# Patient Record
Sex: Female | Born: 1960 | Race: Black or African American | Hispanic: No | Marital: Single | Smoking: Former smoker
Health system: Southern US, Community
[De-identification: ages and names within clinical notes are randomized; demographics above are authoritative.]

## PROBLEM LIST (undated history)

## (undated) DIAGNOSIS — J449 Chronic obstructive pulmonary disease, unspecified: Secondary | ICD-10-CM

## (undated) DIAGNOSIS — I509 Heart failure, unspecified: Secondary | ICD-10-CM

## (undated) DIAGNOSIS — R911 Solitary pulmonary nodule: Secondary | ICD-10-CM

## (undated) DIAGNOSIS — I351 Nonrheumatic aortic (valve) insufficiency: Secondary | ICD-10-CM

## (undated) DIAGNOSIS — E213 Hyperparathyroidism, unspecified: Secondary | ICD-10-CM

## (undated) DIAGNOSIS — G629 Polyneuropathy, unspecified: Secondary | ICD-10-CM

## (undated) DIAGNOSIS — F32A Depression, unspecified: Secondary | ICD-10-CM

## (undated) DIAGNOSIS — I2510 Atherosclerotic heart disease of native coronary artery without angina pectoris: Secondary | ICD-10-CM

## (undated) DIAGNOSIS — I10 Essential (primary) hypertension: Secondary | ICD-10-CM

## (undated) DIAGNOSIS — E042 Nontoxic multinodular goiter: Secondary | ICD-10-CM

## (undated) DIAGNOSIS — E079 Disorder of thyroid, unspecified: Secondary | ICD-10-CM

## (undated) DIAGNOSIS — B1920 Unspecified viral hepatitis C without hepatic coma: Secondary | ICD-10-CM

## (undated) DIAGNOSIS — I340 Nonrheumatic mitral (valve) insufficiency: Secondary | ICD-10-CM

## (undated) HISTORY — PX: PARATHYROIDECTOMY: SHX19

## (undated) HISTORY — PX: TUBAL LIGATION: SHX77

---

## 2006-01-22 ENCOUNTER — Inpatient Hospital Stay: Payer: Self-pay | Admitting: Otolaryngology

## 2007-06-02 ENCOUNTER — Emergency Department: Payer: Self-pay | Admitting: Emergency Medicine

## 2007-06-02 ENCOUNTER — Other Ambulatory Visit: Payer: Self-pay

## 2007-06-13 ENCOUNTER — Ambulatory Visit: Payer: Self-pay | Admitting: General Practice

## 2009-12-18 ENCOUNTER — Ambulatory Visit: Payer: Self-pay | Admitting: Cardiovascular Disease

## 2010-01-07 ENCOUNTER — Ambulatory Visit: Payer: Self-pay | Admitting: Internal Medicine

## 2010-01-13 ENCOUNTER — Ambulatory Visit: Payer: Self-pay | Admitting: Internal Medicine

## 2010-03-09 ENCOUNTER — Emergency Department: Payer: Self-pay | Admitting: Unknown Physician Specialty

## 2011-03-07 ENCOUNTER — Emergency Department: Payer: Self-pay | Admitting: Emergency Medicine

## 2012-11-13 ENCOUNTER — Emergency Department: Payer: Self-pay | Admitting: Emergency Medicine

## 2013-09-21 ENCOUNTER — Inpatient Hospital Stay: Payer: Self-pay | Admitting: Internal Medicine

## 2013-09-21 LAB — DRUG SCREEN, URINE
Amphetamines, Ur Screen: NEGATIVE (ref ?–1000)
Benzodiazepine, Ur Scrn: NEGATIVE (ref ?–200)
Cannabinoid 50 Ng, Ur ~~LOC~~: NEGATIVE (ref ?–50)
Cocaine Metabolite,Ur ~~LOC~~: POSITIVE (ref ?–300)
MDMA (Ecstasy)Ur Screen: NEGATIVE (ref ?–500)
Opiate, Ur Screen: POSITIVE (ref ?–300)
Phencyclidine (PCP) Ur S: NEGATIVE (ref ?–25)
Tricyclic, Ur Screen: NEGATIVE (ref ?–1000)

## 2013-09-21 LAB — CBC WITH DIFFERENTIAL/PLATELET
Basophil #: 0.1 10*3/uL (ref 0.0–0.1)
Eosinophil #: 0.1 10*3/uL (ref 0.0–0.7)
Eosinophil %: 0.9 %
HCT: 42.4 % (ref 35.0–47.0)
Lymphocyte %: 15 %
MCH: 26.4 pg (ref 26.0–34.0)
Monocyte %: 5.8 %
Neutrophil %: 77.3 %
WBC: 7.3 10*3/uL (ref 3.6–11.0)

## 2013-09-21 LAB — COMPREHENSIVE METABOLIC PANEL
Anion Gap: 5 — ABNORMAL LOW (ref 7–16)
BUN: 14 mg/dL (ref 7–18)
Bilirubin,Total: 0.5 mg/dL (ref 0.2–1.0)
Chloride: 109 mmol/L — ABNORMAL HIGH (ref 98–107)
Co2: 26 mmol/L (ref 21–32)
Creatinine: 0.84 mg/dL (ref 0.60–1.30)
EGFR (African American): >60
EGFR (Non-African Amer.): >60
Osmolality: 282 (ref 275–301)
Potassium: 3.7 mmol/L (ref 3.5–5.1)
SGOT(AST): 16 U/L (ref 15–37)

## 2013-09-22 LAB — CBC WITH DIFFERENTIAL/PLATELET
Eosinophil %: 0.1 %
HCT: 36.7 % (ref 35.0–47.0)
HGB: 11.5 g/dL — ABNORMAL LOW (ref 12.0–16.0)
Lymphocyte #: 1.4 10*3/uL (ref 1.0–3.6)
MCH: 25.5 pg — ABNORMAL LOW (ref 26.0–34.0)
MCHC: 31.2 g/dL — ABNORMAL LOW (ref 32.0–36.0)
Monocyte #: 0.8 x10 3/mm (ref 0.2–0.9)
Neutrophil %: 74.4 %
Platelet: 213 10*3/uL (ref 150–440)
RBC: 4.49 10*6/uL (ref 3.80–5.20)
WBC: 8.5 10*3/uL (ref 3.6–11.0)

## 2013-09-22 LAB — TSH: Thyroid Stimulating Horm: 0.856 u[IU]/mL

## 2013-09-22 LAB — BASIC METABOLIC PANEL
Calcium, Total: 9.7 mg/dL (ref 8.5–10.1)
Co2: 26 mmol/L (ref 21–32)
Creatinine: 0.77 mg/dL (ref 0.60–1.30)
Glucose: 112 mg/dL — ABNORMAL HIGH (ref 65–99)
Osmolality: 280 (ref 275–301)
Potassium: 3.8 mmol/L (ref 3.5–5.1)

## 2013-09-23 LAB — SEDIMENTATION RATE: Erythrocyte Sed Rate: 20 mm/hr (ref 0–30)

## 2013-09-24 LAB — BASIC METABOLIC PANEL
BUN: 9 mg/dL (ref 7–18)
Chloride: 111 mmol/L — ABNORMAL HIGH (ref 98–107)
Co2: 27 mmol/L (ref 21–32)
Creatinine: 0.72 mg/dL (ref 0.60–1.30)
EGFR (Non-African Amer.): >60
Osmolality: 280 (ref 275–301)
Potassium: 3.9 mmol/L (ref 3.5–5.1)

## 2013-09-26 LAB — CULTURE, BLOOD (SINGLE)

## 2014-02-06 ENCOUNTER — Ambulatory Visit: Payer: Self-pay | Admitting: Internal Medicine

## 2014-02-17 ENCOUNTER — Ambulatory Visit: Payer: Self-pay | Admitting: Internal Medicine

## 2014-02-22 ENCOUNTER — Emergency Department: Payer: Self-pay | Admitting: Emergency Medicine

## 2014-02-25 LAB — PATHOLOGY REPORT

## 2014-12-26 ENCOUNTER — Emergency Department (HOSPITAL_COMMUNITY): Payer: Medicaid Other

## 2014-12-26 ENCOUNTER — Encounter (HOSPITAL_COMMUNITY): Payer: Self-pay | Admitting: Emergency Medicine

## 2014-12-26 ENCOUNTER — Emergency Department (HOSPITAL_COMMUNITY)
Admission: EM | Admit: 2014-12-26 | Discharge: 2014-12-26 | Disposition: A | Discharge status: Home / Self Care | Payer: Medicaid Other | Attending: Emergency Medicine | Admitting: Emergency Medicine

## 2014-12-26 DIAGNOSIS — J441 Chronic obstructive pulmonary disease with (acute) exacerbation: Secondary | ICD-10-CM | POA: Diagnosis not present

## 2014-12-26 DIAGNOSIS — R05 Cough: Secondary | ICD-10-CM | POA: Diagnosis present

## 2014-12-26 DIAGNOSIS — I10 Essential (primary) hypertension: Secondary | ICD-10-CM | POA: Insufficient documentation

## 2014-12-26 DIAGNOSIS — Z720 Tobacco use: Secondary | ICD-10-CM | POA: Diagnosis not present

## 2014-12-26 HISTORY — DX: Essential (primary) hypertension: I10

## 2014-12-26 HISTORY — DX: Chronic obstructive pulmonary disease, unspecified: J44.9

## 2014-12-26 LAB — I-STAT TROPONIN, ED: TROPONIN I, POC: 0 ng/mL (ref 0.00–0.08)

## 2014-12-26 MED ORDER — AMLODIPINE BESYLATE 5 MG PO TABS: 5.0000 mg | ORAL_TABLET | Freq: Once | ORAL | Status: DC

## 2014-12-26 MED ORDER — AMLODIPINE BESYLATE 5 MG PO TABS
5.0000 mg | ORAL_TABLET | Freq: Once | ORAL | Status: AC
  Administered 2014-12-26: 5 mg via ORAL
  Filled 2014-12-26: qty 1

## 2014-12-26 MED ORDER — PREDNISONE 20 MG PO TABS: 60.0000 mg | ORAL_TABLET | Freq: Every day | ORAL | Status: DC

## 2014-12-26 MED ORDER — AZITHROMYCIN 250 MG PO TABS
500.0000 mg | ORAL_TABLET | Freq: Once | ORAL | Status: AC
  Administered 2014-12-26: 500 mg via ORAL
  Filled 2014-12-26: qty 2

## 2014-12-26 MED ORDER — PREDNISONE 20 MG PO TABS
60.0000 mg | ORAL_TABLET | Freq: Once | ORAL | Status: AC
  Administered 2014-12-26: 60 mg via ORAL
  Filled 2014-12-26: qty 3

## 2014-12-26 MED ORDER — AZITHROMYCIN 250 MG PO TABS: 250.0000 mg | ORAL_TABLET | Freq: Every day | ORAL | Status: DC

## 2014-12-26 NOTE — ED Notes (Signed)
Pt arrives with c/o URI ongoing for 3-4 weeks, states cough has become progressively worse that "its affecting my groin." Thick, green sputum. COPD hx.

## 2014-12-26 NOTE — Discharge Instructions (Signed)
Hypertension Cindy Gutierrez, Take z-pack and steroids as prescribed and follow up with a primary care physician within 3 days.  Take amlodipine as well for your high blood pressure.  If symptoms worsen, come back to the ED immediately.  Thank you. Hypertension is another name for high blood pressure. High blood pressure forces your heart to work harder to pump blood. A blood pressure reading has two numbers, which includes a higher number over a lower number (example: 110/72). HOME CARE   Have your blood pressure rechecked by your doctor.  Only take medicine as told by your doctor. Follow the directions carefully. The medicine does not work as well if you skip doses. Skipping doses also puts you at risk for problems.  Do not smoke.  Monitor your blood pressure at home as told by your doctor. GET HELP IF:  You think you are having a reaction to the medicine you are taking.  You have repeat headaches or feel dizzy.  You have puffiness (swelling) in your ankles.  You have trouble with your vision. GET HELP RIGHT AWAY IF:   You get a very bad headache and are confused.  You feel weak, numb, or faint.  You get chest or belly (abdominal) pain.  You throw up (vomit).  You cannot breathe very well. MAKE SURE YOU:   Understand these instructions.  Will watch your condition.  Will get help right away if you are not doing well or get worse. Document Released: 03/07/2008 Document Revised: 09/24/2013 Document Reviewed: 07/12/2013 [facility] Patient Information 2015 [place], [place]. This information is not intended to replace advice given to you by your health care provider. Make sure you discuss any questions you have with your health care provider. Chronic Obstructive Pulmonary Disease Chronic obstructive pulmonary disease (COPD) is a common lung problem. In COPD, the flow of air from the lungs is limited. The way your lungs work will probably never return to normal, but there are things you  can do to improve your lungs and make yourself feel better. HOME CARE  Take all medicines as told by your doctor.  Avoid medicines or cough syrups that dry up your airway (such as antihistamines) and do not allow you to get rid of thick spit. You do not need to avoid them if told differently by your doctor.  If you smoke, stop. Smoking makes the problem worse.  Avoid being around things that make your breathing worse (like smoke, chemicals, and fumes).  Use oxygen therapy and therapy to help improve your lungs (pulmonary rehabilitation) if told by your doctor. If you need home oxygen therapy, ask your doctor if you should buy a tool to measure your oxygen level (oximeter).  Avoid people who have a sickness you can catch (contagious).  Avoid going outside when it is very hot, cold, or humid.  Eat healthy foods. Eat smaller meals more often. Rest before meals.  Stay active, but remember to also rest.  Make sure to get all the shots (vaccines) your doctor recommends. Ask your doctor if you need a pneumonia shot.  Learn and use tips on how to relax.  Learn and use tips on how to control your breathing as told by your doctor. Try:  Breathing in (inhaling) through your nose for 1 second. Then, pucker your lips and breath out (exhale) through your lips for 2 seconds.  Putting one hand on your belly (abdomen). Breathe in slowly through your nose for 1 second. Your hand on your belly should move  out. Pucker your lips and breathe out slowly through your lips. Your hand on your belly should move in as you breathe out.  Learn and use controlled coughing to clear thick spit from your lungs. The steps are:  Lean your head a little forward.  Breathe in deeply.  Try to hold your breath for 3 seconds.  Keep your mouth slightly open while coughing 2 times.  Spit any thick spit out into a tissue.  Rest and do the steps again 1 or 2 times as needed. GET HELP IF:  You cough up more thick  spit than usual.  There is a change in the color or thickness of the spit.  It is harder to breathe than usual.  Your breathing is faster than usual. GET HELP RIGHT AWAY IF:   You have shortness of breath while resting.  You have shortness of breath that stops you from:  Being able to talk.  Doing normal activities.  You chest hurts for longer than 5 minutes.  Your skin color is more blue than usual.  Your pulse oximeter shows that you have low oxygen for longer than 5 minutes. MAKE SURE YOU:   Understand these instructions.  Will watch your condition.  Will get help right away if you are not doing well or get worse. Document Released: 03/07/2008 Document Revised: 02/03/2014 Document Reviewed: 05/16/2013 [facility] Patient Information 2015 [place], [place]. This information is not intended to replace advice given to you by your health care provider. Make sure you discuss any questions you have with your health care provider.

## 2014-12-26 NOTE — ED Provider Notes (Signed)
CSN: [id number]     Arrival date & time 12/26/14  0455 History   First MD Initiated Contact with Patient 12/26/14 [phone]     Chief Complaint  Patient presents with  . Cough     (Consider location/radiation/quality/duration/timing/severity/associated sxs/prior Treatment) HPI [name] is a 54 year old female with a past medical history of hypertension and COPD presented today with chronic cough.  She states her cough has been persistent and worsening for the last 3-4 weeks.  It is productive of thick green sputum.  She has intermittent subj fevers as well.  She denies any chest pain at this time. She has been using her maintenance medication and nebulizer albuterol at home without relief. Patient with no further complaints.   10 Systems reviewed and are negative for acute change except as noted in the HPI.    Past Medical History  Diagnosis Date  . COPD (chronic obstructive pulmonary disease)   . Hypertension    History reviewed. No pertinent past surgical history. No family history on file. History  Substance Use Topics  . Smoking status: Current Every Day Smoker  . Smokeless tobacco: Not on file  . Alcohol Use: Yes   OB History    No data available     Review of Systems    Allergies  Review of patient's allergies indicates no known allergies.  Home Medications   Prior to Admission medications   Not on File   BP 200/72 mmHg  Pulse 87  Temp(Src) 98.9 F (37.2 C) (Oral)  Resp 18  Ht 5\' 10"  (1.778 m)  Wt 165 lb (74.844 kg)  BMI 23.68 kg/m2  SpO2 99% Physical Exam  Constitutional: She is oriented to person, place, and time. She appears well-developed and well-nourished. No distress.  HENT:  Head: Normocephalic and atraumatic.  Nose: Nose normal.  Mouth/Throat: Oropharynx is clear and moist. No oropharyngeal exudate.  Eyes: Conjunctivae and EOM are normal. Pupils are equal, round, and reactive to light. No scleral icterus.  Neck: Normal range of motion. Neck  supple. No JVD present. No tracheal deviation present. No thyromegaly present.  Cardiovascular: Normal rate, regular rhythm and normal heart sounds.  Exam reveals no gallop and no friction rub.   No murmur heard. Pulmonary/Chest: Effort normal and breath sounds normal. No respiratory distress. She has no wheezes. She exhibits no tenderness.  Abdominal: Soft. Bowel sounds are normal. She exhibits no distension and no mass. There is no tenderness. There is no rebound and no guarding.  Musculoskeletal: Normal range of motion. She exhibits no edema or tenderness.  Lymphadenopathy:    She has no cervical adenopathy.  Neurological: She is alert and oriented to person, place, and time. No cranial nerve deficit. She exhibits normal muscle tone.  Skin: Skin is warm and dry. No rash noted. She is not diaphoretic. No erythema. No pallor.  Nursing note and vitals reviewed.   ED Course  Procedures (including critical care time) Labs Review Labs Reviewed  Randolm Idol, ED    Imaging Review Dg Chest 2 View (if Patient Has Fever And/or Copd)  12/26/2014   CLINICAL DATA:  Cough for 3-4 weeks.  History of COPD.  EXAM: CHEST  2 VIEW  COMPARISON:  11/13/2012  FINDINGS: Mild hyperinflation and interstitial prominence. The cardiomediastinal contours are normal. Pulmonary vasculature is normal. No consolidation, pleural effusion, or pneumothorax. No acute osseous abnormalities are seen. Old left-sided rib fracture.  IMPRESSION: Mild hyperinflation and interstitial prominence. This is likely chronic, however has progressed from examination  2 years prior.   Electronically Signed   By: Jeb Levering M.D.   On: 12/26/2014 05:59     EKG Interpretation None      MDM   Final diagnoses:  None   Patient presents to the ED for mild SOB and increaseing and worsening cough. She states this is consistent with her COPD exacerbation.  Will obtain cxr to eval for pneumonia.  Patient was given steroids in the ED.   There is no wheezing on exam.  Her BP is very high and she states this is normal and she does not take meds.  I had a lengthy discussion with her and she agreed tos tart lose dose amlodipine and follow up with a PCP within 3 days for continued management.    CXR does not show any pneumonia.  Will also give azithromycin for COPD exac.  Her VS remain within her normal limits and she is safe for DC.      Everlene Balls, MD 12/26/14 [phone]

## 2015-01-23 NOTE — H&P (Signed)
PATIENT NAME:  Cindy Gutierrez, Cindy Gutierrez MR#:  [id number] DATE OF BIRTH:  [birth year]  DATE OF ADMISSION:  09/21/2013  REFERRING PHYSICIAN: Dr. Marjean Donna.   PRIMARY CARE PHYSICIAN: Dr. Lamonte Sakai.   CHIEF COMPLAINT: Diffuse rash and pain.  HISTORY OF PRESENT ILLNESS: This is a 54 year old African American female with past medical history of chronic obstructive pulmonary disease, hypertension, taking no medications, presenting with diffuse rash. She describes one day duration of diffuse rash. This, she believes,  is secondary to bug bites after riding in her friend's car. She describes this rash as painful, however not pleuritic. She also notes swelling around her right eye without associated eye pain, visual deficits or problems with bright lights. On arrival, she was found to be markedly hypertensive with systolic blood pressures in the 230s. Denies any fevers, chills, no further symptoms.   REVIEW OF SYSTEMS:  CONSTITUTIONAL: Denies fever, fatigue, weakness.  EYES: Denies blurred vision, double vision, eye pain.  ENT: Denies tinnitus, ear pain, hearing loss.  RESPIRATORY: Denies cough, wheeze, shortness of breath.  CARDIOVASCULAR: Denies chest pain, palpitations, edema.  GASTROINTESTINAL: Denies nausea, vomiting, diarrhea, abdominal pain. GENITOURINARY: Denies dysuria, hematuria.  ENDOCRINE: Denies nocturia or thyroid problems.  HEMATOLOGY AND LYMPHATIC: Denies easy bruising or bleeding.  SKIN: Positive for rash described above.  MUSCULOSKELETAL: Complains of diffuse pain, though most prominent in hands bilaterally, the palmar surfaces. Denies any arthritic symptoms.  NEUROLOGIC: Denies paralysis, paresthesias.  PSYCHIATRIC: Denies any anxiety or depressive symptoms.  Otherwise, full review of systems performed by me is negative.   PAST MEDICAL HISTORY: Hypertension, as well as chronic obstructive pulmonary disease.   SOCIAL HISTORY: Every day tobacco use, occasional alcohol usage. Has a  history of IV drug usage, though states not for many years.    FAMILY HISTORY: Positive for diabetes as well as hypertension.   ALLERGIES: PENICILLIN AND SULFA.   HOME MEDICATIONS: None.   PHYSICAL EXAMINATION: VITAL SIGNS: Temperature 98.1, heart rate 84, respirations 18, blood pressure 239/102, saturating 97% on room air. Weight 74.8 kg, BMI 23.7.  GENERAL: Well-nourished, well-developed, African American female, currently in no acute distress.  HEAD: Normocephalic, atraumatic.  EYES: Pupils equal, round and reactive to light. Extraocular movements intact. No scleral icterus. There is marked edema and erythema around the right eye.  EARS, NOSE, AND THROAT: Throat clear, without exudates. No external lesions.  NECK: Supple. No thyromegaly. No nodules. No JVD.  PULMONARY: Clear to auscultation bilaterally. No wheezes, rales, rhonchi. No use of accessory muscles. Good respiratory effort.  CHEST: Nontender to palpation.  CARDIOVASCULAR: S1, S2, regular rate and rhythm. No murmurs, rubs, or gallops. No edema. Pedal pulses 2+ bilaterally.  ABDOMEN: Soft, nontender, nondistended. No masses. Positive bowel sounds. No hepatosplenomegaly.  MUSCULOSKELETAL: No swelling, clubbing, edema. Range of motion full in all extremities.  NEUROLOGIC: Cranial nerves II through XII intact. No gross focal neurological deficits. Sensation intact. Reflexes intact.  SKIN: Diffuse erythematous papules, most prominent over the forearms, including the hands as well as the upper thighs. No cyanosis. Skin warm and dry. Turgor is intact.  PSYCHIATRIC: Mood and affect within normal limits. Awake, alert, and oriented x3. Insight and judgment intact.   LABORATORY DATA: Sodium 140, potassium 3.7, chloride 109, bicarbonate 26, BUN 14, creatinine 0.82, glucose 132, calcium 10.4. LFTs: Alkaline phosphatase 139; otherwise within normal limits. WBC 7.3, hemoglobin 13.7, platelets 231.   ASSESSMENT AND PLAN: A 54 year old [place]  American female with history of hypertension, chronic obstructive pulmonary disease, presenting with diffuse  rash.  1.  Hypertensive urgency. Give p.r.n. IV hydralazine. Pain likely also plays a role for uncontrolled blood pressure. We will follow her blood pressure trend. She will likely need additional p.o. agents once her pain is controlled.  2.  Preseptal cellulitis. Continue with clindamycin 600 mg IV q.8h.  3.  Diffuse rash of unclear etiology. Provide pain control. She denies any pruritus. May need dermatology input.  4.  Hypercalcemia. IV fluid hydration.  5.  Deep venous thrombosis prophylaxis.  CODE STATUS: Patient is full code.   TIME SPENT: 45 minutes.     ____________________________ Aaron Mose. Hower, MD dkh:cg D: 09/21/2013 01:36:24 ET T: 09/21/2013 03:10:16 ET JOB#: [id number]  cc: Aaron Mose. Hower, MD, <Dictator> DAVID Woodfin Ganja MD ELECTRONICALLY SIGNED 09/22/2013 1:54

## 2015-01-24 NOTE — Discharge Summary (Signed)
PATIENT NAME:  Cindy Gutierrez, Cindy Gutierrez MR#:  [id number] DATE OF BIRTH:  [birth year]  DATE OF ADMISSION:  09/21/2013 DATE OF DISCHARGE:  09/25/2013  ADMITTING PHYSICIAN:  Dr. Valentino Nose.  DISCHARGE PHYSICIAN:  Dr. Elijio Miles.   DISCHARGE DIAGNOSES:  1.  Accelerated hypertension.  2.  Cocaine and polysubstance abuse.  3.  Cellulitis.  4.  Purpura.   PROCEDURES:  None.   HOSPITAL COURSE:  This 54 year old lady was admitted through the Emergency Room where she presented with diffuse rash, arthralgia and accelerated hypertension. She was found on urine drug screen to have abused cocaine and opiates. She states that she developed diffuse rash and arthralgia after traveling in a friend'Cindy Gutierrez car. Please refer to the history and physical for full details. She was found to have preseptal cellulitis, a diffuse rash of unclear etiology and was admitted subsequently. Blood pressure was treated with IV hydralazine and gradually improved. Her rash and arthralgia also gradually improved. I ordered inflammatory markers, which were negative. I also initiated a vasculitis workup which is still pending at the time of my dictation. However, given her history of cocaine abuse, I suspect that was the etiology of her purpura. The patient'Cindy Gutierrez blood pressure improved considerably. Her arthralgia also resolved by the discharge. The patient was counseled on substance abuse and has agreed to seek counseling following discharge.   DISCHARGE MEDICATIONS: 1.  Ibuprofen 800 mg q.8 hours p.r.n. 2.  Lotrel 5/20 daily.  3.  Benadryl 25 mg p.r.n. q.8 hours. 4.  Clindamycin 600 mg every 8 hours.    DISCHARGE INSTRUCTIONS: DIET:  Low sodium.   ACTIVITY LIMITATIONS:  None.   FOLLOWUP:  In 1 to 2 weeks with Dr. Elijio Miles.  DISCHARGE PROCESS TIME SPENT:  35 minutes.  ____________________________ Venetia Maxon Elijio Miles, MD sat:jm D: 09/26/2013 13:25:30 ET T: 09/26/2013 17:30:07 ET JOB#: [id number]  cc: Sheikh A. Elijio Miles, MD,  <Dictator> Veverly Fells MD ELECTRONICALLY SIGNED 10/12/2013 10:32

## 2015-07-26 ENCOUNTER — Emergency Department
Admission: EM | Admit: 2015-07-26 | Discharge: 2015-07-26 | Disposition: A | Discharge status: Home / Self Care | Payer: Medicaid Other | Attending: Emergency Medicine | Admitting: Emergency Medicine

## 2015-07-26 DIAGNOSIS — R05 Cough: Secondary | ICD-10-CM | POA: Diagnosis present

## 2015-07-26 DIAGNOSIS — Z880 Allergy status to penicillin: Secondary | ICD-10-CM | POA: Diagnosis not present

## 2015-07-26 DIAGNOSIS — Z720 Tobacco use: Secondary | ICD-10-CM | POA: Insufficient documentation

## 2015-07-26 DIAGNOSIS — I10 Essential (primary) hypertension: Secondary | ICD-10-CM | POA: Insufficient documentation

## 2015-07-26 DIAGNOSIS — Z79899 Other long term (current) drug therapy: Secondary | ICD-10-CM | POA: Diagnosis not present

## 2015-07-26 DIAGNOSIS — J219 Acute bronchiolitis, unspecified: Secondary | ICD-10-CM

## 2015-07-26 DIAGNOSIS — J449 Chronic obstructive pulmonary disease, unspecified: Secondary | ICD-10-CM | POA: Diagnosis not present

## 2015-07-26 MED ORDER — PREDNISONE 20 MG PO TABS
40.0000 mg | ORAL_TABLET | Freq: Once | ORAL | Status: AC
  Administered 2015-07-26: 40 mg via ORAL
  Filled 2015-07-26: qty 2

## 2015-07-26 MED ORDER — PREDNISONE 20 MG PO TABS: 40.0000 mg | ORAL_TABLET | Freq: Once | ORAL | Status: DC

## 2015-07-26 MED ORDER — AZITHROMYCIN 250 MG PO TABS: ORAL_TABLET | ORAL | Status: DC

## 2015-07-26 MED ORDER — BUDESONIDE-FORMOTEROL FUMARATE 80-4.5 MCG/ACT IN AERO: 2.0000 | INHALATION_SPRAY | Freq: Two times a day (BID) | RESPIRATORY_TRACT | Status: DC

## 2015-07-26 MED ORDER — AZITHROMYCIN 250 MG PO TABS: 500.0000 mg | ORAL_TABLET | Freq: Once | ORAL | Status: DC

## 2015-07-26 MED ORDER — ALBUTEROL SULFATE HFA 108 (90 BASE) MCG/ACT IN AERS: 2.0000 | INHALATION_SPRAY | Freq: Four times a day (QID) | RESPIRATORY_TRACT | Status: DC | PRN

## 2015-07-26 MED ORDER — PREDNISONE 20 MG PO TABS: 40.0000 mg | ORAL_TABLET | Freq: Every day | ORAL | Status: DC

## 2015-07-26 MED ORDER — AZITHROMYCIN 250 MG PO TABS
500.0000 mg | ORAL_TABLET | Freq: Once | ORAL | Status: AC
  Administered 2015-07-26: 500 mg via ORAL
  Filled 2015-07-26: qty 2

## 2015-07-26 NOTE — ED Notes (Signed)
Pt c/o cough with congestion for the past 3 weeks and seemed to try and get better but the cough just will not clear up.;.states I just need a zpack and steroids.

## 2015-07-26 NOTE — ED Notes (Signed)
Pt to ed with c/o cough and congestion x3 weeks, a&ox3 nadn at this time

## 2015-07-26 NOTE — ED Provider Notes (Signed)
[facility] Emergency Department Provider Note REMINDER - THIS NOTE IS NOT A FINAL MEDICAL RECORD UNTIL IT IS SIGNED. UNTIL THEN, THE CONTENT BELOW MAY REFLECT INFORMATION FROM A DOCUMENTATION TEMPLATE, NOT THE ACTUAL PATIENT VISIT. ____________________________________________  Time seen: Approximately 5:27 PM  I have reviewed the triage vital signs and the nursing notes.   HISTORY  Chief Complaint Cough    HPI Cindy Gutierrez is a 54 y.o. female the history of COPD, still smoking. She reports that for the last 3 weeks she has had a cough that is just not been clearing up, occasionally productive, and occasionally she'll have some slight chills but no fever. She reports that she is breathing okay but this cough just won't improve. Not having any chest pain. She does not feel short of breath and does not report wheezing except she does use her albuterol occasionally at home which she clears her symptoms.  She reports she had approximately same symptoms in February and that they cleared up after a Z-Pak and prednisone which she is requesting today. She does have a primary care doctor whom she follows with, but states she would prefer come to the ER for convenience.   Past Medical History  Diagnosis Date  . COPD (chronic obstructive pulmonary disease) ([place])   . Hypertension     There are no active problems to display for this patient.   Past Surgical History  Procedure Laterality Date  . Tubal ligation      Current Outpatient Rx  Name  Route  Sig  Dispense  Refill  . albuterol (PROVENTIL HFA;VENTOLIN HFA) 108 (90 BASE) MCG/ACT inhaler   Inhalation   Inhale 2 puffs into the lungs every 6 (six) hours as needed for wheezing or shortness of breath.   1 Inhaler   2   . amLODipine (NORVASC) 5 MG tablet   Oral   Take 1 tablet (5 mg total) by mouth once.   30 tablet   0   . azithromycin (ZITHROMAX Z-PAK) 250 MG tablet      Take 1 tab daily, start  07/26/15   4 each   0   . budesonide-formoterol (SYMBICORT) 80-4.5 MCG/ACT inhaler   Inhalation   Inhale 2 puffs into the lungs 2 (two) times daily.   1 Inhaler   1   . predniSONE (DELTASONE) 20 MG tablet   Oral   Take 2 tablets (40 mg total) by mouth daily.   10 tablet   0     Allergies Bee pollen; Penicillins; and Sulfa antibiotics  No family history on file.  Social History Social History  Substance Use Topics  . Smoking status: Current Every Day Smoker  . Smokeless tobacco: None  . Alcohol Use: Yes    Review of Systems Constitutional: chills Eyes: No visual changes. ENT: No sore throat. Cardiovascular: Denies chest pain. Respiratory: Denies shortness of breath except on occasion when albuterol improves it. No trouble breathing or wheezing. Gastrointestinal: No abdominal pain.  No nausea, no vomiting.  No diarrhea.  No constipation. Genitourinary: Negative for dysuria. Musculoskeletal: Negative for back pain. Skin: Negative for rash. Neurological: Negative for headaches, focal weakness or numbness.  10-point ROS otherwise negative.  ____________________________________________   PHYSICAL EXAM:  VITAL SIGNS: ED Triage Vitals  Enc Vitals Group     BP 07/26/15 1712 162/85 mmHg     Pulse Rate 07/26/15 1712 84     Resp 07/26/15 1712 20     Temp 07/26/15 1712 98.1  F (36.7 C)     Temp Source 07/26/15 1712 Oral     SpO2 07/26/15 1712 97 %     Weight 07/26/15 1712 151 lb (68.493 kg)     Height 07/26/15 1712 5\' 10"  (1.778 m)     Head Cir --      Peak Flow --      Pain Score --      Pain Loc --      Pain Edu? --      Excl. in [place]? --    Constitutional: Alert and oriented. Well appearing and in no acute distress. Speaking in full sentences with no distress. She denies any pain. Occasional dry nonproductive cough. Eyes: Conjunctivae are normal. PERRL. EOMI. Head: Atraumatic. Nose: No congestion/rhinnorhea. Mouth/Throat: Mucous membranes are moist.   Oropharynx non-erythematous. Neck: No stridor.   Cardiovascular: Normal rate, regular rhythm. Grossly normal heart sounds.  Good peripheral circulation. Respiratory: Normal respiratory effort.  No retractions. Lungs CTAB. Speaks full sentences. Absolute no wheezing or signs of increased work of breathing. Gastrointestinal: Soft and nontender. No distention. No abdominal bruits. No CVA tenderness. Musculoskeletal: No lower extremity tenderness nor edema.  No joint effusions. Neurologic:  Normal speech and language. No gross focal neurologic deficits are appreciated. No gait instability. Skin:  Skin is warm, dry and intact. No rash noted. Psychiatric: Mood and affect are normal. Speech and behavior are normal.  ____________________________________________   LABS (all labs ordered are listed, but only abnormal results are displayed)  Labs Reviewed - No data to display ____________________________________________  EKG   ____________________________________________  RADIOLOGY  Not indicated, patient normal SPO2, afebrile, clear lungs bilaterally. ____________________________________________   PROCEDURES  Procedure(s) performed: None  Critical Care performed: No  ____________________________________________   INITIAL IMPRESSION / ASSESSMENT AND PLAN / ED COURSE  Pertinent labs & imaging results that were available during my care of the patient were reviewed by me and considered in my medical decision making (see chart for details).  Patient percent for 3 weeks of cough, occasionally productive without fever. Very reassuring exam, she is still an active smoker ninth discussed that with her but she endorses she will continue smoking. I will prescribe her prednisone and azithromycin for what is likely bronchitis with a mild possible COPD exacerbation though in no distress. She has no acute cardiac symptoms. She is awake alert with stable vital signs. I will also refill her albuterol  and Symbicort prescriptions which she requested that she will be running low on soon.  Return precautions advised. Patient agreeable and will follow up with her primary care doctor, she'll return to the ER if she develops a fever, worsening shortness of breath, chest pain, trouble breathing, or other new concerns arise. ____________________________________________   FINAL CLINICAL IMPRESSION(S) / ED DIAGNOSES  Final diagnoses:  Acute bronchiolitis due to unspecified organism      Delman Kitten, MD 07/26/15 1740

## 2015-07-26 NOTE — Discharge Instructions (Signed)
We believe that your symptoms are caused today by an exacerbation of your COPD, and possibly bronchitis.  Please take the prescribed medications and any medications that you have at home for your COPD.  Follow up with your doctor as recommended.  If you develop any new or worsening symptoms, including but not limited to fever, persistent vomiting, worsening shortness of breath, or other symptoms that concern you, please return to the Emergency Department immediately. ° °

## 2015-09-24 ENCOUNTER — Other Ambulatory Visit: Payer: Self-pay | Admitting: Internal Medicine

## 2015-09-24 DIAGNOSIS — N63 Unspecified lump in unspecified breast: Secondary | ICD-10-CM

## 2015-10-06 ENCOUNTER — Other Ambulatory Visit: Payer: Medicaid Other

## 2015-10-06 ENCOUNTER — Ambulatory Visit: Payer: Medicaid Other | Attending: Internal Medicine

## 2015-11-19 ENCOUNTER — Emergency Department: Payer: Medicaid Other

## 2015-11-19 ENCOUNTER — Encounter: Payer: Self-pay | Admitting: Emergency Medicine

## 2015-11-19 ENCOUNTER — Emergency Department
Admission: EM | Admit: 2015-11-19 | Discharge: 2015-11-19 | Disposition: A | Discharge status: Home / Self Care | Payer: Medicaid Other | Attending: Emergency Medicine | Admitting: Emergency Medicine

## 2015-11-19 DIAGNOSIS — J441 Chronic obstructive pulmonary disease with (acute) exacerbation: Secondary | ICD-10-CM | POA: Insufficient documentation

## 2015-11-19 DIAGNOSIS — F17210 Nicotine dependence, cigarettes, uncomplicated: Secondary | ICD-10-CM | POA: Diagnosis not present

## 2015-11-19 DIAGNOSIS — J069 Acute upper respiratory infection, unspecified: Secondary | ICD-10-CM | POA: Insufficient documentation

## 2015-11-19 DIAGNOSIS — R0602 Shortness of breath: Secondary | ICD-10-CM | POA: Diagnosis present

## 2015-11-19 DIAGNOSIS — I10 Essential (primary) hypertension: Secondary | ICD-10-CM | POA: Insufficient documentation

## 2015-11-19 DIAGNOSIS — Z880 Allergy status to penicillin: Secondary | ICD-10-CM | POA: Diagnosis not present

## 2015-11-19 DIAGNOSIS — Z7951 Long term (current) use of inhaled steroids: Secondary | ICD-10-CM | POA: Insufficient documentation

## 2015-11-19 DIAGNOSIS — Z79899 Other long term (current) drug therapy: Secondary | ICD-10-CM | POA: Diagnosis not present

## 2015-11-19 LAB — COMPREHENSIVE METABOLIC PANEL
ALBUMIN: 4 g/dL (ref 3.5–5.0)
ALK PHOS: 92 U/L (ref 38–126)
ALT: 22 U/L (ref 14–54)
ANION GAP: 6 (ref 5–15)
AST: 25 U/L (ref 15–41)
BUN: 9 mg/dL (ref 6–20)
CHLORIDE: 106 mmol/L (ref 101–111)
CO2: 29 mmol/L (ref 22–32)
Calcium: 10.5 mg/dL — ABNORMAL HIGH (ref 8.9–10.3)
Creatinine, Ser: 0.75 mg/dL (ref 0.44–1.00)
GFR calc non Af Amer: >60 mL/min (ref >60)
GLUCOSE: 113 mg/dL — AB (ref 65–99)
POTASSIUM: 3.7 mmol/L (ref 3.5–5.1)
SODIUM: 141 mmol/L (ref 135–145)
Total Bilirubin: 0.7 mg/dL (ref 0.3–1.2)
Total Protein: 7.6 g/dL (ref 6.5–8.1)

## 2015-11-19 LAB — CBC
HCT: 42.3 % (ref 35.0–47.0)
HEMOGLOBIN: 14.1 g/dL (ref 12.0–16.0)
MCH: 26.9 pg (ref 26.0–34.0)
MCHC: 33.4 g/dL (ref 32.0–36.0)
MCV: 80.4 fL (ref 80.0–100.0)
PLATELETS: 212 10*3/uL (ref 150–440)
RBC: 5.27 MIL/uL — ABNORMAL HIGH (ref 3.80–5.20)
RDW: 13.6 % (ref 11.5–14.5)
WBC: 4.5 10*3/uL (ref 3.6–11.0)

## 2015-11-19 MED ORDER — PREDNISONE 20 MG PO TABS: 60.0000 mg | ORAL_TABLET | Freq: Every day | ORAL | Status: DC

## 2015-11-19 MED ORDER — AZITHROMYCIN 500 MG PO TABS
500.0000 mg | ORAL_TABLET | Freq: Once | ORAL | Status: AC
  Administered 2015-11-19: 500 mg via ORAL
  Filled 2015-11-19: qty 1

## 2015-11-19 MED ORDER — AZITHROMYCIN 250 MG PO TABS: ORAL_TABLET | ORAL | Status: AC

## 2015-11-19 MED ORDER — PREDNISONE 20 MG PO TABS
60.0000 mg | ORAL_TABLET | Freq: Once | ORAL | Status: AC
  Administered 2015-11-19: 60 mg via ORAL
  Filled 2015-11-19: qty 3

## 2015-11-19 MED ORDER — IPRATROPIUM-ALBUTEROL 0.5-2.5 (3) MG/3ML IN SOLN
9.0000 mL | Freq: Once | RESPIRATORY_TRACT | Status: AC
  Administered 2015-11-19: 9 mL via RESPIRATORY_TRACT
  Filled 2015-11-19: qty 9

## 2015-11-19 NOTE — ED Provider Notes (Addendum)
[facility] Emergency Department Provider Note  ____________________________________________  Time seen: Approximately [id number] PM  I have reviewed the triage vital signs and the nursing notes.   HISTORY  Chief Complaint Shortness of Breath    HPI Cindy Gutierrez is a 55 y.o. female with a history of COPD who is presenting today with 2 days of shortness of breath cough and fever. She says that the symptoms started 2-3 days ago and have been worsening. She says that she has had multiple episodes of this in the past which have been resolved with prednisone and Z-Pak. She says that she is a known sick contact of her neighbor. Denies any chest pain. Has taken DayQuil today with minimal relief of her symptoms.   Past Medical History  Diagnosis Date  . COPD (chronic obstructive pulmonary disease) ([place])   . Hypertension     There are no active problems to display for this patient.   Past Surgical History  Procedure Laterality Date  . Tubal ligation      Current Outpatient Rx  Name  Route  Sig  Dispense  Refill  . albuterol (PROVENTIL HFA;VENTOLIN HFA) 108 (90 BASE) MCG/ACT inhaler   Inhalation   Inhale 2 puffs into the lungs every 6 (six) hours as needed for wheezing or shortness of breath.   1 Inhaler   2   . amLODipine (NORVASC) 5 MG tablet   Oral   Take 1 tablet (5 mg total) by mouth once.   30 tablet   0   . azithromycin (ZITHROMAX Z-PAK) 250 MG tablet      Take 1 tab daily, start 07/26/15   4 each   0   . budesonide-formoterol (SYMBICORT) 80-4.5 MCG/ACT inhaler   Inhalation   Inhale 2 puffs into the lungs 2 (two) times daily.   1 Inhaler   1   . predniSONE (DELTASONE) 20 MG tablet   Oral   Take 2 tablets (40 mg total) by mouth daily.   10 tablet   0     Allergies Bee pollen; Penicillins; and Sulfa antibiotics  No family history on file.  Social History Social History  Substance Use Topics  . Smoking status: Current Every Day  Smoker -- 0.50 packs/day    Types: Cigarettes  . Smokeless tobacco: None  . Alcohol Use: Yes    Review of Systems Constitutional:  fever/chills Eyes: No visual changes. ENT: No sore throat. Cardiovascular: Denies chest pain. Respiratory: As above Gastrointestinal: No abdominal pain.  No nausea, no vomiting.  No diarrhea.  No constipation. Genitourinary: Negative for dysuria. Musculoskeletal: Negative for back pain. Skin: Negative for rash. Neurological: Negative for focal weakness or numbness.  10-point ROS otherwise negative.  ____________________________________________   PHYSICAL EXAM:  VITAL SIGNS: ED Triage Vitals  Enc Vitals Group     BP 11/19/15 1757 166/82 mmHg     Pulse Rate 11/19/15 1757 104     Resp 11/19/15 1757 18     Temp 11/19/15 1757 99.1 F (37.3 C)     Temp Source 11/19/15 1757 Oral     SpO2 11/19/15 1757 96 %     Weight 11/19/15 1754 151 lb (68.493 kg)     Height 11/19/15 1754 5\' 10"  (1.778 m)     Head Cir --      Peak Flow --      Pain Score --      Pain Loc --      Pain Edu? --  Excl. in [place]? --     Constitutional: Alert and oriented. Well appearing and in no acute distress. Eyes: Conjunctivae are normal. PERRL. EOMI. Head: Atraumatic. Nose: Lateral clear rhinorrhea Mouth/Throat: Mucous membranes are moist.  Oropharynx non-erythematous. Neck: No stridor.   Cardiovascular: Normal rate, regular rhythm. Grossly normal heart sounds.  Good peripheral circulation. Respiratory: Normal respiratory effort.  No retractions. Lungs CTAB. Mildly prolonged expiratory phase with expiratory cough. Gastrointestinal: Soft and nontender. No distention. No abdominal bruits. No CVA tenderness. Musculoskeletal: No lower extremity tenderness nor edema.  No joint effusions. Neurologic:  Normal speech and language. No gross focal neurologic deficits are appreciated. No gait instability. Skin:  Skin is warm, dry and intact. No rash noted. Psychiatric: Mood and  affect are normal. Speech and behavior are normal.  ____________________________________________   LABS (all labs ordered are listed, but only abnormal results are displayed)  Labs Reviewed  CBC - Abnormal; Notable for the following:    RBC 5.27 (*)    All other components within normal limits  COMPREHENSIVE METABOLIC PANEL   ____________________________________________  EKG  ED ECG REPORT I, Aras Albarran,  Youlanda Roys, the attending physician, personally viewed and interpreted this ECG.   Date: 11/19/2015  EKG Time: 1806  Rate: 91  Rhythm: normal sinus rhythm  Axis: Normal axis  Intervals:Incomplete right bundle-branch block  ST&T Change: No ST segment elevation or depression. No abnormal T-wave inversion. No change from the EKG of 09/21/2013.  ____________________________________________  RADIOLOGY  No active cardiopulmonary disease on the chest x-ray. ____________________________________________   PROCEDURES   ____________________________________________   INITIAL IMPRESSION / ASSESSMENT AND PLAN / ED COURSE  Pertinent labs & imaging results that were available during my care of the patient were reviewed by me and considered in my medical decision making (see chart for details). .----------------------------------------- 7:35 PM on 11/19/2015 -----------------------------------------   Patient says she feels improved after breathing treatments and steroids. We'll discharge home with Z-Pak and steroids.  Patient says that she has Ventolin at home ____________________________________________   FINAL CLINICAL IMPRESSION(S) / ED DIAGNOSES  URI. COPD exacerbation.    Orbie Pyo, MD 11/19/15 1935  ----------------------------------------- 7:37 PM on 11/19/2015 -----------------------------------------  Re-auscultated the lungs and they are clear to auscultation still. Now with decreased expiratory phase time length. No rash or distress.  Saturations of 94% on room air. Heart rate 101 but this is after her nebulizer treatments.  Orbie Pyo, MD 11/19/15 [phone]

## 2015-11-19 NOTE — ED Notes (Signed)
Pt here with c/o shob, cough, fever, headache and chills for the past few days. Pt in no distress at this time, has a hx of COPD.

## 2015-11-19 NOTE — Discharge Instructions (Signed)
Chronic Obstructive Pulmonary Disease Exacerbation Chronic obstructive pulmonary disease (COPD) is a common lung problem. In COPD, the flow of air from the lungs is limited. COPD exacerbations are times that breathing gets worse and you need extra treatment. Without treatment they can be life threatening. If they happen often, your lungs can become more damaged. If your COPD gets worse, your doctor may treat you with:  Medicines.  Oxygen.  Different ways to clear your airway, such as using a mask. HOME CARE  Do not smoke.  Avoid tobacco smoke and other things that bother your lungs.  If given, take your antibiotic medicine as told. Finish the medicine even if you start to feel better.  Only take medicines as told by your doctor.  Drink enough fluids to keep your pee (urine) clear or pale yellow (unless your doctor has told you not to).  Use a cool mist machine (vaporizer).  If you use oxygen or a machine that turns liquid medicine into a mist (nebulizer), continue to use them as told.  Keep up with shots (vaccinations) as told by your doctor.  Exercise regularly.  Eat healthy foods.  Keep all doctor visits as told. GET HELP RIGHT AWAY IF:  You are very short of breath and it gets worse.  You have trouble talking.  You have bad chest pain.  You have blood in your spit (sputum).  You have a fever.  You keep throwing up (vomiting).  You feel weak, or you pass out (faint).  You feel confused.  You keep getting worse. MAKE SURE YOU:  Understand these instructions.  Will watch your condition.  Will get help right away if you are not doing well or get worse.   This information is not intended to replace advice given to you by your health care provider. Make sure you discuss any questions you have with your health care provider.   Document Released: 09/08/2011 Document Revised: 10/10/2014 Document Reviewed: 05/24/2013 Elsevier Interactive Patient Education 2016  Elsevier [employer]  Upper Respiratory Infection, Adult Most upper respiratory infections (URIs) are a viral infection of the air passages leading to the lungs. A URI affects the nose, throat, and upper air passages. The most common type of URI is nasopharyngitis and is typically referred to as "the common cold." URIs run their course and usually go away on their own. Most of the time, a URI does not require medical attention, but sometimes a bacterial infection in the upper airways can follow a viral infection. This is called a secondary infection. Sinus and middle ear infections are common types of secondary upper respiratory infections. Bacterial pneumonia can also complicate a URI. A URI can worsen asthma and chronic obstructive pulmonary disease (COPD). Sometimes, these complications can require emergency medical care and may be life threatening.  CAUSES Almost all URIs are caused by viruses. A virus is a type of germ and can spread from one person to another.  RISKS FACTORS You may be at risk for a URI if:   You smoke.   You have chronic heart or lung disease.  You have a weakened defense (immune) system.   You are very young or very old.   You have nasal allergies or asthma.  You work in crowded or poorly ventilated areas.  You work in health care facilities or schools. SIGNS AND SYMPTOMS  Symptoms typically develop 2-3 days after you come in contact with a cold virus. Most viral URIs last 7-10 days. However, viral URIs from the  influenza virus (flu virus) can last 14-18 days and are typically more severe. Symptoms may include:   Runny or stuffy (congested) nose.   Sneezing.   Cough.   Sore throat.   Headache.   Fatigue.   Fever.   Loss of appetite.   Pain in your forehead, behind your eyes, and over your cheekbones (sinus pain).  Muscle aches.  DIAGNOSIS  Your health care provider may diagnose a URI by:  Physical exam.  Tests to check that your  symptoms are not due to another condition such as:  Strep throat.  Sinusitis.  Pneumonia.  Asthma. TREATMENT  A URI goes away on its own with time. It cannot be cured with medicines, but medicines may be prescribed or recommended to relieve symptoms. Medicines may help:  Reduce your fever.  Reduce your cough.  Relieve nasal congestion. HOME CARE INSTRUCTIONS   Take medicines only as directed by your health care provider.   Gargle warm saltwater or take cough drops to comfort your throat as directed by your health care provider.  Use a warm mist humidifier or inhale steam from a shower to increase air moisture. This may make it easier to breathe.  Drink enough fluid to keep your urine clear or pale yellow.   Eat soups and other clear broths and maintain good nutrition.   Rest as needed.   Return to work when your temperature has returned to normal or as your health care provider advises. You may need to stay home longer to avoid infecting others. You can also use a face mask and careful hand washing to prevent spread of the virus.  Increase the usage of your inhaler if you have asthma.   Do not use any tobacco products, including cigarettes, chewing tobacco, or electronic cigarettes. If you need help quitting, ask your health care provider. PREVENTION  The best way to protect yourself from getting a cold is to practice good hygiene.   Avoid oral or hand contact with people with cold symptoms.   Wash your hands often if contact occurs.  There is no clear evidence that vitamin C, vitamin E, echinacea, or exercise reduces the chance of developing a cold. However, it is always recommended to get plenty of rest, exercise, and practice good nutrition.  SEEK MEDICAL CARE IF:   You are getting worse rather than better.   Your symptoms are not controlled by medicine.   You have chills.  You have worsening shortness of breath.  You have brown or red mucus.  You  have yellow or brown nasal discharge.  You have pain in your face, especially when you bend forward.  You have a fever.  You have swollen neck glands.  You have pain while swallowing.  You have white areas in the back of your throat. SEEK IMMEDIATE MEDICAL CARE IF:   You have severe or persistent:  Headache.  Ear pain.  Sinus pain.  Chest pain.  You have chronic lung disease and any of the following:  Wheezing.  Prolonged cough.  Coughing up blood.  A change in your usual mucus.  You have a stiff neck.  You have changes in your:  Vision.  Hearing.  Thinking.  Mood. MAKE SURE YOU:   Understand these instructions.  Will watch your condition.  Will get help right away if you are not doing well or get worse.   This information is not intended to replace advice given to you by your health care provider. Make sure  you discuss any questions you have with your health care provider.   Document Released: 03/15/2001 Document Revised: 02/03/2015 Document Reviewed: 12/25/2013 Elsevier Interactive Patient Education [employer].

## 2016-06-26 ENCOUNTER — Encounter: Payer: Self-pay | Admitting: Emergency Medicine

## 2016-06-26 ENCOUNTER — Emergency Department: Payer: Medicaid Other

## 2016-06-26 ENCOUNTER — Emergency Department
Admission: EM | Admit: 2016-06-26 | Discharge: 2016-06-26 | Disposition: A | Discharge status: Home / Self Care | Payer: Medicaid Other | Attending: Emergency Medicine | Admitting: Emergency Medicine

## 2016-06-26 DIAGNOSIS — J441 Chronic obstructive pulmonary disease with (acute) exacerbation: Secondary | ICD-10-CM | POA: Diagnosis not present

## 2016-06-26 DIAGNOSIS — Z79899 Other long term (current) drug therapy: Secondary | ICD-10-CM | POA: Diagnosis not present

## 2016-06-26 DIAGNOSIS — I10 Essential (primary) hypertension: Secondary | ICD-10-CM | POA: Insufficient documentation

## 2016-06-26 DIAGNOSIS — R05 Cough: Secondary | ICD-10-CM | POA: Diagnosis present

## 2016-06-26 DIAGNOSIS — F17210 Nicotine dependence, cigarettes, uncomplicated: Secondary | ICD-10-CM | POA: Diagnosis not present

## 2016-06-26 DIAGNOSIS — Z760 Encounter for issue of repeat prescription: Secondary | ICD-10-CM

## 2016-06-26 MED ORDER — IPRATROPIUM-ALBUTEROL 0.5-2.5 (3) MG/3ML IN SOLN: 3.0000 mL | Freq: Four times a day (QID) | RESPIRATORY_TRACT | 0 refills | Status: DC | PRN

## 2016-06-26 MED ORDER — IPRATROPIUM-ALBUTEROL 0.5-2.5 (3) MG/3ML IN SOLN
3.0000 mL | Freq: Once | RESPIRATORY_TRACT | Status: AC
  Administered 2016-06-26: 3 mL via RESPIRATORY_TRACT
  Filled 2016-06-26: qty 3

## 2016-06-26 MED ORDER — AZITHROMYCIN 250 MG PO TABS: ORAL_TABLET | ORAL | 0 refills | Status: AC

## 2016-06-26 MED ORDER — BUDESONIDE-FORMOTEROL FUMARATE 80-4.5 MCG/ACT IN AERO: 2.0000 | INHALATION_SPRAY | Freq: Two times a day (BID) | RESPIRATORY_TRACT | 0 refills | Status: DC

## 2016-06-26 MED ORDER — AZITHROMYCIN 500 MG PO TABS
500.0000 mg | ORAL_TABLET | Freq: Once | ORAL | Status: AC
  Administered 2016-06-26: 500 mg via ORAL
  Filled 2016-06-26: qty 1

## 2016-06-26 MED ORDER — PREDNISONE 10 MG PO TABS: ORAL_TABLET | ORAL | 0 refills | Status: DC

## 2016-06-26 MED ORDER — ALBUTEROL SULFATE HFA 108 (90 BASE) MCG/ACT IN AERS: 1.0000 | INHALATION_SPRAY | Freq: Four times a day (QID) | RESPIRATORY_TRACT | 0 refills | Status: DC | PRN

## 2016-06-26 MED ORDER — PREDNISONE 20 MG PO TABS
60.0000 mg | ORAL_TABLET | Freq: Once | ORAL | Status: AC
  Administered 2016-06-26: 60 mg via ORAL
  Filled 2016-06-26: qty 3

## 2016-06-26 NOTE — ED Triage Notes (Signed)
States cough and wheezing x 5 days. Did not do her nebulizer machine today. States out of short acting meds

## 2016-06-26 NOTE — ED Provider Notes (Signed)
[facility] Emergency Department Provider Note  ____________________________________________  Time seen: Approximately 3:52 PM  I have reviewed the triage vital signs and the nursing notes.   HISTORY  Chief Complaint Cough and Wheezing    HPI Cindy Gutierrez is a 55 y.o. female , NAD, presents to emergency by with five-day history of cough, shortness of breath and wheezing. States she has a history of COPD and has had an upper respiratory infection over the last 5 days. Notes that she has run out of her Symbicort and albuterol which is her main reason for coming into the emergency department today. Has had COPD exacerbations in the past which have responded well to azithromycin and prednisone. Has not had an exacerbation of her COPD in approximately one year. Denies any chest pain, lightheadedness. Has not had any fevers, chills, body aches, abdominal pain, nausea, vomiting. She does have a nebulizer at home but does not have in date medications that she can use.   Past Medical History:  Diagnosis Date  . COPD (chronic obstructive pulmonary disease) ([place])   . Hypertension     There are no active problems to display for this patient.   Past Surgical History:  Procedure Laterality Date  . TUBAL LIGATION      Prior to Admission medications   Medication Sig Start Date End Date Taking? Authorizing Provider  albuterol (PROVENTIL HFA;VENTOLIN HFA) 108 (90 Base) MCG/ACT inhaler Inhale 1-2 puffs into the lungs every 6 (six) hours as needed for wheezing or shortness of breath. 06/26/16   Almer Littleton L Suprina [place], PA-C  amLODipine (NORVASC) 5 MG tablet Take 1 tablet (5 mg total) by mouth once. 12/26/14   Everlene Balls, MD  azithromycin (ZITHROMAX Z-PAK) 250 MG tablet Take 1 tablet (250 mg) on  Day 2, 3, 4, 5. (First dose given in the emergency department) 06/26/16 07/01/16  Mavryk Pino L Maryiah Olvey, PA-C  budesonide-formoterol (SYMBICORT) 80-4.5 MCG/ACT inhaler Inhale 2 puffs into the lungs 2  (two) times daily. 06/26/16 07/26/16  Vaughn Beaumier L Benigno Check, PA-C  ipratropium-albuterol (DUONEB) 0.5-2.5 (3) MG/3ML SOLN Take 3 mLs by nebulization every 6 (six) hours as needed. 06/26/16   Giulianna Rocha L Ura Yingling, PA-C  predniSONE (DELTASONE) 10 MG tablet Take a daily regimen of 5,4,3,2,1 (first dose was received in the emergency department) 06/26/16   Carold Eisner L Dallyn Bergland, PA-C    Allergies Bee pollen; Penicillins; and Sulfa antibiotics  History reviewed. No pertinent family history.  Social History Social History  Substance Use Topics  . Smoking status: Current Every Day Smoker    Packs/day: 0.50    Types: Cigarettes  . Smokeless tobacco: Never Used  . Alcohol use Yes     Review of Systems  Constitutional: No fever/chills Cardiovascular: No chest pain. Respiratory: Positive cough, chest congestion, shortness breath and wheezing. Gastrointestinal: No abdominal pain.  No nausea, vomiting.   Musculoskeletal: Negative for general myalgias.  Skin: Negative for rash. Neurological: Negative for lightheadedness. 10-point ROS otherwise negative.  ____________________________________________   PHYSICAL EXAM:  VITAL SIGNS: ED Triage Vitals  Enc Vitals Group     BP 06/26/16 1505 (!) 171/82     Pulse Rate 06/26/16 1505 90     Resp 06/26/16 1505 20     Temp 06/26/16 1505 98.4 F (36.9 C)     Temp src --      SpO2 06/26/16 1505 98 %     Weight 06/26/16 1506 149 lb (67.6 kg)     Height 06/26/16 1506 5\' 10"  (1.778 m)  Head Circumference --      Peak Flow --      Pain Score --      Pain Loc --      Pain Edu? --      Excl. in [place]? --      Constitutional: Alert and oriented. Well appearing and in no acute distress. Eyes: Conjunctivae are normal. PERRL. EOMI without pain.  Head: Atraumatic. ENT:      Nose: No congestion/rhinnorhea.      Mouth/Throat: Mucous membranes are moist.  Neck: Supple with full range of motion Hematological/Lymphatic/Immunilogical: No cervical  lymphadenopathy. Cardiovascular: Normal rate, regular rhythm. Normal S1 and S2.  Good peripheral circulation. Respiratory: Normal respiratory effort without tachypnea or retractions. Lungs CTAB with mildly decreased breath sounds throughout without wheezes, rhonchi or rales. Patient was given DuoNeb breathing treatment in triage prior to examination. Neurologic:  Normal speech and language. No gross focal neurologic deficits are appreciated.  Skin:  Skin is warm, dry and intact. No rash noted. Psychiatric: Mood and affect are normal. Speech and behavior are normal. Patient exhibits appropriate insight and judgement.   ____________________________________________   LABS  None ____________________________________________  EKG  None ____________________________________________  RADIOLOGY I have personally viewed and evaluated these images (plain radiographs) as part of my medical decision making, as well as reviewing the written report by the radiologist.  Dg Chest 2 View  Result Date: 06/26/2016 CLINICAL DATA:  Productive cough and wheezing for the past 5 days. COPD. EXAM: CHEST  2 VIEW COMPARISON:  11/19/2015. FINDINGS: Normal sized heart. Clear lungs. The lungs are mildly hyperexpanded with mild diffuse peribronchial thickening. Unremarkable bones. IMPRESSION: No acute abnormality.  Mild changes of COPD and chronic bronchitis Electronically Signed   By: Claudie Revering M.D.   On: 06/26/2016 15:46    ____________________________________________    PROCEDURES  Procedure(s) performed: None   Procedures   Medications  ipratropium-albuterol (DUONEB) 0.5-2.5 (3) MG/3ML nebulizer solution 3 mL (3 mLs Nebulization Given 06/26/16 1513)  azithromycin (ZITHROMAX) tablet 500 mg (500 mg Oral Given 06/26/16 1612)  predniSONE (DELTASONE) tablet 60 mg (60 mg Oral Given 06/26/16 1612)     ____________________________________________   INITIAL IMPRESSION / ASSESSMENT AND PLAN / ED  COURSE  Pertinent labs & imaging results that were available during my care of the patient were reviewed by me and considered in my medical decision making (see chart for details).  Clinical Course  Comment By Time  Abdomen times discharge patient asked for first dose of antibiotic and prednisone prior to leaving the emergency department as she does not have money to purchase prescriptions today. Medications were ordered. Braxton Feathers, PA-C 09/24 1603    Patient's diagnosis is consistent with COPD with exacerbation and medication refill. Patient was given the first dose of her antibiotic and prednisone while in the emergency department. Patient will be discharged home with prescriptions for the remaining tablets of the Z-Pak and prednisone Dosepak to take as directed. Patient also given a prescription for an albuterol inhaler, Symbicort inhaler and DuoNeb solution for her nebulizer at home. Patient is accompanied by a family member and it is strongly encouraged that the patient be assisted in getting her medications filled within 24 hours. Patient is to follow up with her primary care provider or [facility] if symptoms persist past this treatment course. Patient is given ED precautions to return to the ED for any worsening or new symptoms.    ____________________________________________  FINAL CLINICAL IMPRESSION(S) / ED DIAGNOSES  Final diagnoses:  COPD with exacerbation ([place])  Medication refill      NEW MEDICATIONS STARTED DURING THIS VISIT:  New Prescriptions   ALBUTEROL (PROVENTIL HFA;VENTOLIN HFA) 108 (90 BASE) MCG/ACT INHALER    Inhale 1-2 puffs into the lungs every 6 (six) hours as needed for wheezing or shortness of breath.   AZITHROMYCIN (ZITHROMAX Z-PAK) 250 MG TABLET    Take 1 tablet (250 mg) on  Day 2, 3, 4, 5. (First dose given in the emergency department)   BUDESONIDE-FORMOTEROL (SYMBICORT) 80-4.5 MCG/ACT INHALER    Inhale 2 puffs into the lungs 2 (two) times  daily.   IPRATROPIUM-ALBUTEROL (DUONEB) 0.5-2.5 (3) MG/3ML SOLN    Take 3 mLs by nebulization every 6 (six) hours as needed.   PREDNISONE (DELTASONE) 10 MG TABLET    Take a daily regimen of 5,4,3,2,1 (first dose was received in the emergency department)         Braxton Feathers, PA-C 06/26/16 1628    Lisa Roca, MD 06/30/16 1704

## 2017-03-10 ENCOUNTER — Other Ambulatory Visit: Payer: Self-pay | Admitting: Nurse Practitioner

## 2017-03-10 DIAGNOSIS — Z1231 Encounter for screening mammogram for malignant neoplasm of breast: Secondary | ICD-10-CM

## 2017-03-29 ENCOUNTER — Ambulatory Visit
Admission: RE | Admit: 2017-03-29 | Discharge: 2017-03-29 | Disposition: A | Discharge status: Home / Self Care | Payer: Medicaid Other | Source: Ambulatory Visit | Attending: Nurse Practitioner | Admitting: Nurse Practitioner

## 2017-03-29 DIAGNOSIS — Z1231 Encounter for screening mammogram for malignant neoplasm of breast: Secondary | ICD-10-CM | POA: Insufficient documentation

## 2017-05-30 DIAGNOSIS — B009 Herpesviral infection, unspecified: Secondary | ICD-10-CM | POA: Diagnosis not present

## 2017-05-30 DIAGNOSIS — D1779 Benign lipomatous neoplasm of other sites: Secondary | ICD-10-CM | POA: Diagnosis not present

## 2017-05-30 DIAGNOSIS — N9089 Other specified noninflammatory disorders of vulva and perineum: Secondary | ICD-10-CM | POA: Diagnosis not present

## 2017-05-30 DIAGNOSIS — B977 Papillomavirus as the cause of diseases classified elsewhere: Secondary | ICD-10-CM | POA: Diagnosis not present

## 2017-05-30 DIAGNOSIS — D179 Benign lipomatous neoplasm, unspecified: Secondary | ICD-10-CM | POA: Insufficient documentation

## 2017-05-31 DIAGNOSIS — B977 Papillomavirus as the cause of diseases classified elsewhere: Secondary | ICD-10-CM | POA: Insufficient documentation

## 2017-05-31 DIAGNOSIS — B009 Herpesviral infection, unspecified: Secondary | ICD-10-CM | POA: Insufficient documentation

## 2017-06-13 DIAGNOSIS — Z01818 Encounter for other preprocedural examination: Secondary | ICD-10-CM | POA: Diagnosis not present

## 2017-06-13 DIAGNOSIS — Z720 Tobacco use: Secondary | ICD-10-CM | POA: Insufficient documentation

## 2017-06-13 DIAGNOSIS — I10 Essential (primary) hypertension: Secondary | ICD-10-CM | POA: Diagnosis not present

## 2017-06-13 DIAGNOSIS — J449 Chronic obstructive pulmonary disease, unspecified: Secondary | ICD-10-CM | POA: Diagnosis not present

## 2017-06-13 DIAGNOSIS — J441 Chronic obstructive pulmonary disease with (acute) exacerbation: Secondary | ICD-10-CM | POA: Insufficient documentation

## 2017-09-27 ENCOUNTER — Other Ambulatory Visit: Payer: Self-pay

## 2017-09-27 ENCOUNTER — Encounter: Payer: Self-pay | Admitting: Emergency Medicine

## 2017-09-27 ENCOUNTER — Emergency Department: Payer: Medicaid Other

## 2017-09-27 DIAGNOSIS — R0602 Shortness of breath: Secondary | ICD-10-CM | POA: Insufficient documentation

## 2017-09-27 DIAGNOSIS — Z5321 Procedure and treatment not carried out due to patient leaving prior to being seen by health care provider: Secondary | ICD-10-CM | POA: Insufficient documentation

## 2017-09-27 NOTE — ED Triage Notes (Signed)
Pt arrived to the ED for complaints of shortness of breath and cough. Pt reports that she is a COPD patient and that she has an exacerbation of it. Pt is AOx4 in no apparent distress.

## 2017-09-28 ENCOUNTER — Emergency Department
Admission: EM | Admit: 2017-09-28 | Discharge: 2017-09-28 | Discharge status: Against Medical Advice | Payer: Medicaid Other | Attending: Emergency Medicine | Admitting: Emergency Medicine

## 2017-09-28 MED ORDER — PREDNISONE 20 MG PO TABS: 60.0000 mg | ORAL_TABLET | Freq: Once | ORAL | Status: DC

## 2017-09-28 MED ORDER — IPRATROPIUM-ALBUTEROL 0.5-2.5 (3) MG/3ML IN SOLN: 3.0000 mL | Freq: Once | RESPIRATORY_TRACT | Status: DC

## 2017-09-28 NOTE — ED Provider Notes (Signed)
MSE was initiated and I personally evaluated the patient and placed orders (if any) at  12:03 AM on September 28, 2017.  The patient appears stable so that the remainder of the MSE may be completed by another provider.   Merlyn Lot, MD 09/28/17 [phone]

## 2017-10-20 DIAGNOSIS — R011 Cardiac murmur, unspecified: Secondary | ICD-10-CM | POA: Insufficient documentation

## 2017-10-20 DIAGNOSIS — I10 Essential (primary) hypertension: Secondary | ICD-10-CM | POA: Diagnosis not present

## 2017-10-20 DIAGNOSIS — J42 Unspecified chronic bronchitis: Secondary | ICD-10-CM | POA: Diagnosis not present

## 2017-12-18 ENCOUNTER — Encounter: Payer: Self-pay | Admitting: Emergency Medicine

## 2017-12-18 ENCOUNTER — Emergency Department: Payer: Medicaid Other

## 2017-12-18 ENCOUNTER — Emergency Department
Admission: EM | Admit: 2017-12-18 | Discharge: 2017-12-18 | Disposition: A | Discharge status: Home / Self Care | Payer: Medicaid Other | Attending: Emergency Medicine | Admitting: Emergency Medicine

## 2017-12-18 ENCOUNTER — Other Ambulatory Visit: Payer: Self-pay

## 2017-12-18 DIAGNOSIS — Z79899 Other long term (current) drug therapy: Secondary | ICD-10-CM | POA: Diagnosis not present

## 2017-12-18 DIAGNOSIS — F17210 Nicotine dependence, cigarettes, uncomplicated: Secondary | ICD-10-CM | POA: Insufficient documentation

## 2017-12-18 DIAGNOSIS — I10 Essential (primary) hypertension: Secondary | ICD-10-CM | POA: Insufficient documentation

## 2017-12-18 DIAGNOSIS — R079 Chest pain, unspecified: Secondary | ICD-10-CM | POA: Diagnosis not present

## 2017-12-18 DIAGNOSIS — J449 Chronic obstructive pulmonary disease, unspecified: Secondary | ICD-10-CM | POA: Diagnosis not present

## 2017-12-18 DIAGNOSIS — R05 Cough: Secondary | ICD-10-CM | POA: Insufficient documentation

## 2017-12-18 DIAGNOSIS — R0789 Other chest pain: Secondary | ICD-10-CM | POA: Diagnosis present

## 2017-12-18 DIAGNOSIS — R0602 Shortness of breath: Secondary | ICD-10-CM | POA: Insufficient documentation

## 2017-12-18 LAB — COMPREHENSIVE METABOLIC PANEL
ALBUMIN: 4.1 g/dL (ref 3.5–5.0)
ALK PHOS: 119 U/L (ref 38–126)
ALT: 19 U/L (ref 14–54)
AST: 24 U/L (ref 15–41)
Anion gap: 8 (ref 5–15)
BILIRUBIN TOTAL: 0.5 mg/dL (ref 0.3–1.2)
BUN: 12 mg/dL (ref 6–20)
CALCIUM: 10.6 mg/dL — AB (ref 8.9–10.3)
CO2: 25 mmol/L (ref 22–32)
CREATININE: 0.74 mg/dL (ref 0.44–1.00)
Chloride: 103 mmol/L (ref 101–111)
GFR calc Af Amer: >60 mL/min (ref >60)
GLUCOSE: 93 mg/dL (ref 65–99)
Potassium: 3.8 mmol/L (ref 3.5–5.1)
Sodium: 136 mmol/L (ref 135–145)
TOTAL PROTEIN: 8 g/dL (ref 6.5–8.1)

## 2017-12-18 LAB — CBC
HEMATOCRIT: 45.4 % (ref 35.0–47.0)
Hemoglobin: 14.7 g/dL (ref 12.0–16.0)
MCH: 27.4 pg (ref 26.0–34.0)
MCHC: 32.5 g/dL (ref 32.0–36.0)
MCV: 84.5 fL (ref 80.0–100.0)
Platelets: 256 10*3/uL (ref 150–440)
RBC: 5.37 MIL/uL — AB (ref 3.80–5.20)
RDW: 13 % (ref 11.5–14.5)
WBC: 8.7 10*3/uL (ref 3.6–11.0)

## 2017-12-18 LAB — TROPONIN I: Troponin I: <0.03 ng/mL (ref <0.03)

## 2017-12-18 MED ORDER — PREDNISONE 20 MG PO TABS
60.0000 mg | ORAL_TABLET | Freq: Once | ORAL | Status: AC
  Administered 2017-12-18: 60 mg via ORAL
  Filled 2017-12-18: qty 3

## 2017-12-18 MED ORDER — PREDNISONE 50 MG PO TABS: 50.0000 mg | ORAL_TABLET | Freq: Every day | ORAL | 0 refills | Status: AC

## 2017-12-18 MED ORDER — AZITHROMYCIN 250 MG PO TABS: ORAL_TABLET | ORAL | 0 refills | Status: AC

## 2017-12-18 MED ORDER — IOPAMIDOL (ISOVUE-370) INJECTION 76%
75.0000 mL | Freq: Once | INTRAVENOUS | Status: AC | PRN
  Administered 2017-12-18: 75 mL via INTRAVENOUS

## 2017-12-18 MED ORDER — IPRATROPIUM-ALBUTEROL 0.5-2.5 (3) MG/3ML IN SOLN
3.0000 mL | Freq: Once | RESPIRATORY_TRACT | Status: AC
  Administered 2017-12-18: 3 mL via RESPIRATORY_TRACT
  Filled 2017-12-18: qty 3

## 2017-12-18 NOTE — ED Provider Notes (Signed)
[facility] Emergency Department Provider Note  ____________________________________________   First MD Initiated Contact with Patient 12/18/17 1358     (approximate)  I have reviewed the triage vital signs and the nursing notes.   HISTORY  Chief Complaint Chest Pain and Cough   HPI Cindy Gutierrez is a 57 y.o. female comes to the emergency department with sharp upper pleuritic chest pain for the last 3-4 days.  She has a cough that is worse with exertion.  She does have a history of COPD.  The pain is worse with deep inspiration and somewhat improved with rest.  Pain is nonradiating.  She has used her inhaler at home with minimal relief.  She is not on oxygen at baseline.  She has no history of DVT or pulmonary embolism.  No recent surgery travel or immobilization.  No fevers or chills.  Past Medical History:  Diagnosis Date  . COPD (chronic obstructive pulmonary disease) ([place])   . Hypertension     There are no active problems to display for this patient.   Past Surgical History:  Procedure Laterality Date  . TUBAL LIGATION      Prior to Admission medications   Medication Sig Start Date End Date Taking? Authorizing Provider  albuterol (PROVENTIL HFA;VENTOLIN HFA) 108 (90 Base) MCG/ACT inhaler Inhale 1-2 puffs into the lungs every 6 (six) hours as needed for wheezing or shortness of breath. 06/26/16   Hagler, Jami L, PA-C  amLODipine (NORVASC) 5 MG tablet Take 1 tablet (5 mg total) by mouth once. 12/26/14   Everlene Balls, MD  azithromycin (ZITHROMAX Z-PAK) 250 MG tablet Take 2 tablets (500 mg) on  Day 1,  followed by 1 tablet (250 mg) once daily on Days 2 through 5. 12/18/17 12/23/17  Darel Hong, MD  budesonide-formoterol (SYMBICORT) 80-4.5 MCG/ACT inhaler Inhale 2 puffs into the lungs 2 (two) times daily. 06/26/16 07/26/16  Hagler, Jami L, PA-C  ipratropium-albuterol (DUONEB) 0.5-2.5 (3) MG/3ML SOLN Take 3 mLs by nebulization every 6 (six) hours as  needed. 06/26/16   Hagler, Jami L, PA-C  predniSONE (DELTASONE) 10 MG tablet Take a daily regimen of 5,4,3,2,1 (first dose was received in the emergency department) 06/26/16   Hagler, Jami L, PA-C  predniSONE (DELTASONE) 50 MG tablet Take 1 tablet (50 mg total) by mouth daily for 4 days. 12/18/17 12/22/17  Darel Hong, MD    Allergies Bee pollen; Lisinopril; Penicillins; and Sulfa antibiotics  No family history on file.  Social History Social History   Tobacco Use  . Smoking status: Current Every Day Smoker    Packs/day: 0.50    Types: Cigarettes  . Smokeless tobacco: Never Used  Substance Use Topics  . Alcohol use: Yes  . Drug use: No    Review of Systems Constitutional: No fever/chills Eyes: No visual changes. ENT: No sore throat. Cardiovascular: Positive for chest pain. Respiratory: Positive for shortness of breath. Gastrointestinal: No abdominal pain.  No nausea, no vomiting.  No diarrhea.  No constipation. Genitourinary: Negative for dysuria. Musculoskeletal: Negative for back pain. Skin: Negative for rash. Neurological: Negative for headaches, focal weakness or numbness.   ____________________________________________   PHYSICAL EXAM:  VITAL SIGNS: ED Triage Vitals  Enc Vitals Group     BP 12/18/17 1203 (!) 178/68     Pulse Rate 12/18/17 1203 76     Resp 12/18/17 1203 20     Temp 12/18/17 1203 98.6 F (37 C)     Temp Source 12/18/17 1203 Oral  SpO2 12/18/17 1203 98 %     Weight 12/18/17 1205 158 lb (71.7 kg)     Height 12/18/17 1205 5\' 10"  (1.778 m)     Head Circumference --      Peak Flow --      Pain Score 12/18/17 1205 8     Pain Loc --      Pain Edu? --      Excl. in [place]? --     Constitutional: Alert and oriented x4 appears somewhat short of breath nontoxic no diaphoresis speaks in full clear sentences Eyes: PERRL EOMI. Head: Atraumatic. Nose: No congestion/rhinnorhea. Mouth/Throat: No trismus Neck: No stridor.   Cardiovascular: Normal  rate, regular rhythm. Grossly normal heart sounds.  Good peripheral circulation. Respiratory: Increased respiratory effort although no accessory muscle use.  Lungs largely clear Gastrointestinal: Soft nontender Musculoskeletal: No lower extremity edema   Neurologic:  Normal speech and language. No gross focal neurologic deficits are appreciated. Skin:  Skin is warm, dry and intact. No rash noted. Psychiatric: Mood and affect are normal. Speech and behavior are normal.    ____________________________________________   DIFFERENTIAL includes but not limited to  Pulmonary embolism, pneumonia, pneumothorax, COPD exacerbation ____________________________________________   LABS (all labs ordered are listed, but only abnormal results are displayed)  Labs Reviewed  CBC - Abnormal; Notable for the following components:      Result Value   RBC 5.37 (*)    All other components within normal limits  COMPREHENSIVE METABOLIC PANEL - Abnormal; Notable for the following components:   Calcium 10.6 (*)    All other components within normal limits  TROPONIN I    Lab work reviewed by me with elevated calcium is nonspecific but could be concerning for malignancy __________________________________________  EKG  ED ECG REPORT I, Darel Hong, the attending physician, personally viewed and interpreted this ECG.  Date: 12/18/2017 EKG Time:  Rate: 74 Rhythm: normal sinus rhythm QRS Axis: Left axis Intervals: normal ST/T Wave abnormalities: normal Narrative Interpretation: no evidence of acute ischemia  ____________________________________________  RADIOLOGY  X-ray of the chest reviewed by me with no infiltrate CT angiogram of the chest reviewed by me shows inadequate bolus timing however no large pulmonary embolism and no evidence of pneumonia ____________________________________________   PROCEDURES  Procedure(s) performed: no  Procedures  Critical Care performed:  no  Observation: no ____________________________________________   INITIAL IMPRESSION / ASSESSMENT AND PLAN / ED COURSE  Pertinent labs & imaging results that were available during my care of the patient were reviewed by me and considered in my medical decision making (see chart for details).  The patient arrives somewhat short of breath with pleuritic chest pain and history of COPD although with largely clear lungs.  Her symptoms may very well be a COPD exacerbation although I would expect this level of shortness of breath to cause some wheeze.  I will give her a DuoNeb now but I do believe she requires a CT angiogram to evaluate for pulmonary embolism.  She initially declined stating she would prefer to follow-up tomorrow for reevaluation.     ----------------------------------------- 2:32 PM on 12/18/2017 -----------------------------------------  The patient has now changed her mind and would like to stay for her CT. ____________________________________________ ----------------------------------------- 3:57 PM on 12/18/2017 -----------------------------------------  Was called by the CT tech that the patient's IV infiltrated and she did not get an adequate contrast load.  Fortunately it was in the to show that she does not have a large pulmonary embolism.  The patient  does feel improved and would like to go home which I do think is reasonable.  She is discharged home with steroids and a Z-Pak.  Strict return precautions have been given.  FINAL CLINICAL IMPRESSION(S) / ED DIAGNOSES  Final diagnoses:  Shortness of breath  Chronic obstructive pulmonary disease, unspecified COPD type ([place])      NEW MEDICATIONS STARTED DURING THIS VISIT:  Discharge Medication List as of 12/18/2017  2:17 PM    START taking these medications   Details  azithromycin (ZITHROMAX Z-PAK) 250 MG tablet Take 2 tablets (500 mg) on  Day 1,  followed by 1 tablet (250 mg) once daily on Days 2 through 5.,  Print    !! predniSONE (DELTASONE) 50 MG tablet Take 1 tablet (50 mg total) by mouth daily for 4 days., Starting Mon 12/18/2017, Until Fri 12/22/2017, Print     !! - Potential duplicate medications found. Please discuss with provider.       Note:  This document was prepared using Dragon voice recognition software and may include unintentional dictation errors.     Darel Hong, MD 12/19/17 2317

## 2017-12-18 NOTE — ED Notes (Signed)
Pt discharged home after verbalizing understanding of discharge instructions; nad noted. 

## 2017-12-18 NOTE — ED Triage Notes (Signed)
Chest pain x 3 to 4 days. States cough worse during that time. Pain increases with inspiration.

## 2017-12-18 NOTE — ED Notes (Signed)
Pt originally did not want to stay for CT scan but decided to do so. IV placement was difficult and was placed after 2 attempts: 1 by Stanton Kidney, another (successful) by Doristine Bosworth, RN. Pt's IV infiltrated during CT scan and pt refused to have another IV placed. She also insisted on leaving. Her discharge papers were given to her, but she did not want to go back to the room to sign. Pt given instructions on returning should she feel worse. NAD noted.

## 2017-12-18 NOTE — Discharge Instructions (Signed)
Fortunately today your blood work and your oxygen levels were reassuring.  Your lungs were clear however so I did recommend a CT scan to check for a pulmonary embolism however you declined which I think is reasonable.  Please return to the emergency department for any new or worsening symptoms such as worsening shortness of breath, chest pain, or for any other issues whatsoever.  It was a pleasure to take care of you today, and thank you for coming to our emergency department.  If you have any questions or concerns before leaving please ask the nurse to grab me and I'm more than happy to go through your aftercare instructions again.  If you were prescribed any opioid pain medication today such as Norco, Vicodin, Percocet, morphine, hydrocodone, or oxycodone please make sure you do not drive when you are taking this medication as it can alter your ability to drive safely.  If you have any concerns once you are home that you are not improving or are in fact getting worse before you can make it to your follow-up appointment, please do not hesitate to call 911 and come back for further evaluation.  Darel Hong, MD  Results for orders placed or performed during the hospital encounter of 12/18/17  CBC  Result Value Ref Range   WBC 8.7 3.6 - 11.0 K/uL   RBC 5.37 (H) 3.80 - 5.20 MIL/uL   Hemoglobin 14.7 12.0 - 16.0 g/dL   HCT 45.4 35.0 - 47.0 %   MCV 84.5 80.0 - 100.0 fL   MCH 27.4 26.0 - 34.0 pg   MCHC 32.5 32.0 - 36.0 g/dL   RDW 13.0 11.5 - 14.5 %   Platelets 256 150 - 440 K/uL  Troponin I  Result Value Ref Range   Troponin I <0.03 <0.03 ng/mL  Comprehensive metabolic panel  Result Value Ref Range   Sodium 136 135 - 145 mmol/L   Potassium 3.8 3.5 - 5.1 mmol/L   Chloride 103 101 - 111 mmol/L   CO2 25 22 - 32 mmol/L   Glucose, Bld 93 65 - 99 mg/dL   BUN 12 6 - 20 mg/dL   Creatinine, Ser 0.74 0.44 - 1.00 mg/dL   Calcium 10.6 (H) 8.9 - 10.3 mg/dL   Total Protein 8.0 6.5 - 8.1 g/dL   Albumin 4.1 3.5 - 5.0 g/dL   AST 24 15 - 41 U/L   ALT 19 14 - 54 U/L   Alkaline Phosphatase 119 38 - 126 U/L   Total Bilirubin 0.5 0.3 - 1.2 mg/dL   GFR calc non Af Amer >60 >60 mL/min   GFR calc Af Amer >60 >60 mL/min   Anion gap 8 5 - 15   Dg Chest 2 View  Result Date: 12/18/2017 CLINICAL DATA:  Pleuritic chest pain for the past 3 days. Symptoms greatest when supine. Pain radiates into the back. History of COPD. EXAM: CHEST - 2 VIEW COMPARISON:  PA and lateral chest x-ray of September 27, 2017 FINDINGS: The lungs are well-expanded. There is no focal infiltrate. There is no pleural effusion or pneumothorax. The heart and pulmonary vascularity are normal. The mediastinum is normal in width. There is faint calcification in the wall of the thoracic aortic arch. There is old deformity of the posterolateral aspect of the left seventh rib. IMPRESSION: Mild hyperinflation consistent with COPD or reactive airway disease. No pneumonia nor other acute cardiopulmonary abnormality. Thoracic aortic atherosclerosis. Electronically Signed   By: David  Martinique M.D.   On:  12/18/2017 13:10  ° ° ° °

## 2017-12-20 NOTE — Progress Notes (Signed)
Left voicemail asking patient to call us back to talk with [place] regarding her arm and how she was doing. LP

## 2017-12-20 NOTE — Progress Notes (Signed)
Contacted patient regarding her arm. Patient states she had a blister and then it went away. Patient has had swelling from above elbow to wrist.  She has no pain in arm.

## 2017-12-21 NOTE — Progress Notes (Signed)
Called patient to F/U extravasation that occurred on 12/18/2017. Patient states the swelling in her left arm continues to go down and there is no more bruising to her arm. She denies any pain to her left arm. (NBF, 12/21/17 @ 12pm)

## 2018-07-04 DIAGNOSIS — E785 Hyperlipidemia, unspecified: Secondary | ICD-10-CM | POA: Diagnosis not present

## 2018-07-04 DIAGNOSIS — Z0001 Encounter for general adult medical examination with abnormal findings: Secondary | ICD-10-CM | POA: Diagnosis not present

## 2018-07-04 DIAGNOSIS — J449 Chronic obstructive pulmonary disease, unspecified: Secondary | ICD-10-CM | POA: Diagnosis not present

## 2018-07-04 DIAGNOSIS — R87619 Unspecified abnormal cytological findings in specimens from cervix uteri: Secondary | ICD-10-CM | POA: Diagnosis not present

## 2018-07-04 DIAGNOSIS — Z1272 Encounter for screening for malignant neoplasm of vagina: Secondary | ICD-10-CM | POA: Diagnosis not present

## 2018-07-04 DIAGNOSIS — I10 Essential (primary) hypertension: Secondary | ICD-10-CM | POA: Diagnosis not present

## 2018-07-04 DIAGNOSIS — R7303 Prediabetes: Secondary | ICD-10-CM | POA: Diagnosis not present

## 2018-07-04 DIAGNOSIS — Z202 Contact with and (suspected) exposure to infections with a predominantly sexual mode of transmission: Secondary | ICD-10-CM | POA: Diagnosis not present

## 2018-07-04 DIAGNOSIS — N760 Acute vaginitis: Secondary | ICD-10-CM | POA: Diagnosis not present

## 2018-07-13 ENCOUNTER — Other Ambulatory Visit: Payer: Self-pay | Admitting: Nurse Practitioner

## 2018-07-13 DIAGNOSIS — I340 Nonrheumatic mitral (valve) insufficiency: Secondary | ICD-10-CM | POA: Diagnosis not present

## 2018-07-13 DIAGNOSIS — I10 Essential (primary) hypertension: Secondary | ICD-10-CM | POA: Diagnosis not present

## 2018-07-13 DIAGNOSIS — Z1231 Encounter for screening mammogram for malignant neoplasm of breast: Secondary | ICD-10-CM

## 2018-07-13 DIAGNOSIS — J449 Chronic obstructive pulmonary disease, unspecified: Secondary | ICD-10-CM | POA: Diagnosis not present

## 2018-07-19 DIAGNOSIS — R079 Chest pain, unspecified: Secondary | ICD-10-CM | POA: Diagnosis not present

## 2018-08-07 ENCOUNTER — Encounter: Payer: Self-pay | Admitting: Gastroenterology

## 2018-08-07 ENCOUNTER — Ambulatory Visit: Payer: Medicaid Other | Admitting: Gastroenterology

## 2018-08-07 ENCOUNTER — Other Ambulatory Visit: Payer: Self-pay

## 2018-08-07 ENCOUNTER — Encounter

## 2018-08-07 VITALS — BP 143/79 | HR 74 | Resp 17 | Wt 142.8 lb

## 2018-08-07 DIAGNOSIS — K5904 Chronic idiopathic constipation: Secondary | ICD-10-CM | POA: Diagnosis not present

## 2018-08-07 DIAGNOSIS — Z1211 Encounter for screening for malignant neoplasm of colon: Secondary | ICD-10-CM | POA: Diagnosis not present

## 2018-08-07 DIAGNOSIS — K5900 Constipation, unspecified: Secondary | ICD-10-CM

## 2018-08-07 NOTE — Patient Instructions (Signed)
High-Fiber Diet  Fiber, also called dietary fiber, is a type of carbohydrate found in fruits, vegetables, whole grains, and beans. A high-fiber diet can have many health benefits. Your health care provider may recommend a high-fiber diet to help:  · Prevent constipation. Fiber can make your bowel movements more regular.  · Lower your cholesterol.  · Relieve hemorrhoids, uncomplicated diverticulosis, or irritable bowel syndrome.  · Prevent overeating as part of a weight-loss plan.  · Prevent heart disease, type 2 diabetes, and certain cancers.    What is my plan?  The recommended daily intake of fiber includes:  · 38 grams for men under age 50.  · 30 grams for men over age 50.  · 25 grams for women under age 50.  · 21 grams for women over age 50.    You can get the recommended daily intake of dietary fiber by eating a variety of fruits, vegetables, grains, and beans. Your health care provider may also recommend a fiber supplement if it is not possible to get enough fiber through your diet.  What do I need to know about a high-fiber diet?  · Fiber supplements have not been widely studied for their effectiveness, so it is better to get fiber through food sources.  · Always check the fiber content on the nutrition facts label of any prepackaged food. Look for foods that contain at least 5 grams of fiber per serving.  · Ask your dietitian if you have questions about specific foods that are related to your condition, especially if those foods are not listed in the following section.  · Increase your daily fiber consumption gradually. Increasing your intake of dietary fiber too quickly may cause bloating, cramping, or gas.  · Drink plenty of water. Water helps you to digest fiber.  What foods can I eat?  Grains  Whole-grain breads. Multigrain cereal. Oats and oatmeal. Brown rice. Barley. Bulgur wheat. Millet. Bran muffins. Popcorn. Rye wafer crackers.  Vegetables   Sweet potatoes. Spinach. Kale. Artichokes. Cabbage. Broccoli. Green peas. Carrots. Squash.  Fruits  Berries. Pears. Apples. Oranges. Avocados. Prunes and raisins. Dried figs.  Meats and Other Protein Sources  [place]y, kidney, pinto, and soy beans. Split peas. Lentils. Nuts and seeds.  Dairy  Fiber-fortified yogurt.  Beverages  Fiber-fortified soy milk. Fiber-fortified orange juice.  Other  Fiber bars.  The items listed above may not be a complete list of recommended foods or beverages. Contact your dietitian for more options.  What foods are not recommended?  Grains  White bread. Pasta made with refined flour. White rice.  Vegetables  Fried potatoes. Canned vegetables. Well-cooked vegetables.  Fruits  Fruit juice. Cooked, strained fruit.  Meats and Other Protein Sources  Fatty cuts of meat. Fried poultry or fried fish.  Dairy  Milk. Yogurt. Cream cheese. Sour cream.  Beverages  Soft drinks.  Other  Cakes and pastries. Butter and oils.  The items listed above may not be a complete list of foods and beverages to avoid. Contact your dietitian for more information.  What are some tips for including high-fiber foods in my diet?  · Eat a wide variety of high-fiber foods.  · Make sure that half of all grains consumed each day are whole grains.  · Replace breads and cereals made from refined flour or white flour with whole-grain breads and cereals.  · Replace white rice with brown rice, bulgur wheat, or millet.  · Start the day with a breakfast that is high in fiber,   such as a cereal that contains at least 5 grams of fiber per serving.  · Use beans in place of meat in soups, salads, or pasta.  · Eat high-fiber snacks, such as berries, raw vegetables, nuts, or popcorn.  This information is not intended to replace advice given to you by your health care provider. Make sure you discuss any questions you have with your health care provider.  Document Released: 09/19/2005 Document Revised: 02/25/2016 Document Reviewed: 03/04/2014   Elsevier Interactive Patient Education © 2018 [employer]

## 2018-08-07 NOTE — Progress Notes (Signed)
Cindy Darby, MD [address]  [place]  [place], [place] [address]  Main: [phone]  Fax: [phone]    Gastroenterology Consultation  Referring Provider:     Danelle Gutierrez, Cindy Gutierrez Primary Care Physician:  Cindy Gutierrez, Cindy Gutierrez Primary Gastroenterologist:  Dr. Cephas Gutierrez Reason for Consultation:     Chronic constipation        HPI:   Cindy Gutierrez is a 57 y.o. female referred by Dr. Danelle Gutierrez, Cindy Gutierrez  for consultation & management of chronic constipation.  Patient has infrequent bowel movements for several years.  She takes laxatives as needed.  She acknowledges that her diet consists of meat and bread only.  Patient's PCP gave her samples of linzess, this helped to empty her bowels completely after the first dose.  She is taking it as needed only.  She does not remember the dose.   She is also due for colonoscopy  Tobacco use,chrnic No GI surgeries No fam h/o colon cancer   NSAIDs: None  Antiplts/Anticoagulants/Anti thrombotics: None  GI Procedures: None  Past Medical History:  Diagnosis Date  . COPD (chronic obstructive pulmonary disease) ([place])   . Hypertension     Past Surgical History:  Procedure Laterality Date  . TUBAL LIGATION      Current Outpatient Medications:  .  albuterol (PROVENTIL HFA;VENTOLIN HFA) 108 (90 Base) MCG/ACT inhaler, Inhale 1-2 puffs into the lungs every 6 (six) hours as needed for wheezing or shortness of breath., Disp: 1 Inhaler, Rfl: 0 .  amLODipine (NORVASC) 10 MG tablet, Take by mouth., Disp: , Rfl:  .  ipratropium-albuterol (DUONEB) 0.5-2.5 (3) MG/3ML SOLN, Take 3 mLs by nebulization every 6 (six) hours as needed., Disp: 15 mL, Rfl: 0 .  predniSONE (DELTASONE) 10 MG tablet, Take a daily regimen of 5,4,3,2,1 (first dose was received in the emergency department), Disp: 15 tablet, Rfl: 0 .  amLODipine (NORVASC) 5 MG tablet, Take 1 tablet (5 mg total) by mouth once. (Patient not taking: Reported on 08/07/2018), Disp:  30 tablet, Rfl: 0 .  budesonide-formoterol (SYMBICORT) 80-4.5 MCG/ACT inhaler, Inhale 2 puffs into the lungs 2 (two) times daily., Disp: 1 Inhaler, Rfl: 0 .  hydrochlorothiazide (HYDRODIURIL) 25 MG tablet, Take by mouth., Disp: , Rfl:  .  losartan (COZAAR) 100 MG tablet, Take by mouth., Disp: , Rfl:     No family history on file.   Social History   Tobacco Use  . Smoking status: Current Every Day Smoker    Packs/day: 0.50    Types: Cigarettes  . Smokeless tobacco: Never Used  Substance Use Topics  . Alcohol use: Yes  . Drug use: No    Allergies as of 08/07/2018 - Review Complete 08/07/2018  Allergen Reaction Noted  . Sulfa antibiotics Hives and Rash 07/26/2015  . Bee pollen Hives 07/26/2015  . Lisinopril Nausea Only 12/18/2017  . Penicillins Hives 07/26/2015    Review of Systems:    All systems reviewed and negative except where noted in HPI.   Physical Exam:  BP (!) 143/79 (BP Location: Left Arm, Patient Position: Sitting, Cuff Size: Normal)   Pulse 74   Resp 17   Wt 142 lb 12.8 oz (64.8 kg)   BMI 20.49 kg/m  No LMP recorded. Patient is postmenopausal.  General:   Alert,  Well-developed, well-nourished, pleasant and cooperative in NAD Head:  Normocephalic and atraumatic. Eyes:  Sclera clear, no icterus.   Conjunctiva pink. Ears:  Normal auditory acuity. Nose:  No deformity, discharge, or lesions. Mouth:  No deformity or lesions,oropharynx pink & moist. Neck:  Supple; no masses or thyromegaly. Lungs:  Respirations even and unlabored.  Clear throughout to auscultation.   No wheezes, crackles, or rhonchi. No acute distress. Heart:  Regular rate and rhythm; no murmurs, clicks, rubs, or gallops. Abdomen:  Normal bowel sounds. Soft, non-tender and non-distended without masses, hepatosplenomegaly or hernias noted.  No guarding or rebound tenderness.   Rectal: Not performed Msk:  Symmetrical without gross deformities. Good, equal movement & strength bilaterally. Pulses:   Normal pulses noted. Extremities:  No clubbing or edema.  No cyanosis. Neurologic:  Alert and oriented x3;  grossly normal neurologically. Skin:  Intact without significant lesions or rashes. No jaundice. Psych:  Alert and cooperative. Normal mood and affect.  Imaging Studies: None  Assessment and Plan:   Cindy Gutierrez is a 57 y.o. African-American female with tobacco use, COPD, hypertension with chronic constipation and overdue for colon cancer screening  Chronic constipation: Her diet is completely devoid of fiber Discussed with her about importance of high-fiber diet Advised to add fiber supplements, education material provided Linzess daily  Colon cancer screening: Overdue Schedule colonoscopy I have discussed alternative options, risks & benefits,  which include, but are not limited to, bleeding, infection, perforation,respiratory complication & drug reaction.  The patient agrees with this plan & written consent will be obtained.     Follow up in 2 to 3 months   Cindy Darby, MD

## 2018-08-08 DIAGNOSIS — R079 Chest pain, unspecified: Secondary | ICD-10-CM | POA: Diagnosis not present

## 2018-08-14 DIAGNOSIS — J40 Bronchitis, not specified as acute or chronic: Secondary | ICD-10-CM | POA: Diagnosis not present

## 2018-08-14 DIAGNOSIS — I10 Essential (primary) hypertension: Secondary | ICD-10-CM | POA: Diagnosis not present

## 2018-08-14 DIAGNOSIS — J209 Acute bronchitis, unspecified: Secondary | ICD-10-CM | POA: Diagnosis not present

## 2018-08-14 DIAGNOSIS — R0602 Shortness of breath: Secondary | ICD-10-CM | POA: Diagnosis not present

## 2018-08-14 DIAGNOSIS — E782 Mixed hyperlipidemia: Secondary | ICD-10-CM | POA: Diagnosis not present

## 2018-08-14 DIAGNOSIS — R079 Chest pain, unspecified: Secondary | ICD-10-CM | POA: Diagnosis not present

## 2018-08-14 DIAGNOSIS — A481 Legionnaires' disease: Secondary | ICD-10-CM | POA: Diagnosis not present

## 2018-08-14 DIAGNOSIS — R05 Cough: Secondary | ICD-10-CM | POA: Diagnosis not present

## 2018-08-14 DIAGNOSIS — J449 Chronic obstructive pulmonary disease, unspecified: Secondary | ICD-10-CM | POA: Diagnosis not present

## 2018-08-20 ENCOUNTER — Encounter: Payer: Self-pay | Admitting: *Deleted

## 2018-08-21 ENCOUNTER — Encounter
Admission: RE | Disposition: A | Discharge status: Home / Self Care | Payer: Self-pay | Source: Ambulatory Visit | Attending: Gastroenterology

## 2018-08-21 ENCOUNTER — Encounter: Payer: Self-pay | Admitting: *Deleted

## 2018-08-21 ENCOUNTER — Ambulatory Visit
Admission: RE | Admit: 2018-08-21 | Discharge: 2018-08-21 | Disposition: A | Discharge status: Home / Self Care | Payer: Medicaid Other | Source: Ambulatory Visit | Attending: Gastroenterology | Admitting: Gastroenterology

## 2018-08-21 ENCOUNTER — Ambulatory Visit: Payer: Medicaid Other | Admitting: Anesthesiology

## 2018-08-21 DIAGNOSIS — Z888 Allergy status to other drugs, medicaments and biological substances status: Secondary | ICD-10-CM | POA: Insufficient documentation

## 2018-08-21 DIAGNOSIS — Z882 Allergy status to sulfonamides status: Secondary | ICD-10-CM | POA: Diagnosis not present

## 2018-08-21 DIAGNOSIS — Z91030 Bee allergy status: Secondary | ICD-10-CM | POA: Insufficient documentation

## 2018-08-21 DIAGNOSIS — K621 Rectal polyp: Secondary | ICD-10-CM | POA: Diagnosis not present

## 2018-08-21 DIAGNOSIS — F17210 Nicotine dependence, cigarettes, uncomplicated: Secondary | ICD-10-CM | POA: Diagnosis not present

## 2018-08-21 DIAGNOSIS — J449 Chronic obstructive pulmonary disease, unspecified: Secondary | ICD-10-CM | POA: Diagnosis not present

## 2018-08-21 DIAGNOSIS — Z1211 Encounter for screening for malignant neoplasm of colon: Secondary | ICD-10-CM | POA: Diagnosis not present

## 2018-08-21 DIAGNOSIS — Z7951 Long term (current) use of inhaled steroids: Secondary | ICD-10-CM | POA: Insufficient documentation

## 2018-08-21 DIAGNOSIS — I10 Essential (primary) hypertension: Secondary | ICD-10-CM | POA: Diagnosis not present

## 2018-08-21 DIAGNOSIS — K5900 Constipation, unspecified: Secondary | ICD-10-CM

## 2018-08-21 DIAGNOSIS — Z880 Allergy status to penicillin: Secondary | ICD-10-CM | POA: Insufficient documentation

## 2018-08-21 DIAGNOSIS — Z79899 Other long term (current) drug therapy: Secondary | ICD-10-CM | POA: Diagnosis not present

## 2018-08-21 DIAGNOSIS — K635 Polyp of colon: Secondary | ICD-10-CM | POA: Diagnosis not present

## 2018-08-21 DIAGNOSIS — D126 Benign neoplasm of colon, unspecified: Secondary | ICD-10-CM | POA: Diagnosis not present

## 2018-08-21 DIAGNOSIS — D125 Benign neoplasm of sigmoid colon: Secondary | ICD-10-CM

## 2018-08-21 HISTORY — PX: COLONOSCOPY WITH PROPOFOL: SHX5780

## 2018-08-21 SURGERY — COLONOSCOPY WITH PROPOFOL
Anesthesia: General

## 2018-08-21 MED ORDER — LACTATED RINGERS IV SOLN
INTRAVENOUS | Status: DC | PRN
  Administered 2018-08-21: 14:00:00 via INTRAVENOUS

## 2018-08-21 MED ORDER — PROPOFOL 10 MG/ML IV BOLUS
INTRAVENOUS | Status: DC | PRN
  Administered 2018-08-21: 50 mg via INTRAVENOUS
  Administered 2018-08-21: 20 mg via INTRAVENOUS

## 2018-08-21 MED ORDER — PROPOFOL 500 MG/50ML IV EMUL
INTRAVENOUS | Status: DC | PRN
  Administered 2018-08-21: 100 ug/kg/min via INTRAVENOUS

## 2018-08-21 MED ORDER — SODIUM CHLORIDE 0.9 % IV SOLN
INTRAVENOUS | Status: DC
  Administered 2018-08-21: 1000 mL via INTRAVENOUS

## 2018-08-21 MED ORDER — GLYCOPYRROLATE 0.2 MG/ML IJ SOLN
INTRAMUSCULAR | Status: DC | PRN
  Administered 2018-08-21: 0.2 mg via INTRAVENOUS

## 2018-08-21 NOTE — Anesthesia Post-op Follow-up Note (Signed)
Anesthesia QCDR form completed.        

## 2018-08-21 NOTE — Transfer of Care (Signed)
Immediate Anesthesia Transfer of Care Note  Patient: Cindy Gutierrez  Procedure(s) Performed: COLONOSCOPY WITH PROPOFOL (N/A )  Patient Location: PACU  Anesthesia Type:MAC  Level of Consciousness: awake, alert , oriented and patient cooperative  Airway & Oxygen Therapy: Patient Spontanous Breathing and Patient connected to nasal cannula oxygen  Post-op Assessment: Report given to RN and Post -op Vital signs reviewed and stable  Post vital signs: Reviewed and stable  Last Vitals:  Vitals Value Taken Time  BP    Temp    Pulse 71 08/21/2018  1:58 PM  Resp 23 08/21/2018  1:58 PM  SpO2 100 % 08/21/2018  1:58 PM  Vitals shown include unvalidated device data.  Last Pain:  Vitals:   08/21/18 1304  TempSrc: Oral  PainSc: 0-No pain         Complications: No apparent anesthesia complications

## 2018-08-21 NOTE — Op Note (Addendum)
[facility] Patient Name: Cindy Gutierrez Procedure Date: 08/21/2018 1:31 PM MRN: [id number] Account #: [id number] Date of Birth: [birth year] Admit Type: Outpatient Age: 57 Room: [facility] ENDO ROOM 4 Gender: Female Note Status: Finalized Procedure:            Colonoscopy Indications:          Screening for colorectal malignant neoplasm, This is                        the patient's first colonoscopy Providers:            Lin Landsman MD, MD Referring MD:         Perrin Maltese, MD (Referring MD) Medicines:            Monitored Anesthesia Care Complications:        No immediate complications. Estimated blood loss: None. Procedure:            Pre-Anesthesia Assessment:                       - Prior to the procedure, a History and Physical was                        performed, and patient medications and allergies were                        reviewed. The patient is competent. The risks and                        benefits of the procedure and the sedation options and                        risks were discussed with the patient. All questions                        were answered and informed consent was obtained.                        Patient identification and proposed procedure were                        verified by the physician, the nurse, the                        anesthesiologist, the anesthetist and the technician in                        the pre-procedure area in the procedure room in the                        endoscopy suite. Mental Status Examination: alert and                        oriented. Airway Examination: normal oropharyngeal                        airway and neck mobility. Respiratory Examination:                        clear to auscultation. CV Examination: normal.  Prophylactic Antibiotics: The patient does not require                        prophylactic antibiotics. Prior Anticoagulants: The                         patient has taken no previous anticoagulant or                        antiplatelet agents. ASA Grade Assessment: II - A                        patient with mild systemic disease. After reviewing the                        risks and benefits, the patient was deemed in                        satisfactory condition to undergo the procedure. The                        anesthesia plan was to use monitored anesthesia care                        (MAC). Immediately prior to administration of                        medications, the patient was re-assessed for adequacy                        to receive sedatives. The heart rate, respiratory rate,                        oxygen saturations, blood pressure, adequacy of                        pulmonary ventilation, and response to care were                        monitored throughout the procedure. The physical status                        of the patient was re-assessed after the procedure.                       After obtaining informed consent, the colonoscope was                        passed under direct vision. Throughout the procedure,                        the patient's blood pressure, pulse, and oxygen                        saturations were monitored continuously. The                        Colonoscope was introduced through the anus and  advanced to the the cecum, identified by appendiceal                        orifice and ileocecal valve. The colonoscopy was                        performed without difficulty. The patient tolerated the                        procedure well. The quality of the bowel preparation                        was evaluated using the BBPS [facility] Bowel Preparation                        Scale) with scores of: Right Colon = 3, Transverse                        Colon = 3 and Left Colon = 3 (entire mucosa seen well                        with no residual staining, small fragments of stool or                         opaque liquid). The total BBPS score equals 9. Findings:      The perianal and digital rectal examinations were normal. Pertinent       negatives include normal sphincter tone and no palpable rectal lesions.      Two sessile polyps were found in the rectum and sigmoid colon. The       polyps were 5 mm in size. These polyps were removed with a cold snare.       Resection and retrieval were complete.      The exam was otherwise without abnormality.      The retroflexed view of the distal rectum and anal verge was normal and       showed no anal or rectal abnormalities. Impression:           - Two 5 mm polyps in the rectum and in the sigmoid                        colon, removed with a cold snare. Resected and                        retrieved.                       - The examination was otherwise normal.                       - The distal rectum and anal verge are normal on                        retroflexion view. Recommendation:       - Discharge patient to home (with escort).                       - Resume previous diet today.                       -  Continue present medications.                       - Await pathology results.                       - Repeat colonoscopy in 5-10 years for surveillance                        based on pathology results.                       - Return to my office as previously scheduled. Procedure Code(s):    --- Professional ---                       [id number], Colonoscopy, flexible; with removal of tumor(s),                        polyp(s), or other lesion(s) by snare technique Diagnosis Code(s):    --- Professional ---                       Z12.11, Encounter for screening for malignant neoplasm                        of colon                       K62.1, Rectal polyp                       D12.5, Benign neoplasm of sigmoid colon CPT copyright 2018 American Medical Association. All rights reserved. The codes documented in this report  are preliminary and upon coder review may  be revised to meet current compliance requirements. Dr. Ulyess Mort Lin Landsman MD, MD 08/21/2018 1:56:19 PM This report has been signed electronically. Number of Addenda: 0 Note Initiated On: 08/21/2018 1:31 PM Scope Withdrawal Time: 0 hours 13 minutes 2 seconds  Total Procedure Duration: 0 hours 17 minutes 3 seconds       [facility]

## 2018-08-21 NOTE — H&P (Signed)
Cephas Darby, MD [address]  [place], [place] [address]  Main: [phone]  Fax: [phone] Pager: [phone]  Primary Care Physician:  Danelle Berry, NP Primary Gastroenterologist:  Dr. Cephas Darby  Pre-Procedure History & Physical: HPI:  Cindy Gutierrez is a 57 y.o. female is here for an colonoscopy.   Past Medical History:  Diagnosis Date  . COPD (chronic obstructive pulmonary disease) ([place])   . Hypertension     Past Surgical History:  Procedure Laterality Date  . TUBAL LIGATION      Prior to Admission medications   Medication Sig Start Date End Date Taking? Authorizing Provider  albuterol (PROVENTIL HFA;VENTOLIN HFA) 108 (90 Base) MCG/ACT inhaler Inhale 1-2 puffs into the lungs every 6 (six) hours as needed for wheezing or shortness of breath. 06/26/16  Yes Hagler, Jami L, PA-C  amLODipine (NORVASC) 10 MG tablet Take by mouth. 03/09/17  Yes [provider]  predniSONE (DELTASONE) 10 MG tablet Take a daily regimen of 5,4,3,2,1 (first dose was received in the emergency department) 06/26/16  Yes Hagler, Jami L, PA-C  amLODipine (NORVASC) 5 MG tablet Take 1 tablet (5 mg total) by mouth once. Patient not taking: Reported on 08/07/2018 12/26/14   Everlene Balls, MD  budesonide-formoterol [facility]) 80-4.5 MCG/ACT inhaler Inhale 2 puffs into the lungs 2 (two) times daily. 06/26/16 07/26/16  Hagler, Jami L, PA-C  hydrochlorothiazide (HYDRODIURIL) 25 MG tablet Take by mouth. 10/20/17 10/20/18  [provider]  ipratropium-albuterol (DUONEB) 0.5-2.5 (3) MG/3ML SOLN Take 3 mLs by nebulization every 6 (six) hours as needed. 06/26/16   Hagler, Jami L, PA-C  losartan (COZAAR) 100 MG tablet Take by mouth. 05/23/17   [provider]    Allergies as of 08/07/2018 - Review Complete 08/07/2018  Allergen Reaction Noted  . Sulfa antibiotics Hives and Rash 07/26/2015  . Bee pollen Hives 07/26/2015  . Lisinopril Nausea Only 12/18/2017  .  Penicillins Hives 07/26/2015    History reviewed. No pertinent family history.  Social History   Socioeconomic History  . Marital status: Single    Spouse name: Not on file  . Number of children: Not on file  . Years of education: Not on file  . Highest education level: Not on file  Occupational History  . Not on file  Social Needs  . Financial resource strain: Not on file  . Food insecurity:    Worry: Not on file    Inability: Not on file  . Transportation needs:    Medical: Not on file    Non-medical: Not on file  Tobacco Use  . Smoking status: Current Every Day Smoker    Packs/day: 0.50    Types: Cigarettes  . Smokeless tobacco: Never Used  Substance and Sexual Activity  . Alcohol use: Yes  . Drug use: No  . Sexual activity: Not on file  Lifestyle  . Physical activity:    Days per week: Not on file    Minutes per session: Not on file  . Stress: Not on file  Relationships  . Social connections:    Talks on phone: Not on file    Gets together: Not on file    Attends religious service: Not on file    Active member of club or organization: Not on file    Attends meetings of clubs or organizations: Not on file    Relationship status: Not on file  . Intimate partner violence:    Fear of current or  ex partner: Not on file    Emotionally abused: Not on file    Physically abused: Not on file    Forced sexual activity: Not on file  Other Topics Concern  . Not on file  Social History Narrative  . Not on file    Review of Systems: See HPI, otherwise negative ROS  Physical Exam: BP (!) 156/68   Pulse 72   Temp 98.3 F (36.8 C) (Oral)   Resp 18   Ht 5\' 10"  (1.778 m)   Wt 64.4 kg   SpO2 100%   BMI 20.37 kg/m  General:   Alert,  pleasant and cooperative in NAD Head:  Normocephalic and atraumatic. Neck:  Supple; no masses or thyromegaly. Lungs:  Clear throughout to auscultation.    Heart:  Regular rate and rhythm. Abdomen:  Soft, nontender and  nondistended. Normal bowel sounds, without guarding, and without rebound.   Neurologic:  Alert and  oriented x4;  grossly normal neurologically.  Impression/Plan: Cindy Gutierrez is here for an colonoscopy to be performed for colon cancer screening  Risks, benefits, limitations, and alternatives regarding  colonoscopy have been reviewed with the patient.  Questions have been answered.  All parties agreeable.   Sherri Sear, MD  08/21/2018, 1:18 PM

## 2018-08-21 NOTE — Anesthesia Preprocedure Evaluation (Signed)
Anesthesia Evaluation  Patient identified by MRN, date of birth, ID band Patient awake    Reviewed: Allergy & Precautions, NPO status , Patient's Chart, lab work & pertinent test results  History of Anesthesia Complications Negative for: history of anesthetic complications  Airway Mallampati: II  TM Distance: >3 FB Neck ROM: Full    Dental  (+) Missing, Poor Dentition   Pulmonary neg sleep apnea, COPD,  COPD inhaler, Current Smoker,    breath sounds clear to auscultation- rhonchi (-) wheezing      Cardiovascular Exercise Tolerance: Good hypertension, Pt. on medications (-) CAD, (-) Past MI, (-) Cardiac Stents and (-) CABG  Rhythm:Regular Rate:Normal - Systolic murmurs and - Diastolic murmurs    Neuro/Psych negative neurological ROS  negative psych ROS   GI/Hepatic negative GI ROS, Neg liver ROS,   Endo/Other  negative endocrine ROSneg diabetes  Renal/GU negative Renal ROS     Musculoskeletal negative musculoskeletal ROS (+)   Abdominal (+) - obese,   Peds  Hematology negative hematology ROS (+)   Anesthesia Other Findings Past Medical History: No date: COPD (chronic obstructive pulmonary disease) (H[facility]) No date: Hypertension   Reproductive/Obstetrics                             Anesthesia Physical Anesthesia Plan  ASA: II  Anesthesia Plan: General   Post-op Pain Management:    Induction: Intravenous  PONV Risk Score and Plan: 1 and Propofol infusion  Airway Management Planned: Natural Airway  Additional Equipment:   Intra-op Plan:   Post-operative Plan:   Informed Consent: I have reviewed the patients History and Physical, chart, labs and discussed the procedure including the risks, benefits and alternatives for the proposed anesthesia with the patient or authorized representative who has indicated his/her understanding and acceptance.   Dental advisory given  Plan  Discussed with: CRNA and Anesthesiologist  Anesthesia Plan Comments:         Anesthesia Quick Evaluation

## 2018-08-21 NOTE — Anesthesia Postprocedure Evaluation (Signed)
Anesthesia Post Note  Patient: Cindy Gutierrez  Procedure(s) Performed: COLONOSCOPY WITH PROPOFOL (N/A )  Patient location during evaluation: Endoscopy Anesthesia Type: General Level of consciousness: awake and alert and oriented Pain management: pain level controlled Vital Signs Assessment: post-procedure vital signs reviewed and stable Respiratory status: spontaneous breathing, nonlabored ventilation and respiratory function stable Cardiovascular status: blood pressure returned to baseline and stable Postop Assessment: no signs of nausea or vomiting Anesthetic complications: no     Last Vitals:  Vitals:   08/21/18 1358 08/21/18 1408  BP: 126/66 (!) 144/64  Pulse: 72 65  Resp: 20 (!) 23  Temp:    SpO2: 100% 100%    Last Pain:  Vitals:   08/21/18 1408  TempSrc:   PainSc: 0-No pain                 Shyane Fossum

## 2018-08-22 ENCOUNTER — Ambulatory Visit
Admission: RE | Admit: 2018-08-22 | Discharge: 2018-08-22 | Disposition: A | Discharge status: Home / Self Care | Payer: Medicaid Other | Source: Ambulatory Visit | Attending: Nurse Practitioner | Admitting: Nurse Practitioner

## 2018-08-22 DIAGNOSIS — Z1231 Encounter for screening mammogram for malignant neoplasm of breast: Secondary | ICD-10-CM | POA: Insufficient documentation

## 2018-08-23 ENCOUNTER — Encounter: Payer: Self-pay | Admitting: Gastroenterology

## 2018-08-23 LAB — SURGICAL PATHOLOGY

## 2018-08-24 ENCOUNTER — Encounter: Payer: Self-pay | Admitting: Gastroenterology

## 2018-10-08 ENCOUNTER — Ambulatory Visit: Payer: Medicaid Other | Admitting: Gastroenterology

## 2018-10-16 ENCOUNTER — Ambulatory Visit: Payer: Medicaid Other | Admitting: Gastroenterology

## 2018-10-16 ENCOUNTER — Other Ambulatory Visit: Payer: Self-pay

## 2019-11-14 ENCOUNTER — Emergency Department: Payer: No Typology Code available for payment source

## 2019-11-14 ENCOUNTER — Other Ambulatory Visit: Payer: Self-pay

## 2019-11-14 ENCOUNTER — Emergency Department
Admission: EM | Admit: 2019-11-14 | Discharge: 2019-11-14 | Disposition: A | Discharge status: Home / Self Care | Payer: No Typology Code available for payment source | Attending: Emergency Medicine | Admitting: Emergency Medicine

## 2019-11-14 DIAGNOSIS — F17210 Nicotine dependence, cigarettes, uncomplicated: Secondary | ICD-10-CM | POA: Insufficient documentation

## 2019-11-14 DIAGNOSIS — R0789 Other chest pain: Secondary | ICD-10-CM | POA: Insufficient documentation

## 2019-11-14 DIAGNOSIS — Z79899 Other long term (current) drug therapy: Secondary | ICD-10-CM | POA: Insufficient documentation

## 2019-11-14 DIAGNOSIS — J441 Chronic obstructive pulmonary disease with (acute) exacerbation: Secondary | ICD-10-CM | POA: Diagnosis not present

## 2019-11-14 DIAGNOSIS — R0602 Shortness of breath: Secondary | ICD-10-CM | POA: Diagnosis present

## 2019-11-14 DIAGNOSIS — I10 Essential (primary) hypertension: Secondary | ICD-10-CM | POA: Insufficient documentation

## 2019-11-14 LAB — BASIC METABOLIC PANEL
Anion gap: 7 (ref 5–15)
BUN: 12 mg/dL (ref 6–20)
CO2: 27 mmol/L (ref 22–32)
Calcium: 10.5 mg/dL — ABNORMAL HIGH (ref 8.9–10.3)
Chloride: 107 mmol/L (ref 98–111)
Creatinine, Ser: 0.78 mg/dL (ref 0.44–1.00)
GFR calc Af Amer: >60 mL/min (ref >60)
GFR calc non Af Amer: >60 mL/min (ref >60)
Glucose, Bld: 105 mg/dL — ABNORMAL HIGH (ref 70–99)
Potassium: 3.9 mmol/L (ref 3.5–5.1)
Sodium: 141 mmol/L (ref 135–145)

## 2019-11-14 LAB — CBC
HCT: 44.5 % (ref 36.0–46.0)
Hemoglobin: 14.2 g/dL (ref 12.0–15.0)
MCH: 27.5 pg (ref 26.0–34.0)
MCHC: 31.9 g/dL (ref 30.0–36.0)
MCV: 86.1 fL (ref 80.0–100.0)
Platelets: 233 10*3/uL (ref 150–400)
RBC: 5.17 MIL/uL — ABNORMAL HIGH (ref 3.87–5.11)
RDW: 12.7 % (ref 11.5–15.5)
WBC: 4.4 10*3/uL (ref 4.0–10.5)
nRBC: 0 % (ref 0.0–0.2)

## 2019-11-14 MED ORDER — PREDNISONE 10 MG PO TABS: 50.0000 mg | ORAL_TABLET | Freq: Every day | ORAL | 0 refills | Status: DC

## 2019-11-14 MED ORDER — METHYLPREDNISOLONE SODIUM SUCC 125 MG IJ SOLR
60.0000 mg | Freq: Once | INTRAMUSCULAR | Status: AC
  Administered 2019-11-14: 14:00:00 60 mg via INTRAMUSCULAR
  Filled 2019-11-14: qty 2

## 2019-11-14 MED ORDER — AZITHROMYCIN 250 MG PO TABS: ORAL_TABLET | ORAL | 0 refills | Status: DC

## 2019-11-14 NOTE — Discharge Instructions (Signed)
Please follow up with your primary care provider for symptoms that are not improving over the next few days.  Return to the ER for symptoms that change or worsen if unable to schedule an appointment. 

## 2019-11-14 NOTE — ED Provider Notes (Signed)
[facility] Emergency Department Provider Note ____________________________________________   First MD Initiated Contact with Patient 11/14/19 1337     (approximate)  I have reviewed the triage vital signs and the nursing notes.   HISTORY  Chief Complaint Shortness of Breath  HPI Cindy Gutierrez is a 59 y.o. female who presents to the emergency department for treatment and evaluation of shortness of breath.  Patient states that she has COPD and has been having to use her albuterol inhaler more often over the past few days.  Patient states that she is having some intermittent pain in her "left lung."  She states that this happens to her "every year."  She denies having a fever.  No known COVID-19 exposure.  No cough.  No chest pain.  Patient is requesting prednisone and a Z-Pak.  She states that her symptoms today are identical to her symptoms in the past and these medications have provided her with relief.      Past Medical History:  Diagnosis Date  . COPD (chronic obstructive pulmonary disease) ([place])   . Hypertension     Patient Active Problem List   Diagnosis Date Noted  . Colon cancer screening   . Heart murmur 10/20/2017  . Chronic obstructive pulmonary disease, unspecified ([place]) 06/13/2017  . Essential hypertension 06/13/2017  . Tobacco use 06/13/2017  . Herpes simplex 05/31/2017  . 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 Lipoma 05/30/2017    Past Surgical History:  Procedure Laterality Date  . COLONOSCOPY WITH PROPOFOL N/A 08/21/2018   Procedure: COLONOSCOPY WITH PROPOFOL;  Surgeon: Lin Landsman, MD;  Location: [facility] ENDOSCOPY;  Service: Gastroenterology;  Laterality: N/A;  . TUBAL LIGATION      Prior to Admission medications   Medication Sig Start Date End Date Taking? Authorizing Provider  albuterol (PROVENTIL HFA;VENTOLIN HFA) 108 (90 Base) MCG/ACT inhaler Inhale 1-2 puffs into the lungs every 6 (six) hours as needed for  wheezing or shortness of breath. 06/26/16   Hagler, Jami L, PA-C  amLODipine (NORVASC) 10 MG tablet Take by mouth. 03/09/17   [provider]  azithromycin (ZITHROMAX) 250 MG tablet 2 tablets today, then 1 tablet for the next 4 days. 11/14/19   [place] Strieter, [name], FNP  budesonide-formoterol (SYMBICORT) 80-4.5 MCG/ACT inhaler Inhale 2 puffs into the lungs 2 (two) times daily. 06/26/16 07/26/16  Hagler, Jami L, PA-C  hydrochlorothiazide (HYDRODIURIL) 25 MG tablet Take by mouth. 10/20/17 10/20/18  [provider]  ipratropium-albuterol (DUONEB) 0.5-2.5 (3) MG/3ML SOLN Take 3 mLs by nebulization every 6 (six) hours as needed. 06/26/16   Hagler, Jami L, PA-C  losartan (COZAAR) 100 MG tablet Take by mouth. 05/23/17   [provider]  metoprolol tartrate (LOPRESSOR) 100 MG tablet USE UTD - TK 1 T QPM BEFORE AND TK 1 T 90 MINUTES PRIOR TO CTA CORONARIES 08/07/18   [provider]  predniSONE (DELTASONE) 10 MG tablet Take 5 tablets (50 mg total) by mouth daily. 11/14/19   Juventino Pavone, [name], FNP    Allergies Sulfa antibiotics, Bee pollen, Lisinopril, and Penicillins  No family history on file.  Social History Social History   Tobacco Use  . Smoking status: Current Every Day Smoker    Packs/day: 0.50    Types: Cigarettes  . Smokeless tobacco: Never Used  Substance Use Topics  . Alcohol use: Yes  . Drug use: No    Review of Systems  Constitutional: No fever/chills Eyes: No visual changes. ENT: No sore throat.  Cardiovascular: Denies chest pain. Respiratory: Positive for shortness of breath. Gastrointestinal: No abdominal pain.  No nausea, no vomiting.  No diarrhea.  No constipation. Genitourinary: Negative for dysuria. Musculoskeletal: Negative for back pain. Skin: Negative for rash. Neurological: Negative for headaches, focal weakness or numbness. ____________________________________________   PHYSICAL EXAM:  VITAL SIGNS: ED Triage Vitals  Enc Vitals Group      BP 11/14/19 1229 (!) 176/61     Pulse Rate 11/14/19 1229 83     Resp 11/14/19 1229 18     Temp 11/14/19 1229 98.6 F (37 C)     Temp Source 11/14/19 1229 Oral     SpO2 11/14/19 1229 99 %     Weight 11/14/19 1229 140 lb (63.5 kg)     Height 11/14/19 1229 5\' 10"  (1.778 m)     Head Circumference --      Peak Flow --      Pain Score 11/14/19 1243 3     Pain Loc --      Pain Edu? --      Excl. in [place]? --     Constitutional: Alert and oriented. Well appearing and in no acute distress. Eyes: Conjunctivae are normal. Head: Atraumatic. Nose: No congestion/rhinnorhea. Mouth/Throat: Mucous membranes are moist.  Neck: No stridor.   Hematological/Lymphatic/Immunilogical: No cervical lymphadenopathy. Cardiovascular: Normal rate, regular rhythm. Grossly normal heart sounds.  Good peripheral circulation. Respiratory: Normal respiratory effort.  No retractions.  Expiratory wheeze noted in bilateral bases. Gastrointestinal: Soft and nontender. No distention.  Genitourinary:  Musculoskeletal: No lower extremity tenderness nor edema.  Neurologic:  Normal speech and language. No gross focal neurologic deficits are appreciated. No gait instability. Skin:  Skin is warm, dry and intact. No rash noted. Psychiatric: Mood and affect are normal. Speech and behavior are normal.  ____________________________________________   LABS (all labs ordered are listed, but only abnormal results are displayed)  Labs Reviewed  CBC - Abnormal; Notable for the following components:      Result Value   RBC 5.17 (*)    All other components within normal limits  BASIC METABOLIC PANEL - Abnormal; Notable for the following components:   Glucose, Bld 105 (*)    Calcium 10.5 (*)    All other components within normal limits   ____________________________________________  EKG  ED ECG REPORT I, Iyanah Demont, FNP-BC personally viewed and interpreted this ECG.   Date: 11/14/2019  EKG Time: 1234  Rate: 87   Rhythm: unchanged from previous tracings, normal sinus rhythm  Axis: normal  Intervals:left anterior fascicular block  ST&T Change: no ST elevation  ____________________________________________  RADIOLOGY  ED MD interpretation:    Hyperinflation bilateral, no evidence of pneumonia.  I, Sherrie George, personally viewed and evaluated these images (plain radiographs) as part of my medical decision making, as well as reviewing the written report by the radiologist.  Official radiology report(s): DG Chest 2 View  Result Date: 11/14/2019 CLINICAL DATA:  Pt comes via POV from home with c/o SOB. COPD. EXAM: CHEST - 2 VIEW COMPARISON:  Chest radiograph 12/18/2017 FINDINGS: The heart size and mediastinal contours are within normal limits. The lungs are hyperinflated. Unchanged linear opacity at the left lung base likely reflect scarring. No new focal opacity bilaterally. There is mild prominence of the interstitium bilaterally likely representing chronic bronchitic change. No pneumothorax or pleural effusion. Old left rib fracture. IMPRESSION: No active cardiopulmonary disease. COPD and chronic bronchitic change. Electronically Signed   By: Audie Pinto M.D.   On:  11/14/2019 13:13    ____________________________________________   PROCEDURES  Procedure(s) performed (including Critical Care):  Procedures  ____________________________________________   INITIAL IMPRESSION / ASSESSMENT AND PLAN   59 year old female presenting to the emergency department for treatment and evaluation of shortness of breath.  Patient feels that this is an acute on chronic COPD exacerbation.  See HPI for further details.  Plan will be to review labs, EKG, and x-ray that was completed while awaiting ER room assignment.   DIFFERENTIAL DIAGNOSIS  COPD exacerbation, COVID-19, URI, pneumonia, cardiac event  ED COURSE  Lab studies are all reassuring.  Chest x-ray shows no acute change.  Patient does have COPD  at baseline and is does appear to be an exacerbation.  She has not had any steroids or antibiotics recently.  Her request for prednisone and azithromycin seems reasonable.  These will be sent to her pharmacy.  She was encouraged to continue using her inhalers as prescribed as well.  She will be given 1 dose of Solu-Medrol while here due to expiratory wheeze.  She will also use her inhaler while here.  She will follow up with her primary care provider for symptoms that are not improving over the next few days.  If her symptoms change or worsen, she is to return to the emergency department if she is unable to schedule an appointment. ____________________________________________   FINAL CLINICAL IMPRESSION(S) / ED DIAGNOSES  Final diagnoses:  COPD exacerbation [facility])     ED Discharge Orders         Ordered    azithromycin (ZITHROMAX) 250 MG tablet     11/14/19 1346    predniSONE (DELTASONE) 10 MG tablet  Daily     11/14/19 1346           RAE MASLOWSKI was evaluated in Emergency Department on 11/14/2019 for the symptoms described in the history of present illness. She was evaluated in the context of the global COVID-19 pandemic, which necessitated consideration that the patient might be at risk for infection with the SARS-CoV-2 virus that causes COVID-19. Institutional protocols and algorithms that pertain to the evaluation of patients at risk for COVID-19 are in a state of rapid change based on information released by regulatory bodies including the CDC and federal and state organizations. These policies and algorithms were followed during the patient's care in the ED.   Note:  This document was prepared using Dragon voice recognition software and may include unintentional dictation errors.   Victorino Dike, FNP 11/14/19 1446    Arta Silence, MD 11/14/19 1600

## 2019-11-14 NOTE — ED Triage Notes (Signed)
Pt comes via POV from home with c/o SOB. Pt states she has COPD and she feels like her left lung is hurting. Pt states she just need a Zpack and some Presidone and she will be good to go.  Pt denies any CP.

## 2020-08-28 ENCOUNTER — Other Ambulatory Visit: Payer: Self-pay

## 2020-08-28 ENCOUNTER — Encounter: Payer: Self-pay | Admitting: Emergency Medicine

## 2020-08-28 ENCOUNTER — Ambulatory Visit
Admission: EM | Admit: 2020-08-28 | Discharge: 2020-08-28 | Disposition: A | Discharge status: Home / Self Care | Payer: Medicaid Other | Attending: Emergency Medicine | Admitting: Emergency Medicine

## 2020-08-28 DIAGNOSIS — F17210 Nicotine dependence, cigarettes, uncomplicated: Secondary | ICD-10-CM | POA: Diagnosis not present

## 2020-08-28 DIAGNOSIS — J441 Chronic obstructive pulmonary disease with (acute) exacerbation: Secondary | ICD-10-CM | POA: Diagnosis not present

## 2020-08-28 DIAGNOSIS — R059 Cough, unspecified: Secondary | ICD-10-CM | POA: Diagnosis not present

## 2020-08-28 DIAGNOSIS — Z20822 Contact with and (suspected) exposure to covid-19: Secondary | ICD-10-CM | POA: Diagnosis not present

## 2020-08-28 DIAGNOSIS — R0602 Shortness of breath: Secondary | ICD-10-CM

## 2020-08-28 DIAGNOSIS — Z7951 Long term (current) use of inhaled steroids: Secondary | ICD-10-CM | POA: Insufficient documentation

## 2020-08-28 DIAGNOSIS — Z79899 Other long term (current) drug therapy: Secondary | ICD-10-CM | POA: Insufficient documentation

## 2020-08-28 LAB — RESP PANEL BY RT-PCR (FLU A&B, COVID) ARPGX2
Influenza A by PCR: NEGATIVE
Influenza B by PCR: NEGATIVE
SARS Coronavirus 2 by RT PCR: NEGATIVE

## 2020-08-28 MED ORDER — AZITHROMYCIN 250 MG PO TABS: 250.0000 mg | ORAL_TABLET | Freq: Every day | ORAL | 0 refills | Status: DC

## 2020-08-28 MED ORDER — PREDNISONE 20 MG PO TABS: 40.0000 mg | ORAL_TABLET | Freq: Every day | ORAL | 0 refills | Status: AC

## 2020-08-28 NOTE — ED Provider Notes (Signed)
[facility]    CSN: [id number] Arrival date & time: 08/28/20  1027      History   Chief Complaint Chief Complaint  Patient presents with  . Cough  . Shortness of Breath    HPI Cindy Gutierrez is a 59 y.o. female with history of COPD presenting for productive cough, congestion and increased shortness of breath over the past 5 days.  Patient states she has multiple inhalers at home including Spiriva, albuterol, and Symbicort.  States she has been using her inhalers believes she may need antibiotics and prednisone at this time.  Patient states that she gets this in a year and usually requires azithromycin and prednisone.  Patient denies any associated fever.  She has has been fatigued and admits to mild sore throat and nasal congestion.  Denies any abdominal pain, nausea/vomiting or diarrhea.  Patient denies any known Covid exposure and is fully vaccinated for Covid.  She has taken any cough medication.  Other past medical history is negative for hypertension.  Patient has no other complaints or concerns.  HPI  Past Medical History:  Diagnosis Date  . COPD (chronic obstructive pulmonary disease) ([place])   . Hypertension     Patient Active Problem List   Diagnosis Date Noted  . Colon cancer screening   . Heart murmur 10/20/2017  . Chronic obstructive pulmonary disease, unspecified ([place]) 06/13/2017  . Essential hypertension 06/13/2017  . Tobacco use 06/13/2017  . Herpes simplex 05/31/2017  . 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 Lipoma 05/30/2017    Past Surgical History:  Procedure Laterality Date  . COLONOSCOPY WITH PROPOFOL N/A 08/21/2018   Procedure: COLONOSCOPY WITH PROPOFOL;  Surgeon: Lin Landsman, MD;  Location: [facility];  Service: Gastroenterology;  Laterality: N/A;  . TUBAL LIGATION      OB History   No obstetric history on file.      Home Medications    Prior to Admission medications   Medication Sig Start Date End Date  Taking? Authorizing Provider  albuterol (PROVENTIL HFA;VENTOLIN HFA) 108 (90 Base) MCG/ACT inhaler Inhale 1-2 puffs into the lungs every 6 (six) hours as needed for wheezing or shortness of breath. 06/26/16  Yes Hagler, Jami L, PA-C  amLODipine (NORVASC) 10 MG tablet Take by mouth. 03/09/17  Yes [provider]  budesonide-formoterol (SYMBICORT) 80-4.5 MCG/ACT inhaler Inhale 2 puffs into the lungs 2 (two) times daily. 06/26/16 08/28/20 Yes Hagler, Jami L, PA-C  azithromycin (ZITHROMAX) 250 MG tablet Take 1 tablet (250 mg total) by mouth daily. Take first 2 tablets together, then 1 every day until finished. 08/28/20   Danton Clap, PA-C  hydrochlorothiazide (HYDRODIURIL) 25 MG tablet Take by mouth. 10/20/17 10/20/18  [provider]  ipratropium-albuterol (DUONEB) 0.5-2.5 (3) MG/3ML SOLN Take 3 mLs by nebulization every 6 (six) hours as needed. 06/26/16   Hagler, Jami L, PA-C  losartan (COZAAR) 100 MG tablet Take by mouth. 05/23/17   [provider]  metoprolol tartrate (LOPRESSOR) 100 MG tablet USE UTD - TK 1 T QPM BEFORE AND TK 1 T 90 MINUTES PRIOR TO CTA CORONARIES 08/07/18   [provider]  predniSONE (DELTASONE) 20 MG tablet Take 2 tablets (40 mg total) by mouth daily for 5 days. 08/28/20 09/02/20  Danton Clap, PA-C    Family History History reviewed. No pertinent family history.  Social History Social History   Tobacco Use  . Smoking status: Current Every Day Smoker    Packs/day: 0.50  Types: Cigarettes  . Smokeless tobacco: Never Used  Vaping Use  . Vaping Use: Never used  Substance Use Topics  . Alcohol use: Yes  . Drug use: No     Allergies   Sulfa antibiotics, Bee pollen, Lisinopril, and Penicillins   Review of Systems Review of Systems  Constitutional: Positive for fatigue. Negative for chills, diaphoresis and fever.  HENT: Positive for congestion, rhinorrhea and sore throat. Negative for ear pain, sinus pressure and sinus pain.    Respiratory: Positive for cough and shortness of breath.   Gastrointestinal: Negative for abdominal pain, nausea and vomiting.  Musculoskeletal: Negative for arthralgias and myalgias.  Skin: Negative for rash.  Neurological: Negative for weakness and headaches.  Hematological: Negative for adenopathy.     Physical Exam Triage Vital Signs ED Triage Vitals  Enc Vitals Group     BP 08/28/20 1108 (!) 171/62     Pulse Rate 08/28/20 1108 72     Resp 08/28/20 1108 16     Temp 08/28/20 1108 98.4 F (36.9 C)     Temp Source 08/28/20 1108 Oral     SpO2 08/28/20 1108 100 %     Weight 08/28/20 1103 160 lb (72.6 kg)     Height 08/28/20 1103 5\' 10"  (1.778 m)     Head Circumference --      Peak Flow --      Pain Score 08/28/20 1103 4     Pain Loc --      Pain Edu? --      Excl. in [place]? --    No data found.  Updated Vital Signs BP (!) 171/62 (BP Location: Left Arm)   Pulse 72   Temp 98.4 F (36.9 C) (Oral)   Resp 16   Ht 5\' 10"  (1.778 m)   Wt 160 lb (72.6 kg)   SpO2 100%   BMI 22.96 kg/m       Physical Exam Vitals and nursing note reviewed.  Constitutional:      General: She is not in acute distress.    Appearance: Normal appearance. She is well-developed. She is not ill-appearing or toxic-appearing.  HENT:     Head: Normocephalic and atraumatic.     Nose: Rhinorrhea present.     Mouth/Throat:     Mouth: Mucous membranes are moist.     Pharynx: Oropharynx is clear. Posterior oropharyngeal erythema present.  Eyes:     General: No scleral icterus.       Right eye: No discharge.        Left eye: No discharge.     Conjunctiva/sclera: Conjunctivae normal.  Cardiovascular:     Rate and Rhythm: Normal rate and regular rhythm.     Heart sounds: Normal heart sounds.  Pulmonary:     Effort: No respiratory distress.     Breath sounds: No wheezing, rhonchi or rales.     Comments: Mildly increased respirations at 23 Musculoskeletal:     Cervical back: Neck supple.  Skin:     General: Skin is dry.  Neurological:     General: No focal deficit present.     Mental Status: She is alert. Mental status is at baseline.     Motor: No weakness.     Gait: Gait normal.  Psychiatric:        Mood and Affect: Mood normal.        Behavior: Behavior normal.        Thought Content: Thought content normal.  UC Treatments / Results  Labs (all labs ordered are listed, but only abnormal results are displayed) Labs Reviewed  RESP PANEL BY RT-PCR (FLU A&B, COVID) [id number]    EKG   Radiology No results found.  Procedures Procedures (including critical care time)  Medications Ordered in UC Medications - No data to display  Initial Impression / Assessment and Plan / UC Course  I have reviewed the triage vital signs and the nursing notes.  Pertinent labs & imaging results that were available during my care of the patient were reviewed by me and considered in my medical decision making (see chart for details).    59 year old female with COPD presenting for minimally productive cough and congestion over the past 5 days.  Patient denies fevers and patient is afebrile in clinic.  Admits to increased shortness of breath.  Oxygen is 100% in clinic and chest is clear to auscultation.  Mildly increased respiratory rate.  Blood pressure elevated 171/62.  Patient denies Covid exposure and fully vaccinated.  Respiratory panel obtained to assess for potential flu or Covid.  Advised patient I will call her with the results if positive.  Treating COPD exacerbation with azithromycin and prednisone.  Advised over-the-counter Mucinex and increasing rest and fluids.  ED precautions discussed with patient.  Final Clinical Impressions(s) / UC Diagnoses   Final diagnoses:  COPD exacerbation (HCC)  Cough  Shortness of breath     Discharge Instructions     COPD EXACERBATION: Continue use of your at home inhalers.  Start prednisone and azithromycin to treat for your exacerbation.   Increase fluid intake and rest.  Take over-the-counter Mucinex to help with the mucus.  I will call with the results of your respiratory panel nasal swab.  If you do test positive for Covid I can refer you to the infusion center for an IV therapy.  Please emergency department if you have any fever, worsening cough, increased chest pain or worsening breathing difficulty.  You have received COVID testing today either for positive exposure, concerning symptoms that could be related to COVID infection, screening purposes, or re-testing after confirmed positive.  Your test obtained today checks for active viral infection in the last 1-2 weeks. If your test is negative now, you can still test positive later. So, if you do develop symptoms you should either get re-tested and/or isolate x 10 days. Please follow CDC guidelines.  While Rapid antigen tests come back in 15-20 minutes, send out PCR/molecular test results typically come back within 24 hours. In the mean time, if you are symptomatic, assume this could be a positive test and treat/monitor yourself as if you do have COVID.   We will call with test results. Please download the MyChart app and set up a profile to access test results.   If symptomatic, go home and rest. Push fluids. Take Tylenol as needed for discomfort. Gargle warm salt water. Throat lozenges. Take Mucinex DM or Robitussin for cough. Humidifier in bedroom to ease coughing. Warm showers. Also review the COVID handout for more information.  COVID-19 INFECTION: The incubation period of COVID-19 is approximately 14 days after exposure, with most symptoms developing in roughly 4-5 days. Symptoms may range in severity from mild to critically severe. Roughly 80% of those infected will have mild symptoms. People of any age may become infected with COVID-19 and have the ability to transmit the virus. The most common symptoms include: fever, fatigue, cough, body aches, headaches, sore throat, nasal  congestion, shortness of breath,  nausea, vomiting, diarrhea, changes in smell and/or taste.    COURSE OF ILLNESS Some patients may begin with mild disease which can progress quickly into critical symptoms. If your symptoms are worsening please call ahead to the Emergency Department and proceed there for further treatment. Recovery time appears to be roughly 1-2 weeks for mild symptoms and 3-6 weeks for severe disease.   GO IMMEDIATELY TO ER FOR FEVER YOU ARE UNABLE TO GET DOWN WITH TYLENOL, BREATHING PROBLEMS, CHEST PAIN, FATIGUE, LETHARGY, INABILITY TO EAT OR DRINK, ETC  QUARANTINE AND ISOLATION: To help decrease the spread of COVID-19 please remain isolated if you have COVID infection or are highly suspected to have COVID infection. This means -stay home and isolate to one room in the home if you live with others. Do not share a bed or bathroom with others while ill, sanitize and wipe down all countertops and keep common areas clean and disinfected. You may discontinue isolation if you have a mild case and are asymptomatic 10 days after symptom onset as long as you have been fever free >24 hours without having to take Motrin or Tylenol. If your case is more severe (meaning you develop pneumonia or are admitted in the hospital), you may have to isolate longer.   If you have been in close contact (within 6 feet) of someone diagnosed with COVID 19, you are advised to quarantine in your home for 14 days as symptoms can develop anywhere from 2-14 days after exposure to the virus. If you develop symptoms, you  must isolate.  Most current guidelines for COVID after exposure -isolate 10 days if you ARE NOT tested for COVID as long as symptoms do not develop -isolate 7 days if you are tested and remain asymptomatic -You do not necessarily need to be tested for COVID if you have + exposure and        develop   symptoms. Just isolate at home x10 days from symptom onset During this global pandemic, CDC advises to  practice social distancing, try to stay at least 35ft away from others at all times. Wear a face covering. Wash and sanitize your hands regularly and avoid going anywhere that is not necessary.  KEEP IN MIND THAT THE COVID TEST IS NOT 100% ACCURATE AND YOU SHOULD STILL DO EVERYTHING TO PREVENT POTENTIAL SPREAD OF VIRUS TO OTHERS (WEAR MASK, WEAR GLOVES, [place] HANDS AND SANITIZE REGULARLY). IF INITIAL TEST IS NEGATIVE, THIS MAY NOT MEAN YOU ARE DEFINITELY NEGATIVE. MOST ACCURATE TESTING IS DONE 5-7 DAYS AFTER EXPOSURE.   It is not advised by CDC to get re-tested after receiving a positive COVID test since you can still test positive for weeks to months after you have already cleared the virus.   *If you have not been vaccinated for COVID, I strongly suggest you consider getting vaccinated as long as there are no contraindications.      ED Prescriptions    Medication Sig Dispense Auth. Provider   predniSONE (DELTASONE) 20 MG tablet Take 2 tablets (40 mg total) by mouth daily for 5 days. 10 tablet Laurene Footman B, PA-C   azithromycin (ZITHROMAX) 250 MG tablet Take 1 tablet (250 mg total) by mouth daily. Take first 2 tablets together, then 1 every day until finished. 6 tablet Gretta Cool     PDMP not reviewed this encounter.   Danton Clap, PA-C 08/28/20 1259

## 2020-08-28 NOTE — ED Triage Notes (Signed)
Patient c/o cough, congestion and SOB that started 5 days ago.  Patient states that she has COPD.  Patient denies fevers.

## 2020-08-28 NOTE — Discharge Instructions (Signed)
COPD EXACERBATION: Continue use of your at home inhalers.  Start prednisone and azithromycin to treat for your exacerbation.  Increase fluid intake and rest.  Take over-the-counter Mucinex to help with the mucus.  I will call with the results of your respiratory panel nasal swab.  If you do test positive for Covid I can refer you to the infusion center for an IV therapy.  Please emergency department if you have any fever, worsening cough, increased chest pain or worsening breathing difficulty.  You have received COVID testing today either for positive exposure, concerning symptoms that could be related to COVID infection, screening purposes, or re-testing after confirmed positive.  Your test obtained today checks for active viral infection in the last 1-2 weeks. If your test is negative now, you can still test positive later. So, if you do develop symptoms you should either get re-tested and/or isolate x 10 days. Please follow CDC guidelines.  While Rapid antigen tests come back in 15-20 minutes, send out PCR/molecular test results typically come back within 24 hours. In the mean time, if you are symptomatic, assume this could be a positive test and treat/monitor yourself as if you do have COVID.   We will call with test results. Please download the MyChart app and set up a profile to access test results.   If symptomatic, go home and rest. Push fluids. Take Tylenol as needed for discomfort. Gargle warm salt water. Throat lozenges. Take Mucinex DM or Robitussin for cough. Humidifier in bedroom to ease coughing. Warm showers. Also review the COVID handout for more information.  COVID-19 INFECTION: The incubation period of COVID-19 is approximately 14 days after exposure, with most symptoms developing in roughly 4-5 days. Symptoms may range in severity from mild to critically severe. Roughly 80% of those infected will have mild symptoms. People of any age may become infected with COVID-19 and have the  ability to transmit the virus. The most common symptoms include: fever, fatigue, cough, body aches, headaches, sore throat, nasal congestion, shortness of breath, nausea, vomiting, diarrhea, changes in smell and/or taste.    COURSE OF ILLNESS Some patients may begin with mild disease which can progress quickly into critical symptoms. If your symptoms are worsening please call ahead to the Emergency Department and proceed there for further treatment. Recovery time appears to be roughly 1-2 weeks for mild symptoms and 3-6 weeks for severe disease.   GO IMMEDIATELY TO ER FOR FEVER YOU ARE UNABLE TO GET DOWN WITH TYLENOL, BREATHING PROBLEMS, CHEST PAIN, FATIGUE, LETHARGY, INABILITY TO EAT OR DRINK, ETC  QUARANTINE AND ISOLATION: To help decrease the spread of COVID-19 please remain isolated if you have COVID infection or are highly suspected to have COVID infection. This means -stay home and isolate to one room in the home if you live with others. Do not share a bed or bathroom with others while ill, sanitize and wipe down all countertops and keep common areas clean and disinfected. You may discontinue isolation if you have a mild case and are asymptomatic 10 days after symptom onset as long as you have been fever free >24 hours without having to take Motrin or Tylenol. If your case is more severe (meaning you develop pneumonia or are admitted in the hospital), you may have to isolate longer.   If you have been in close contact (within 6 feet) of someone diagnosed with COVID 19, you are advised to quarantine in your home for 14 days as symptoms can develop anywhere from 2-14  days after exposure to the virus. If you develop symptoms, you  must isolate.  Most current guidelines for COVID after exposure -isolate 10 days if you ARE NOT tested for COVID as long as symptoms do not develop -isolate 7 days if you are tested and remain asymptomatic -You do not necessarily need to be tested for COVID if you have +  exposure and        develop   symptoms. Just isolate at home x10 days from symptom onset During this global pandemic, CDC advises to practice social distancing, try to stay at least 76ft away from others at all times. Wear a face covering. Wash and sanitize your hands regularly and avoid going anywhere that is not necessary.  KEEP IN MIND THAT THE COVID TEST IS NOT 100% ACCURATE AND YOU SHOULD STILL DO EVERYTHING TO PREVENT POTENTIAL SPREAD OF VIRUS TO OTHERS (WEAR MASK, WEAR GLOVES, [place] HANDS AND SANITIZE REGULARLY). IF INITIAL TEST IS NEGATIVE, THIS MAY NOT MEAN YOU ARE DEFINITELY NEGATIVE. MOST ACCURATE TESTING IS DONE 5-7 DAYS AFTER EXPOSURE.   It is not advised by CDC to get re-tested after receiving a positive COVID test since you can still test positive for weeks to months after you have already cleared the virus.   *If you have not been vaccinated for COVID, I strongly suggest you consider getting vaccinated as long as there are no contraindications.

## 2020-09-21 ENCOUNTER — Encounter: Payer: Self-pay | Admitting: Emergency Medicine

## 2020-09-21 ENCOUNTER — Emergency Department: Payer: No Typology Code available for payment source

## 2020-09-21 ENCOUNTER — Other Ambulatory Visit: Payer: Self-pay

## 2020-09-21 ENCOUNTER — Emergency Department
Admission: EM | Admit: 2020-09-21 | Discharge: 2020-09-21 | Disposition: A | Discharge status: Home / Self Care | Payer: No Typology Code available for payment source | Attending: Emergency Medicine | Admitting: Emergency Medicine

## 2020-09-21 DIAGNOSIS — J441 Chronic obstructive pulmonary disease with (acute) exacerbation: Secondary | ICD-10-CM | POA: Diagnosis not present

## 2020-09-21 DIAGNOSIS — I10 Essential (primary) hypertension: Secondary | ICD-10-CM | POA: Insufficient documentation

## 2020-09-21 DIAGNOSIS — F17210 Nicotine dependence, cigarettes, uncomplicated: Secondary | ICD-10-CM | POA: Diagnosis not present

## 2020-09-21 DIAGNOSIS — Z79899 Other long term (current) drug therapy: Secondary | ICD-10-CM | POA: Insufficient documentation

## 2020-09-21 DIAGNOSIS — R0602 Shortness of breath: Secondary | ICD-10-CM | POA: Diagnosis present

## 2020-09-21 LAB — BASIC METABOLIC PANEL
Anion gap: 10 (ref 5–15)
BUN: 17 mg/dL (ref 6–20)
CO2: 25 mmol/L (ref 22–32)
Calcium: 9.1 mg/dL (ref 8.9–10.3)
Chloride: 107 mmol/L (ref 98–111)
Creatinine, Ser: 0.8 mg/dL (ref 0.44–1.00)
GFR, Estimated: >60 mL/min (ref >60)
Glucose, Bld: 103 mg/dL — ABNORMAL HIGH (ref 70–99)
Potassium: 3.8 mmol/L (ref 3.5–5.1)
Sodium: 142 mmol/L (ref 135–145)

## 2020-09-21 LAB — CBC
HCT: 41.2 % (ref 36.0–46.0)
Hemoglobin: 13 g/dL (ref 12.0–15.0)
MCH: 27.6 pg (ref 26.0–34.0)
MCHC: 31.6 g/dL (ref 30.0–36.0)
MCV: 87.5 fL (ref 80.0–100.0)
Platelets: 231 10*3/uL (ref 150–400)
RBC: 4.71 MIL/uL (ref 3.87–5.11)
RDW: 13 % (ref 11.5–15.5)
WBC: 6.8 10*3/uL (ref 4.0–10.5)
nRBC: 0 % (ref 0.0–0.2)

## 2020-09-21 LAB — BRAIN NATRIURETIC PEPTIDE: B Natriuretic Peptide: 36.3 pg/mL (ref 0.0–100.0)

## 2020-09-21 MED ORDER — PREDNISONE 20 MG PO TABS
60.0000 mg | ORAL_TABLET | Freq: Once | ORAL | Status: AC
  Administered 2020-09-21: 20:00:00 60 mg via ORAL
  Filled 2020-09-21: qty 3

## 2020-09-21 MED ORDER — IPRATROPIUM-ALBUTEROL 20-100 MCG/ACT IN AERS
1.0000 | INHALATION_SPRAY | Freq: Four times a day (QID) | RESPIRATORY_TRACT | 2 refills | Status: DC
Start: 2020-09-21 — End: 2020-10-07

## 2020-09-21 MED ORDER — PREDNISONE 20 MG PO TABS: 20.0000 mg | ORAL_TABLET | Freq: Every day | ORAL | 0 refills | Status: DC

## 2020-09-21 MED ORDER — AZITHROMYCIN 250 MG PO TABS: ORAL_TABLET | ORAL | 0 refills | Status: AC

## 2020-09-21 MED ORDER — IPRATROPIUM-ALBUTEROL 0.5-2.5 (3) MG/3ML IN SOLN
3.0000 mL | Freq: Once | RESPIRATORY_TRACT | Status: AC
  Administered 2020-09-21: 20:00:00 3 mL via RESPIRATORY_TRACT
  Filled 2020-09-21: qty 3

## 2020-09-21 MED ORDER — MAGNESIUM SULFATE 2 GM/50ML IV SOLN
2.0000 g | Freq: Once | INTRAVENOUS | Status: AC
  Administered 2020-09-21: 19:00:00 2 g via INTRAVENOUS
  Filled 2020-09-21: qty 50

## 2020-09-21 MED ORDER — METHYLPREDNISOLONE SODIUM SUCC 125 MG IJ SOLR
125.0000 mg | Freq: Once | INTRAMUSCULAR | Status: AC
  Administered 2020-09-21: 19:00:00 125 mg via INTRAVENOUS
  Filled 2020-09-21: qty 2

## 2020-09-21 MED ORDER — IPRATROPIUM-ALBUTEROL 0.5-2.5 (3) MG/3ML IN SOLN
3.0000 mL | Freq: Once | RESPIRATORY_TRACT | Status: AC
  Administered 2020-09-21: 19:00:00 3 mL via RESPIRATORY_TRACT
  Filled 2020-09-21: qty 3

## 2020-09-21 MED ORDER — IPRATROPIUM-ALBUTEROL 0.5-2.5 (3) MG/3ML IN SOLN
3.0000 mL | Freq: Four times a day (QID) | RESPIRATORY_TRACT | 1 refills | Status: DC | PRN
Start: 2020-09-21 — End: 2020-10-07

## 2020-09-21 NOTE — ED Provider Notes (Signed)
[facility] Emergency Department Provider Note   ____________________________________________   Event Date/Time   First MD Initiated Contact with Patient 09/21/20 Bosie Helper     (approximate)  I have reviewed the triage vital signs and the nursing notes.   HISTORY  Chief Complaint Respiratory Distress    HPI Cindy Gutierrez is a 59 y.o. female who has COPD.  She reports has been getting increasingly short of breath.  She started with a COPD exacerbation after February and got steroids and a Z-Pak.  She is quite short of breath with retractions.  She had a dry cough but had is finished.  She is not having any pleuritic chest pain.  She feels like it is just of severe COPD exacerbation or she has had in a very long time if possibly the worst she has ever had.         Past Medical History:  Diagnosis Date  . COPD (chronic obstructive pulmonary disease) ([place])   . Hypertension     Patient Active Problem List   Diagnosis Date Noted  . Colon cancer screening   . Heart murmur 10/20/2017  . Chronic obstructive pulmonary disease, unspecified ([place]) 06/13/2017  . Essential hypertension 06/13/2017  . Tobacco use 06/13/2017  . Herpes simplex 05/31/2017  . 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 Lipoma 05/30/2017    Past Surgical History:  Procedure Laterality Date  . COLONOSCOPY WITH PROPOFOL N/A 08/21/2018   Procedure: COLONOSCOPY WITH PROPOFOL;  Surgeon: Lin Landsman, MD;  [facility]: [facility] ENDOSCOPY;  Service: Gastroenterology;  Laterality: N/A;  . TUBAL LIGATION      Prior to Admission medications   Medication Sig Start Date End Date Taking? Authorizing Provider  albuterol (PROVENTIL HFA;VENTOLIN HFA) 108 (90 Base) MCG/ACT inhaler Inhale 1-2 puffs into the lungs every 6 (six) hours as needed for wheezing or shortness of breath. 06/26/16   Hagler, Jami L, PA-C  amLODipine (NORVASC) 10 MG tablet Take by mouth. 03/09/17   [provider]  azithromycin (ZITHROMAX Z-PAK) 250 MG tablet Take 2 tablets (500 mg) on  Day 1,  followed by 1 tablet (250 mg) once daily on Days 2 through 5. 09/21/20 09/26/20  Nena Polio, MD  azithromycin (ZITHROMAX) 250 MG tablet Take 1 tablet (250 mg total) by mouth daily. Take first 2 tablets together, then 1 every day until finished. 08/28/20   Danton Clap, PA-C  budesonide-formoterol (SYMBICORT) 80-4.5 MCG/ACT inhaler Inhale 2 puffs into the lungs 2 (two) times daily. 06/26/16 08/28/20  Hagler, Jami L, PA-C  hydrochlorothiazide (HYDRODIURIL) 25 MG tablet Take by mouth. 10/20/17 10/20/18  [provider]  Ipratropium-Albuterol (COMBIVENT) 20-100 MCG/ACT AERS respimat Inhale 1 puff into the lungs every 6 (six) hours. 09/21/20   Nena Polio, MD  ipratropium-albuterol (DUONEB) 0.5-2.5 (3) MG/3ML SOLN Take 3 mLs by nebulization every 6 (six) hours as needed. 06/26/16   Hagler, Jami L, PA-C  ipratropium-albuterol (DUONEB) 0.5-2.5 (3) MG/3ML SOLN Take 3 mLs by nebulization every 6 (six) hours as needed. 09/21/20   Nena Polio, MD  losartan (COZAAR) 100 MG tablet Take by mouth. 05/23/17   [provider]  metoprolol tartrate (LOPRESSOR) 100 MG tablet USE UTD - TK 1 T QPM BEFORE AND TK 1 T 90 MINUTES PRIOR TO CTA CORONARIES 08/07/18   [provider]  predniSONE (DELTASONE) 20 MG tablet Take 1 tablet (20 mg total) by mouth daily. Start on 09/22/20 09/21/20 09/21/21  Nena Polio, MD  Allergies Sulfa antibiotics, Bee pollen, Lisinopril, and Penicillins  History reviewed. No pertinent family history.  Social History Social History   Tobacco Use  . Smoking status: Current Every Day Smoker    Packs/day: 0.50    Types: Cigarettes  . Smokeless tobacco: Never Used  Vaping Use  . Vaping Use: Never used  Substance Use Topics  . Alcohol use: Yes  . Drug use: No    Review of Systems  Constitutional: No fever/chills Eyes: No visual changes. ENT: No sore  throat. Cardiovascular:  chest pain. Respiratory: Denies shortness of breath. Gastrointestinal: No abdominal pain.  No nausea, no vomiting.  No diarrhea.  No constipation. Genitourinary: Negative for dysuria. Musculoskeletal: Negative for back pain. Skin: Negative for rash. Neurological: Negative for headaches, focal weakness  ____________________________________________   PHYSICAL EXAM:  VITAL SIGNS: ED Triage Vitals [09/21/20 1821]  Enc Vitals Group     BP (!) 178/74     Pulse Rate (!) 101     Resp (!) 40     Temp 98.6 F (37 C)     Temp Source Oral     SpO2 93 %     Weight 160 lb (72.6 kg)     Height 5\' 10"  (1.778 m)     Head Circumference      Peak Flow      Pain Score 0     Pain Loc      Pain Edu?      Excl. in [place]?     Constitutional: Alert and oriented in no respiratory distress Eyes: Conjunctivae are normal.  Head: Atraumatic. Nose: No congestion/rhinnorhea. Mouth/Throat: Mucous membranes are moist.  Oropharynx non-erythematous. Neck: No stridor.   Cardiovascular: Normal rate, regular rhythm. Grossly normal heart sounds.  Good peripheral circulation. Respiratory: Increased l respiratory effort.   retractions. Lungs are very tight with wheezing Gastrointestinal: Soft and nontender. No distention. No abdominal bruits.  Musculoskeletal: No lower extremity tenderness nor edema.   Neurologic:  Normal speech and language. No gross focal neurologic deficits are appreciated. No gait instability. Skin:  Skin is warm, dry and intact. No rash noted.   ____________________________________________   LABS (all labs ordered are listed, but only abnormal results are displayed)  Labs Reviewed  BASIC METABOLIC PANEL - Abnormal; Notable for the following components:      Result Value   Glucose, Bld 103 (*)    All other components within normal limits  CBC  BRAIN NATRIURETIC PEPTIDE   ____________________________________________  EKG  EKG read and interpreted by me  shows normal sinus rhythm rate of 92 first-degree AV block normal axis no acute ST-T wave changes ____________________________________________  RADIOLOGY Gertha Calkin, personally viewed and evaluated these images (plain radiographs) as part of my medical decision making, as well as reviewing the written report by the radiologist.  ED MD interpretation:    Official radiology report(s): DG Chest Portable 1 View  Result Date: 09/21/2020 CLINICAL DATA:  Shortness of breath. EXAM: PORTABLE CHEST 1 VIEW COMPARISON:  November 14, 2019 FINDINGS: The lungs are hyperinflated. Mild, diffuse, chronic appearing increased lung markings are seen. There is no evidence of acute infiltrate, pleural effusion or pneumothorax. Radiopaque surgical clips are seen overlying the medial aspect of the right apex. The heart size and mediastinal contours are within normal limits. Chronic sixth, seventh and eighth left rib fractures are seen. IMPRESSION: Chronic appearing increased lung markings without evidence of acute or active cardiopulmonary disease. Electronically Signed   By: Hoover Browns  [place] M.D.   On: 09/21/2020 19:08    ____________________________________________   PROCEDURES  Procedure(s) performed (including Critical Care):  Procedures   ____________________________________________   INITIAL IMPRESSION / ASSESSMENT AND PLAN / ED COURSE ----------------------------------------- 7:56 PM on 09/21/2020 -----------------------------------------  Patient now feels much better and wants to go home. Her lungs are clear. I will let her go home. Her labs are essentially okay. This does seem to have been a COPD exacerbation. I will let her have some prednisone and Zithromax and DuoNeb. There is no sign of pneumonia. There is no sign of Covid. No symptoms consistent with PE. Patient does not have signs or symptoms of CHF either.                ____________________________________________   FINAL CLINICAL IMPRESSION(S) / ED DIAGNOSES  Final diagnoses:  COPD exacerbation [facility])     ED Discharge Orders         Ordered    predniSONE (DELTASONE) 20 MG tablet  Daily        09/21/20 1951    azithromycin (ZITHROMAX Z-PAK) 250 MG tablet        09/21/20 1951    Ipratropium-Albuterol (COMBIVENT) 20-100 MCG/ACT AERS respimat  Every 6 hours        09/21/20 1954    ipratropium-albuterol (DUONEB) 0.5-2.5 (3) MG/3ML SOLN  Every 6 hours PRN        09/21/20 1954          *Please note:  EVANGELENE VORA was evaluated in Emergency Department on 09/21/2020 for the symptoms described in the history of present illness. She was evaluated in the context of the global COVID-19 pandemic, which necessitated consideration that the patient might be at risk for infection with the SARS-CoV-2 virus that causes COVID-19. Institutional protocols and algorithms that pertain to the evaluation of patients at risk for COVID-19 are in a state of rapid change based on information released by regulatory bodies including the CDC and federal and state organizations. These policies and algorithms were followed during the patient's care in the ED.  Some ED evaluations and interventions may be delayed as a result of limited staffing during and the pandemic.*   Note:  This document was prepared using Dragon voice recognition software and may include unintentional dictation errors.    Nena Polio, MD 09/21/20 [phone]

## 2020-09-21 NOTE — ED Notes (Signed)
Pt ambulatory to restroom. Pt returned to bed with dyspnea following walking to bathroom. Pt oxygen saturation at 90 percent on room air with respirations of 28 per minute. Pt placed on 2L .

## 2020-09-21 NOTE — Discharge Instructions (Addendum)
Please return if you get worse especially if you have a fever or chest pain.Take the prednisone as directed. Start tomorrow. Take the zithromax as directed as well. Use either the duoneb in your nebulizer or the inhaler 4 x a day. Please follow up with your regular doctor with in a week or so.

## 2020-09-21 NOTE — ED Triage Notes (Signed)
PT to ER with c/o increasing SHOB in last hour.  PT states she started with exacerbation the week after Thanksgiving and was seen at [facility] and given steroids and zpack.  Pt with audible wheezes in triage.  Pt talking in short choppy sentences.

## 2020-09-22 ENCOUNTER — Telehealth: Payer: Self-pay

## 2020-09-22 NOTE — Telephone Encounter (Signed)
Transition Care Management Follow-up Telephone Call  Date of discharge and from where: 09/21/2020 from [facility]  How have you been since you were released from the hospital? Patient states that she is feeling better.   Any questions or concerns? No  Items Reviewed:  Did the pt receive and understand the discharge instructions provided? Yes   Medications obtained and verified? No   Other? No   Any new allergies since your discharge? No   Dietary orders reviewed? Yes  Do you have support at home? Yes   Functional Questionnaire: (I = Independent and D = Dependent) ADLs: I  Bathing/Dressing- I  Meal Prep- I  Eating- I  Maintaining continence- I  Transferring/Ambulation- I  Managing Meds- I  Follow up appointments reviewed:   PCP Hospital f/u appt confirmed? No    Are transportation arrangements needed? No   If their condition worsens, is the pt aware to call PCP or go to the Emergency Dept.? Yes  Was the patient provided with contact information for the PCP's office or ED? Yes  Was to pt encouraged to call back with questions or concerns? Yes

## 2020-10-03 ENCOUNTER — Inpatient Hospital Stay: Payer: No Typology Code available for payment source

## 2020-10-03 ENCOUNTER — Encounter: Payer: Self-pay | Admitting: Emergency Medicine

## 2020-10-03 ENCOUNTER — Inpatient Hospital Stay
Admission: EM | Admit: 2020-10-03 | Discharge: 2020-10-07 | DRG: 190 | Disposition: A | Discharge status: Home / Self Care | Payer: No Typology Code available for payment source | Attending: Hospitalist | Admitting: Hospitalist

## 2020-10-03 ENCOUNTER — Emergency Department: Payer: No Typology Code available for payment source

## 2020-10-03 ENCOUNTER — Other Ambulatory Visit: Payer: Self-pay

## 2020-10-03 DIAGNOSIS — Z8249 Family history of ischemic heart disease and other diseases of the circulatory system: Secondary | ICD-10-CM | POA: Diagnosis not present

## 2020-10-03 DIAGNOSIS — Z888 Allergy status to other drugs, medicaments and biological substances status: Secondary | ICD-10-CM | POA: Diagnosis not present

## 2020-10-03 DIAGNOSIS — Z79899 Other long term (current) drug therapy: Secondary | ICD-10-CM | POA: Diagnosis not present

## 2020-10-03 DIAGNOSIS — Z833 Family history of diabetes mellitus: Secondary | ICD-10-CM | POA: Diagnosis not present

## 2020-10-03 DIAGNOSIS — Z823 Family history of stroke: Secondary | ICD-10-CM

## 2020-10-03 DIAGNOSIS — K5900 Constipation, unspecified: Secondary | ICD-10-CM | POA: Diagnosis present

## 2020-10-03 DIAGNOSIS — R911 Solitary pulmonary nodule: Secondary | ICD-10-CM | POA: Diagnosis present

## 2020-10-03 DIAGNOSIS — Z880 Allergy status to penicillin: Secondary | ICD-10-CM

## 2020-10-03 DIAGNOSIS — Z91030 Bee allergy status: Secondary | ICD-10-CM | POA: Diagnosis not present

## 2020-10-03 DIAGNOSIS — I10 Essential (primary) hypertension: Secondary | ICD-10-CM | POA: Diagnosis present

## 2020-10-03 DIAGNOSIS — J209 Acute bronchitis, unspecified: Secondary | ICD-10-CM | POA: Diagnosis present

## 2020-10-03 DIAGNOSIS — J440 Chronic obstructive pulmonary disease with acute lower respiratory infection: Secondary | ICD-10-CM | POA: Diagnosis present

## 2020-10-03 DIAGNOSIS — I160 Hypertensive urgency: Secondary | ICD-10-CM | POA: Diagnosis not present

## 2020-10-03 DIAGNOSIS — E042 Nontoxic multinodular goiter: Secondary | ICD-10-CM | POA: Diagnosis present

## 2020-10-03 DIAGNOSIS — F17210 Nicotine dependence, cigarettes, uncomplicated: Secondary | ICD-10-CM | POA: Diagnosis present

## 2020-10-03 DIAGNOSIS — J441 Chronic obstructive pulmonary disease with (acute) exacerbation: Principal | ICD-10-CM | POA: Diagnosis present

## 2020-10-03 DIAGNOSIS — E041 Nontoxic single thyroid nodule: Secondary | ICD-10-CM

## 2020-10-03 DIAGNOSIS — I248 Other forms of acute ischemic heart disease: Secondary | ICD-10-CM | POA: Diagnosis present

## 2020-10-03 DIAGNOSIS — Z7951 Long term (current) use of inhaled steroids: Secondary | ICD-10-CM

## 2020-10-03 DIAGNOSIS — R778 Other specified abnormalities of plasma proteins: Secondary | ICD-10-CM | POA: Diagnosis not present

## 2020-10-03 DIAGNOSIS — I272 Pulmonary hypertension, unspecified: Secondary | ICD-10-CM

## 2020-10-03 DIAGNOSIS — Z882 Allergy status to sulfonamides status: Secondary | ICD-10-CM

## 2020-10-03 DIAGNOSIS — J9601 Acute respiratory failure with hypoxia: Secondary | ICD-10-CM | POA: Diagnosis not present

## 2020-10-03 DIAGNOSIS — Z20822 Contact with and (suspected) exposure to covid-19: Secondary | ICD-10-CM | POA: Diagnosis present

## 2020-10-03 HISTORY — DX: Pulmonary hypertension, unspecified: I27.20

## 2020-10-03 LAB — BLOOD GAS, VENOUS
Acid-Base Excess: 1.8 mmol/L (ref 0.0–2.0)
Bicarbonate: 28.2 mmol/L — ABNORMAL HIGH (ref 20.0–28.0)
O2 Saturation: 79.2 %
Patient temperature: 37
pCO2, Ven: 51 mmHg (ref 44.0–60.0)
pH, Ven: 7.35 (ref 7.250–7.430)
pO2, Ven: 46 mmHg — ABNORMAL HIGH (ref 32.0–45.0)

## 2020-10-03 LAB — CBC WITH DIFFERENTIAL/PLATELET
Abs Immature Granulocytes: 0.06 10*3/uL (ref 0.00–0.07)
Basophils Absolute: 0.1 10*3/uL (ref 0.0–0.1)
Basophils Relative: 1 %
Eosinophils Absolute: 0.4 10*3/uL (ref 0.0–0.5)
Eosinophils Relative: 4 %
HCT: 38.9 % (ref 36.0–46.0)
Hemoglobin: 12.3 g/dL (ref 12.0–15.0)
Immature Granulocytes: 1 %
Lymphocytes Relative: 18 %
Lymphs Abs: 1.8 10*3/uL (ref 0.7–4.0)
MCH: 27.6 pg (ref 26.0–34.0)
MCHC: 31.6 g/dL (ref 30.0–36.0)
MCV: 87.4 fL (ref 80.0–100.0)
Monocytes Absolute: 0.9 10*3/uL (ref 0.1–1.0)
Monocytes Relative: 9 %
Neutro Abs: 6.8 10*3/uL (ref 1.7–7.7)
Neutrophils Relative %: 67 %
Platelets: 262 10*3/uL (ref 150–400)
RBC: 4.45 MIL/uL (ref 3.87–5.11)
RDW: 13.2 % (ref 11.5–15.5)
WBC: 10.1 10*3/uL (ref 4.0–10.5)
nRBC: 0 % (ref 0.0–0.2)

## 2020-10-03 LAB — RESP PANEL BY RT-PCR (FLU A&B, COVID) ARPGX2
Influenza A by PCR: NEGATIVE
Influenza B by PCR: NEGATIVE
SARS Coronavirus 2 by RT PCR: NEGATIVE

## 2020-10-03 LAB — COMPREHENSIVE METABOLIC PANEL
ALT: 14 U/L (ref 0–44)
AST: 23 U/L (ref 15–41)
Albumin: 3.4 g/dL — ABNORMAL LOW (ref 3.5–5.0)
Alkaline Phosphatase: 86 U/L (ref 38–126)
Anion gap: 11 (ref 5–15)
BUN: 13 mg/dL (ref 6–20)
CO2: 25 mmol/L (ref 22–32)
Calcium: 8.7 mg/dL — ABNORMAL LOW (ref 8.9–10.3)
Chloride: 107 mmol/L (ref 98–111)
Creatinine, Ser: 0.78 mg/dL (ref 0.44–1.00)
GFR, Estimated: >60 mL/min (ref >60)
Glucose, Bld: 94 mg/dL (ref 70–99)
Potassium: 3.8 mmol/L (ref 3.5–5.1)
Sodium: 143 mmol/L (ref 135–145)
Total Bilirubin: 0.6 mg/dL (ref 0.3–1.2)
Total Protein: 6.4 g/dL — ABNORMAL LOW (ref 6.5–8.1)

## 2020-10-03 LAB — TROPONIN I (HIGH SENSITIVITY)
Troponin I (High Sensitivity): 40 ng/L — ABNORMAL HIGH (ref <18)
Troponin I (High Sensitivity): 103 ng/L (ref <18)

## 2020-10-03 LAB — BRAIN NATRIURETIC PEPTIDE: B Natriuretic Peptide: 51.6 pg/mL (ref 0.0–100.0)

## 2020-10-03 LAB — FIBRIN DERIVATIVES D-DIMER (ARMC ONLY): Fibrin derivatives D-dimer (ARMC): 331.37 ng/mL (FEU) (ref 0.00–499.00)

## 2020-10-03 LAB — PROCALCITONIN: Procalcitonin: <0.1 ng/mL

## 2020-10-03 MED ORDER — LACTATED RINGERS IV BOLUS
500.0000 mL | Freq: Once | INTRAVENOUS | Status: AC
  Administered 2020-10-03: 500 mL via INTRAVENOUS

## 2020-10-03 MED ORDER — ONDANSETRON HCL 4 MG/2ML IJ SOLN: 4.0000 mg | Freq: Four times a day (QID) | INTRAMUSCULAR | Status: DC | PRN

## 2020-10-03 MED ORDER — TRAZODONE HCL 50 MG PO TABS: 25.0000 mg | ORAL_TABLET | Freq: Every evening | ORAL | Status: DC | PRN

## 2020-10-03 MED ORDER — METHYLPREDNISOLONE SODIUM SUCC 125 MG IJ SOLR
125.0000 mg | Freq: Once | INTRAMUSCULAR | Status: AC
  Administered 2020-10-03: 125 mg via INTRAVENOUS
  Filled 2020-10-03: qty 2

## 2020-10-03 MED ORDER — AMLODIPINE BESYLATE 10 MG PO TABS
10.0000 mg | ORAL_TABLET | Freq: Every day | ORAL | Status: DC
  Administered 2020-10-03 – 2020-10-07 (×5): 10 mg via ORAL
  Filled 2020-10-03 (×2): qty 1
  Filled 2020-10-03 (×3): qty 2
  Filled 2020-10-03: qty 1

## 2020-10-03 MED ORDER — METOPROLOL TARTRATE 50 MG PO TABS
50.0000 mg | ORAL_TABLET | Freq: Two times a day (BID) | ORAL | Status: DC
Start: 2020-10-03 — End: 2020-10-03
  Administered 2020-10-03: 50 mg via ORAL
  Filled 2020-10-03: qty 1

## 2020-10-03 MED ORDER — ACETAMINOPHEN 650 MG RE SUPP: 650.0000 mg | Freq: Four times a day (QID) | RECTAL | Status: DC | PRN

## 2020-10-03 MED ORDER — IPRATROPIUM-ALBUTEROL 0.5-2.5 (3) MG/3ML IN SOLN
3.0000 mL | Freq: Once | RESPIRATORY_TRACT | Status: AC
  Administered 2020-10-03: 3 mL via RESPIRATORY_TRACT
  Filled 2020-10-03: qty 3

## 2020-10-03 MED ORDER — IPRATROPIUM-ALBUTEROL 0.5-2.5 (3) MG/3ML IN SOLN
3.0000 mL | Freq: Once | RESPIRATORY_TRACT | Status: AC
  Administered 2020-10-03: 3 mL via RESPIRATORY_TRACT

## 2020-10-03 MED ORDER — ONDANSETRON HCL 4 MG PO TABS: 4.0000 mg | ORAL_TABLET | Freq: Four times a day (QID) | ORAL | Status: DC | PRN

## 2020-10-03 MED ORDER — PREDNISONE 20 MG PO TABS: 40.0000 mg | ORAL_TABLET | Freq: Every day | ORAL | Status: DC

## 2020-10-03 MED ORDER — MAGNESIUM HYDROXIDE 400 MG/5ML PO SUSP
30.0000 mL | Freq: Every day | ORAL | Status: DC | PRN
  Administered 2020-10-04: 30 mL via ORAL
  Filled 2020-10-03: qty 30

## 2020-10-03 MED ORDER — HYDROCHLOROTHIAZIDE 25 MG PO TABS: 25.0000 mg | ORAL_TABLET | Freq: Every day | ORAL | Status: DC

## 2020-10-03 MED ORDER — METHYLPREDNISOLONE SODIUM SUCC 40 MG IJ SOLR
40.0000 mg | Freq: Four times a day (QID) | INTRAMUSCULAR | Status: DC
  Filled 2020-10-03: qty 1

## 2020-10-03 MED ORDER — IOHEXOL 350 MG/ML SOLN
100.0000 mL | Freq: Once | INTRAVENOUS | Status: AC | PRN
  Administered 2020-10-03: 100 mL via INTRAVENOUS

## 2020-10-03 MED ORDER — LABETALOL HCL 5 MG/ML IV SOLN
20.0000 mg | INTRAVENOUS | Status: DC | PRN
  Administered 2020-10-04: 20 mg via INTRAVENOUS
  Filled 2020-10-03: qty 4

## 2020-10-03 MED ORDER — LOSARTAN POTASSIUM 50 MG PO TABS: 100.0000 mg | ORAL_TABLET | Freq: Every day | ORAL | Status: DC

## 2020-10-03 MED ORDER — ASPIRIN 81 MG PO CHEW
324.0000 mg | CHEWABLE_TABLET | Freq: Once | ORAL | Status: AC
  Administered 2020-10-03: 324 mg via ORAL
  Filled 2020-10-03: qty 4

## 2020-10-03 MED ORDER — SODIUM CHLORIDE 0.9 % IV SOLN: INTRAVENOUS | Status: DC

## 2020-10-03 MED ORDER — IPRATROPIUM-ALBUTEROL 0.5-2.5 (3) MG/3ML IN SOLN
3.0000 mL | Freq: Four times a day (QID) | RESPIRATORY_TRACT | Status: DC
  Administered 2020-10-03 – 2020-10-06 (×9): 3 mL via RESPIRATORY_TRACT
  Filled 2020-10-03 (×9): qty 3

## 2020-10-03 MED ORDER — ACETAMINOPHEN 325 MG PO TABS: 650.0000 mg | ORAL_TABLET | Freq: Four times a day (QID) | ORAL | Status: DC | PRN

## 2020-10-03 MED ORDER — SODIUM CHLORIDE 0.9 % IV SOLN
1.0000 g | INTRAVENOUS | Status: DC
  Administered 2020-10-03 – 2020-10-06 (×4): 1 g via INTRAVENOUS
  Filled 2020-10-03: qty 1
  Filled 2020-10-03: qty 10
  Filled 2020-10-03: qty 1
  Filled 2020-10-03 (×2): qty 10

## 2020-10-03 MED ORDER — ENOXAPARIN SODIUM 40 MG/0.4ML ~~LOC~~ SOLN
40.0000 mg | SUBCUTANEOUS | Status: DC
  Administered 2020-10-03 – 2020-10-06 (×4): 40 mg via SUBCUTANEOUS
  Filled 2020-10-03 (×4): qty 0.4

## 2020-10-03 MED ORDER — METHYLPREDNISOLONE SODIUM SUCC 40 MG IJ SOLR
40.0000 mg | Freq: Four times a day (QID) | INTRAMUSCULAR | Status: AC
  Administered 2020-10-04 (×4): 40 mg via INTRAVENOUS
  Filled 2020-10-03 (×4): qty 1

## 2020-10-03 NOTE — ED Notes (Signed)
Patient reports feeling much better.  Warm blankets applied.  Will continue to monitor

## 2020-10-03 NOTE — ED Provider Notes (Signed)
[facility] Emergency Department Provider Note  ____________________________________________   Event Date/Time   First MD Initiated Contact with Patient 10/03/20 1845     (approximate)  I have reviewed the triage vital signs and the nursing notes.   HISTORY  Chief Complaint Respiratory Distress   HPI Cindy Gutierrez is a 59 y.o. female has medical history of COPD, hypertension, and tobacco abuse who presents EMS from home after developing acute onset of shortness of breath and cough yesterday.  Patient denies any chest pain, vomiting, diarrhea, dysuria, abdominal pain, back pain, headache, earache, sore throat, fevers or other acute sick symptoms.  He states this feels like prior COPD exacerbations he has had in the past.  She denies any illegal drug use or daily EtOH use.  She is extremely short of breath and tachypnea on arrival.  History is limited secondary to respiratory distress.         Past Medical History:  Diagnosis Date  . COPD (chronic obstructive pulmonary disease) ([facility])   . Hypertension     Patient Active Problem List   Diagnosis Date Noted  . Colon cancer screening   . Heart murmur 10/20/2017  . Chronic obstructive pulmonary disease, unspecified ([facility]) 06/13/2017  . Essential hypertension 06/13/2017  . Tobacco use 06/13/2017  . Herpes simplex 05/31/2017  . 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 Lipoma 05/30/2017    Past Surgical History:  Procedure Laterality Date  . COLONOSCOPY WITH PROPOFOL N/A 08/21/2018   Procedure: COLONOSCOPY WITH PROPOFOL;  Surgeon: Toney Reil, MD;  Location: [facility] ENDOSCOPY;  Service: Gastroenterology;  Laterality: N/A;  . TUBAL LIGATION      Prior to Admission medications   Medication Sig Start Date End Date Taking? Authorizing Provider  albuterol (PROVENTIL HFA;VENTOLIN HFA) 108 (90 Base) MCG/ACT inhaler Inhale 1-2 puffs into the lungs every 6 (six) hours as needed for wheezing or  shortness of breath. 06/26/16   Hagler, Jami L, PA-C  amLODipine (NORVASC) 10 MG tablet Take by mouth. 03/09/17   [provider]  azithromycin (ZITHROMAX) 250 MG tablet Take 1 tablet (250 mg total) by mouth daily. Take first 2 tablets together, then 1 every day until finished. 08/28/20   Shirlee Latch, PA-C  budesonide-formoterol (SYMBICORT) 80-4.5 MCG/ACT inhaler Inhale 2 puffs into the lungs 2 (two) times daily. 06/26/16 08/28/20  Hagler, Jami L, PA-C  hydrochlorothiazide (HYDRODIURIL) 25 MG tablet Take by mouth. 10/20/17 10/20/18  [provider]  Ipratropium-Albuterol (COMBIVENT) 20-100 MCG/ACT AERS respimat Inhale 1 puff into the lungs every 6 (six) hours. 09/21/20   Arnaldo Natal, MD  ipratropium-albuterol (DUONEB) 0.5-2.5 (3) MG/3ML SOLN Take 3 mLs by nebulization every 6 (six) hours as needed. 06/26/16   Hagler, Jami L, PA-C  ipratropium-albuterol (DUONEB) 0.5-2.5 (3) MG/3ML SOLN Take 3 mLs by nebulization every 6 (six) hours as needed. 09/21/20   Arnaldo Natal, MD  losartan (COZAAR) 100 MG tablet Take by mouth. 05/23/17   [provider]  metoprolol tartrate (LOPRESSOR) 100 MG tablet USE UTD - TK 1 T QPM BEFORE AND TK 1 T 90 MINUTES PRIOR TO CTA CORONARIES 08/07/18   [provider]  predniSONE (DELTASONE) 20 MG tablet Take 1 tablet (20 mg total) by mouth daily. Start on 09/22/20 09/21/20 09/21/21  Arnaldo Natal, MD    Allergies Sulfa antibiotics, Bee pollen, Lisinopril, and Penicillins  History reviewed. No pertinent family history.  Social History Social History   Tobacco Use  . Smoking  status: Current Every Day Smoker    Packs/day: 0.50    Types: Cigarettes  . Smokeless tobacco: Never Used  Vaping Use  . Vaping Use: Never used  Substance Use Topics  . Alcohol use: Yes  . Drug use: No    Review of Systems  Review of Systems  Unable to perform ROS: Severe respiratory distress  Constitutional: Positive for malaise/fatigue.   Respiratory: Positive for cough, shortness of breath and wheezing.   Cardiovascular: Negative for chest pain.  Gastrointestinal: Negative for abdominal pain, heartburn, nausea and vomiting.  Genitourinary: Negative for dysuria.  Musculoskeletal: Negative for myalgias.  Skin: Negative for rash.  Neurological: Negative for dizziness, seizures, weakness and headaches.      ____________________________________________   PHYSICAL EXAM:  VITAL SIGNS: ED Triage Vitals [10/03/20 1843]  Enc Vitals Group     BP      Pulse Rate (!) 114     Resp (!) 40     Temp      Temp src      SpO2 91 %     Weight      Height      Head Circumference      Peak Flow      Pain Score      Pain Loc      Pain Edu?      Excl. in [place]?    Vitals:   10/03/20 1849 10/03/20 1850  BP:    Pulse:  (!) 111  Resp:  18  Temp:  98.8 F (37.1 C)  SpO2: 91% 90%   Physical Exam Vitals and nursing note reviewed.  Constitutional:      General: She is in acute distress.     Appearance: She is well-developed and well-nourished. She is ill-appearing.  HENT:     Head: Normocephalic and atraumatic.     Right Ear: External ear normal.     Left Ear: External ear normal.  Eyes:     Conjunctiva/sclera: Conjunctivae normal.  Cardiovascular:     Rate and Rhythm: Regular rhythm. Tachycardia present.     Heart sounds: No murmur heard.   Pulmonary:     Effort: Tachypnea, accessory muscle usage and respiratory distress present.     Breath sounds: Decreased air movement present. Examination of the right-upper field reveals wheezing. Examination of the left-upper field reveals wheezing. Examination of the right-middle field reveals wheezing. Examination of the left-middle field reveals wheezing. Examination of the right-lower field reveals wheezing. Examination of the left-lower field reveals wheezing. Wheezing present.  Abdominal:     Palpations: Abdomen is soft.     Tenderness: There is no abdominal tenderness.   Musculoskeletal:        General: No edema.     Cervical back: Neck supple. No rigidity.  Skin:    General: Skin is warm and dry.  Neurological:     Mental Status: She is alert.  Psychiatric:        Mood and Affect: Mood and affect normal.      ____________________________________________   LABS (all labs ordered are listed, but only abnormal results are displayed)  Labs Reviewed  COMPREHENSIVE METABOLIC PANEL - Abnormal; Notable for the following components:      Result Value   Calcium 8.7 (*)    Total Protein 6.4 (*)    Albumin 3.4 (*)    All other components within normal limits  TROPONIN I (HIGH SENSITIVITY) - Abnormal; Notable for the following components:   Troponin I (High  Sensitivity) 40 (*)    All other components within normal limits  RESP PANEL BY RT-PCR (FLU A&B, COVID) ARPGX2  CBC WITH DIFFERENTIAL/PLATELET  BRAIN NATRIURETIC PEPTIDE  FIBRIN DERIVATIVES D-DIMER ([facility] ONLY)  PROCALCITONIN  BLOOD GAS, VENOUS  POC SARS CORONAVIRUS 2 AG -  ED  TROPONIN I (HIGH SENSITIVITY)   ____________________________________________  EKG  Sinus tachycardia with ventricular to 102, normal axis, unremarkable intervals, nonspecific changes in the inferior leads and no other clear evidence of acute ischemia. ____________________________________________  RADIOLOGY  ED MD interpretation: No focal consolidation, pneumothorax, overt edema, large effusion or other clear acute thoracic process.   Official radiology report(s): DG Chest 1 View  Result Date: 10/03/2020 CLINICAL DATA:  Shortness of breath EXAM: CHEST  1 VIEW COMPARISON:  None. FINDINGS: The heart size and mediastinal contours are within normal limits. Both lungs are clear. The visualized skeletal structures are unremarkable. IMPRESSION: No active disease. Electronically Signed   By: Prudencio Pair M.D.   On: 10/03/2020 19:02    ____________________________________________   PROCEDURES  Procedure(s) performed  (including Critical Care):  .Critical Care Performed by: Lucrezia Starch, MD Authorized by: Lucrezia Starch, MD   Critical care provider statement:    Critical care time (minutes):  45   Critical care time was exclusive of:  Separately billable procedures and treating other patients   Critical care was necessary to treat or prevent imminent or life-threatening deterioration of the following conditions:  Respiratory failure   Critical care was time spent personally by me on the following activities:  Discussions with consultants, evaluation of patient's response to treatment, examination of patient, ordering and performing treatments and interventions, ordering and review of laboratory studies, ordering and review of radiographic studies, pulse oximetry, re-evaluation of patient's condition, obtaining history from patient or surrogate and review of old charts     ____________________________________________   INITIAL IMPRESSION / [place] / ED COURSE      Patient presents above to history exam for assessment of worsening cough and shortness of breath over the last 2 days in the setting of tobacco abuse and COPD.  On arrival patient is extremely tachypneic with diffuse wheezing and rhonchi.  She is noted to have an SPO2 of 89% on room air.  She is otherwise hypertensive with BP of 79 and has a heart rate of 114.  Patient was immediately placed on BiPAP and started on duo nebs given steroids for concern for COPD exacerbation.  No evidence of acute volume overload on chest x-ray or pneumothorax or pneumonia in addition patient is not febrile.  While ECG has nonspecific findings troponin is slightly elevated at 40 I suspect this is secondary to some mild demand ischemia and low suspicion for acute coronary thrombosis bilateral plan to trend troponin after an emergency room.  Low suspicion for acute infectious process given absence of focal consolidation on chest x-ray, leukocytosis,  or fever.  CMP shows no significant ultralight or metabolic derangements.  BNP is consistent with chest x-ray and exam do not show evidence of significant volume overload.  Low suspicion for PE as D-dimer is less than 500.  Will plan to admit to medicine service for further evaluation management. ____________________________________________   FINAL CLINICAL IMPRESSION(S) / ED DIAGNOSES  Final diagnoses:  COPD exacerbation ([place])  Acute respiratory failure with hypoxia ([facility])  Troponin I above reference range    Medications  lactated ringers bolus 500 mL (has no administration in time range)  aspirin chewable tablet 324  mg (has no administration in time range)  ipratropium-albuterol (DUONEB) 0.5-2.5 (3) MG/3ML nebulizer solution 3 mL (3 mLs Nebulization Given 10/03/20 1900)  ipratropium-albuterol (DUONEB) 0.5-2.5 (3) MG/3ML nebulizer solution 3 mL (3 mLs Nebulization Given 10/03/20 1856)  ipratropium-albuterol (DUONEB) 0.5-2.5 (3) MG/3ML nebulizer solution 3 mL (3 mLs Nebulization Given 10/03/20 1852)  methylPREDNISolone sodium succinate (SOLU-MEDROL) 125 mg/2 mL injection 125 mg (125 mg Intravenous Given 10/03/20 2033)     ED Discharge Orders    None       Note:  This document was prepared using Dragon voice recognition software and may include unintentional dictation errors.   Lucrezia Starch, MD 10/03/20 2035

## 2020-10-03 NOTE — ED Triage Notes (Signed)
Pt to ED via POV with c/o respiratory distress. Pt states hx of COPD, recently seen for same. Pt with severe dyspnea on arrival, RA sats 91%.

## 2020-10-03 NOTE — H&P (Addendum)
[place]   PATIENT NAME: Cindy Gutierrez    MR#:  [id number]  DATE OF BIRTH:  1961/04/01  DATE OF ADMISSION:  10/03/2020  PRIMARY CARE PHYSICIAN: [facility]   REQUESTING/REFERRING PHYSICIAN: Hulan Saas, MD     CHIEF COMPLAINT:   Chief Complaint  Patient presents with  . Respiratory Distress    HISTORY OF PRESENT ILLNESS:  Cindy Gutierrez  is a 60 y.o. African-American female with a known history of COPD and hypertension, presented to the emergency room with the onset of worsening dyspnea with associated congested cough with inability to expectorate as well as wheezing over the last couple of days.  She stated that she had similar symptoms intermittently over the last 3 weeks and she finished 2 courses of prednisone and 2 courses of Z-Pak.  She has been vaccinated against COVID-19 her last vaccine was in July.  She is planning to get a booster this month.  No fever or chills.  No nausea or vomiting or abdominal pain.  No chest pain or palpitations.  Upon presentation to the emergency room, heart rate was 114 respiratory rate was 40 with a blood pressure 179/79 and temperature 98.8 with a pulse oximetry of 89% on room air initially.  She was in significant respiratory distress initially and therefore was placed on BiPAP which was later on tapered to nasal cannula and she was satting 95% on 2 L of O2 by nasal cannula during my interview.  Chest x-ray showed no acute cardiopulmonary disease.  Labs revealed unremarkable CBC and CMP.  High-sensitivity troponin I was 40 and later 103 and BNP was 51.6 with procalcitonin less than 0.1. EKG showed sinus tachycardia with rate of 102 with Q waves in V1 and V2 and left axis deviation.  The patient was given 4 of aspirin, 3 duo nebs, 500 mL IV lactated Ringer 125 mg of IV Solu-Medrol.  She will be admitted to a progressive unit bed for further evaluation and management. PAST MEDICAL HISTORY:   Past Medical History:  Diagnosis Date   . COPD (chronic obstructive pulmonary disease) ([place])   . Hypertension     PAST SURGICAL HISTORY:   Past Surgical History:  Procedure Laterality Date  . COLONOSCOPY WITH PROPOFOL N/A 08/21/2018   Procedure: COLONOSCOPY WITH PROPOFOL;  Surgeon: Lin Landsman, MD;  Location: [facility] ENDOSCOPY;  Service: Gastroenterology;  Laterality: N/A;  . TUBAL LIGATION      SOCIAL HISTORY:   Social History   Tobacco Use  . Smoking status: Current Every Day Smoker    Packs/day: 0.50    Types: Cigarettes  . Smokeless tobacco: Never Used  Substance Use Topics  . Alcohol use: Yes    FAMILY HISTORY:  Positive for hypertension, diabetes mellitus, coronary artery disease, cancer and CVA.  DRUG ALLERGIES:   Allergies  Allergen Reactions  . Sulfa Antibiotics Hives and Rash  . Bee Pollen Hives  . Lisinopril Nausea Only  . Penicillins Hives    REVIEW OF SYSTEMS:   ROS As per history of present illness. All pertinent systems were reviewed above. Constitutional, HEENT, cardiovascular, respiratory, GI, GU, musculoskeletal, neuro, psychiatric, endocrine, integumentary and hematologic systems were reviewed and are otherwise negative/unremarkable except for positive findings mentioned above in the HPI.   MEDICATIONS AT HOME:   Prior to Admission medications   Medication Sig Start Date End Date Taking? Authorizing Provider  albuterol (PROVENTIL HFA;VENTOLIN HFA) 108 (90 Base) MCG/ACT inhaler Inhale 1-2 puffs into the lungs every 6 (  six) hours as needed for wheezing or shortness of breath. 06/26/16   Hagler, Jami L, PA-C  amLODipine (NORVASC) 10 MG tablet Take by mouth. 03/09/17   [provider]  azithromycin (ZITHROMAX) 250 MG tablet Take 1 tablet (250 mg total) by mouth daily. Take first 2 tablets together, then 1 every day until finished. 08/28/20   Shirlee Latch, PA-C  budesonide-formoterol (SYMBICORT) 80-4.5 MCG/ACT inhaler Inhale 2 puffs into the lungs 2 (two) times daily.  06/26/16 08/28/20  Hagler, Jami L, PA-C  hydrochlorothiazide (HYDRODIURIL) 25 MG tablet Take by mouth. 10/20/17 10/20/18  [provider]  Ipratropium-Albuterol (COMBIVENT) 20-100 MCG/ACT AERS respimat Inhale 1 puff into the lungs every 6 (six) hours. 09/21/20   Arnaldo Natal, MD  ipratropium-albuterol (DUONEB) 0.5-2.5 (3) MG/3ML SOLN Take 3 mLs by nebulization every 6 (six) hours as needed. 06/26/16   Hagler, Jami L, PA-C  ipratropium-albuterol (DUONEB) 0.5-2.5 (3) MG/3ML SOLN Take 3 mLs by nebulization every 6 (six) hours as needed. 09/21/20   Arnaldo Natal, MD  losartan (COZAAR) 100 MG tablet Take by mouth. 05/23/17   [provider]  metoprolol tartrate (LOPRESSOR) 100 MG tablet USE UTD - TK 1 T QPM BEFORE AND TK 1 T 90 MINUTES PRIOR TO CTA CORONARIES 08/07/18   [provider]      VITAL SIGNS:  Blood pressure (!) 179/79, pulse (!) 111, temperature 98.8 F (37.1 C), temperature source Oral, resp. rate 18, height 5\' 10"  (1.778 m), weight 72.6 kg, SpO2 90 %.  PHYSICAL EXAMINATION:  Physical Exam  GENERAL:  60 y.o.-year-old African-American female patient lying in the bed with mild respiratory distress with conversational dyspnea.   EYES: Pupils equal, round, reactive to light and accommodation. No scleral icterus. Extraocular muscles intact.  HEENT: Head atraumatic, normocephalic. Oropharynx and nasopharynx clear.  NECK:  Supple, no jugular venous distention. No thyroid enlargement, no tenderness.  LUNGS: Diffuse expiratory wheezes with tight expiratory airflow and harsh vesicular breathing. CARDIOVASCULAR: Regular rate and rhythm, S1, S2 normal. No murmurs, rubs, or gallops.  ABDOMEN: Soft, nondistended, nontender. Bowel sounds present. No organomegaly or mass.  EXTREMITIES: 1+ bilateral lower extremity pitting edema, with no cyanosis, or clubbing.  NEUROLOGIC: Cranial nerves II through XII are intact. Muscle strength 5/5 in all extremities. Sensation intact.  Gait not checked.  PSYCHIATRIC: The patient is alert and oriented x 3.  Normal affect and good eye contact. SKIN: No obvious rash, lesion, or ulcer.   LABORATORY PANEL:   CBC Recent Labs  Lab 10/03/20 1847  WBC 10.1  HGB 12.3  HCT 38.9  PLT 262   ------------------------------------------------------------------------------------------------------------------  Chemistries  Recent Labs  Lab 10/03/20 1847  NA 143  K 3.8  CL 107  CO2 25  GLUCOSE 94  BUN 13  CREATININE 0.78  CALCIUM 8.7*  AST 23  ALT 14  ALKPHOS 86  BILITOT 0.6   ------------------------------------------------------------------------------------------------------------------  Cardiac Enzymes No results for input(s): TROPONINI in the last 168 hours. ------------------------------------------------------------------------------------------------------------------  RADIOLOGY:  DG Chest 1 View  Result Date: 10/03/2020 CLINICAL DATA:  Shortness of breath EXAM: CHEST  1 VIEW COMPARISON:  None. FINDINGS: The heart size and mediastinal contours are within normal limits. Both lungs are clear. The visualized skeletal structures are unremarkable. IMPRESSION: No active disease. Electronically Signed   By: 12/01/2020 M.D.   On: 10/03/2020 19:02      IMPRESSION AND PLAN:  1.  COPD acute exacerbation like secondary to acute bronchitis with subsequent acute hypoxic respiratory failure. -  The patient will be admitted to a progressive unit bed. -We will continue nebulized bronchodilator therapy with DuoNebs on a scheduled and as needed basis. -Continue steroid therapy with IV Solu-Medrol. -We will continue antibiotic therapy with IV Rocephin and p.o. Zithromax. -We will follow sputum culture as well as blood cultures. -We will hold off her Symbicort.  2.  Elevated troponin I. -This like secondary to demand ischemia. -We will follow further troponin I's and EKGs. -We will obtain a chest CTA to rule out PE could  be contributing to her hypoxia and elevated troponin I.  3.  Hypertensive urgency -Will continue Norvasc and place the patient on as needed IV labetalol.  4.  DVT prophylaxis. -Subcutaneous Lovenox.  All the records are reviewed and case discussed with ED provider. The plan of care was discussed in details with the patient (and family). I answered all questions. The patient agreed to proceed with the above mentioned plan. Further management will depend upon hospital course.   CODE STATUS: Full code  Status is: Inpatient  Remains inpatient appropriate because:Ongoing diagnostic testing needed not appropriate for outpatient work up, Unsafe d/c plan, IV treatments appropriate due to intensity of illness or inability to take PO and Inpatient level of care appropriate due to severity of illness   Dispo: The patient is from: Home              Anticipated d/c is to: Home              Anticipated d/c date is: 3 days              Patient currently is not medically stable to d/c.    TOTAL TIME TAKING CARE OF THIS PATIENT: 55 minutes.    Christel Mormon M.D on 10/03/2020 at 9:47 PM  [facility]   From 7 PM-7 AM, contact night-coverage [email or web address]  CC: Primary care physician; Administration, [employer]

## 2020-10-04 DIAGNOSIS — J441 Chronic obstructive pulmonary disease with (acute) exacerbation: Secondary | ICD-10-CM | POA: Diagnosis not present

## 2020-10-04 LAB — CBC
HCT: 41.4 % (ref 36.0–46.0)
Hemoglobin: 13.2 g/dL (ref 12.0–15.0)
MCH: 27.8 pg (ref 26.0–34.0)
MCHC: 31.9 g/dL (ref 30.0–36.0)
MCV: 87.2 fL (ref 80.0–100.0)
Platelets: 271 10*3/uL (ref 150–400)
RBC: 4.75 MIL/uL (ref 3.87–5.11)
RDW: 13 % (ref 11.5–15.5)
WBC: 10 10*3/uL (ref 4.0–10.5)
nRBC: 0 % (ref 0.0–0.2)

## 2020-10-04 LAB — TROPONIN I (HIGH SENSITIVITY): Troponin I (High Sensitivity): 67 ng/L — ABNORMAL HIGH (ref <18)

## 2020-10-04 LAB — BASIC METABOLIC PANEL
Anion gap: 11 (ref 5–15)
BUN: 10 mg/dL (ref 6–20)
CO2: 24 mmol/L (ref 22–32)
Calcium: 9.6 mg/dL (ref 8.9–10.3)
Chloride: 106 mmol/L (ref 98–111)
Creatinine, Ser: 0.7 mg/dL (ref 0.44–1.00)
GFR, Estimated: >60 mL/min (ref >60)
Glucose, Bld: 166 mg/dL — ABNORMAL HIGH (ref 70–99)
Potassium: 4 mmol/L (ref 3.5–5.1)
Sodium: 141 mmol/L (ref 135–145)

## 2020-10-04 LAB — HIV ANTIBODY (ROUTINE TESTING W REFLEX): HIV Screen 4th Generation wRfx: NONREACTIVE

## 2020-10-04 MED ORDER — ALBUTEROL SULFATE (2.5 MG/3ML) 0.083% IN NEBU: 2.5000 mg | INHALATION_SOLUTION | RESPIRATORY_TRACT | Status: DC | PRN

## 2020-10-04 MED ORDER — HYDRALAZINE HCL 25 MG PO TABS
25.0000 mg | ORAL_TABLET | Freq: Three times a day (TID) | ORAL | Status: DC
Start: 2020-10-04 — End: 2020-10-06
  Administered 2020-10-05 – 2020-10-06 (×4): 25 mg via ORAL
  Filled 2020-10-04 (×8): qty 1

## 2020-10-04 MED ORDER — LOSARTAN POTASSIUM 50 MG PO TABS
100.0000 mg | ORAL_TABLET | Freq: Every day | ORAL | Status: DC
  Filled 2020-10-04 (×3): qty 2

## 2020-10-04 MED ORDER — HYDROCHLOROTHIAZIDE 25 MG PO TABS
25.0000 mg | ORAL_TABLET | Freq: Every day | ORAL | Status: DC
  Filled 2020-10-04 (×3): qty 1

## 2020-10-04 MED ORDER — GUAIFENESIN-DM 100-10 MG/5ML PO SYRP: 5.0000 mL | ORAL_SOLUTION | ORAL | Status: DC | PRN

## 2020-10-04 MED ORDER — DM-GUAIFENESIN ER 30-600 MG PO TB12
1.0000 | ORAL_TABLET | Freq: Two times a day (BID) | ORAL | Status: DC
  Administered 2020-10-04 – 2020-10-07 (×6): 1 via ORAL
  Filled 2020-10-04 (×6): qty 1

## 2020-10-04 NOTE — ED Notes (Signed)
Patient assisted to bathroom at this time. Patient becomes SHOB when ambulating and off oxygen. Patient returned to stretcher, given warm blanket, graham cracker and ginger ale per request.

## 2020-10-04 NOTE — ED Notes (Signed)
Admitting MD messaged to DC covid isolations. Patient is covid negative and no longer requires isolation.

## 2020-10-04 NOTE — Hospital Course (Addendum)
Cindy Gutierrez  is a 60 y.o. female with a known history of COPD and hypertension, presented to the ED on the evening of 10/03/2020 with progressively worsening shortness of breath, cough with difficulty producing sputum, and wheezing over the past couple days.  She reported intermittent similar symptoms over the past few weeks and had completed 2 courses of Z-Pak and prednisone as outpatient.  In the ED, patient was tachycardic and tachypneic with respiratory rate of 40, initially placed on BiPAP and since tapered down to 2 L/min nasal cannula oxygen.  Admitted for further evaluation management of apparent COPD with acute exacerbation and question of pneumonia /bronchitis

## 2020-10-04 NOTE — ED Notes (Addendum)
This RN called lab to draw patients admission AM labs d/t this RN being unable to successfully draw them. Lab will send someone as soon as they have someone available. Requested lab to reschedule admission blood work that was due at 2100 on 10/03/2020.

## 2020-10-04 NOTE — ED Notes (Signed)
Patient assisted to bathroom at this time.

## 2020-10-04 NOTE — ED Notes (Signed)
Pt given meal tray at this time 

## 2020-10-04 NOTE — Progress Notes (Addendum)
PROGRESS NOTE    Cindy Gutierrez   [id number]  DOB: 1960-10-05  [facility]    DOA: 10/03/2020 LOS: 1   Brief Narrative   Cindy Gutierrez  is a 60 y.o. African-American female with a known history of COPD and hypertension, presented to the ED on the evening of 10/03/2020 with progressively worsening shortness of breath, cough with difficulty producing sputum, and wheezing over the past couple days.  She reported intermittent similar symptoms over the past few weeks and had completed 2 courses of Z-Pak and prednisone as outpatient.  In the ED, patient was tachycardic and tachypneic with respiratory rate of 40, initially placed on BiPAP and since tapered down to 2 L/min nasal cannula oxygen.  Admitted for further evaluation management of apparent COPD with acute exacerbation and question of pneumonia /bronchitis     Assessment & Plan   Active Problems:   COPD exacerbation ([place])   Acute respiratory failure with hypoxia secondary to acute exacerbation of COPD with acute bronchitis -  --Continue empiric Rocephin and Zithromax --Continue IV Solu-Medrol, will transition to prednisone possibly tomorrow if improved --Follow-up blood and sputum cultures --Hold Symbicort for now --Continue scheduled DuoNebs and as needed albuterol --Scheduled Mucinex with as needed Robitussin syrup --Supplemental oxygen as needed, wean as tolerated, maintain O2 sat at or above 90%  Elevated troponin -likely demand ischemia in the setting of hypoxia.  Patient does not have EKGs or acute ischemic changes on EKG. CTA was negative for PE.  Hypertensive urgency -present on admission, continue amlodipine and as needed IV labetalol.   Home meds on hold: metoprolol (avoid beta-blocker for now with COPD exacerbation) --Resumed home HCTZ and losartan   Thyroid nodule -with history of multinodular goiter status post surgery in the past.   CTA chest on 10/03/2020 showed multinodular thyroid goiter  with apparent enlargement of the dominant nodule in the left thyroid lobe.  Recommendation for dedicated thyroid ultrasound for further evaluation.  Will discuss with patient prior to ordering study here vs outpatient follow up.  Pulmonary nodule -seen on CTA chest on 10/03/2020.  6 mm indeterminate right upper lobe pulmonary nodule.  Recommendation is noncontrast CT chest in 6 to 12 months.   Patient BMI: Body mass index is 22.96 kg/m.   DVT prophylaxis: enoxaparin (LOVENOX) injection 40 mg Start: 10/03/20 2200   Diet:  Diet Orders (From admission, onward)    Start     Ordered   10/04/20 1106  Diet regular Room service appropriate? Yes; Fluid consistency: Thin  Diet effective now       Question Answer Comment  Room service appropriate? Yes   Fluid consistency: Thin      10/04/20 1105            Code Status: Full Code    Subjective 10/04/20    Patient seen today in the ED while holding for a bed.  She reports feeling better.  Says she could not tolerate BiPAP mask.  Expresses frustration about multiple prednisone and Z-Paks as outpatient is still not better.  Denies fevers or chills this morning.  Cough slightly improved.  Disposition Plan & Communication   Status is: Inpatient  Remains inpatient appropriate because:IV treatments appropriate due to intensity of illness or inability to take PO   Dispo: The patient is from: Home              Anticipated d/c is to: Home  Anticipated d/c date is: 1 to 2 days              Patient currently is not medically stable to d/c.    Family Communication: None at bedside, will attempt to call   Consults, Procedures, Significant Events   Consultants:   None  Procedures:   None  Antimicrobials:  Anti-infectives (From admission, onward)   Start     Dose/Rate Route Frequency Ordered Stop   10/03/20 2200  cefTRIAXone (ROCEPHIN) 1 g in sodium chloride 0.9 % 100 mL IVPB        1 g 200 mL/hr over 30 Minutes  Intravenous Every 24 hours 10/03/20 2146 10/08/20 2144         Objective   Vitals:   10/04/20 0730 10/04/20 0920 10/04/20 0930 10/04/20 1300  BP: (!) 165/63 (!) 152/64 (!) 162/66 (!) 165/71  Pulse: 61  65 67  Resp: 18  18 (!) 26  Temp:      TempSrc:      SpO2: 98%  99% 96%  Weight:      Height:       No intake or output data in the 24 hours ending 10/04/20 1513 Filed Weights   10/03/20 1850  Weight: 72.6 kg    Physical Exam:  General exam: awake, alert, no acute distress HEENT: moist mucus membranes, hearing grossly normal  Respiratory system: Very poor aeration, no active wheezes, mildly increased respiratory effort. Cardiovascular system: normal S1/S2, RRR, no pedal edema.   Gastrointestinal system: soft, NT, ND, +bowel sounds. Central nervous system: A&O x3. no gross focal neurologic deficits, normal speech Extremities: moves all, no edema, normal tone Psychiatry: normal mood, congruent affect, judgement and insight appear normal  Labs   Data Reviewed: I have personally reviewed following labs and imaging studies  CBC: Recent Labs  Lab 10/03/20 1847 10/04/20 0649  WBC 10.1 10.0  NEUTROABS 6.8  --   HGB 12.3 13.2  HCT 38.9 41.4  MCV 87.4 87.2  PLT 262 [id number]   Basic Metabolic Panel: Recent Labs  Lab 10/03/20 1847 10/04/20 0649  NA 143 141  K 3.8 4.0  CL 107 106  CO2 25 24  GLUCOSE 94 166*  BUN 13 10  CREATININE 0.78 0.70  CALCIUM 8.7* 9.6   GFR: Estimated Creatinine Clearance: 80.9 mL/min (by C-G formula based on SCr of 0.7 mg/dL). Liver Function Tests: Recent Labs  Lab 10/03/20 1847  AST 23  ALT 14  ALKPHOS 86  BILITOT 0.6  PROT 6.4*  ALBUMIN 3.4*   No results for input(s): LIPASE, AMYLASE in the last 168 hours. No results for input(s): AMMONIA in the last 168 hours. Coagulation Profile: No results for input(s): INR, PROTIME in the last 168 hours. Cardiac Enzymes: No results for input(s): CKTOTAL, CKMB, CKMBINDEX, TROPONINI in the  last 168 hours. BNP (last 3 results) No results for input(s): PROBNP in the last 8760 hours. HbA1C: No results for input(s): HGBA1C in the last 72 hours. CBG: No results for input(s): GLUCAP in the last 168 hours. Lipid Profile: No results for input(s): CHOL, HDL, LDLCALC, TRIG, CHOLHDL, LDLDIRECT in the last 72 hours. Thyroid Function Tests: No results for input(s): TSH, T4TOTAL, FREET4, T3FREE, THYROIDAB in the last 72 hours. Anemia Panel: No results for input(s): VITAMINB12, FOLATE, FERRITIN, TIBC, IRON, RETICCTPCT in the last 72 hours. Sepsis Labs: Recent Labs  Lab 10/03/20 1847  PROCALCITON <0.10    Recent Results (from the past 240 hour(s))  Resp Panel by RT-PCR (  Flu A&B, Covid) Nasopharyngeal Swab     Status: None   Collection Time: 10/03/20  7:45 PM   Specimen: Nasopharyngeal Swab; Nasopharyngeal(NP) swabs in vial transport medium  Result Value Ref Range Status   SARS Coronavirus 2 by RT PCR NEGATIVE NEGATIVE Final    Comment: (NOTE) SARS-CoV-2 target nucleic acids are NOT DETECTED.  The SARS-CoV-2 RNA is generally detectable in upper respiratory specimens during the acute phase of infection. The lowest concentration of SARS-CoV-2 viral copies this assay can detect is 138 copies/mL. A negative result does not preclude SARS-Cov-2 infection and should not be used as the sole basis for treatment or other patient management decisions. A negative result may occur with  improper specimen collection/handling, submission of specimen other than nasopharyngeal swab, presence of viral mutation(s) within the areas targeted by this assay, and inadequate number of viral copies(<138 copies/mL). A negative result must be combined with clinical observations, patient history, and epidemiological information. The expected result is Negative.  Fact Sheet for Patients:  BloggerCourse.com  Fact Sheet for Healthcare Providers:   SeriousBroker.it  This test is no t yet approved or cleared by the [place] FDA and  has been authorized for detection and/or diagnosis of SARS-CoV-2 by FDA under an Emergency Use Authorization (EUA). This EUA will remain  in effect (meaning this test can be used) for the duration of the COVID-19 declaration under Section 564(b)(1) of the Act, 21 U.S.C.section 360bbb-3(b)(1), unless the authorization is terminated  or revoked sooner.       Influenza A by PCR NEGATIVE NEGATIVE Final   Influenza B by PCR NEGATIVE NEGATIVE Final    Comment: (NOTE) The Xpert Xpress SARS-CoV-2/FLU/RSV plus assay is intended as an aid in the diagnosis of influenza from Nasopharyngeal swab specimens and should not be used as a sole basis for treatment. Nasal washings and aspirates are unacceptable for Xpert Xpress SARS-CoV-2/FLU/RSV testing.  Fact Sheet for Patients: BloggerCourse.com  Fact Sheet for Healthcare Providers: SeriousBroker.it  This test is not yet approved or cleared by the [place] FDA and has been authorized for detection and/or diagnosis of SARS-CoV-2 by FDA under an Emergency Use Authorization (EUA). This EUA will remain in effect (meaning this test can be used) for the duration of the COVID-19 declaration under Section 564(b)(1) of the Act, 21 U.S.C. section 360bbb-3(b)(1), unless the authorization is terminated or revoked.  Performed at [facility], [address] [place], [place] [address]       Imaging Studies   DG Chest 1 View  Result Date: 10/03/2020 CLINICAL DATA:  Shortness of breath EXAM: CHEST  1 VIEW COMPARISON:  None. FINDINGS: The heart size and mediastinal contours are within normal limits. Both lungs are clear. The visualized skeletal structures are unremarkable. IMPRESSION: No active disease. Electronically Signed   By: Jonna Clark M.D.   On: 10/03/2020 19:02   CT  ANGIO CHEST PE W OR WO CONTRAST  Result Date: 10/04/2020 CLINICAL DATA:  Dyspnea, hypoxia, elevated serum troponin level EXAM: CT ANGIOGRAPHY CHEST WITH CONTRAST TECHNIQUE: Multidetector CT imaging of the chest was performed using the standard protocol during bolus administration of intravenous contrast. Multiplanar CT image reconstructions and MIPs were obtained to evaluate the vascular anatomy. CONTRAST:  OMNIPAQUE IOHEXOL 350 MG/ML SOLN COMPARISON:  12/18/2017 FINDINGS: Cardiovascular: There is adequate opacification of the pulmonary arterial tree. No intraluminal filling defect identified to suggest acute pulmonary embolism. The central pulmonary arteries are enlarged in keeping with changes of pulmonary artery hypertension. Global cardiac  size is within normal limits. No pericardial effusion. Mild atherosclerotic calcification within the thoracic aorta. No aortic aneurysm. Mediastinum/Nodes: Multinodular thyroid goiter is present with the dominant nodule measuring 3.4 cm in greatest dimension. This is appears enlarged when compared to prior sonogram of 09/23/2013, though is not well characterized on this examination. Numerous surgical clips inferior to the right thyroid lobe may reflect sequela of parathyroidectomy. No pathologic thoracic adenopathy. The esophagus is unremarkable. Lungs/Pleura: Moderate centrilobular emphysema. 6 mm subpleural pulmonary nodule within the right apex, axial image # 25/6, is new from prior examination and indeterminate. Mild parenchymal scarring at the left lung base adjacent to remote rib fracture. The lungs are otherwise clear. No pneumothorax or pleural effusion. Central airways are widely patent. Upper Abdomen: No acute abnormality. Musculoskeletal: Multiple healed left rib fractures are noted. No acute bone abnormality. Review of the MIP images confirms the above findings. IMPRESSION: No pulmonary embolism. 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 Multinodular thyroid goiter with apparent enlargement of the dominant nodule within the left thyroid lobe. Dedicated thyroid sonography is recommended for further evaluation. (Ref: J Am Coll Radiol. 2015 Feb;12(2): 143-50). 6 mm indeterminate right upper lobe pulmonary nodule Non-contrast chest CT at 6-12 months is recommended. If the nodule is stable at time of repeat CT, then future CT at 18-24 months (from today's scan) is considered optional for low-risk patients, but is recommended for high-risk patients. This recommendation follows the consensus statement: Guidelines for Management of Incidental Pulmonary Nodules Detected on CT Images: From the Fleischner Society 2017; Radiology 2017; 284:228-243. Moderate centrilobular emphysema Aortic Atherosclerosis (ICD10-I70.0) and Emphysema (ICD10-J43.9). Electronically Signed   By: Fidela Salisbury MD   On: 10/04/2020 00:12     Medications   Scheduled Meds: . amLODipine  10 mg Oral Daily  . dextromethorphan-guaiFENesin  1 tablet Oral BID  . enoxaparin (LOVENOX) injection  40 mg Subcutaneous Q24H  . hydrochlorothiazide  25 mg Oral Daily  . ipratropium-albuterol  3 mL Nebulization QID  . losartan  100 mg Oral Daily  . methylPREDNISolone (SOLU-MEDROL) injection  40 mg Intravenous Q6H   Followed by  . [START ON 10/05/2020] predniSONE  40 mg Oral Q breakfast   Continuous Infusions: . cefTRIAXone (ROCEPHIN)  IV Stopped (10/03/20 2347)       LOS: 1 day    Time spent: 30 minutes    Ezekiel Slocumb, DO [facility]  10/04/2020, 3:13 PM    If 7PM-7AM, please contact night-coverage. How to contact the [facility] Attending or Consulting provider [place] or covering provider during after hours [place], for this patient?    1. Check the care team in [facility] and look for a) attending/consulting [facility] provider listed and b) the [facility] team listed 2. Log into [email or web address] and use [employer]'s universal password to access. If you do not have the  password, please contact the hospital operator. 3. Locate the [facility] provider you are looking for under [facility] and page to a number that you can be directly reached. 4. If you still have difficulty reaching the provider, please page the [facility] (Director on Call) for the Hospitalists listed on amion for assistance.

## 2020-10-05 DIAGNOSIS — J441 Chronic obstructive pulmonary disease with (acute) exacerbation: Secondary | ICD-10-CM | POA: Diagnosis not present

## 2020-10-05 LAB — TROPONIN I (HIGH SENSITIVITY): Troponin I (High Sensitivity): 52 ng/L — ABNORMAL HIGH (ref <18)

## 2020-10-05 MED ORDER — METHYLPREDNISOLONE SODIUM SUCC 125 MG IJ SOLR
60.0000 mg | Freq: Two times a day (BID) | INTRAMUSCULAR | Status: AC
  Administered 2020-10-05 – 2020-10-06 (×4): 60 mg via INTRAVENOUS
  Filled 2020-10-05 (×4): qty 2

## 2020-10-05 MED ORDER — ARFORMOTEROL TARTRATE 15 MCG/2ML IN NEBU
15.0000 ug | INHALATION_SOLUTION | Freq: Two times a day (BID) | RESPIRATORY_TRACT | Status: DC
  Administered 2020-10-05 – 2020-10-07 (×4): 15 ug via RESPIRATORY_TRACT
  Filled 2020-10-05 (×6): qty 2

## 2020-10-05 MED ORDER — BUDESONIDE 0.25 MG/2ML IN SUSP
0.2500 mg | Freq: Two times a day (BID) | RESPIRATORY_TRACT | Status: DC
  Administered 2020-10-05 – 2020-10-07 (×4): 0.25 mg via RESPIRATORY_TRACT
  Filled 2020-10-05 (×4): qty 2

## 2020-10-05 MED ORDER — AZITHROMYCIN 250 MG PO TABS
500.0000 mg | ORAL_TABLET | Freq: Every day | ORAL | Status: AC
  Administered 2020-10-05 – 2020-10-07 (×3): 500 mg via ORAL
  Filled 2020-10-05 (×3): qty 2

## 2020-10-05 NOTE — Plan of Care (Signed)
  Problem: Activity: Goal: Risk for activity intolerance will decrease Reactivated   Problem: Nutrition: Goal: Adequate nutrition will be maintained Reactivated   Problem: Coping: Goal: Level of anxiety will decrease Reactivated   Problem: Activity: Goal: Risk for activity intolerance will decrease Reactivated

## 2020-10-05 NOTE — ED Notes (Signed)
Report taken by Marily Memos, RN. Pt to be transported to floor.

## 2020-10-05 NOTE — Progress Notes (Addendum)
PROGRESS NOTE    Cindy MendsCarol D Gutierrez   WUJ:[id number]  DOB: 1960-11-06  [facility]    DOA: 10/03/2020 LOS: 2   Brief Narrative   Cindy SaltsCarol Gutierrez  is a 60 y.o. African-American female with a known history of COPD and hypertension, presented to the ED on the evening of 10/03/2020 with progressively worsening shortness of breath, cough with difficulty producing sputum, and wheezing over the past couple days.  She reported intermittent similar symptoms over the past few weeks and had completed 2 courses of Z-Pak and prednisone as outpatient.  In the ED, patient was tachycardic and tachypneic with respiratory rate of 40, initially placed on BiPAP and since tapered down to 2 L/min nasal cannula oxygen.  Admitted for further evaluation management of apparent COPD with acute exacerbation and question of pneumonia /bronchitis     Assessment & Plan   Active Problems:   COPD exacerbation (HCC)   Acute respiratory failure with hypoxia secondary to acute exacerbation of COPD with acute bronchitis -  On admission, patient reported symptoms over the past few weeks having not improved after 2 courses of outpatient prednisone and Z-Pak. --Continue empiric Rocephin and Zithromax --Continue IV Solu-Medrol at 60 mg twice daily, hold on transition to prednisone until oxygen requirement improving --Follow-up blood and sputum cultures --Pulmicort and Brovana nebs --Continue scheduled DuoNebs and as needed albuterol --Scheduled Mucinex with as needed Robitussin syrup --Supplemental oxygen as needed, wean as tolerated, maintain O2 sat at or above 90% --Consider pulmonology consult   Elevated troponin -likely demand ischemia in the setting of hypoxia.  Patient does not have EKGs or acute ischemic changes on EKG. CTA was negative for PE.   Hypertensive urgency -present on admission, continue amlodipine and as needed IV labetalol.   Home meds on hold: metoprolol (avoid beta-blocker for now with  COPD exacerbation) --Resumed home HCTZ and losartan   Thyroid nodule -with history of multinodular goiter status post surgery in the past.   CTA chest on 10/03/2020 showed multinodular thyroid goiter with apparent enlargement of the dominant nodule in the left thyroid lobe.  Recommendation for dedicated thyroid ultrasound for further evaluation.  Will discuss with patient prior to ordering study here vs outpatient follow up.   Pulmonary nodule -seen on CTA chest on 10/03/2020.  6 mm indeterminate right upper lobe pulmonary nodule.  Recommendation is noncontrast CT chest in 6 to 12 months.   Patient BMI: Body mass index is 22.96 kg/m.   DVT prophylaxis: enoxaparin (LOVENOX) injection 40 mg Start: 10/03/20 2200   Diet:  Diet Orders (From admission, onward)    Start     Ordered   10/04/20 1106  Diet regular Room service appropriate? Yes; Fluid consistency: Thin  Diet effective now       Question Answer Comment  Room service appropriate? Yes   Fluid consistency: Thin      10/04/20 1105            Code Status: Full Code    Subjective 10/05/20    Patient seen at bedside just after being up to the bathroom.  She is severely short of breath and using accessory muscles to breathe.  Her oxygen need has gone up from 2 L yesterday to 4 to 5 L today.  No fevers or chills.  No other acute complaints besides shortness of breath.  Disposition Plan & Communication   Status is: Inpatient  Remains inpatient appropriate because:IV treatments appropriate due to intensity of illness or inability to take  PO.  Patient's oxygen requirement has increased over the past 24 hours, continues to require IV steroids as above   Dispo: The patient is from: Home              Anticipated d/c is to: Home              Anticipated d/c date is: 2 days              Patient currently is not medically stable to d/c.    Family Communication: None at bedside, will attempt to call   Consults, Procedures,  Significant Events   Consultants:   None  Procedures:   None  Antimicrobials:  Anti-infectives (From admission, onward)   Start     Dose/Rate Route Frequency Ordered Stop   10/05/20 1545  azithromycin (ZITHROMAX) tablet 500 mg        500 mg Oral Daily 10/05/20 1458 10/08/20 0959   10/03/20 2200  cefTRIAXone (ROCEPHIN) 1 g in sodium chloride 0.9 % 100 mL IVPB        1 g 200 mL/hr over 30 Minutes Intravenous Every 24 hours 10/03/20 2146 10/08/20 2144         Objective   Vitals:   10/05/20 0100 10/05/20 0205 10/05/20 0524 10/05/20 1454  BP:  (!) 160/61 (!) 168/69 (!) 148/60  Pulse: (!) 59 66 68 82  Resp:  20 18 16   Temp:   98.1 F (36.7 C) 98.7 F (37.1 C)  TempSrc:   Oral Oral  SpO2: 98% 95% 100% 97%  Weight:      Height:        Intake/Output Summary (Last 24 hours) at 10/05/2020 1606 Last data filed at 10/05/2020 1040 Gross per 24 hour  Intake 360 ml  Output 0 ml  Net 360 ml   Filed Weights   10/03/20 1850  Weight: 72.6 kg    Physical Exam:  General exam: awake, alert, no acute distress Respiratory system: Overall severely diminished, no wheezes, significantly increased respiratory effort after returning from bathroom, conversational dyspnea, on 4 L/min nasal cannula oxygen. Cardiovascular system: normal S1/S2, RRR, no pedal edema.   Central nervous system: A&O x3. normal speech, CNs 2 through 12 grossly intact Psychiatry: normal mood, congruent affect, judgement and insight appear normal  Labs   Data Reviewed: I have personally reviewed following labs and imaging studies  CBC: Recent Labs  Lab 10/03/20 1847 10/04/20 0649  WBC 10.1 10.0  NEUTROABS 6.8  --   HGB 12.3 13.2  HCT 38.9 41.4  MCV 87.4 87.2  PLT 262 [id number]   Basic Metabolic Panel: Recent Labs  Lab 10/03/20 1847 10/04/20 0649  NA 143 141  K 3.8 4.0  CL 107 106  CO2 25 24  GLUCOSE 94 166*  BUN 13 10  CREATININE 0.78 0.70  CALCIUM 8.7* 9.6   GFR: Estimated Creatinine Clearance:  80.9 mL/min (by C-G formula based on SCr of 0.7 mg/dL). Liver Function Tests: Recent Labs  Lab 10/03/20 1847  AST 23  ALT 14  ALKPHOS 86  BILITOT 0.6  PROT 6.4*  ALBUMIN 3.4*   No results for input(s): LIPASE, AMYLASE in the last 168 hours. No results for input(s): AMMONIA in the last 168 hours. Coagulation Profile: No results for input(s): INR, PROTIME in the last 168 hours. Cardiac Enzymes: No results for input(s): CKTOTAL, CKMB, CKMBINDEX, TROPONINI in the last 168 hours. BNP (last 3 results) No results for input(s): PROBNP in the last 8760 hours. HbA1C: No  results for input(s): HGBA1C in the last 72 hours. CBG: No results for input(s): GLUCAP in the last 168 hours. Lipid Profile: No results for input(s): CHOL, HDL, LDLCALC, TRIG, CHOLHDL, LDLDIRECT in the last 72 hours. Thyroid Function Tests: No results for input(s): TSH, T4TOTAL, FREET4, T3FREE, THYROIDAB in the last 72 hours. Anemia Panel: No results for input(s): VITAMINB12, FOLATE, FERRITIN, TIBC, IRON, RETICCTPCT in the last 72 hours. Sepsis Labs: Recent Labs  Lab 10/03/20 1847  PROCALCITON <0.10    Recent Results (from the past 240 hour(s))  Resp Panel by RT-PCR (Flu A&B, Covid) Nasopharyngeal Swab     Status: None   Collection Time: 10/03/20  7:45 PM   Specimen: Nasopharyngeal Swab; Nasopharyngeal(NP) swabs in vial transport medium  Result Value Ref Range Status   SARS Coronavirus 2 by RT PCR NEGATIVE NEGATIVE Final    Comment: (NOTE) SARS-CoV-2 target nucleic acids are NOT DETECTED.  The SARS-CoV-2 RNA is generally detectable in upper respiratory specimens during the acute phase of infection. The lowest concentration of SARS-CoV-2 viral copies this assay can detect is 138 copies/mL. A negative result does not preclude SARS-Cov-2 infection and should not be used as the sole basis for treatment or other patient management decisions. A negative result may occur with  improper specimen collection/handling,  submission of specimen other than nasopharyngeal swab, presence of viral mutation(s) within the areas targeted by this assay, and inadequate number of viral copies(<138 copies/mL). A negative result must be combined with clinical observations, patient history, and epidemiological information. The expected result is Negative.  Fact Sheet for Patients:  EntrepreneurPulse.com.au  Fact Sheet for Healthcare Providers:  IncredibleEmployment.be  This test is no t yet approved or cleared by the [place] FDA and  has been authorized for detection and/or diagnosis of SARS-CoV-2 by FDA under an Emergency Use Authorization (EUA). This EUA will remain  in effect (meaning this test can be used) for the duration of the COVID-19 declaration under Section 564(b)(1) of the Act, 21 U.S.C.section 360bbb-3(b)(1), unless the authorization is terminated  or revoked sooner.       Influenza A by PCR NEGATIVE NEGATIVE Final   Influenza B by PCR NEGATIVE NEGATIVE Final    Comment: (NOTE) The Xpert Xpress SARS-CoV-2/FLU/RSV plus assay is intended as an aid in the diagnosis of influenza from Nasopharyngeal swab specimens and should not be used as a sole basis for treatment. Nasal washings and aspirates are unacceptable for Xpert Xpress SARS-CoV-2/FLU/RSV testing.  Fact Sheet for Patients: EntrepreneurPulse.com.au  Fact Sheet for Healthcare Providers: IncredibleEmployment.be  This test is not yet approved or cleared by the [place] FDA and has been authorized for detection and/or diagnosis of SARS-CoV-2 by FDA under an Emergency Use Authorization (EUA). This EUA will remain in effect (meaning this test can be used) for the duration of the COVID-19 declaration under Section 564(b)(1) of the Act, 21 U.S.C. section 360bbb-3(b)(1), unless the authorization is terminated or revoked.  Performed at [facility], [address] [place], [place] [address]       Imaging Studies   DG Chest 1 View  Result Date: 10/03/2020 CLINICAL DATA:  Shortness of breath EXAM: CHEST  1 VIEW COMPARISON:  None. FINDINGS: The heart size and mediastinal contours are within normal limits. Both lungs are clear. The visualized skeletal structures are unremarkable. IMPRESSION: No active disease. Electronically Signed   By: Prudencio Pair M.D.   On: 10/03/2020 19:02   CT ANGIO CHEST PE W OR WO CONTRAST  Result  Date: 10/04/2020 CLINICAL DATA:  Dyspnea, hypoxia, elevated serum troponin level EXAM: CT ANGIOGRAPHY CHEST WITH CONTRAST TECHNIQUE: Multidetector CT imaging of the chest was performed using the standard protocol during bolus administration of intravenous contrast. Multiplanar CT image reconstructions and MIPs were obtained to evaluate the vascular anatomy. CONTRAST:  OMNIPAQUE IOHEXOL 350 MG/ML SOLN COMPARISON:  12/18/2017 FINDINGS: Cardiovascular: There is adequate opacification of the pulmonary arterial tree. No intraluminal filling defect identified to suggest acute pulmonary embolism. The central pulmonary arteries are enlarged in keeping with changes of pulmonary artery hypertension. Global cardiac size is within normal limits. No pericardial effusion. Mild atherosclerotic calcification within the thoracic aorta. No aortic aneurysm. Mediastinum/Nodes: Multinodular thyroid goiter is present with the dominant nodule measuring 3.4 cm in greatest dimension. This is appears enlarged when compared to prior sonogram of 09/23/2013, though is not well characterized on this examination. Numerous surgical clips inferior to the right thyroid lobe may reflect sequela of parathyroidectomy. No pathologic thoracic adenopathy. The esophagus is unremarkable. Lungs/Pleura: Moderate centrilobular emphysema. 6 mm subpleural pulmonary nodule within the right apex, axial image # 25/6, is new from prior examination and indeterminate. Mild parenchymal  scarring at the left lung base adjacent to remote rib fracture. The lungs are otherwise clear. No pneumothorax or pleural effusion. Central airways are widely patent. Upper Abdomen: No acute abnormality. Musculoskeletal: Multiple healed left rib fractures are noted. No acute bone abnormality. Review of the MIP images confirms the above findings. IMPRESSION: No pulmonary embolism. Morphologic changes in keeping with pulmonary arterial hypertension. Multinodular thyroid goiter with apparent enlargement of the dominant nodule within the left thyroid lobe. Dedicated thyroid sonography is recommended for further evaluation. (Ref: J Am Coll Radiol. 2015 Feb;12(2): 143-50). 6 mm indeterminate right upper lobe pulmonary nodule Non-contrast chest CT at 6-12 months is recommended. If the nodule is stable at time of repeat CT, then future CT at 18-24 months (from today's scan) is considered optional for low-risk patients, but is recommended for high-risk patients. This recommendation follows the consensus statement: Guidelines for Management of Incidental Pulmonary Nodules Detected on CT Images: From the Fleischner Society 2017; Radiology 2017; 284:228-243. Moderate centrilobular emphysema Aortic Atherosclerosis (ICD10-I70.0) and Emphysema (ICD10-J43.9). Electronically Signed   By: Helyn Numbers MD   On: 10/04/2020 00:12     Medications   Scheduled Meds: . amLODipine  10 mg Oral Daily  . azithromycin  500 mg Oral Daily  . dextromethorphan-guaiFENesin  1 tablet Oral BID  . enoxaparin (LOVENOX) injection  40 mg Subcutaneous Q24H  . hydrALAZINE  25 mg Oral Q8H  . hydrochlorothiazide  25 mg Oral Daily  . ipratropium-albuterol  3 mL Nebulization QID  . losartan  100 mg Oral Daily  . methylPREDNISolone (SOLU-MEDROL) injection  60 mg Intravenous Q12H   Continuous Infusions: . cefTRIAXone (ROCEPHIN)  IV Stopped (10/04/20 2235)       LOS: 2 days    Time spent: 25 minutes with greater than 50% spent at bedside  and coronation of care    Pennie Banter, DO [facility]  10/05/2020, 4:06 PM    If 7PM-7AM, please contact night-coverage. How to contact the [facility] Attending or Consulting provider 7A - 7P or covering provider during after hours 7P -7A, for this patient?    1. Check the care team in [facility] and look for a) attending/consulting [facility] provider listed and b) the [facility] team listed 2. Log into [email or web address] and use [place]'s universal password to access. If you do not have the password,  please contact the hospital operator. 3. Locate the [facility] provider you are looking for under [facility] and page to a number that you can be directly reached. 4. If you still have difficulty reaching the provider, please page the [facility] (Director on Call) for the Hospitalists listed on amion for assistance.

## 2020-10-05 NOTE — ED Notes (Signed)
Pt able to transfer from bed to bsc, she is not able to ambulate across the hall to the bathroom due to exertional shortness of breath.

## 2020-10-06 DIAGNOSIS — J441 Chronic obstructive pulmonary disease with (acute) exacerbation: Secondary | ICD-10-CM | POA: Diagnosis not present

## 2020-10-06 MED ORDER — BISACODYL 5 MG PO TBEC
5.0000 mg | DELAYED_RELEASE_TABLET | Freq: Every day | ORAL | Status: DC | PRN
  Administered 2020-10-06: 5 mg via ORAL
  Filled 2020-10-06: qty 1

## 2020-10-06 MED ORDER — LABETALOL HCL 100 MG PO TABS
200.0000 mg | ORAL_TABLET | Freq: Two times a day (BID) | ORAL | Status: DC
  Administered 2020-10-06: 200 mg via ORAL
  Filled 2020-10-06: qty 2

## 2020-10-06 MED ORDER — HYDRALAZINE HCL 50 MG PO TABS: 50.0000 mg | ORAL_TABLET | Freq: Three times a day (TID) | ORAL | Status: DC

## 2020-10-06 MED ORDER — IPRATROPIUM-ALBUTEROL 0.5-2.5 (3) MG/3ML IN SOLN
3.0000 mL | Freq: Three times a day (TID) | RESPIRATORY_TRACT | Status: DC
  Administered 2020-10-06 – 2020-10-07 (×4): 3 mL via RESPIRATORY_TRACT
  Filled 2020-10-06 (×4): qty 3

## 2020-10-06 MED ORDER — METOPROLOL TARTRATE 50 MG PO TABS
50.0000 mg | ORAL_TABLET | Freq: Two times a day (BID) | ORAL | Status: DC
  Administered 2020-10-06 – 2020-10-07 (×2): 50 mg via ORAL
  Filled 2020-10-06 (×2): qty 1

## 2020-10-06 MED ORDER — PREDNISONE 20 MG PO TABS
40.0000 mg | ORAL_TABLET | Freq: Every day | ORAL | Status: DC
  Administered 2020-10-07: 40 mg via ORAL
  Filled 2020-10-06: qty 2

## 2020-10-06 NOTE — Progress Notes (Addendum)
PROGRESS NOTE    Cindy MendsCarol D Gutierrez   FAO:[id number]  DOB: 1961-05-31  [facility]    DOA: 10/03/2020 LOS: 3   Brief Narrative   Cindy SaltsCarol Gutierrez  is a 60 y.o. female with a known history of COPD and hypertension, presented to the ED on the evening of 10/03/2020 with progressively worsening shortness of breath, cough with difficulty producing sputum, and wheezing over the past couple days.  She reported intermittent similar symptoms over the past few weeks and had completed 2 courses of Z-Pak and prednisone as outpatient.  In the ED, patient was tachycardic and tachypneic with respiratory rate of 40, initially placed on BiPAP and since tapered down to 2 L/min nasal cannula oxygen.  Admitted for further evaluation management of apparent COPD with acute exacerbation and question of pneumonia /bronchitis     Assessment & Plan   Active Problems:   COPD exacerbation (HCC)   Acute respiratory failure with hypoxia secondary to acute exacerbation of COPD with acute bronchitis -  On admission, patient reported symptoms over the past few weeks having not improved after 2 courses of outpatient prednisone and Z-Pak. --Continue empiric Rocephin and Zithromax --Continue IV Solu-Medrol 60 mg BID today --Transition to Prednisone 40 mg daily tomorrow AM --Follow-up blood and sputum cultures --Pulmicort and Brovana nebs --Continue scheduled DuoNebs and as needed albuterol --Scheduled Mucinex with as needed Robitussin syrup --Supplemental oxygen as needed, wean as tolerated, maintain O2 sat at or above 90% --Consider pulmonology consult   Elevated troponin -likely demand ischemia in the setting of hypoxia.  Patient does not have EKGs or acute ischemic changes on EKG. CTA was negative for PE.   Hypertensive urgency -present on admission, continue amlodipine and as needed IV labetalol.   Resume home metoprolol (monitor for any increase wheezing) HCTZ and losartan listed on med history,  but pt says does not take these due to side effects.   Thyroid nodule -with history of multinodular goiter status post surgery in the past (unable to locate record).   CTA chest on 10/03/2020 showed multinodular thyroid goiter with apparent enlargement of the dominant nodule in the left thyroid lobe.  Recommendation for dedicated thyroid ultrasound for further evaluation.  --Thyroid u/s ordered    Pulmonary nodule -seen on CTA chest on 10/03/2020.  6 mm indeterminate right upper lobe pulmonary nodule.  Recommendation is noncontrast CT chest in 6 to 12 months.   Patient BMI: Body mass index is 24.64 kg/m.   DVT prophylaxis: enoxaparin (LOVENOX) injection 40 mg Start: 10/03/20 2200   Diet:  Diet Orders (From admission, onward)    Start     Ordered   10/04/20 1106  Diet regular Room service appropriate? Yes; Fluid consistency: Thin  Diet effective now       Question Answer Comment  Room service appropriate? Yes   Fluid consistency: Thin      10/04/20 1105            Code Status: Full Code    Subjective 10/06/20    Patient seen up in chair today.  She reports having constipation, states Dulcolax works well for her at home.  No abdominal pain nausea or vomiting.  She states her breathing is improving.  No fevers chills or chest pain.  No other acute complaints.  Inquires about when she will be able to go home.  Disposition Plan & Communication   Status is: Inpatient  Remains inpatient appropriate because:IV treatments appropriate due to intensity of illness or inability  to take PO.  Patient continues require supplemental oxygen and is on IV steroids as above.   Dispo: The patient is from: Home              Anticipated d/c is to: Home              Anticipated d/c date is: 1 to 2 days              Patient currently is not medically stable to d/c.   Family Communication: None at bedside, will attempt to call   Consults, Procedures, Significant Events   Consultants:    None  Procedures:   None  Antimicrobials:  Anti-infectives (From admission, onward)   Start     Dose/Rate Route Frequency Ordered Stop   10/05/20 1545  azithromycin (ZITHROMAX) tablet 500 mg        500 mg Oral Daily 10/05/20 1458 10/08/20 0959   10/03/20 2200  cefTRIAXone (ROCEPHIN) 1 g in sodium chloride 0.9 % 100 mL IVPB        1 g 200 mL/hr over 30 Minutes Intravenous Every 24 hours 10/03/20 2146 10/08/20 2144         Objective   Vitals:   10/06/20 0532 10/06/20 0833 10/06/20 1303 10/06/20 1604  BP: (!) 161/69 (!) 169/49 (!) 125/54 (!) 146/61  Pulse: 68 77 65 68  Resp: 17 20 20 20   Temp: 97.8 F (36.6 C) 98.6 F (37 C) 98.8 F (37.1 C) 98 F (36.7 C)  TempSrc: Oral Oral Oral Oral  SpO2: 98% 95% 94% 95%  Weight:      Height:        Intake/Output Summary (Last 24 hours) at 10/06/2020 1712 Last data filed at 10/06/2020 1406 Gross per 24 hour  Intake 879.84 ml  Output --  Net 879.84 ml   Filed Weights   10/03/20 1850 10/06/20 0033  Weight: 72.6 kg 77.9 kg    Physical Exam:  General exam: awake, alert, no acute distress Respiratory system: improved aeration, no wheezes, normal respiratory effort at rest, on 2 L/min nasal cannula oxygen. Cardiovascular system: normal S1/S2, RRR, no pedal edema.   Central nervous system: A&O x3. normal speech, grossly non-focal exam Psychiatry: normal mood, congruent affect, judgement and insight appear normal  Labs   Data Reviewed: I have personally reviewed following labs and imaging studies  CBC: Recent Labs  Lab 10/03/20 1847 10/04/20 0649  WBC 10.1 10.0  NEUTROABS 6.8  --   HGB 12.3 13.2  HCT 38.9 41.4  MCV 87.4 87.2  PLT 262 [id number]   Basic Metabolic Panel: Recent Labs  Lab 10/03/20 1847 10/04/20 0649  NA 143 141  K 3.8 4.0  CL 107 106  CO2 25 24  GLUCOSE 94 166*  BUN 13 10  CREATININE 0.78 0.70  CALCIUM 8.7* 9.6   GFR: Estimated Creatinine Clearance: 80.9 mL/min (by C-G formula based on SCr of 0.7  mg/dL). Liver Function Tests: Recent Labs  Lab 10/03/20 1847  AST 23  ALT 14  ALKPHOS 86  BILITOT 0.6  PROT 6.4*  ALBUMIN 3.4*   No results for input(s): LIPASE, AMYLASE in the last 168 hours. No results for input(s): AMMONIA in the last 168 hours. Coagulation Profile: No results for input(s): INR, PROTIME in the last 168 hours. Cardiac Enzymes: No results for input(s): CKTOTAL, CKMB, CKMBINDEX, TROPONINI in the last 168 hours. BNP (last 3 results) No results for input(s): PROBNP in the last 8760 hours. HbA1C: No results for  input(s): HGBA1C in the last 72 hours. CBG: No results for input(s): GLUCAP in the last 168 hours. Lipid Profile: No results for input(s): CHOL, HDL, LDLCALC, TRIG, CHOLHDL, LDLDIRECT in the last 72 hours. Thyroid Function Tests: No results for input(s): TSH, T4TOTAL, FREET4, T3FREE, THYROIDAB in the last 72 hours. Anemia Panel: No results for input(s): VITAMINB12, FOLATE, FERRITIN, TIBC, IRON, RETICCTPCT in the last 72 hours. Sepsis Labs: Recent Labs  Lab 10/03/20 1847  PROCALCITON <0.10    Recent Results (from the past 240 hour(s))  Resp Panel by RT-PCR (Flu A&B, Covid) Nasopharyngeal Swab     Status: None   Collection Time: 10/03/20  7:45 PM   Specimen: Nasopharyngeal Swab; Nasopharyngeal(NP) swabs in vial transport medium  Result Value Ref Range Status   SARS Coronavirus 2 by RT PCR NEGATIVE NEGATIVE Final    Comment: (NOTE) SARS-CoV-2 target nucleic acids are NOT DETECTED.  The SARS-CoV-2 RNA is generally detectable in upper respiratory specimens during the acute phase of infection. The lowest concentration of SARS-CoV-2 viral copies this assay can detect is 138 copies/mL. A negative result does not preclude SARS-Cov-2 infection and should not be used as the sole basis for treatment or other patient management decisions. A negative result may occur with  improper specimen collection/handling, submission of specimen other than  nasopharyngeal swab, presence of viral mutation(s) within the areas targeted by this assay, and inadequate number of viral copies(<138 copies/mL). A negative result must be combined with clinical observations, patient history, and epidemiological information. The expected result is Negative.  Fact Sheet for Patients:  EntrepreneurPulse.com.au  Fact Sheet for Healthcare Providers:  IncredibleEmployment.be  This test is no t yet approved or cleared by the [place] FDA and  has been authorized for detection and/or diagnosis of SARS-CoV-2 by FDA under an Emergency Use Authorization (EUA). This EUA will remain  in effect (meaning this test can be used) for the duration of the COVID-19 declaration under Section 564(b)(1) of the Act, 21 U.S.C.section 360bbb-3(b)(1), unless the authorization is terminated  or revoked sooner.       Influenza A by PCR NEGATIVE NEGATIVE Final   Influenza B by PCR NEGATIVE NEGATIVE Final    Comment: (NOTE) The Xpert Xpress SARS-CoV-2/FLU/RSV plus assay is intended as an aid in the diagnosis of influenza from Nasopharyngeal swab specimens and should not be used as a sole basis for treatment. Nasal washings and aspirates are unacceptable for Xpert Xpress SARS-CoV-2/FLU/RSV testing.  Fact Sheet for Patients: EntrepreneurPulse.com.au  Fact Sheet for Healthcare Providers: IncredibleEmployment.be  This test is not yet approved or cleared by the [place] FDA and has been authorized for detection and/or diagnosis of SARS-CoV-2 by FDA under an Emergency Use Authorization (EUA). This EUA will remain in effect (meaning this test can be used) for the duration of the COVID-19 declaration under Section 564(b)(1) of the Act, 21 U.S.C. section 360bbb-3(b)(1), unless the authorization is terminated or revoked.  Performed at [facility], [address], [place], [place]  [address]       Imaging Studies   No results found.   Medications   Scheduled Meds: . amLODipine  10 mg Oral Daily  . arformoterol  15 mcg Nebulization BID  . azithromycin  500 mg Oral Daily  . budesonide (PULMICORT) nebulizer solution  0.25 mg Nebulization BID  . dextromethorphan-guaiFENesin  1 tablet Oral BID  . enoxaparin (LOVENOX) injection  40 mg Subcutaneous Q24H  . ipratropium-albuterol  3 mL Nebulization TID  . methylPREDNISolone (SOLU-MEDROL) injection  60 mg Intravenous Q12H  . metoprolol tartrate  50 mg Oral BID  . [START ON 10/07/2020] predniSONE  40 mg Oral Q breakfast   Continuous Infusions: . cefTRIAXone (ROCEPHIN)  IV 1 g (10/05/20 2125)       LOS: 3 days    Time spent: 25 minutes with greater than 50% spent at bedside and coronation of care    Pennie Banter, DO [facility]  10/06/2020, 5:12 PM    If 7PM-7AM, please contact night-coverage. How to contact the [facility] Attending or Consulting provider 7A - 7P or covering provider during after hours 7P -7A, for this patient?    1. Check the care team in [facility] and look for a) attending/consulting TR[facility] provider listed and b) the [facility] team listed 2. Log into [email or web address] and use [facility] universal password to access. If you do not have the password, please contact the hospital operator. 3. Locate the [facility] provider you are looking for under [facility] and page to a number that you can be directly reached. 4. If you still have difficulty reaching the provider, please page the [facility] (Director on Call) for the Hospitalists listed on amion for assistance.

## 2020-10-06 NOTE — Evaluation (Signed)
Physical Therapy Evaluation Patient Details Name: Cindy Gutierrez MRN: [id number] DOB: [birth year] Today's Date: 10/06/2020   History of Present Illness  Pt is a 60 y.o. female presenting to hospital with acute onset SOB and cough.  Pt admitted with acute COPD exacerbation likely secondary acute bronchitis and subsequent acute hypoxic respiratory failure; elevated troponin likely secondary demand ischemia; and hypertensive urgency.  PMH includes COPD, htn, tobacco abuse, and h/o heart murmur.  Clinical Impression  Prior to hospital admission, pt was independent; no home O2 baseline; lives in 2nd floor apt (flight of stairs to enter with B railings).  Currently pt is modified independent semi-supine to sitting edge of bed; independent with transfers; and SBA with ambulation 200 feet (pt pushing O2 tank).  Mild to moderate SOB noted towards end of ambulation; O2 sats 95% or greater on 3 L O2 via nasal cannula during ambulation (pt on 2.5 L O2 at rest beginning/end of session).  Pt would benefit from skilled PT to address noted impairments and functional limitations (see below for any additional details).  Upon hospital discharge, anticipate no further PT needs.    Follow Up Recommendations No PT follow up    Equipment Recommendations  None recommended by PT    Recommendations for Other Services       Precautions / Restrictions Precautions Precautions: Fall Restrictions Weight Bearing Restrictions: No      Mobility  Bed Mobility Overal bed mobility: Modified Independent             General bed mobility comments: Semi-supine to sitting without any noted difficulties.    Transfers Overall transfer level: Independent Equipment used: None             General transfer comment: steady safe transfers noted (bed; toilet; recliner)  Ambulation/Gait Ambulation/Gait assistance: Supervision Gait Distance (Feet): 200 Feet Assistive device: None (pt pushing O2 tank) Gait  Pattern/deviations: WFL(Within Functional Limits) Gait velocity: mildly decreased   General Gait Details: steady  Information systems manager [employer] (Stroke Patients Only)       Balance Overall balance assessment: Needs assistance Sitting-balance support: No upper extremity supported;Feet supported Sitting balance-Leahy Scale: Normal Sitting balance - Comments: steady sitting reaching outside BOS   Standing balance support: No upper extremity supported;During functional activity Standing balance-Leahy Scale: Good Standing balance comment: no loss of balance with ambulation noted                             Pertinent Vitals/Pain Pain Assessment: No/denies pain  HR WFL during sessions activities.    Home Living Family/patient expects to be discharged to:: Private residence Living Arrangements: Children (3 sons) Available Help at Discharge: Family Type of Home: Apartment (2nd floor apt) Home Access: Stairs to enter Entrance Stairs-Rails: Right;Left;Can reach both Entrance Stairs-Number of Steps: 13-15 Home Layout: One level Home Equipment: None      Prior Function Level of Independence: Independent         Comments: Pt reports no falls in past 6 months     Hand Dominance        Extremity/Trunk Assessment   Upper Extremity Assessment Upper Extremity Assessment: Generalized weakness    Lower Extremity Assessment Lower Extremity Assessment: Generalized weakness    Cervical / Trunk Assessment Cervical / Trunk Assessment: Normal  Communication   Communication: No difficulties  Cognition Arousal/Alertness: Awake/alert Behavior During Therapy:  WFL for tasks assessed/performed Overall Cognitive Status: Within Functional Limits for tasks assessed                                        General Comments   Nursing cleared pt for participation in physical therapy.  Pt agreeable to PT session.     Exercises  Vc's for pursed lip breathing and pacing with activity.   Assessment/Plan    PT Assessment Patient needs continued PT services  PT Problem List Decreased strength;Decreased activity tolerance;Decreased mobility;Cardiopulmonary status limiting activity       PT Treatment Interventions DME instruction;Gait training;Stair training;Functional mobility training;Therapeutic activities;Therapeutic exercise;Balance training;Patient/family education    PT Goals (Current goals can be found in the Care Plan section)  Acute Rehab PT Goals Patient Stated Goal: to go home PT Goal Formulation: With patient Time For Goal Achievement: 10/20/20 Potential to Achieve Goals: Good    Frequency Min 2X/week   Barriers to discharge        Co-evaluation               AM-PAC PT "6 Clicks" Mobility  Outcome Measure Help needed turning from your back to your side while in a flat bed without using bedrails?: None Help needed moving from lying on your back to sitting on the side of a flat bed without using bedrails?: None Help needed moving to and from a bed to a chair (including a wheelchair)?: None Help needed standing up from a chair using your arms (e.g., wheelchair or bedside chair)?: None Help needed to walk in hospital room?: A Little Help needed climbing 3-5 steps with a railing? : A Little 6 Click Score: 22    End of Session Equipment Utilized During Treatment: Gait belt Activity Tolerance: Patient tolerated treatment well Patient left: in chair;with call bell/phone within reach;with chair alarm set;with nursing/sitter in room Nurse Communication: Mobility status;Precautions PT Visit Diagnosis: Other abnormalities of gait and mobility (R26.89);Muscle weakness (generalized) (M62.81)    Time: [id number] PT Time Calculation (min) (ACUTE ONLY): 22 min   Charges:   PT Evaluation $PT Eval Low Complexity: 1 Low PT Treatments $Therapeutic Exercise: 8-22 mins       Leitha Bleak, PT 10/06/20, 10:40 AM

## 2020-10-07 ENCOUNTER — Inpatient Hospital Stay: Payer: No Typology Code available for payment source

## 2020-10-07 LAB — BASIC METABOLIC PANEL
Anion gap: 11 (ref 5–15)
BUN: 25 mg/dL — ABNORMAL HIGH (ref 6–20)
CO2: 24 mmol/L (ref 22–32)
Calcium: 8.1 mg/dL — ABNORMAL LOW (ref 8.9–10.3)
Chloride: 103 mmol/L (ref 98–111)
Creatinine, Ser: 0.86 mg/dL (ref 0.44–1.00)
GFR, Estimated: >60 mL/min (ref >60)
Glucose, Bld: 272 mg/dL — ABNORMAL HIGH (ref 70–99)
Potassium: 3.2 mmol/L — ABNORMAL LOW (ref 3.5–5.1)
Sodium: 138 mmol/L (ref 135–145)

## 2020-10-07 LAB — CBC
HCT: 38 % (ref 36.0–46.0)
Hemoglobin: 12.2 g/dL (ref 12.0–15.0)
MCH: 27.8 pg (ref 26.0–34.0)
MCHC: 32.1 g/dL (ref 30.0–36.0)
MCV: 86.6 fL (ref 80.0–100.0)
Platelets: 263 10*3/uL (ref 150–400)
RBC: 4.39 MIL/uL (ref 3.87–5.11)
RDW: 13 % (ref 11.5–15.5)
WBC: 14.8 10*3/uL — ABNORMAL HIGH (ref 4.0–10.5)
nRBC: 0 % (ref 0.0–0.2)

## 2020-10-07 LAB — MAGNESIUM: Magnesium: 2 mg/dL (ref 1.7–2.4)

## 2020-10-07 MED ORDER — IPRATROPIUM-ALBUTEROL 0.5-2.5 (3) MG/3ML IN SOLN: 3.0000 mL | Freq: Three times a day (TID) | RESPIRATORY_TRACT | 0 refills | Status: DC

## 2020-10-07 MED ORDER — GUAIFENESIN-DM 100-10 MG/5ML PO SYRP: 5.0000 mL | ORAL_SOLUTION | ORAL | 0 refills | Status: DC | PRN

## 2020-10-07 MED ORDER — PREDNISONE 10 MG PO TABS: ORAL_TABLET | ORAL | 0 refills | Status: DC

## 2020-10-07 NOTE — Progress Notes (Addendum)
SATURATION QUALIFICATIONS: (This note is used to comply with regulatory documentation for home oxygen)  Patient Saturations on Room Air at Rest = 94%  Patient Saturations on Room Air while Ambulating = 90%  Recovers to 95 percent   MD notified via secure chat.

## 2020-10-07 NOTE — Progress Notes (Signed)
Patient's written discharge instructions reviewed with her including new medications and prescriptions.  Patient verbalized understanding and will be discharge to home.

## 2020-10-07 NOTE — Discharge Summary (Signed)
Physician Discharge Summary   Cindy Gutierrez  female DOB: Nov 11, 1960  [id number]  [facility]  Admit date: 10/03/2020 Discharge date: 10/07/2020  Admitted From: home Disposition:  home CODE STATUS: Full code  Discharge Instructions    Discharge instructions   Complete by: As directed    You have been treated for COPD flare up.  Please take prednisone taper as directed.  Please continue to take DuoNeb 3 times a day for the next 7 days, then as needed after that.  Please follow up with your [facility] doctor for prescriptions of your daily COPD inhalers.   Dr. Enzo Bi [facility] Course:  For full details, please see H&P, progress notes, consult notes and ancillary notes.  Briefly,  [name] a59 y.o.femalewith a known history of COPD and hypertension, presented to the ED on the evening of 10/03/2020 with progressively worsening shortness of breath, cough with difficulty producing sputum, and wheezing over the past couple days.  She reported intermittent similar symptoms over the past few weeks and had completed 2 courses of Z-Pak and prednisone as outpatient.  In the ED, patient was tachycardic and tachypneic with respiratory rate of 40, initially placed on BiPAP and since tapered down to 2 L/min nasal cannula oxygen.  Acute respiratory failure with hypoxia secondary to acute exacerbation of COPD with acute bronchitis  Pt was started on IV Solu-Medrol 60 mg BID and transitioned to Prednisone 40 mg daily.  Pt received scheduled DuoNeb and Pulmicort and Brovana nebs.  Pt also received empiric Rocephin and Zithromax while inpatient.  Prior to discharge, Patient Saturations on Room Air while Ambulating = 90%, so pt did not need supplemental O2 on discharge.  Pt was discharged on prednisone taper, and advised to follow up with her PCP at [facility] within a week after discharge, which is where pt receives her COPD inhalers and Rx.  Elevated troponin 2/2 demand  ischemia in the setting of hypoxia.  Trop peaked at 103.  patient did not have EKGs or acute ischemic changes on EKG. CTA was negative for PE.  Hypertensive urgency -present on admission continued amlodipine.  HCTZ and losartan listed on med history, but pt says does not take these due to side effects.  Thyroid nodules benign with history of multinodular goiter status post surgery in the past (unable to locate record).   CTA chest on 10/03/2020 showed multinodular thyroid goiter with apparent enlargement of the dominant nodule in the left thyroid lobe.  US thyroid ordered which read  "1. Multinodular goiter, relatively unchanged from 2014 comparison. 2. Each of the visualized thyroid nodules appear benign and do not meet criteria for dedicated ultrasound follow-up or tissue sampling.  Pulmonary nodule seen on CTA chest on 10/03/2020.  6 mm indeterminate right upper lobe pulmonary nodule.  Recommendation is noncontrast CT chest in 6 to 12 months.   Patient BMI: Body mass index is 24.64 kg/m.    Discharge Diagnoses:  Active Problems:   COPD exacerbation [facility])    Discharge Instructions:  Allergies as of 10/07/2020      Reactions   Sulfa Antibiotics Hives, Rash   Bee Pollen Hives   Lisinopril Nausea Only   Penicillins Hives      Medication List    STOP taking these medications   azithromycin 250 MG tablet Commonly known as: ZITHROMAX   metoprolol tartrate 100 MG tablet Commonly known as: LOPRESSOR     TAKE these medications   albuterol  108 (90 Base) MCG/ACT inhaler Commonly known as: VENTOLIN HFA Inhale 1-2 puffs into the lungs every 6 (six) hours as needed for wheezing or shortness of breath.   amLODipine 10 MG tablet Commonly known as: NORVASC Take 10 mg by mouth daily.   budesonide-formoterol 80-4.5 MCG/ACT inhaler Commonly known as: Symbicort Inhale 2 puffs into the lungs 2 (two) times daily.   Fluticasone-Salmeterol 250-50 MCG/DOSE Aepb Commonly known as:  ADVAIR Inhale 1 puff into the lungs 2 (two) times daily.   guaiFENesin-dextromethorphan 100-10 MG/5ML syrup Commonly known as: ROBITUSSIN DM Take 5 mLs by mouth every 4 (four) hours as needed for cough.   hydrochlorothiazide 25 MG tablet Commonly known as: HYDRODIURIL Take by mouth.   ipratropium-albuterol 0.5-2.5 (3) MG/3ML Soln Commonly known as: DUONEB Take 3 mLs by nebulization 3 (three) times daily for 7 days. Then as needed after. What changed:   when to take this  reasons to take this  additional instructions  Another medication with the same name was removed. Continue taking this medication, and follow the directions you see here.   losartan 100 MG tablet Commonly known as: COZAAR Take by mouth.   predniSONE 10 MG tablet Commonly known as: DELTASONE Take 40 mg (4 tablets) from 1/6 to 1/8, then 20 mg (2 tablets) from 1/9 to 1/12, then 10 mg (1 tablet) from 1/13 to 1/16, then done.   SPIRIVA RESPIMAT IN Inhale 1 puff into the lungs in the morning and at bedtime.        Follow-up Information    PCP at [facility] hospital. Schedule an appointment as soon as possible for a visit in 1 week(s).               Allergies  Allergen Reactions  . Sulfa Antibiotics Hives and Rash  . Bee Pollen Hives  . Lisinopril Nausea Only  . Penicillins Hives     The results of significant diagnostics from this hospitalization (including imaging, microbiology, ancillary and laboratory) are listed below for reference.   Consultations:   Procedures/Studies: DG Chest 1 View  Result Date: 10/03/2020 CLINICAL DATA:  Shortness of breath EXAM: CHEST  1 VIEW COMPARISON:  None. FINDINGS: The heart size and mediastinal contours are within normal limits. Both lungs are clear. The visualized skeletal structures are unremarkable. IMPRESSION: No active disease. Electronically Signed   By: Prudencio Pair M.D.   On: 10/03/2020 19:02   CT ANGIO CHEST PE W OR WO CONTRAST  Result Date:  10/04/2020 CLINICAL DATA:  Dyspnea, hypoxia, elevated serum troponin level EXAM: CT ANGIOGRAPHY CHEST WITH CONTRAST TECHNIQUE: Multidetector CT imaging of the chest was performed using the standard protocol during bolus administration of intravenous contrast. Multiplanar CT image reconstructions and MIPs were obtained to evaluate the vascular anatomy. CONTRAST:  134mL OMNIPAQUE IOHEXOL 350 MG/ML SOLN COMPARISON:  12/18/2017 FINDINGS: Cardiovascular: There is adequate opacification of the pulmonary arterial tree. No intraluminal filling defect identified to suggest acute pulmonary embolism. The central pulmonary arteries are enlarged in keeping with changes of pulmonary artery hypertension. Global cardiac size is within normal limits. No pericardial effusion. Mild atherosclerotic calcification within the thoracic aorta. No aortic aneurysm. Mediastinum/Nodes: Multinodular thyroid goiter is present with the dominant nodule measuring 3.4 cm in greatest dimension. This is appears enlarged when compared to prior sonogram of 09/23/2013, though is not well characterized on this examination. Numerous surgical clips inferior to the right thyroid lobe may reflect sequela of parathyroidectomy. No pathologic thoracic adenopathy. The esophagus is unremarkable. Lungs/Pleura: Moderate centrilobular  emphysema. 6 mm subpleural pulmonary nodule within the right apex, axial image # 25/6, is new from prior examination and indeterminate. Mild parenchymal scarring at the left lung base adjacent to remote rib fracture. The lungs are otherwise clear. No pneumothorax or pleural effusion. Central airways are widely patent. Upper Abdomen: No acute abnormality. Musculoskeletal: Multiple healed left rib fractures are noted. No acute bone abnormality. Review of the MIP images confirms the above findings. IMPRESSION: No pulmonary embolism. Morphologic changes in keeping with pulmonary arterial hypertension. Multinodular thyroid goiter with apparent  enlargement of the dominant nodule within the left thyroid lobe. Dedicated thyroid sonography is recommended for further evaluation. (Ref: J Am Coll Radiol. 2015 Feb;12(2): 143-50). 6 mm indeterminate right upper lobe pulmonary nodule Non-contrast chest CT at 6-12 months is recommended. If the nodule is stable at time of repeat CT, then future CT at 18-24 months (from today's scan) is considered optional for low-risk patients, but is recommended for high-risk patients. This recommendation follows the consensus statement: Guidelines for Management of Incidental Pulmonary Nodules Detected on CT Images: From the Fleischner Society 2017; Radiology 2017; 284:228-243. Moderate centrilobular emphysema Aortic Atherosclerosis (ICD10-I70.0) and Emphysema (ICD10-J43.9). Electronically Signed   By: Fidela Salisbury MD   On: 10/04/2020 00:12   DG Chest Portable 1 View  Result Date: 09/21/2020 CLINICAL DATA:  Shortness of breath. EXAM: PORTABLE CHEST 1 VIEW COMPARISON:  November 14, 2019 FINDINGS: The lungs are hyperinflated. Mild, diffuse, chronic appearing increased lung markings are seen. There is no evidence of acute infiltrate, pleural effusion or pneumothorax. Radiopaque surgical clips are seen overlying the medial aspect of the right apex. The heart size and mediastinal contours are within normal limits. Chronic sixth, seventh and eighth left rib fractures are seen. IMPRESSION: Chronic appearing increased lung markings without evidence of acute or active cardiopulmonary disease. Electronically Signed   By: Virgina Norfolk M.D.   On: 09/21/2020 19:08   US THYROID  Result Date: 10/07/2020 CLINICAL DATA:  Incidental on CT. EXAM: THYROID ULTRASOUND TECHNIQUE: Ultrasound examination of the thyroid gland and adjacent soft tissues was performed. COMPARISON:  09/23/2013 FINDINGS: Parenchymal Echotexture: Mildly heterogenous Isthmus: 1.3 cm, previously 1.0 cm Right lobe: 5.4 x 2.0 x 2.3 cm, previously 6.8 x 2.3 x 2.4 cm  Left lobe: 5.5 x 2.2 x 2.5 cm, previously 6.1 x 2.5 x 3.0 cm _________________________________________________________ Estimated total number of nodules >/= 1 cm: 5 Number of spongiform nodules >/=  2 cm not described below (TR1): 0 Number of mixed cystic and solid nodules >/= 1.5 cm not described below ([place]): 0 _________________________________________________________ Nodule # 1: Prior biopsy: No Location: Isthmus; Superior Maximum size: 1.5 cm; Other 2 dimensions: 1.0 x 1.0 cm, previously, 0.5 cm Composition: spongiform (0) Echogenicity: isoechoic (1) Shape: not taller-than-wide (0) Margins: smooth (0) Echogenic foci: none (0) ACR TI-RADS total points: 1. ACR TI-RADS risk category:  TR1 (0-1 points). Significant change in size (>/= 20% in two dimensions and minimal increase of 2 mm): Yes Change in features: Yes Change in ACR TI-RADS risk category: No ACR TI-RADS recommendations: This nodule does NOT meet TI-RADS criteria for biopsy or dedicated follow-up. _________________________________________________________ Nodule # 2: Prior biopsy: No Location: Isthmus; Inferior Maximum size: 1.2 cm; Other 2 dimensions: 1.1 x 1.0 cm, previously, 0.6 cm Composition: mixed cystic and solid (1) Echogenicity: isoechoic (1) Shape: not taller-than-wide (0) Margins: smooth (0) Echogenic foci: none (0) ACR TI-RADS total points: 2. ACR TI-RADS risk category:  TR2 (2 points). Significant change in size (>/= 20% in  two dimensions and minimal increase of 2 mm): Yes Change in features: Yes Change in ACR TI-RADS risk category: Yes ACR TI-RADS recommendations: This nodule does NOT meet TI-RADS criteria for biopsy or dedicated follow-up. _________________________________________________________ Nodule # 3: Location: Isthmus; Inferior Maximum size: 0.9 cm; Other 2 dimensions: 0.8 x 0.7 cm Composition: mixed cystic and solid (1) Echogenicity: isoechoic (1) Shape: not taller-than-wide (0) Margins: smooth (0) Echogenic foci: none (0) ACR  TI-RADS total points: 2. ACR TI-RADS risk category: TR2 (2 points). ACR TI-RADS recommendations: This nodule does NOT meet TI-RADS criteria for biopsy or dedicated follow-up. _________________________________________________________ Similar appearance of multiple right upper and right mid thyroid subcentimeter benign-appearing nodules. Nodule # 8: Prior biopsy: No Location: Right; Inferior Maximum size: 1.3 cm; Other 2 dimensions: 1.2 x 0.7 cm, previously, 0.7 cm Composition: solid/almost completely solid (2) Echogenicity: isoechoic (1) Shape: not taller-than-wide (0) Margins: smooth (0) Echogenic foci: none (0) ACR TI-RADS total points: 3. ACR TI-RADS risk category:  TR3 (3 points). Significant change in size (>/= 20% in two dimensions and minimal increase of 2 mm): Yes Change in features: Yes Change in ACR TI-RADS risk category: Yes ACR TI-RADS recommendations: Given size (<1.4 cm) and appearance, this nodule does NOT meet TI-RADS criteria for biopsy or dedicated follow-up. _________________________________________________________ Nodule # 9: Prior biopsy: No Location: Left; Superior Maximum size: 3.6 cm; Other 2 dimensions: 2.5 by 2.6 cm, previously, 3.5 cm Composition: spongiform (0) Echogenicity: isoechoic (1) Shape: not taller-than-wide (0) Margins: smooth (0) Echogenic foci: none (0) ACR TI-RADS total points: 1. ACR TI-RADS risk category:  TR1 (0-1 points). Significant change in size (>/= 20% in two dimensions and minimal increase of 2 mm): No Change in features: No Change in ACR TI-RADS risk category: No ACR TI-RADS recommendations: This nodule does NOT meet TI-RADS criteria for biopsy or dedicated follow-up. _________________________________________________________ Nodule # 10: Prior biopsy: No Location: Left; Inferior Maximum size: 1.4 cm; Other 2 dimensions: 1.4 x 1.2 cm, previously, 1.3 cm Composition: spongiform (0) Echogenicity: isoechoic (1) Shape: not taller-than-wide (0) Margins: smooth (0)  Echogenic foci: none (0) ACR TI-RADS total points: 1. ACR TI-RADS risk category:  TR1 (0-1 points). Significant change in size (>/= 20% in two dimensions and minimal increase of 2 mm): No Change in features: No Change in ACR TI-RADS risk category: No ACR TI-RADS recommendations: This nodule does NOT meet TI-RADS criteria for biopsy or dedicated follow-up. _________________________________________________________ IMPRESSION: 1. Multinodular goiter, relatively unchanged from 2014 comparison. 2. Each of the visualized thyroid nodules appear benign and do not meet criteria for dedicated ultrasound follow-up or tissue sampling. The above is in keeping with the ACR TI-RADS recommendations - J Am Coll Radiol 2017;14:587-595. Ruthann Cancer, MD [facility] Electronically Signed   By: Ruthann Cancer MD   On: 10/07/2020 11:12      Labs: BNP (last 3 results) Recent Labs    09/21/20 1825 10/03/20 1847  BNP 36.3 [id number]   Basic Metabolic Panel: Recent Labs  Lab 10/03/20 1847 10/04/20 0649  NA 143 141  K 3.8 4.0  CL 107 106  CO2 25 24  GLUCOSE 94 166*  BUN 13 10  CREATININE 0.78 0.70  CALCIUM 8.7* 9.6   Liver Function Tests: Recent Labs  Lab 10/03/20 1847  AST 23  ALT 14  ALKPHOS 86  BILITOT 0.6  PROT 6.4*  ALBUMIN 3.4*   No results for input(s): LIPASE, AMYLASE in the last 168 hours. No results for input(s): AMMONIA in the last  168 hours. CBC: Recent Labs  Lab 10/03/20 1847 10/04/20 0649  WBC 10.1 10.0  NEUTROABS 6.8  --   HGB 12.3 13.2  HCT 38.9 41.4  MCV 87.4 87.2  PLT 262 271   Cardiac Enzymes: No results for input(s): CKTOTAL, CKMB, CKMBINDEX, TROPONINI in the last 168 hours. BNP: Invalid input(s): POCBNP CBG: No results for input(s): GLUCAP in the last 168 hours. D-Dimer No results for input(s): DDIMER in the last 72 hours. Hgb A1c No results for input(s): HGBA1C in the last 72 hours. Lipid Profile No results  for input(s): CHOL, HDL, LDLCALC, TRIG, CHOLHDL, LDLDIRECT in the last 72 hours. Thyroid function studies No results for input(s): TSH, T4TOTAL, T3FREE, THYROIDAB in the last 72 hours.  Invalid input(s): FREET3 Anemia work up No results for input(s): VITAMINB12, FOLATE, FERRITIN, TIBC, IRON, RETICCTPCT in the last 72 hours. Urinalysis No results found for: COLORURINE, APPEARANCEUR, [place], [place], GLUCOSEU, [place], [place], [place], PROTEINUR, UROBILINOGEN, NITRITE, LEUKOCYTESUR Sepsis Labs Invalid input(s): PROCALCITONIN,  WBC,  LACTICIDVEN Microbiology Recent Results (from the past 240 hour(s))  Resp Panel by RT-PCR (Flu A&B, Covid) Nasopharyngeal Swab     Status: None   Collection Time: 10/03/20  7:45 PM   Specimen: Nasopharyngeal Swab; Nasopharyngeal(NP) swabs in vial transport medium  Result Value Ref Range Status   SARS Coronavirus 2 by RT PCR NEGATIVE NEGATIVE Final    Comment: (NOTE) SARS-CoV-2 target nucleic acids are NOT DETECTED.  The SARS-CoV-2 RNA is generally detectable in upper respiratory specimens during the acute phase of infection. The lowest concentration of SARS-CoV-2 viral copies this assay can detect is 138 copies/mL. A negative result does not preclude SARS-Cov-2 infection and should not be used as the sole basis for treatment or other patient management decisions. A negative result may occur with  improper specimen collection/handling, submission of specimen other than nasopharyngeal swab, presence of viral mutation(s) within the areas targeted by this assay, and inadequate number of viral copies(<138 copies/mL). A negative result must be combined with clinical observations, patient history, and epidemiological information. The expected result is Negative.  Fact Sheet for Patients:  EntrepreneurPulse.com.au  Fact Sheet for Healthcare Providers:  IncredibleEmployment.be  This test is no t yet approved or cleared  by the [place] FDA and  has been authorized for detection and/or diagnosis of SARS-CoV-2 by FDA under an Emergency Use Authorization (EUA). This EUA will remain  in effect (meaning this test can be used) for the duration of the COVID-19 declaration under Section 564(b)(1) of the Act, 21 U.S.C.section 360bbb-3(b)(1), unless the authorization is terminated  or revoked sooner.       Influenza A by PCR NEGATIVE NEGATIVE Final   Influenza B by PCR NEGATIVE NEGATIVE Final    Comment: (NOTE) The Xpert Xpress SARS-CoV-2/FLU/RSV plus assay is intended as an aid in the diagnosis of influenza from Nasopharyngeal swab specimens and should not be used as a sole basis for treatment. Nasal washings and aspirates are unacceptable for Xpert Xpress SARS-CoV-2/FLU/RSV testing.  Fact Sheet for Patients: EntrepreneurPulse.com.au  Fact Sheet for Healthcare Providers: IncredibleEmployment.be  This test is not yet approved or cleared by the [place] FDA and has been authorized for detection and/or diagnosis of SARS-CoV-2 by FDA under an Emergency Use Authorization (EUA). This EUA will remain in effect (meaning this test can be used) for the duration of the COVID-19 declaration under Section 564(b)(1) of the Act, 21 U.S.C. section 360bbb-3(b)(1), unless the authorization is terminated or revoked.  Performed at [facility], [phone]  [address] [place], [place] [address]      Total time spend on discharging this patient, including the last patient exam, discussing the hospital stay, instructions for ongoing care as it relates to all pertinent caregivers, as well as preparing the medical discharge records, prescriptions, and/or referrals as applicable, is 35 minutes.    Darlin Priestly, MD  [facility] 10/07/2020, 1:02 PM

## 2020-10-08 ENCOUNTER — Telehealth: Payer: Self-pay

## 2020-10-08 NOTE — Telephone Encounter (Signed)
Transition Care Management Follow-up Telephone Call  Date of discharge and from where: 10/07/20 [facility]  How have you been since you were released from the hospital? Doing well, just relaxing since being home.   Any questions or concerns? No  Items Reviewed:  Did the pt receive and understand the discharge instructions provided? No   Medications obtained and verified? No  Has not picked up prescriptions.   Other? No   Any new allergies since your discharge? No   Dietary orders reviewed? Yes  Do you have support at home? Yes    Functional Questionnaire: (I = Independent and D = Dependent) ADLs: I  Bathing/Dressing- I  Meal Prep- I  Eating- I  Maintaining continence- I  Transferring/Ambulation- I  Managing Meds- I  Follow up appointments reviewed:   PCP Hospital f/u appt confirmed? Yes  [facility].   Specialist Hospital f/u appt confirmed? No    Are transportation arrangements needed? No   If their condition worsens, is the pt aware to call PCP or go to the Emergency Dept.? Yes  Was the patient provided with contact information for the PCP's office or ED? Yes  Was to pt encouraged to call back with questions or concerns? Yes

## 2021-03-29 ENCOUNTER — Emergency Department: Payer: No Typology Code available for payment source

## 2021-03-29 ENCOUNTER — Other Ambulatory Visit: Payer: Self-pay

## 2021-03-29 ENCOUNTER — Emergency Department
Admission: EM | Admit: 2021-03-29 | Discharge: 2021-03-29 | Disposition: A | Discharge status: Home / Self Care | Payer: No Typology Code available for payment source | Attending: Emergency Medicine | Admitting: Emergency Medicine

## 2021-03-29 ENCOUNTER — Encounter: Payer: Self-pay | Admitting: Emergency Medicine

## 2021-03-29 DIAGNOSIS — Z7951 Long term (current) use of inhaled steroids: Secondary | ICD-10-CM | POA: Diagnosis not present

## 2021-03-29 DIAGNOSIS — Z79899 Other long term (current) drug therapy: Secondary | ICD-10-CM | POA: Insufficient documentation

## 2021-03-29 DIAGNOSIS — Z20822 Contact with and (suspected) exposure to covid-19: Secondary | ICD-10-CM | POA: Diagnosis not present

## 2021-03-29 DIAGNOSIS — I10 Essential (primary) hypertension: Secondary | ICD-10-CM | POA: Insufficient documentation

## 2021-03-29 DIAGNOSIS — J441 Chronic obstructive pulmonary disease with (acute) exacerbation: Secondary | ICD-10-CM

## 2021-03-29 DIAGNOSIS — R0602 Shortness of breath: Secondary | ICD-10-CM | POA: Diagnosis present

## 2021-03-29 DIAGNOSIS — F17210 Nicotine dependence, cigarettes, uncomplicated: Secondary | ICD-10-CM | POA: Diagnosis not present

## 2021-03-29 LAB — CBC WITH DIFFERENTIAL/PLATELET
Abs Immature Granulocytes: 0.03 10*3/uL (ref 0.00–0.07)
Basophils Absolute: 0.1 10*3/uL (ref 0.0–0.1)
Basophils Relative: 1 %
Eosinophils Absolute: 0.5 10*3/uL (ref 0.0–0.5)
Eosinophils Relative: 6 %
HCT: 40.2 % (ref 36.0–46.0)
Hemoglobin: 13.3 g/dL (ref 12.0–15.0)
Immature Granulocytes: 0 %
Lymphocytes Relative: 28 %
Lymphs Abs: 2.6 10*3/uL (ref 0.7–4.0)
MCH: 27.8 pg (ref 26.0–34.0)
MCHC: 33.1 g/dL (ref 30.0–36.0)
MCV: 83.9 fL (ref 80.0–100.0)
Monocytes Absolute: 0.9 10*3/uL (ref 0.1–1.0)
Monocytes Relative: 9 %
Neutro Abs: 5.4 10*3/uL (ref 1.7–7.7)
Neutrophils Relative %: 56 %
Platelets: 329 10*3/uL (ref 150–400)
RBC: 4.79 MIL/uL (ref 3.87–5.11)
RDW: 12.8 % (ref 11.5–15.5)
WBC: 9.6 10*3/uL (ref 4.0–10.5)
nRBC: 0 % (ref 0.0–0.2)

## 2021-03-29 LAB — COMPREHENSIVE METABOLIC PANEL
ALT: 26 U/L (ref 0–44)
AST: 28 U/L (ref 15–41)
Albumin: 4 g/dL (ref 3.5–5.0)
Alkaline Phosphatase: 119 U/L (ref 38–126)
Anion gap: 7 (ref 5–15)
BUN: 20 mg/dL (ref 8–23)
CO2: 27 mmol/L (ref 22–32)
Calcium: 8.9 mg/dL (ref 8.9–10.3)
Chloride: 105 mmol/L (ref 98–111)
Creatinine, Ser: 0.87 mg/dL (ref 0.44–1.00)
GFR, Estimated: >60 mL/min (ref >60)
Glucose, Bld: 97 mg/dL (ref 70–99)
Potassium: 3.8 mmol/L (ref 3.5–5.1)
Sodium: 139 mmol/L (ref 135–145)
Total Bilirubin: 0.6 mg/dL (ref 0.3–1.2)
Total Protein: 7.3 g/dL (ref 6.5–8.1)

## 2021-03-29 LAB — BLOOD GAS, VENOUS
Acid-Base Excess: 2.5 mmol/L — ABNORMAL HIGH (ref 0.0–2.0)
Bicarbonate: 27.9 mmol/L (ref 20.0–28.0)
O2 Saturation: 69 %
Patient temperature: 37
pCO2, Ven: 45 mmHg (ref 44.0–60.0)
pH, Ven: 7.4 (ref 7.250–7.430)
pO2, Ven: 36 mmHg (ref 32.0–45.0)

## 2021-03-29 LAB — TROPONIN I (HIGH SENSITIVITY): Troponin I (High Sensitivity): 6 ng/L (ref <18)

## 2021-03-29 LAB — PROCALCITONIN: Procalcitonin: <0.1 ng/mL

## 2021-03-29 LAB — RESP PANEL BY RT-PCR (FLU A&B, COVID) ARPGX2
Influenza A by PCR: NEGATIVE
Influenza B by PCR: NEGATIVE
SARS Coronavirus 2 by RT PCR: NEGATIVE

## 2021-03-29 MED ORDER — ONDANSETRON HCL 4 MG/2ML IJ SOLN
4.0000 mg | Freq: Once | INTRAMUSCULAR | Status: AC
  Administered 2021-03-29: 4 mg via INTRAVENOUS
  Filled 2021-03-29: qty 2

## 2021-03-29 MED ORDER — IPRATROPIUM-ALBUTEROL 0.5-2.5 (3) MG/3ML IN SOLN: 9.0000 mL | Freq: Once | RESPIRATORY_TRACT | Status: AC

## 2021-03-29 MED ORDER — PREDNISONE 10 MG PO TABS: 40.0000 mg | ORAL_TABLET | Freq: Every day | ORAL | 0 refills | Status: AC

## 2021-03-29 MED ORDER — IPRATROPIUM-ALBUTEROL 0.5-2.5 (3) MG/3ML IN SOLN: 3.0000 mL | Freq: Once | RESPIRATORY_TRACT | Status: DC

## 2021-03-29 MED ORDER — DOXYCYCLINE HYCLATE 100 MG PO CAPS: 100.0000 mg | ORAL_CAPSULE | Freq: Two times a day (BID) | ORAL | 0 refills | Status: DC

## 2021-03-29 MED ORDER — PREDNISONE 10 MG PO TABS: 40.0000 mg | ORAL_TABLET | Freq: Every day | ORAL | 0 refills | Status: DC

## 2021-03-29 MED ORDER — SODIUM CHLORIDE 0.9 % IV SOLN
500.0000 mg | Freq: Once | INTRAVENOUS | Status: AC
  Administered 2021-03-29: 22:00:00 500 mg via INTRAVENOUS
  Filled 2021-03-29: qty 500

## 2021-03-29 MED ORDER — IPRATROPIUM-ALBUTEROL 0.5-2.5 (3) MG/3ML IN SOLN
RESPIRATORY_TRACT | Status: AC
  Administered 2021-03-29: 22:00:00 9 mL via RESPIRATORY_TRACT
  Filled 2021-03-29: qty 9

## 2021-03-29 MED ORDER — AMLODIPINE BESYLATE 5 MG PO TABS
10.0000 mg | ORAL_TABLET | Freq: Every day | ORAL | Status: DC
  Administered 2021-03-29: 10 mg via ORAL
  Filled 2021-03-29: qty 2

## 2021-03-29 MED ORDER — METHYLPREDNISOLONE SODIUM SUCC 125 MG IJ SOLR
125.0000 mg | INTRAMUSCULAR | Status: AC
  Administered 2021-03-29: 22:00:00 125 mg via INTRAVENOUS
  Filled 2021-03-29: qty 2

## 2021-03-29 MED ORDER — DOXYCYCLINE HYCLATE 100 MG PO CAPS: 100.0000 mg | ORAL_CAPSULE | Freq: Two times a day (BID) | ORAL | 0 refills | Status: AC

## 2021-03-29 NOTE — ED Provider Notes (Signed)
[facility] Emergency Department Provider Note  ____________________________________________   Event Date/Time   First MD Initiated Contact with Patient 03/29/21 2127     (approximate)  I have reviewed the triage vital signs and the nursing notes.   HISTORY  Chief Complaint Shortness of Breath   HPI Cindy Gutierrez is a 60 y.o. female with a medical history of COPD and hypertension as well as ongoing tobacco abuse who presents for assessment approximately 2 weeks of worsening cough and shortness of breath.  Patient denies any acute chest pain, headache, sore throat, fevers, chills, nausea, vomiting, diarrhea, urinary symptoms, rash or recent falls or injuries.  No recent illicit drug use or EtOH use.  States he has been using inhalers but they have not helped much.  She think she is having exacerbation of her COPD.  She denies any other acute concerns at this time.  Also notes that she has been gaining some weight over the last couple days and her head is feeling very heavy.         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 Authorizing Provider  doxycycline (VIBRAMYCIN) 100  MG capsule Take 1 capsule (100 mg total) by mouth 2 (two) times daily for 7 days. 03/29/21 04/05/21 Yes Lucrezia Starch, MD  predniSONE (DELTASONE) 10 MG tablet Take 4 tablets (40 mg total) by mouth daily for 5 days. 03/29/21 04/03/21 Yes Lucrezia Starch, MD  albuterol (PROVENTIL HFA;VENTOLIN HFA) 108 (90 Base) MCG/ACT inhaler Inhale 1-2 puffs into the lungs every 6 (six) hours as needed for wheezing or shortness of breath. 06/26/16   Hagler, Jami L, PA-C  amLODipine (NORVASC) 10 MG tablet Take 10 mg by mouth daily. 03/09/17   [provider]  budesonide-formoterol (SYMBICORT) 80-4.5 MCG/ACT inhaler Inhale 2 puffs into the lungs 2 (two) times daily. 06/26/16 08/28/20  Hagler, Jami L, PA-C  Fluticasone-Salmeterol (ADVAIR) 250-50 MCG/DOSE AEPB Inhale 1 puff into the lungs 2 (two) times daily.    [provider]  guaiFENesin-dextromethorphan (ROBITUSSIN DM) 100-10 MG/5ML syrup Take 5 mLs by mouth every 4 (four) hours as needed for cough. 10/07/20   Enzo Bi, MD  hydrochlorothiazide (HYDRODIURIL) 25 MG tablet Take by mouth. 10/20/17 10/20/18  [provider]  ipratropium-albuterol (DUONEB) 0.5-2.5 (3) MG/3ML SOLN Take 3 mLs by nebulization 3 (three) times daily for 7 days. Then as needed after. 10/07/20 10/14/20  Enzo Bi, MD  losartan (COZAAR) 100 MG tablet Take by mouth. Patient not taking: No sig reported 05/23/17   [provider]  Tiotropium Bromide Monohydrate (SPIRIVA RESPIMAT IN) Inhale 1 puff into the lungs in the morning and at bedtime.    [provider]  Allergies Sulfa antibiotics, Bee pollen, Lisinopril, and Penicillins  No family history on file.  Social History Social History   Tobacco Use   Smoking status: Every Day    Packs/day: 0.50    Pack years: 0.00    Types: Cigarettes   Smokeless tobacco: Never  Vaping Use   Vaping Use: Never used  Substance Use Topics   Alcohol use: Yes   Drug use: No    Review of Systems  Review of Systems   Constitutional:  Positive for malaise/fatigue. Negative for chills and fever.  HENT:  Negative for sore throat.   Eyes:  Negative for pain.  Respiratory:  Positive for cough and shortness of breath. Negative for stridor.   Cardiovascular:  Negative for chest pain.  Gastrointestinal:  Negative for vomiting.  Genitourinary:  Negative for dysuria.  Musculoskeletal:  Negative for myalgias.  Skin:  Negative for rash.  Neurological:  Negative for seizures, loss of consciousness and headaches.  Psychiatric/Behavioral:  Negative for suicidal ideas.   All other systems reviewed and are negative.    ____________________________________________   PHYSICAL EXAM:  VITAL SIGNS: ED Triage Vitals [03/29/21 2123]  Enc Vitals Group     BP (!) 179/83     Pulse Rate 87     Resp 18     Temp 98.5 F (36.9 C)     Temp Source Oral     SpO2 95 %     Weight      Height      Head Circumference      Peak Flow      Pain Score      Pain Loc      Pain Edu?      Excl. in [place]?    Vitals:   03/29/21 2123 03/29/21 2209  BP: (!) 179/83 (!) 160/65  Pulse: 87   Resp: 18   Temp: 98.5 F (36.9 C)   SpO2: 95%    Physical Exam Vitals and nursing note reviewed.  Constitutional:      General: She is not in acute distress.    Appearance: She is well-developed. She is ill-appearing.  HENT:     Head: Normocephalic and atraumatic.     Right Ear: External ear normal.     Left Ear: External ear normal.     Nose: Nose normal.     Mouth/Throat:     Mouth: Mucous membranes are moist.  Eyes:     Conjunctiva/sclera: Conjunctivae normal.  Cardiovascular:     Rate and Rhythm: Normal rate and regular rhythm.     Heart sounds: No murmur heard. Pulmonary:     Effort: Tachypnea and respiratory distress present.     Breath sounds: Decreased breath sounds and wheezing present.  Abdominal:     Palpations: Abdomen is soft.     Tenderness: There is no abdominal tenderness.  Musculoskeletal:     Cervical back:  Neck supple.     Right lower leg: Edema present.     Left lower leg: Edema present.  Skin:    General: Skin is warm and dry.     Capillary Refill: Capillary refill takes less than 2 seconds.  Neurological:     General: No focal deficit present.     Mental Status: She is alert.  Psychiatric:        Mood and Affect: Mood normal.     ____________________________________________   LABS (all labs ordered are listed, but only abnormal results are displayed)  Labs Reviewed  BLOOD GAS, VENOUS - Abnormal; Notable for the following components:      Result Value   Acid-Base Excess 2.5 (*)    All other components within normal limits  RESP PANEL BY RT-PCR (FLU A&B, COVID) ARPGX2  CBC WITH DIFFERENTIAL/PLATELET  COMPREHENSIVE METABOLIC PANEL  PROCALCITONIN  TROPONIN I (HIGH SENSITIVITY)   ____________________________________________  EKG  Sinus rhythm with a ventricular of 82 and some artifact but no clear evidence of acute ischemia.  Left anterior fascicle block and QTC of 523. ____________________________________________  RADIOLOGY  ED MD interpretation: Chest x-ray remarkable for some hyperinflation without evidence of pneumothorax, overt edema, focal consolidation or other clear acute intrathoracic process.  Official radiology report(s): DG Chest Portable 1 View  Result Date: 03/29/2021 CLINICAL DATA:  Dyspnea EXAM: PORTABLE CHEST 1 VIEW COMPARISON:  10/03/2020 FINDINGS: Hyperinflation with emphysema and bronchitic changes. No consolidation, pleural effusion, or pneumothorax. IMPRESSION: Hyperinflation with emphysematous disease and mild bronchitic changes. Electronically Signed   By: Donavan Foil M.D.   On: 03/29/2021 21:40    ____________________________________________   PROCEDURES  Procedure(s) performed (including Critical Care):  .1-3 Lead EKG Interpretation  Date/Time: 03/29/2021 10:43 PM Performed by: Lucrezia Starch, MD Authorized by: Lucrezia Starch, MD      Interpretation: normal     ECG rate assessment: normal     Ectopy: none     Conduction: normal     ____________________________________________   INITIAL IMPRESSION / ASSESSMENT AND PLAN / ED COURSE      Patient presents with above-stated history exam presents with approximate 2 weeks of worsening cough shortness of breath unresponsive to inhalers with underlying gnosis of COPD and ongoing tobacco abuse.  On arrival patient is hypertensive with BP of 179/83 and tachypneic on my exam with respiratory rate in the mid 20s with an SPO2 of 95% on room air.  On exam patient has diminished breath sounds and wheezing bilaterally.  Impression is mild COPD exacerbation.  Chest x-ray has no evidence of focal consolidation to suggest pneumonia and patient has no fever or leukocytosis to suggest acute infectious process.  Additional patient has a little bit of edema in her lower extremities she has no evidence of edema on her chest x-ray denies any history of heart failure and states she has edema in her legs chronically secondary to high blood pressure.  VBG with a pH of 7.4 and PCO2 of 45 and bicarb of 27.9 has no evidence of hypercarbic respiratory failure.  Troponin of 6 with otherwise reassuring EKG and patient denying any chest pain is not suggestive of ACS at this time.  CMP shows no significant electrode or metabolic derangements.  CBC shows no leukocytosis or acute anemia.  After receiving azithromycin plain Medrol and DuoNeb therapy patient states he felt much better on reassessment had improvement in her wheezing and respiratory rate.  Given improvement in symptoms without evidence of hypoxic hypercarbic respiratory failure no history or exam work-up I think patient stable for discharge with close outpatient follow-up.  Rx written for Doxy and prednisone.  Discharged stable condition.  Strict return precautions advised and discussed.    ____________________________________________   FINAL  CLINICAL IMPRESSION(S) / ED DIAGNOSES  Final diagnoses:  COPD exacerbation ([place])    Medications  azithromycin (ZITHROMAX) 500 mg in sodium chloride 0.9 % 250 mL IVPB (500 mg Intravenous New Bag/Given 03/29/21 2206)  amLODipine (NORVASC) tablet 10 mg (10 mg Oral Given 03/29/21 2209)  ondansetron (ZOFRAN) injection 4 mg (has no administration in time  range)  methylPREDNISolone sodium succinate (SOLU-MEDROL) 125 mg/2 mL injection 125 mg (125 mg Intravenous Given 03/29/21 2202)  ipratropium-albuterol (DUONEB) 0.5-2.5 (3) MG/3ML nebulizer solution 9 mL (9 mLs Nebulization Given 03/29/21 2153)     ED Discharge Orders          Ordered    predniSONE (DELTASONE) 10 MG tablet  Daily        03/29/21 2236    doxycycline (VIBRAMYCIN) 100 MG capsule  2 times daily        03/29/21 2236             Note:  This document was prepared using Dragon voice recognition software and may include unintentional dictation errors.    Lucrezia Starch, MD 03/29/21 [phone]

## 2021-03-29 NOTE — ED Provider Notes (Signed)
Emergency Medicine Provider Triage Evaluation Note  Cindy Gutierrez , a 60 y.o. female  was evaluated at triage. Other MD Creig Hines) in with critical patient, thus seeing to initiate treatments.  Increasing shortness of breath and "COPD" per patient.  Reports she has had this happen her multiple times she needs to be treated with breathing medicine, prednisone, and antibiotic is how she typically gets treated.  She reports she has a long history of COPD and is having another attack of  Review of Systems  Positive: Shortness of breath, cough, worsening for 2 weeks and wheezing. Negative: No pain.  No chest pain.  No fever.  Physical Exam  BP (!) 179/83 (BP Location: Left Arm)   Pulse 87   Temp 98.5 F (36.9 C) (Oral)   Resp 18   Ht 5\' 10"  (1.778 m)   Wt 79.4 kg   SpO2 95%   BMI 25.11 kg/m  Gen:   Awake, moderate increased work of breathing.  Sitting up.  No tripoding or evidence of severe distress or extremis though.  Able to speak in phrases.  Able to hold nebulizer to her mouth and compliant with treatments.  No hypoxia.  Normal oxygen saturation. Resp:  Moderate end expiratory wheezing.  Respiratory rate approximately 20 mild accessory muscle use.  Sitting up.  No focal crackles or rales. MSK:   Moves extremities without difficulty  Other:  Fully alert.  Oriented.  Pleasant and conversant.  Medical Decision Making  Medically screening exam initiated at 9:34 PM.  Appropriate orders placed.  ODESSER TOURANGEAU was informed that the remainder of the evaluation will be completed by another provider, this initial triage assessment does not replace that evaluation, and the importance of remaining in the ED until their evaluation is complete.  And Dr. Hulan Saas given signout at 9:30 PM.  He will be following up to evaluate her shortly, just finished care for critical patient.   Cindy Kitten, MD 03/29/21 2136

## 2021-03-29 NOTE — ED Triage Notes (Signed)
Patient arrived for SOB worsening since Saturday. Pt has hx of COPD.

## 2021-03-30 ENCOUNTER — Telehealth: Payer: Self-pay | Admitting: *Deleted

## 2021-03-30 NOTE — Telephone Encounter (Signed)
Transition Care Management Follow-up Telephone Call Date of discharge and from where: 03/29/2021 [facility] ED How have you been since you were released from the hospital? "I am okay" Any questions or concerns? No  Items Reviewed: Did the pt receive and understand the discharge instructions provided? Yes  Medications obtained and verified? Yes  Other? No  Any new allergies since your discharge? No  Dietary orders reviewed? No Do you have support at home? Yes    Functional Questionnaire: (I = Independent and D = Dependent) ADLs: I  Bathing/Dressing- I  Meal Prep- I  Eating- I  Maintaining continence- I  Transferring/Ambulation- I  Managing Meds- I  Follow up appointments reviewed:  PCP Hospital f/u appt confirmed? No   Specialist Hospital f/u appt confirmed? No   Are transportation arrangements needed? No  If their condition worsens, is the pt aware to call PCP or go to the Emergency Dept.? Yes Was the patient provided with contact information for the PCP's office or ED? Yes Was to pt encouraged to call back with questions or concerns? Yes

## 2021-04-02 DIAGNOSIS — B1920 Unspecified viral hepatitis C without hepatic coma: Secondary | ICD-10-CM | POA: Insufficient documentation

## 2021-05-19 ENCOUNTER — Ambulatory Visit (INDEPENDENT_AMBULATORY_CARE_PROVIDER_SITE_OTHER): Payer: No Typology Code available for payment source | Admitting: Podiatry

## 2021-05-19 ENCOUNTER — Other Ambulatory Visit: Payer: Self-pay

## 2021-05-19 DIAGNOSIS — B351 Tinea unguium: Secondary | ICD-10-CM

## 2021-05-19 DIAGNOSIS — F32A Depression, unspecified: Secondary | ICD-10-CM | POA: Insufficient documentation

## 2021-05-19 DIAGNOSIS — E210 Primary hyperparathyroidism: Secondary | ICD-10-CM | POA: Insufficient documentation

## 2021-05-19 DIAGNOSIS — M79676 Pain in unspecified toe(s): Secondary | ICD-10-CM

## 2021-05-19 DIAGNOSIS — I351 Nonrheumatic aortic (valve) insufficiency: Secondary | ICD-10-CM | POA: Insufficient documentation

## 2021-05-19 DIAGNOSIS — F17210 Nicotine dependence, cigarettes, uncomplicated: Secondary | ICD-10-CM | POA: Insufficient documentation

## 2021-05-19 DIAGNOSIS — I890 Lymphedema, not elsewhere classified: Secondary | ICD-10-CM | POA: Insufficient documentation

## 2021-05-19 DIAGNOSIS — R0789 Other chest pain: Secondary | ICD-10-CM | POA: Insufficient documentation

## 2021-05-19 DIAGNOSIS — R911 Solitary pulmonary nodule: Secondary | ICD-10-CM | POA: Insufficient documentation

## 2021-05-19 DIAGNOSIS — F321 Major depressive disorder, single episode, moderate: Secondary | ICD-10-CM | POA: Insufficient documentation

## 2021-05-19 NOTE — Progress Notes (Signed)
  Subjective:  Patient ID: Cindy Gutierrez, female    DOB: 1961-09-23,  MRN: [id number] HPI Chief Complaint  Patient presents with   Nail Problem    Toenails bilateral - unable to trim toenails herself due to thickness, tender 1st bilateral   New Patient (Initial Visit)    60 y.o. female presents with the above complaint.   ROS: Denies fever chills nausea vomiting muscle aches pains calf pain back pain chest pain shortness of breath.  Past Medical History:  Diagnosis Date   COPD (chronic obstructive pulmonary disease) ([place])    Hypertension    Past Surgical History:  Procedure Laterality Date   COLONOSCOPY WITH PROPOFOL N/A 08/21/2018   Procedure: COLONOSCOPY WITH PROPOFOL;  Surgeon: Lin Landsman, MD;  [facility]: [facility] ENDOSCOPY;  Service: Gastroenterology;  Laterality: N/A;   TUBAL LIGATION      Current Outpatient Medications:    FUROSEMIDE PO, Take by mouth., Disp: , Rfl:    albuterol (PROVENTIL HFA;VENTOLIN HFA) 108 (90 Base) MCG/ACT inhaler, Inhale 1-2 puffs into the lungs every 6 (six) hours as needed for wheezing or shortness of breath., Disp: 1 Inhaler, Rfl: 0   Fluticasone-Salmeterol (ADVAIR) 250-50 MCG/DOSE AEPB, Inhale 1 puff into the lungs 2 (two) times daily., Disp: , Rfl:    Tiotropium Bromide Monohydrate (SPIRIVA RESPIMAT IN), Inhale 1 puff into the lungs in the morning and at bedtime., Disp: , Rfl:   Allergies  Allergen Reactions   Sulfa Antibiotics Hives and Rash   Bee Pollen Hives   Lisinopril Nausea Only   Losartan    Penicillins Hives   Tiotropium Bromide Monohydrate     Other reaction(s): Cough   Review of Systems Objective:  There were no vitals filed for this visit.  General: Well developed, nourished, in no acute distress, alert and oriented x3   Dermatological: Skin is warm, dry and supple bilateral. Nails x 10 are long thick yellow dystrophic clinically mycotic remaining integument appears unremarkable at this time. There are no open sores, no  preulcerative lesions, no rash or signs of infection present.  Multiple porokeratotic lesions plantar aspect forefoot bilateral  Vascular: Dorsalis Pedis artery and Posterior Tibial artery pedal pulses are 2/4 bilateral with immedate capillary fill time. Pedal hair growth present. No varicosities and no lower extremity edema present bilateral.   Neruologic: Grossly intact via light touch bilateral. Vibratory intact via tuning fork bilateral. Protective threshold with Semmes Wienstein monofilament intact to all pedal sites bilateral. Patellar and Achilles deep tendon reflexes 2+ bilateral. No Babinski or clonus noted bilateral.   Musculoskeletal: No gross boney pedal deformities bilateral. No pain, crepitus, or limitation noted with foot and ankle range of motion bilateral. Muscular strength 5/5 in all groups tested bilateral.  Gait: Unassisted, Nonantalgic.    Radiographs:  None taken  Assessment & Plan:   Assessment: Pain limb secondary to onychomycosis and benign skin lesions.  Plan: Debrided benign skin lesions.  And debris toenails 1 through 5 bilateral.     Annette Bertelson T. [place], [place]

## 2021-05-23 ENCOUNTER — Encounter: Payer: Self-pay | Admitting: Internal Medicine

## 2021-05-23 ENCOUNTER — Inpatient Hospital Stay
Admission: EM | Admit: 2021-05-23 | Discharge: 2021-05-25 | DRG: 190 | Disposition: A | Discharge status: Home / Self Care | Payer: No Typology Code available for payment source | Attending: Obstetrics and Gynecology | Admitting: Obstetrics and Gynecology

## 2021-05-23 ENCOUNTER — Other Ambulatory Visit: Payer: Self-pay

## 2021-05-23 ENCOUNTER — Emergency Department: Payer: No Typology Code available for payment source

## 2021-05-23 DIAGNOSIS — J9621 Acute and chronic respiratory failure with hypoxia: Secondary | ICD-10-CM | POA: Diagnosis present

## 2021-05-23 DIAGNOSIS — I350 Nonrheumatic aortic (valve) stenosis: Secondary | ICD-10-CM | POA: Diagnosis present

## 2021-05-23 DIAGNOSIS — Z79899 Other long term (current) drug therapy: Secondary | ICD-10-CM | POA: Diagnosis not present

## 2021-05-23 DIAGNOSIS — R911 Solitary pulmonary nodule: Secondary | ICD-10-CM | POA: Diagnosis present

## 2021-05-23 DIAGNOSIS — I10 Essential (primary) hypertension: Secondary | ICD-10-CM | POA: Diagnosis present

## 2021-05-23 DIAGNOSIS — Z9851 Tubal ligation status: Secondary | ICD-10-CM

## 2021-05-23 DIAGNOSIS — Z882 Allergy status to sulfonamides status: Secondary | ICD-10-CM | POA: Diagnosis not present

## 2021-05-23 DIAGNOSIS — B1920 Unspecified viral hepatitis C without hepatic coma: Secondary | ICD-10-CM | POA: Diagnosis present

## 2021-05-23 DIAGNOSIS — Z91030 Bee allergy status: Secondary | ICD-10-CM | POA: Diagnosis not present

## 2021-05-23 DIAGNOSIS — E210 Primary hyperparathyroidism: Secondary | ICD-10-CM | POA: Diagnosis present

## 2021-05-23 DIAGNOSIS — Z20822 Contact with and (suspected) exposure to covid-19: Secondary | ICD-10-CM | POA: Diagnosis present

## 2021-05-23 DIAGNOSIS — J9601 Acute respiratory failure with hypoxia: Secondary | ICD-10-CM | POA: Diagnosis present

## 2021-05-23 DIAGNOSIS — Z880 Allergy status to penicillin: Secondary | ICD-10-CM | POA: Diagnosis not present

## 2021-05-23 DIAGNOSIS — F17210 Nicotine dependence, cigarettes, uncomplicated: Secondary | ICD-10-CM | POA: Diagnosis present

## 2021-05-23 DIAGNOSIS — R0603 Acute respiratory distress: Secondary | ICD-10-CM | POA: Diagnosis present

## 2021-05-23 DIAGNOSIS — J449 Chronic obstructive pulmonary disease, unspecified: Secondary | ICD-10-CM | POA: Diagnosis present

## 2021-05-23 DIAGNOSIS — F32A Depression, unspecified: Secondary | ICD-10-CM | POA: Diagnosis present

## 2021-05-23 DIAGNOSIS — Z720 Tobacco use: Secondary | ICD-10-CM | POA: Diagnosis not present

## 2021-05-23 DIAGNOSIS — J441 Chronic obstructive pulmonary disease with (acute) exacerbation: Secondary | ICD-10-CM | POA: Diagnosis present

## 2021-05-23 DIAGNOSIS — Z888 Allergy status to other drugs, medicaments and biological substances status: Secondary | ICD-10-CM

## 2021-05-23 DIAGNOSIS — Z8249 Family history of ischemic heart disease and other diseases of the circulatory system: Secondary | ICD-10-CM | POA: Diagnosis not present

## 2021-05-23 LAB — BASIC METABOLIC PANEL
Anion gap: 5 (ref 5–15)
BUN: 12 mg/dL (ref 8–23)
CO2: 26 mmol/L (ref 22–32)
Calcium: 8.7 mg/dL — ABNORMAL LOW (ref 8.9–10.3)
Chloride: 110 mmol/L (ref 98–111)
Creatinine, Ser: 0.77 mg/dL (ref 0.44–1.00)
GFR, Estimated: >60 mL/min (ref >60)
Glucose, Bld: 106 mg/dL — ABNORMAL HIGH (ref 70–99)
Potassium: 3.9 mmol/L (ref 3.5–5.1)
Sodium: 141 mmol/L (ref 135–145)

## 2021-05-23 LAB — CBC
HCT: 43.1 % (ref 36.0–46.0)
Hemoglobin: 14.1 g/dL (ref 12.0–15.0)
MCH: 28.5 pg (ref 26.0–34.0)
MCHC: 32.7 g/dL (ref 30.0–36.0)
MCV: 87.2 fL (ref 80.0–100.0)
Platelets: 228 10*3/uL (ref 150–400)
RBC: 4.94 MIL/uL (ref 3.87–5.11)
RDW: 12.8 % (ref 11.5–15.5)
WBC: 7.3 10*3/uL (ref 4.0–10.5)
nRBC: 0 % (ref 0.0–0.2)

## 2021-05-23 LAB — RESP PANEL BY RT-PCR (FLU A&B, COVID) ARPGX2
Influenza A by PCR: NEGATIVE
Influenza B by PCR: NEGATIVE
SARS Coronavirus 2 by RT PCR: NEGATIVE

## 2021-05-23 LAB — BLOOD GAS, ARTERIAL
Acid-base deficit: 0.2 mmol/L (ref 0.0–2.0)
Bicarbonate: 22.6 mmol/L (ref 20.0–28.0)
O2 Saturation: 99.2 %
Patient temperature: 37
pCO2 arterial: 31 mmHg — ABNORMAL LOW (ref 32.0–48.0)
pH, Arterial: 7.47 — ABNORMAL HIGH (ref 7.350–7.450)
pO2, Arterial: 133 mmHg — ABNORMAL HIGH (ref 83.0–108.0)

## 2021-05-23 LAB — TSH: TSH: 0.417 u[IU]/mL (ref 0.350–4.500)

## 2021-05-23 LAB — MAGNESIUM: Magnesium: 1.6 mg/dL — ABNORMAL LOW (ref 1.7–2.4)

## 2021-05-23 LAB — PROCALCITONIN: Procalcitonin: <0.1 ng/mL

## 2021-05-23 LAB — BRAIN NATRIURETIC PEPTIDE: B Natriuretic Peptide: 60.9 pg/mL (ref 0.0–100.0)

## 2021-05-23 LAB — TROPONIN I (HIGH SENSITIVITY)
Troponin I (High Sensitivity): 6 ng/L (ref <18)
Troponin I (High Sensitivity): 7 ng/L (ref <18)

## 2021-05-23 LAB — T4, FREE: Free T4: 1.18 ng/dL — ABNORMAL HIGH (ref 0.61–1.12)

## 2021-05-23 LAB — D-DIMER, QUANTITATIVE: D-Dimer, Quant: 0.31 ug/mL-FEU (ref 0.00–0.50)

## 2021-05-23 MED ORDER — SODIUM CHLORIDE 0.9% FLUSH: 3.0000 mL | INTRAVENOUS | Status: DC | PRN

## 2021-05-23 MED ORDER — ASPIRIN EC 81 MG PO TBEC
81.0000 mg | DELAYED_RELEASE_TABLET | Freq: Every day | ORAL | Status: DC
  Administered 2021-05-23 – 2021-05-25 (×3): 81 mg via ORAL
  Filled 2021-05-23 (×3): qty 1

## 2021-05-23 MED ORDER — METHYLPREDNISOLONE SODIUM SUCC 125 MG IJ SOLR
80.0000 mg | Freq: Two times a day (BID) | INTRAMUSCULAR | Status: DC
  Administered 2021-05-23 – 2021-05-24 (×2): 80 mg via INTRAVENOUS
  Filled 2021-05-23 (×2): qty 2

## 2021-05-23 MED ORDER — SODIUM CHLORIDE 0.9 % IV SOLN: 250.0000 mL | INTRAVENOUS | Status: DC | PRN

## 2021-05-23 MED ORDER — METHYLPREDNISOLONE SODIUM SUCC 125 MG IJ SOLR
125.0000 mg | Freq: Once | INTRAMUSCULAR | Status: AC
  Administered 2021-05-23: 125 mg via INTRAVENOUS
  Filled 2021-05-23: qty 2

## 2021-05-23 MED ORDER — IPRATROPIUM-ALBUTEROL 0.5-2.5 (3) MG/3ML IN SOLN
3.0000 mL | Freq: Once | RESPIRATORY_TRACT | Status: AC
  Administered 2021-05-23: 3 mL via RESPIRATORY_TRACT
  Filled 2021-05-23: qty 3

## 2021-05-23 MED ORDER — FLUTICASONE FUROATE-VILANTEROL 200-25 MCG/INH IN AEPB: 1.0000 | INHALATION_SPRAY | Freq: Every day | RESPIRATORY_TRACT | Status: DC

## 2021-05-23 MED ORDER — HEPARIN SODIUM (PORCINE) 5000 UNIT/ML IJ SOLN: 5000.0000 [IU] | Freq: Three times a day (TID) | INTRAMUSCULAR | Status: DC

## 2021-05-23 MED ORDER — SODIUM CHLORIDE 0.9% FLUSH
3.0000 mL | Freq: Two times a day (BID) | INTRAVENOUS | Status: DC
  Administered 2021-05-23 – 2021-05-24 (×2): 3 mL via INTRAVENOUS

## 2021-05-23 MED ORDER — FUROSEMIDE 10 MG/ML IJ SOLN
20.0000 mg | Freq: Two times a day (BID) | INTRAMUSCULAR | Status: AC
  Administered 2021-05-23 – 2021-05-24 (×2): 20 mg via INTRAVENOUS
  Filled 2021-05-23 (×2): qty 4

## 2021-05-23 MED ORDER — HYDRALAZINE HCL 20 MG/ML IJ SOLN: 10.0000 mg | Freq: Three times a day (TID) | INTRAMUSCULAR | Status: DC | PRN

## 2021-05-23 MED ORDER — MAGNESIUM SULFATE IN D5W 1-5 GM/100ML-% IV SOLN
1.0000 g | Freq: Once | INTRAVENOUS | Status: AC
  Administered 2021-05-23: 1 g via INTRAVENOUS
  Filled 2021-05-23: qty 100

## 2021-05-23 MED ORDER — ALBUTEROL SULFATE HFA 108 (90 BASE) MCG/ACT IN AERS: 1.0000 | INHALATION_SPRAY | Freq: Four times a day (QID) | RESPIRATORY_TRACT | Status: DC | PRN

## 2021-05-23 MED ORDER — ALBUTEROL SULFATE (2.5 MG/3ML) 0.083% IN NEBU
2.5000 mg | INHALATION_SOLUTION | Freq: Four times a day (QID) | RESPIRATORY_TRACT | Status: DC
  Administered 2021-05-23 – 2021-05-24 (×3): 2.5 mg via RESPIRATORY_TRACT
  Filled 2021-05-23 (×3): qty 3

## 2021-05-23 MED ORDER — NICOTINE 21 MG/24HR TD PT24
21.0000 mg | MEDICATED_PATCH | Freq: Every day | TRANSDERMAL | Status: DC
  Administered 2021-05-23: 21 mg via TRANSDERMAL
  Filled 2021-05-23: qty 1

## 2021-05-23 MED ORDER — ALBUTEROL SULFATE (2.5 MG/3ML) 0.083% IN NEBU
2.5000 mg | INHALATION_SOLUTION | Freq: Four times a day (QID) | RESPIRATORY_TRACT | Status: DC | PRN
  Administered 2021-05-24: 2.5 mg via RESPIRATORY_TRACT
  Filled 2021-05-23: qty 3

## 2021-05-23 MED ORDER — HYDRALAZINE HCL 20 MG/ML IJ SOLN
10.0000 mg | Freq: Once | INTRAMUSCULAR | Status: AC
  Administered 2021-05-23: 10 mg via INTRAVENOUS
  Filled 2021-05-23: qty 1

## 2021-05-23 NOTE — ED Notes (Signed)
Pt states she has voided 4 times since receiving lasix.

## 2021-05-23 NOTE — ED Notes (Signed)
Dr. Patel at bedside 

## 2021-05-23 NOTE — ED Provider Notes (Signed)
[facility] Emergency Department Provider Note  ____________________________________________   Event Date/Time   First MD Initiated Contact with Patient 05/23/21 1234     (approximate)  I have reviewed the triage vital signs and the nursing notes.   HISTORY  Chief Complaint Respiratory Distress    HPI Cindy Gutierrez is a 60 y.o. female with COPD who comes in with shortness of breath.  Patient reports feeling like her COPD is flaring up.  She reports not feeling well for the past 3 days with cough, shortness of breath.  States that she has been taking her inhalers, leftover steroids, leftover doxycycline without any significant improvement.  Nothing made it worse.  Her symptoms are moderate to severe.  Denies any leg swelling, history of PEs, abdominal pain or other concerns.          Past Medical History:  Diagnosis Date   COPD (chronic obstructive pulmonary disease) ([place])    Hypertension     Patient Active Problem List   Diagnosis Date Noted   Aortic valve regurgitation 05/19/2021   Cigarette smoker 05/19/2021   Depression 05/19/2021   Primary hyperparathyroidism ([place]) 05/19/2021   Lymphedema, not elsewhere classified 05/19/2021   Major depressive disorder, single episode, moderate ([place]) 05/19/2021   Other chest pain 05/19/2021   Pulmonary nodule 05/19/2021   COPD exacerbation ([place]) 10/03/2020   Colon cancer screening    Heart murmur 10/20/2017   Chronic obstructive pulmonary disease, unspecified ([place]) 06/13/2017   Essential hypertension 06/13/2017   Tobacco use 06/13/2017   Herpes simplex 05/31/2017   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 Authorizing Provider  albuterol (PROVENTIL HFA;VENTOLIN HFA) 108 (90 Base) MCG/ACT inhaler Inhale 1-2 puffs into the lungs every 6 (six) hours as needed for wheezing or shortness of breath. 06/26/16   Hagler, Jami L, PA-C  Fluticasone-Salmeterol (ADVAIR) 250-50 MCG/DOSE AEPB Inhale 1 puff into the lungs 2 (two) times daily.    [provider]  FUROSEMIDE PO Take by mouth.    [provider]  Tiotropium Bromide Monohydrate (SPIRIVA RESPIMAT IN) Inhale 1 puff into the lungs in the morning and at bedtime.    [provider]    Allergies Sulfa antibiotics, Bee pollen, Lisinopril, Losartan, Penicillins, and Tiotropium bromide monohydrate  No family history on file.  Social History Social History   Tobacco Use   Smoking status: Every Day    Packs/day: 0.50    Types: Cigarettes   Smokeless tobacco: Never  Vaping Use   Vaping Use: Never used  Substance Use Topics   Alcohol use: Yes   Drug use: No      Review of Systems Constitutional: No fever/chills Eyes: No visual changes. ENT: No sore throat. Cardiovascular: No chest pain Respiratory: Positive for SOB, cough Gastrointestinal: No abdominal pain.  No nausea, no vomiting.  No diarrhea.  No constipation. Genitourinary: Negative for dysuria. Musculoskeletal: Negative for back pain. Skin: Negative for rash. Neurological: Negative for headaches, focal weakness or numbness. All other ROS negative ____________________________________________   PHYSICAL EXAM:  VITAL SIGNS: ED Triage Vitals  Enc Vitals Group  BP 05/23/21 1224 (!) 184/72     Pulse Rate 05/23/21 1224 (!) 106     Resp 05/23/21 1224 (!) 28     Temp 05/23/21 1224 98.4 F (36.9 C)     Temp Source 05/23/21 1224 Oral     SpO2 05/23/21 1224 (!) 87 %     Weight 05/23/21 1221 175 lb (79.4 kg)     Height 05/23/21 1221 '5\' 10"'$  (1.778 m)     Head Circumference --       Peak Flow --      Pain Score 05/23/21 1221 0     Pain Loc --      Pain Edu? --      Excl. in [place]? --     Constitutional: Alert and oriented. Well appearing and in no acute distress. Eyes: Conjunctivae are normal. EOMI. Head: Atraumatic. Nose: No congestion/rhinnorhea. Mouth/Throat: Mucous membranes are moist.   Neck: No stridor. Trachea Midline. FROM Cardiovascular: Normal rate, regular rhythm. Grossly normal heart sounds.  Good peripheral circulation. Respiratory: Increased work of breathing on 2 L of oxygen with tight air sounds Gastrointestinal: Soft and nontender. No distention. No abdominal bruits.  Musculoskeletal: No lower extremity tenderness nor edema.  No joint effusions. Neurologic:  Normal speech and language. No gross focal neurologic deficits are appreciated.  Skin:  Skin is warm, dry and intact. No rash noted. Psychiatric: Mood and affect are normal. Speech and behavior are normal. GU: Deferred   ____________________________________________   LABS (all labs ordered are listed, but only abnormal results are displayed)  Labs Reviewed  RESP PANEL BY RT-PCR (FLU A&B, COVID) ARPGX2  BASIC METABOLIC PANEL  CBC  PROCALCITONIN  TROPONIN I (HIGH SENSITIVITY)   ____________________________________________   ED ECG REPORT I, Vanessa [place], the attending physician, personally viewed and interpreted this ECG.  Normal sinus rate of 85, no ST elevation, no T wave versions, normal intervals except for type I AV block ____________________________________________  RADIOLOGY Robert Bellow, personally viewed and evaluated these images (plain radiographs) as part of my medical decision making, as well as reviewing the written report by the radiologist.  ED MD interpretation:  no pna   Official radiology report(s): DG Chest 2 View  Result Date: 05/23/2021 CLINICAL DATA:  Shortness of breath for 3 days. Productive cough. Hypoxia. EXAM: CHEST - 2 VIEW COMPARISON:   None. FINDINGS: The heart size and mediastinal contours are within normal limits. Surgical clips again seen in the superior mediastinum. Pulmonary hyperinflation is again seen, consistent with COPD. Mild scarring again seen in the lateral left lung base. Both lungs are otherwise clear. The visualized skeletal structures are unremarkable. IMPRESSION: COPD.  No active cardiopulmonary disease. Electronically Signed   By: Marlaine Hind M.D.   On: 05/23/2021 12:49    ____________________________________________   PROCEDURES  Procedure(s) performed (including Critical Care):  .1-3 Lead EKG Interpretation  Date/Time: 05/23/2021 12:47 PM Performed by: Vanessa [place], MD Authorized by: Vanessa [place], MD     Interpretation: normal     ECG rate:  90s   ECG rate assessment: normal     Rhythm: sinus rhythm     Ectopy: none     Conduction: normal     ____________________________________________   INITIAL IMPRESSION / ASSESSMENT AND PLAN / ED COURSE   Cindy Gutierrez was evaluated in Emergency Department on 05/23/2021 for the symptoms described in the history of present illness. She was evaluated in the context of the global COVID-19 pandemic, which  necessitated consideration that the patient might be at risk for infection with the SARS-CoV-2 virus that causes COVID-19. Institutional protocols and algorithms that pertain to the evaluation of patients at risk for COVID-19 are in a state of rapid change based on information released by regulatory bodies including the CDC and federal and state organizations. These policies and algorithms were followed during the patient's care in the ED.     Pt presents with SOB.  Most likely COPD exacerbation given prior history of this  differential also includes: PNA-will get xray to evaluation Anemia-CBC to evaluate ACS- will get trops Arrhythmia-Will get EKG and keep on monitor.  COVID- will get testing per algorithm. PE-lower suspicion given no risk factors and  other cause more likely   Patient will be given 3 duo nebs, steroids  Patient feeling better after the treatments but with ambulation is still pretty dyspneic.  Discussed with patient given she failed chronic and outpatient treatment with her doxycycline and prednisone that she was taking on her own and she will admit to hospital for COPD exacerbation  ____________________________________________   FINAL CLINICAL IMPRESSION(S) / ED DIAGNOSES   Final diagnoses:  Respiratory distress  Acute respiratory failure with hypoxia (HCC)  COPD exacerbation ([place])     MEDICATIONS GIVEN DURING THIS VISIT:  Medications  methylPREDNISolone sodium succinate (SOLU-MEDROL) 125 mg/2 mL injection 125 mg (has no administration in time range)  ipratropium-albuterol (DUONEB) 0.5-2.5 (3) MG/3ML nebulizer solution 3 mL (has no administration in time range)  ipratropium-albuterol (DUONEB) 0.5-2.5 (3) MG/3ML nebulizer solution 3 mL (has no administration in time range)  ipratropium-albuterol (DUONEB) 0.5-2.5 (3) MG/3ML nebulizer solution 3 mL (has no administration in time range)     ED Discharge Orders     None        Note:  This document was prepared using Dragon voice recognition software and may include unintentional dictation errors.   Vanessa [place], MD 05/23/21 1434

## 2021-05-23 NOTE — ED Notes (Signed)
Pt enjoyed her dinner and additional ginger ale.

## 2021-05-23 NOTE — ED Notes (Signed)
Pt declined occult test at this time, wanting to ask dr about necessity tomorrow.

## 2021-05-23 NOTE — H&P (Signed)
History and Physical    Cindy Gutierrez [id number] DOB: Jan 20, 1961 DOA: 05/23/2021  [facility]    Patient coming from:  Home    Chief Complaint:  SOB, Acute respiratory hypoxia.   HPI: Cindy Gutierrez is a 60 y.o. female seen in ed with complaints of sob since the past 3 days along with cough.  Patient has a history of COPD and is an ongoing tobacco smoker, he does not have any history of home O2.  Patient denies any chest pain denies nausea vomiting fevers chills flank pain neurological symptoms urinary abdomen or bladder problems.  Patient brought by EMS.  Pt has past medical history of hypertension, COPD, tobacco abuse, primary hyperparathyroidism, pulmonary nodule.  Patient has a history of aortic stenosis and dr[facility], No echo in chart. [facility] because [facility]. Pt has not seen cardiology since 2019. Has cut down smoking.   ED Course:  Vitals:   05/23/21 1400 05/23/21 1430 05/23/21 1435 05/23/21 1500  BP: (!) 189/69  (!) 178/65 (!) 185/59  Pulse: 67  77 69  Resp: '15 20  20  '$ Temp:      TempSrc:      SpO2: 97%  96% 96%  Weight:      Height:      In Ed pt is alert, afebrile and hypoxic at 87% on room air, patient started on 2 L nasal cannula and O2 sats are 95%.  Patient also given DuoNeb therapy along with steroids.  Home meds reviewed" Advair tiotropium Lasix and Ventolin.  Review of Systems:  Review of Systems  All other systems reviewed and are negative.   Past Medical History:  Diagnosis Date   COPD (chronic obstructive pulmonary disease) ([place])    Hypertension     Past Surgical History:  Procedure Laterality Date   COLONOSCOPY WITH PROPOFOL N/A 08/21/2018   Procedure: COLONOSCOPY WITH PROPOFOL;  Surgeon: Lin Landsman, MD;  [facility] ENDOSCOPY;  Service: Gastroenterology;  Laterality: N/A;   TUBAL LIGATION       reports that she has been smoking cigarettes. She has been smoking  an average of .5 packs per day. She has never used smokeless tobacco. She reports current alcohol use. She reports that she does not use drugs.  Allergies  Allergen Reactions   Sulfa Antibiotics Hives and Rash   Bee Pollen Hives   Lisinopril Nausea Only   Losartan    Penicillins Hives   Tiotropium Bromide Monohydrate     Other reaction(s): Cough    Family History  Problem Relation Age of Onset   Alzheimer's disease Mother    Heart disease Father     Prior to Admission medications   Medication Sig Start Date End Date Taking? Authorizing Provider  albuterol (PROVENTIL HFA;VENTOLIN HFA) 108 (90 Base) MCG/ACT inhaler Inhale 1-2 puffs into the lungs every 6 (six) hours as needed for wheezing or shortness of breath. 06/26/16   Hagler, Jami L, PA-C  Fluticasone-Salmeterol (ADVAIR) 250-50 MCG/DOSE AEPB Inhale 1 puff into the lungs 2 (two) times daily.    [provider]  FUROSEMIDE PO Take by mouth.    [provider]  Tiotropium Bromide Monohydrate (SPIRIVA RESPIMAT IN) Inhale 1 puff into the lungs in the morning and at bedtime.    [provider]    Physical Exam: Vitals:   05/23/21 1400 05/23/21 1430 05/23/21 1435 05/23/21 1500  BP: (!) 189/69  (!) 178/65 Marland Kitchen)  185/59  Pulse: 67  77 69  Resp: '15 20  20  '$ Temp:      TempSrc:      SpO2: 97%  96% 96%  Weight:      Height:       Physical Exam Vitals reviewed.  Constitutional:      General: She is not in acute distress.    Appearance: Normal appearance. She is not ill-appearing.  HENT:     Head: Normocephalic and atraumatic.     Right Ear: External ear normal.     Left Ear: External ear normal.     Nose: Nose normal.     Mouth/Throat:     Mouth: Mucous membranes are moist.  Eyes:     Extraocular Movements: Extraocular movements intact.     Pupils: Pupils are equal, round, and reactive to light.  Neck:     Vascular: No carotid bruit.  Cardiovascular:     Rate and Rhythm: Normal rate and regular  rhythm.     Pulses: Normal pulses.     Heart sounds: Normal heart sounds.  Pulmonary:     Effort: Pulmonary effort is normal. No tachypnea, bradypnea, accessory muscle usage or prolonged expiration.     Breath sounds: Examination of the right-lower field reveals rales. Examination of the left-lower field reveals rales. Rales present.  Abdominal:     General: Bowel sounds are normal. There is no distension.     Palpations: Abdomen is soft. There is no mass.     Tenderness: There is no abdominal tenderness. There is no guarding.     Hernia: No hernia is present.  Musculoskeletal:     Right lower leg: No edema.     Left lower leg: No edema.  Skin:    General: Skin is warm.  Neurological:     General: No focal deficit present.     Mental Status: She is alert and oriented to person, place, and time.     Cranial Nerves: No cranial nerve deficit.     Motor: No weakness.  Psychiatric:        Mood and Affect: Mood normal.        Behavior: Behavior normal.    Labs on Admission: I have personally reviewed following labs and imaging studies  No results for input(s): CKTOTAL, CKMB, TROPONINI in the last 72 hours. Lab Results  Component Value Date   WBC 7.3 05/23/2021   HGB 14.1 05/23/2021   HCT 43.1 05/23/2021   MCV 87.2 05/23/2021   PLT 228 05/23/2021    Recent Labs  Lab 05/23/21 1308  NA 141  K 3.9  CL 110  CO2 26  BUN 12  CREATININE 0.77  CALCIUM 8.7*  GLUCOSE 106*   No results found for: CHOL, HDL, LDLCALC, TRIG No results found for: DDIMER Invalid input(s): POCBNP  Urinalysis No results found for: COLORURINE, APPEARANCEUR, LABSPEC, PHURINE, GLUCOSEU, HGBUR, BILIRUBINUR, KETONESUR, PROTEINUR, UROBILINOGEN, NITRITE, LEUKOCYTESUR   COVID-19 Labs No results for input(s): DDIMER, FERRITIN, LDH, CRP in the last 72 hours.  Lab Results  Component Value Date   [place] NEGATIVE 05/23/2021   [place] NEGATIVE 03/29/2021   [place] NEGATIVE 10/03/2020    [place] NEGATIVE 08/28/2020    Radiological Exams on Admission: DG Chest 2 View  Result Date: 05/23/2021 CLINICAL DATA:  Shortness of breath for 3 days. Productive cough. Hypoxia. EXAM: CHEST - 2 VIEW COMPARISON:  None. FINDINGS: The heart size and mediastinal contours are within normal limits. Surgical clips again seen in  the superior mediastinum. Pulmonary hyperinflation is again seen, consistent with COPD. Mild scarring again seen in the lateral left lung base. Both lungs are otherwise clear. The visualized skeletal structures are unremarkable. IMPRESSION: COPD.  No active cardiopulmonary disease. Electronically Signed   By: Marlaine Hind M.D.   On: 05/23/2021 12:49    EKG: Independently reviewed.  SR 85, Qtc 471 RAE.  Echocardiogram: None in chart.    Assessment/Plan Principal Problem:   Acute respiratory failure with hypoxia (HCC) Active Problems:   Chronic obstructive pulmonary disease, unspecified (HCC)   Essential hypertension   Tobacco use   ARF with hypoxia: -d/d include copd exacerbation, CHF, PE. -we will cont pt on solumedrol and duoneb. -dimer pending.  -we will obtain 2 d echo.  -cardiology consult as needed.  -supplemental oxygen.   COPD: -pt has almost stopped smoking.  HTN; Blood pressure (!) 185/59, pulse 69, temperature 98.4 F (36.9 C), temperature source Oral, resp. rate 20, height '5\' 10"'$  (1.778 m), weight 79.4 kg, SpO2 96 %. Pt is on lasix and we will give 20 mg iv x 2 doses with strict I/O. Hydralazine PRN.   Tobacco abuse: Pt has almost cut out smoking. We will start nicotine patch.   DVT prophylaxis:  Heparin    Code Status:  Full code    Family Communication:  Darnelle Catalan (Sister)  [phone] [facility] Phone)   Disposition Plan:  Home    Consults called:  None   Admission status: Inpatient     Para Skeans MD [facility] [phone] How to contact the [facility] Attending or Consulting provider [place] or covering  provider during after hours [place], for this patient.    Check the care team in [facility] and look for a) attending/consulting [facility] provider listed and b) the [facility] team listed Log into [email or web address] and use [place]'s universal password to access. If you do not have the password, please contact the hospital operator. Locate the [facility] provider you are looking for under [facility] and page to a number that you can be directly reached. If you still have difficulty reaching the provider, please page the [facility] (Director on Call) for the Hospitalists listed on amion for assistance. [email or web address] Password [facility] 05/23/2021, 3:39 PM

## 2021-05-23 NOTE — ED Notes (Addendum)
Pt independently ambulated to toilet in room, but became very [facility] after. Urine collection deferred due to this.

## 2021-05-23 NOTE — ED Triage Notes (Signed)
Pt via POV from home. Pt states she has been feeling SOB for the past 3 days. Pt has a hx COPD. Pt states that she has a productive cough. Pt is 87% on RA and unable to speak in complete sentences. Pt is A&Ox4 but working hard to breathe. Pt placed on 2L [place] in triage.

## 2021-05-23 NOTE — ED Notes (Signed)
After placing pt on 2L [place], pt WOB decreased pt still having trouble speaking in complete sentences. Pt is O2 increased to 96% and RR decreased to 22 and HR decreased to 89.

## 2021-05-24 ENCOUNTER — Inpatient Hospital Stay: Admit: 2021-05-24 | Payer: No Typology Code available for payment source

## 2021-05-24 ENCOUNTER — Inpatient Hospital Stay
Admit: 2021-05-24 | Discharge: 2021-05-24 | Disposition: A | Discharge status: Home / Self Care | Payer: No Typology Code available for payment source | Attending: Internal Medicine | Admitting: Internal Medicine

## 2021-05-24 DIAGNOSIS — J9601 Acute respiratory failure with hypoxia: Secondary | ICD-10-CM | POA: Diagnosis not present

## 2021-05-24 LAB — CBC
HCT: 43.8 % (ref 36.0–46.0)
Hemoglobin: 14.6 g/dL (ref 12.0–15.0)
MCH: 28.6 pg (ref 26.0–34.0)
MCHC: 33.3 g/dL (ref 30.0–36.0)
MCV: 85.9 fL (ref 80.0–100.0)
Platelets: 255 10*3/uL (ref 150–400)
RBC: 5.1 MIL/uL (ref 3.87–5.11)
RDW: 12.5 % (ref 11.5–15.5)
WBC: 14.8 10*3/uL — ABNORMAL HIGH (ref 4.0–10.5)
nRBC: 0 % (ref 0.0–0.2)

## 2021-05-24 LAB — ECHOCARDIOGRAM COMPLETE
AR max vel: 1.83 cm2
AV Area VTI: 1.8 cm2
AV Area mean vel: 1.88 cm2
AV Mean grad: 6 mmHg
AV Peak grad: 11.7 mmHg
Ao pk vel: 1.71 m/s
Area-P 1/2: 7.02 cm2
Height: 70 in
MV VTI: 2.45 cm2
S' Lateral: 2.5 cm
Weight: 2800 oz

## 2021-05-24 LAB — HEMOGLOBIN A1C
Hgb A1c MFr Bld: 5.9 % — ABNORMAL HIGH (ref 4.8–5.6)
Mean Plasma Glucose: 122.63 mg/dL

## 2021-05-24 LAB — PROCALCITONIN: Procalcitonin: <0.1 ng/mL

## 2021-05-24 MED ORDER — UMECLIDINIUM BROMIDE 62.5 MCG/INH IN AEPB
1.0000 | INHALATION_SPRAY | Freq: Every day | RESPIRATORY_TRACT | Status: DC
  Administered 2021-05-24 – 2021-05-25 (×2): 1 via RESPIRATORY_TRACT
  Filled 2021-05-24: qty 7

## 2021-05-24 MED ORDER — PREDNISONE 20 MG PO TABS
40.0000 mg | ORAL_TABLET | Freq: Every day | ORAL | Status: DC
  Administered 2021-05-24 – 2021-05-25 (×2): 40 mg via ORAL
  Filled 2021-05-24 (×2): qty 2

## 2021-05-24 MED ORDER — FUROSEMIDE 20 MG PO TABS
20.0000 mg | ORAL_TABLET | Freq: Every day | ORAL | Status: DC
  Administered 2021-05-24 – 2021-05-25 (×2): 20 mg via ORAL
  Filled 2021-05-24 (×3): qty 1

## 2021-05-24 MED ORDER — ENOXAPARIN SODIUM 40 MG/0.4ML IJ SOSY: 40.0000 mg | PREFILLED_SYRINGE | INTRAMUSCULAR | Status: DC

## 2021-05-24 MED ORDER — ACETYLCYSTEINE 20 % IN SOLN
2.0000 mL | Freq: Two times a day (BID) | RESPIRATORY_TRACT | Status: DC
  Administered 2021-05-24: 2 mL via RESPIRATORY_TRACT
  Filled 2021-05-24 (×3): qty 4

## 2021-05-24 MED ORDER — IPRATROPIUM-ALBUTEROL 0.5-2.5 (3) MG/3ML IN SOLN
3.0000 mL | Freq: Four times a day (QID) | RESPIRATORY_TRACT | Status: DC
  Administered 2021-05-24 – 2021-05-25 (×5): 3 mL via RESPIRATORY_TRACT
  Filled 2021-05-24 (×5): qty 3

## 2021-05-24 MED ORDER — LEVOFLOXACIN 500 MG PO TABS
500.0000 mg | ORAL_TABLET | Freq: Every day | ORAL | Status: DC
  Administered 2021-05-24 – 2021-05-25 (×2): 500 mg via ORAL
  Filled 2021-05-24 (×2): qty 1

## 2021-05-24 NOTE — ED Notes (Signed)
Rn to bedside to introduce self to pt. Pt is CAOx4 and watching TV in no signs of distress.

## 2021-05-24 NOTE — ED Notes (Signed)
Pt was up and bathed herself then put herself back to bed.

## 2021-05-24 NOTE — Progress Notes (Signed)
*  PRELIMINARY RESULTS* Echocardiogram 2D Echocardiogram has been performed.  Cindy Gutierrez 05/24/2021, 2:48 PM

## 2021-05-24 NOTE — ED Notes (Signed)
Pt is very restless. She advised she needs some food. Breakfast tray was not what she wanted she doesn't eat what was sent. She was advised once she goes to the floor she can order her own meals. She states "I just need to get out of here".

## 2021-05-24 NOTE — Progress Notes (Signed)
PROGRESS NOTE    Cindy Gutierrez  [id number] DOB: 08-15-1961 DOA: 05/23/2021 [facility]  Outpatient Specialists: [facility], [facility]    Brief Narrative:   Cindy Gutierrez is a 60 y.o. female seen in ed with complaints of sob since the past 3 days along with cough.  Patient has a history of COPD and is an ongoing tobacco smoker, he does not have any history of home O2.  Patient denies any chest pain denies nausea vomiting fevers chills flank pain neurological symptoms urinary abdomen or bladder problems.  Patient brought by EMS.   Pt has past medical history of hypertension, COPD, tobacco abuse, primary hyperparathyroidism, pulmonary nodule.  Patient has a history of aortic stenosis and [facility], No [facility] in [facility]. [facility] because [facility]. Pt has not seen cardiology since 2019. Has cut down smoking.    Assessment & Plan:   Principal Problem:   Acute respiratory failure with hypoxia (HCC) Active Problems:   Chronic obstructive pulmonary disease, unspecified (HCC)   Essential hypertension   Tobacco use  # Acute hypoxic respiratory failure 2/2 copd exacerbation - O2 via [place], wean as able - treating copd as below  # COPD with exacerbation Here w/ several days SOB, cough. CXR clear. Trops wnl. BNP wnl. Dimer wnl. Requiring O2. Still smokes but has cut down considerably. Some improvement since admission - continue steroids, switching to po prednisone - start levofloxacin - continue O2 - start duonebs - sputum culture if cough becomes productive  # History valvular heart disoreder No TTE available to review.  - f/u TTE  # HTN Here elvated - re-start home lasix  # HCV Recent diagnosis - outpt f/u     DVT prophylaxis: lovenox Code Status: full Family Communication: none @ bedside  Level of care: Progressive Cardiac Status is: Inpatient  Remains inpatient appropriate because:Inpatient level  of care appropriate due to severity of illness  Dispo: The patient is from: Home              Anticipated d/c is to: Home              Patient currently is not medically stable to d/c.   Difficult to place patient No        Consultants:  none  Procedures: none  Antimicrobials:  Levofloxacin 8/22>    Subjective: This morning remains sob. No chest pain. Has cough that is not productive  Objective: Vitals:   05/24/21 0400 05/24/21 0500 05/24/21 0630 05/24/21 0700  BP: (!) 152/56 (!) 155/73 (!) 154/69 (!) 146/74  Pulse: 85 82 78 79  Resp: (!) 21 18 (!) 24 (!) 21  Temp:      TempSrc:      SpO2: 97% 95% 96% 95%  Weight:      Height:        Intake/Output Summary (Last 24 hours) at 05/24/2021 0757 Last data filed at 05/24/2021 0038 Gross per 24 hour  Intake 100 ml  Output --  Net 100 ml   Filed Weights   05/23/21 1221  Weight: 79.4 kg    Examination:  General exam: Appears calm and comfortable  Respiratory system: tachypnic. Hyperinflation, decreased movement, scattered wheeze and rhonchi Cardiovascular system: S1 & S2 heard, RRR. No JVD, murmurs, rubs, gallops or clicks. No pedal edema. Gastrointestinal system: Abdomen is nondistended, soft and nontender. No organomegaly or masses felt. Normal bowel sounds heard. Central nervous system: Alert  and oriented. No focal neurological deficits. Extremities: Symmetric 5 x 5 power. Skin: No rashes, lesions or ulcers Psychiatry: Judgement and insight appear normal. Mood & affect appropriate.     Data Reviewed: I have personally reviewed following labs and imaging studies  CBC: Recent Labs  Lab 05/23/21 1308  WBC 7.3  HGB 14.1  HCT 43.1  MCV 87.2  PLT [id number]   Basic Metabolic Panel: Recent Labs  Lab 05/23/21 1308 05/23/21 1538  NA 141  --   K 3.9  --   CL 110  --   CO2 26  --   GLUCOSE 106*  --   BUN 12  --   CREATININE 0.77  --   CALCIUM 8.7*  --   MG  --  1.6*   GFR: Estimated Creatinine  Clearance: 79.9 mL/min (by C-G formula based on SCr of 0.77 mg/dL). Liver Function Tests: No results for input(s): AST, ALT, ALKPHOS, BILITOT, PROT, ALBUMIN in the last 168 hours. No results for input(s): LIPASE, AMYLASE in the last 168 hours. No results for input(s): AMMONIA in the last 168 hours. Coagulation Profile: No results for input(s): INR, PROTIME in the last 168 hours. Cardiac Enzymes: No results for input(s): CKTOTAL, CKMB, CKMBINDEX, TROPONINI in the last 168 hours. BNP (last 3 results) No results for input(s): PROBNP in the last 8760 hours. HbA1C: No results for input(s): HGBA1C in the last 72 hours. CBG: No results for input(s): GLUCAP in the last 168 hours. Lipid Profile: No results for input(s): CHOL, HDL, LDLCALC, TRIG, CHOLHDL, LDLDIRECT in the last 72 hours. Thyroid Function Tests: Recent Labs    05/23/21 1538  TSH 0.417  FREET4 1.18*   Anemia Panel: No results for input(s): VITAMINB12, FOLATE, FERRITIN, TIBC, IRON, RETICCTPCT in the last 72 hours. Urine analysis: No results found for: COLORURINE, APPEARANCEUR, LABSPEC, PHURINE, GLUCOSEU, HGBUR, BILIRUBINUR, KETONESUR, PROTEINUR, UROBILINOGEN, NITRITE, LEUKOCYTESUR Sepsis Labs: '@LABRCNTIP'$ (procalcitonin:4,lacticidven:4)  ) Recent Results (from the past 240 hour(s))  Resp Panel by RT-PCR (Flu A&B, Covid) Nasopharyngeal Swab     Status: None   Collection Time: 05/23/21  1:08 PM   Specimen: Nasopharyngeal Swab; Nasopharyngeal(NP) swabs in vial transport medium  Result Value Ref Range Status   SARS Coronavirus 2 by RT PCR NEGATIVE NEGATIVE Final    Comment: (NOTE) SARS-CoV-2 target nucleic acids are NOT DETECTED.  The SARS-CoV-2 RNA is generally detectable in upper respiratory specimens during the acute phase of infection. The lowest concentration of SARS-CoV-2 viral copies this assay can detect is 138 copies/mL. A negative result does not preclude SARS-Cov-2 infection and should not be used as the sole  basis for treatment or other patient management decisions. A negative result may occur with  improper specimen collection/handling, submission of specimen other than nasopharyngeal swab, presence of viral mutation(s) within the areas targeted by this assay, and inadequate number of viral copies(<138 copies/mL). A negative result must be combined with clinical observations, patient history, and epidemiological information. The expected result is Negative.  Fact Sheet for Patients:  EntrepreneurPulse.com.au  Fact Sheet for Healthcare Providers:  IncredibleEmployment.be  This test is no t yet approved or cleared by the [place] FDA and  has been authorized for detection and/or diagnosis of SARS-CoV-2 by FDA under an Emergency Use Authorization (EUA). This EUA will remain  in effect (meaning this test can be used) for the duration of the COVID-19 declaration under Section 564(b)(1) of the Act, 21 U.S.C.section 360bbb-3(b)(1), unless the authorization is terminated  or revoked sooner.  Influenza A by PCR NEGATIVE NEGATIVE Final   Influenza B by PCR NEGATIVE NEGATIVE Final    Comment: (NOTE) The Xpert Xpress SARS-CoV-2/FLU/RSV plus assay is intended as an aid in the diagnosis of influenza from Nasopharyngeal swab specimens and should not be used as a sole basis for treatment. Nasal washings and aspirates are unacceptable for Xpert Xpress SARS-CoV-2/FLU/RSV testing.  Fact Sheet for Patients: EntrepreneurPulse.com.au  Fact Sheet for Healthcare Providers: IncredibleEmployment.be  This test is not yet approved or cleared by the [place] FDA and has been authorized for detection and/or diagnosis of SARS-CoV-2 by FDA under an Emergency Use Authorization (EUA). This EUA will remain in effect (meaning this test can be used) for the duration of the COVID-19 declaration under Section 564(b)(1) of the Act,  21 U.S.C. section 360bbb-3(b)(1), unless the authorization is terminated or revoked.  Performed at [facility], [address], [place], [place] [address]          Radiology Studies: DG Chest 2 View  Result Date: 05/23/2021 CLINICAL DATA:  Shortness of breath for 3 days. Productive cough. Hypoxia. EXAM: CHEST - 2 VIEW COMPARISON:  None. FINDINGS: The heart size and mediastinal contours are within normal limits. Surgical clips again seen in the superior mediastinum. Pulmonary hyperinflation is again seen, consistent with COPD. Mild scarring again seen in the lateral left lung base. Both lungs are otherwise clear. The visualized skeletal structures are unremarkable. IMPRESSION: COPD.  No active cardiopulmonary disease. Electronically Signed   By: Marlaine Hind M.D.   On: 05/23/2021 12:49        Scheduled Meds:  albuterol  2.5 mg Nebulization Q6H   aspirin EC  81 mg Oral Daily   fluticasone furoate-vilanterol  1 puff Inhalation Daily   furosemide  20 mg Intravenous BID   heparin  5,000 Units Subcutaneous Q8H   methylPREDNISolone (SOLU-MEDROL) injection  80 mg Intravenous Q12H   nicotine  21 mg Transdermal Daily   sodium chloride flush  3 mL Intravenous Q12H   Continuous Infusions:  sodium chloride       LOS: 1 day    Time spent: 40 min    Desma Maxim, MD [facility]   If 7PM-7AM, please contact night-coverage [email or web address] Password [facility] 05/24/2021, 7:57 AM

## 2021-05-25 DIAGNOSIS — J9601 Acute respiratory failure with hypoxia: Secondary | ICD-10-CM | POA: Diagnosis not present

## 2021-05-25 LAB — BASIC METABOLIC PANEL
Anion gap: 9 (ref 5–15)
BUN: 20 mg/dL (ref 8–23)
CO2: 28 mmol/L (ref 22–32)
Calcium: 9.1 mg/dL (ref 8.9–10.3)
Chloride: 103 mmol/L (ref 98–111)
Creatinine, Ser: 0.82 mg/dL (ref 0.44–1.00)
GFR, Estimated: >60 mL/min (ref >60)
Glucose, Bld: 149 mg/dL — ABNORMAL HIGH (ref 70–99)
Potassium: 4 mmol/L (ref 3.5–5.1)
Sodium: 140 mmol/L (ref 135–145)

## 2021-05-25 MED ORDER — PREDNISONE 20 MG PO TABS: 40.0000 mg | ORAL_TABLET | Freq: Every day | ORAL | 0 refills | Status: DC

## 2021-05-25 MED ORDER — LEVOFLOXACIN 500 MG PO TABS: 500.0000 mg | ORAL_TABLET | Freq: Every day | ORAL | 0 refills | Status: DC

## 2021-05-25 NOTE — Progress Notes (Signed)
Discharge instructions reviewed with patient. Patient verbalizes understanding and all questions answered at this time. Discharge faxed to [facility] at [phone] [facility], RN. Paper prescriptions requested for patient to take to [facility] to be filled. Patient transported via private vehicle at discharge.

## 2021-05-25 NOTE — Discharge Summary (Signed)
Cindy Gutierrez [id number] DOB: 1961/05/26 DOA: 05/23/2021  [facility]  Admit date: 05/23/2021 Discharge date: 05/25/2021  Time spent: 35 minutes  Recommendations for Outpatient Follow-up:  Close f/u with pcp or pulmonology     Discharge Diagnoses:  Principal Problem:   Acute respiratory failure with hypoxia [facility]) Active Problems:   Chronic obstructive pulmonary disease, unspecified ([place])   Essential hypertension   Tobacco use   Discharge Condition: stable  Diet recommendation: heart healthy  Filed Weights   05/23/21 1221  Weight: 79.4 kg    History of present illness:  Cindy Gutierrez is a 60 y.o. female seen in ed with complaints of sob since the past 3 days along with cough.  Patient has a history of COPD and is an ongoing tobacco smoker, he does not have any history of home O2.  Patient denies any chest pain denies nausea vomiting fevers chills flank pain neurological symptoms urinary abdomen or bladder problems.  Patient brought by EMS.   Pt has past medical history of hypertension, COPD, tobacco abuse, primary hyperparathyroidism, pulmonary nodule.  Patient has a history of aortic stenosis and [facility], No [facility] in [facility]. [facility] because [facility]. Pt has not seen cardiology since 2019. Has cut down smoking.   Hospital Course:   Patient presented with acute copd exacerbation. Had taken azithromycin at home but did not improve. Initially requiring O2 by nasal cannula, weaned off and symptomatically much improved hospital day 2. Treated with steroids and levofloxacin. Will continue both to complete a 5 day course. [facility]. For recent diagnosis of hcv, will f/u with her pcp at the [place]. We did obtain TTE given SOB and history of "valvular heart disease." TTE showed grade 2 dd, otherwise unremarkable.      Procedures: none   Consultations: none  Discharge Exam: Vitals:    05/25/21 0730 05/25/21 0825  BP: (!) 142/60 (!) 152/65  Pulse: 65 (!) 53  Resp: (!) 25 18  Temp:  (!) 97.5 F (36.4 C)  SpO2: 96%     General: NAD Cardiovascular: RRR, no murmur Respiratory: CTAB  Discharge Instructions   Discharge Instructions     Diet - low sodium heart healthy   Complete by: As directed    Increase activity slowly   Complete by: As directed       Allergies as of 05/25/2021       Reactions   Sulfa Antibiotics Hives, Rash   Bee Pollen Hives   Lisinopril Nausea Only   Losartan    Penicillins Hives   Tiotropium Bromide Monohydrate    Other reaction(s): Cough        Medication List     TAKE these medications    albuterol 108 (90 Base) MCG/ACT inhaler Commonly known as: VENTOLIN HFA Inhale 1-2 puffs into the lungs every 6 (six) hours as needed for wheezing or shortness of breath.   Fluticasone-Salmeterol 250-50 MCG/DOSE Aepb Commonly known as: ADVAIR Inhale 1 puff into the lungs 2 (two) times daily.   FUROSEMIDE PO Take 20 mg by mouth daily.   Incruse Ellipta 62.5 MCG/INH Aepb Generic drug: umeclidinium bromide Inhale 1 puff into the lungs daily.   levofloxacin 500 MG tablet Commonly known as: LEVAQUIN Take 1 tablet (500 mg total) by mouth daily. Start taking on: May 26, 2021   predniSONE 20 MG tablet Commonly known as: DELTASONE Take 2 tablets (40 mg  total) by mouth daily with breakfast. Start taking on: May 26, 2021   SPIRIVA RESPIMAT IN Inhale 1 puff into the lungs in the morning and at bedtime.       Allergies  Allergen Reactions   Sulfa Antibiotics Hives and Rash   Bee Pollen Hives   Lisinopril Nausea Only   Losartan    Penicillins Hives   Tiotropium Bromide Monohydrate     Other reaction(s): Cough    Follow-up Information     [facility].   Contact information: [address] [place] [address] [phone]                  The results of significant diagnostics from  this hospitalization (including imaging, microbiology, ancillary and laboratory) are listed below for reference.    Significant Diagnostic Studies: DG Chest 2 View  Result Date: 05/23/2021 CLINICAL DATA:  Shortness of breath for 3 days. Productive cough. Hypoxia. EXAM: CHEST - 2 VIEW COMPARISON:  None. FINDINGS: The heart size and mediastinal contours are within normal limits. Surgical clips again seen in the superior mediastinum. Pulmonary hyperinflation is again seen, consistent with COPD. Mild scarring again seen in the lateral left lung base. Both lungs are otherwise clear. The visualized skeletal structures are unremarkable. IMPRESSION: COPD.  No active cardiopulmonary disease. Electronically Signed   By: Marlaine Hind M.D.   On: 05/23/2021 12:49   ECHOCARDIOGRAM COMPLETE  Result Date: 05/24/2021    ECHOCARDIOGRAM REPORT   Patient Name:   Cindy Gutierrez [facility] Date of Exam: 05/24/2021 Medical Rec #:  [id number]     Height:       70.0 in Accession #:    [id number]    Weight:       175.0 lb Date of Birth:  03/26/1961     BSA:          1.972 m Patient Age:    60 years      BP:           166/68 mmHg Patient Gender: F             HR:           87 bpm. Exam Location:  [facility] Procedure: 2D Echo, Color Doppler and Cardiac Doppler Indications:     I35.0 Nonrheumatic aortic (valve) stenosis  History:         Patient has no prior history of Echocardiogram examinations.                  COPD; Risk Factors:Hypertension.  Sonographer:     Charmayne Sheer Referring Phys:  [place] Diagnosing Phys: Yolonda Kida MD  Sonographer Comments: Image acquisition challenging due to COPD. IMPRESSIONS  1. Left ventricular ejection fraction, by estimation, is 65 to 70%. The left ventricle has normal function. The left ventricle has no regional wall motion abnormalities. Left ventricular diastolic parameters are consistent with Grade II diastolic dysfunction (pseudonormalization).  2. Right ventricular systolic function is normal.  The right ventricular size is normal.  3. The mitral valve is normal in structure. No evidence of mitral valve regurgitation.  4. The aortic valve is normal in structure. Aortic valve regurgitation is not visualized. Conclusion(s)/Recommendation(s): Normal biventricular function without evidence of hemodynamically significant valvular heart disease. FINDINGS  Left Ventricle: Left ventricular ejection fraction, by estimation, is 65 to 70%. The left ventricle has normal function. The left ventricle has no regional wall motion abnormalities. The left ventricular internal cavity size was normal  in size. There is  no left ventricular hypertrophy. Left ventricular diastolic parameters are consistent with Grade II diastolic dysfunction (pseudonormalization). Right Ventricle: The right ventricular size is normal. No increase in right ventricular wall thickness. Right ventricular systolic function is normal. Left Atrium: Left atrial size was normal in size. Right Atrium: Right atrial size was normal in size. Pericardium: The pericardium was not well visualized. Mitral Valve: The mitral valve is normal in structure. No evidence of mitral valve regurgitation. MV peak gradient, 6.1 mmHg. The mean mitral valve gradient is 3.0 mmHg. Tricuspid Valve: The tricuspid valve is normal in structure. Tricuspid valve regurgitation is trivial. Aortic Valve: The aortic valve is normal in structure. Aortic valve regurgitation is not visualized. Aortic valve mean gradient measures 6.0 mmHg. Aortic valve peak gradient measures 11.7 mmHg. Aortic valve area, by VTI measures 1.80 cm. Pulmonic Valve: The pulmonic valve was normal in structure. Pulmonic valve regurgitation is not visualized. Aorta: The ascending aorta was not well visualized. IAS/Shunts: No atrial level shunt detected by color flow Doppler.  LEFT VENTRICLE PLAX 2D LVIDd:         3.90 cm  Diastology LVIDs:         2.50 cm  LV e' medial:    5.98 cm/s LV PW:         1.10 cm  LV E/e'  medial:  11.4 LV IVS:        0.90 cm  LV e' lateral:   6.74 cm/s LVOT diam:     1.80 cm  LV E/e' lateral: 10.1 LV SV:         51 LV SV Index:   26 LVOT Area:     2.54 cm  RIGHT VENTRICLE RV Basal diam:  3.80 cm LEFT ATRIUM             Index       RIGHT ATRIUM           Index LA diam:        2.60 cm 1.32 cm/m  RA Area:     11.40 cm LA Vol (A2C):   24.1 ml 12.22 ml/m RA Volume:   26.90 ml  13.64 ml/m LA Vol (A4C):   18.9 ml 9.58 ml/m LA Biplane Vol: 22.4 ml 11.36 ml/m  AORTIC VALVE                    PULMONIC VALVE AV Area (Vmax):    1.83 cm     PV Vmax:       1.54 m/s AV Area (Vmean):   1.88 cm     PV Vmean:      97.600 cm/s AV Area (VTI):     1.80 cm     PV VTI:        0.252 m AV Vmax:           171.00 cm/s  PV Peak grad:  9.5 mmHg AV Vmean:          115.000 cm/s PV Mean grad:  4.0 mmHg AV VTI:            0.286 m AV Peak Grad:      11.7 mmHg AV Mean Grad:      6.0 mmHg LVOT Vmax:         123.00 cm/s LVOT Vmean:        85.000 cm/s LVOT VTI:          0.202 m LVOT/AV VTI ratio: 0.71  AORTA Ao  Root diam: 2.80 cm MITRAL VALVE MV Area (PHT): 7.02 cm     SHUNTS MV Area VTI:   2.45 cm     Systemic VTI:  0.20 m MV Peak grad:  6.1 mmHg     Systemic Diam: 1.80 cm MV Mean grad:  3.0 mmHg MV Vmax:       1.23 m/s MV Vmean:      76.9 cm/s MV Decel Time: 108 msec MV E velocity: 68.10 cm/s MV A velocity: 110.00 cm/s MV E/A ratio:  0.62 Dwayne D Callwood MD Electronically signed by Yolonda Kida MD Signature Date/Time: 05/24/2021/5:02:34 PM    Final     Microbiology: Recent Results (from the past 240 hour(s))  Resp Panel by RT-PCR (Flu A&B, Covid) Nasopharyngeal Swab     Status: None   Collection Time: 05/23/21  1:08 PM   Specimen: Nasopharyngeal Swab; Nasopharyngeal(NP) swabs in vial transport medium  Result Value Ref Range Status   SARS Coronavirus 2 by RT PCR NEGATIVE NEGATIVE Final    Comment: (NOTE) SARS-CoV-2 target nucleic acids are NOT DETECTED.  The SARS-CoV-2 RNA is generally detectable in upper  respiratory specimens during the acute phase of infection. The lowest concentration of SARS-CoV-2 viral copies this assay can detect is 138 copies/mL. A negative result does not preclude SARS-Cov-2 infection and should not be used as the sole basis for treatment or other patient management decisions. A negative result may occur with  improper specimen collection/handling, submission of specimen other than nasopharyngeal swab, presence of viral mutation(s) within the areas targeted by this assay, and inadequate number of viral copies(<138 copies/mL). A negative result must be combined with clinical observations, patient history, and epidemiological information. The expected result is Negative.  Fact Sheet for Patients:  EntrepreneurPulse.com.au  Fact Sheet for Healthcare Providers:  IncredibleEmployment.be  This test is no t yet approved or cleared by the [place] FDA and  has been authorized for detection and/or diagnosis of SARS-CoV-2 by FDA under an Emergency Use Authorization (EUA). This EUA will remain  in effect (meaning this test can be used) for the duration of the COVID-19 declaration under Section 564(b)(1) of the Act, 21 U.S.C.section 360bbb-3(b)(1), unless the authorization is terminated  or revoked sooner.       Influenza A by PCR NEGATIVE NEGATIVE Final   Influenza B by PCR NEGATIVE NEGATIVE Final    Comment: (NOTE) The Xpert Xpress SARS-CoV-2/FLU/RSV plus assay is intended as an aid in the diagnosis of influenza from Nasopharyngeal swab specimens and should not be used as a sole basis for treatment. Nasal washings and aspirates are unacceptable for Xpert Xpress SARS-CoV-2/FLU/RSV testing.  Fact Sheet for Patients: EntrepreneurPulse.com.au  Fact Sheet for Healthcare Providers: IncredibleEmployment.be  This test is not yet approved or cleared by the [place] FDA and has been  authorized for detection and/or diagnosis of SARS-CoV-2 by FDA under an Emergency Use Authorization (EUA). This EUA will remain in effect (meaning this test can be used) for the duration of the COVID-19 declaration under Section 564(b)(1) of the Act, 21 U.S.C. section 360bbb-3(b)(1), unless the authorization is terminated or revoked.  Performed at [facility], [address] [place], [place] [address]      Labs: Basic Metabolic Panel: Recent Labs  Lab 05/23/21 1308 05/23/21 1538 05/25/21 0704  NA 141  --  140  K 3.9  --  4.0  CL 110  --  103  CO2 26  --  28  GLUCOSE 106*  --  149*  BUN 12  --  20  CREATININE 0.77  --  0.82  CALCIUM 8.7*  --  9.1  MG  --  1.6*  --    Liver Function Tests: No results for input(s): AST, ALT, ALKPHOS, BILITOT, PROT, ALBUMIN in the last 168 hours. No results for input(s): LIPASE, AMYLASE in the last 168 hours. No results for input(s): AMMONIA in the last 168 hours. CBC: Recent Labs  Lab 05/23/21 1308 05/24/21 0700  WBC 7.3 14.8*  HGB 14.1 14.6  HCT 43.1 43.8  MCV 87.2 85.9  PLT 228 255   Cardiac Enzymes: No results for input(s): CKTOTAL, CKMB, CKMBINDEX, TROPONINI in the last 168 hours. BNP: BNP (last 3 results) Recent Labs    09/21/20 1825 10/03/20 1847 05/23/21 1444  BNP 36.3 51.6 60.9    ProBNP (last 3 results) No results for input(s): PROBNP in the last 8760 hours.  CBG: No results for input(s): GLUCAP in the last 168 hours.     Signed:  Desma Maxim MD.  [facility] 05/25/2021, 10:50 AM

## 2021-05-25 NOTE — ED Notes (Signed)
Pt transitioned to a hospital bed.

## 2021-05-26 ENCOUNTER — Telehealth: Payer: Self-pay

## 2021-05-26 NOTE — Telephone Encounter (Signed)
Transition Care Management Unsuccessful Follow-up Telephone Call  Date of discharge and from where:  05/25/2021-[facility]  Attempts:  1st Attempt  Reason for unsuccessful TCM follow-up call:  Left voice message

## 2021-05-27 NOTE — Telephone Encounter (Signed)
Transition Care Management Unsuccessful Follow-up Telephone Call  Date of discharge and from where:  05/25/2021-[facility]  Attempts:  2nd Attempt  Reason for unsuccessful TCM follow-up call:  Left voice message

## 2021-05-30 NOTE — Telephone Encounter (Signed)
Transition Care Management Unsuccessful Follow-up Telephone Call  Date of discharge and from where:  05/25/2021 from [facility]  Attempts:  3rd Attempt  Reason for unsuccessful TCM follow-up call:  Unable to reach patient

## 2021-06-06 NOTE — Telephone Encounter (Signed)
NA

## 2021-06-09 IMAGING — DX DG CHEST 1V PORT
1 series · 1 of 1 positions shown · non-contrast
Comparison: November 14, 2019

CLINICAL DATA: Shortness of breath.

EXAM:
PORTABLE CHEST 1 VIEW

[chest ap]
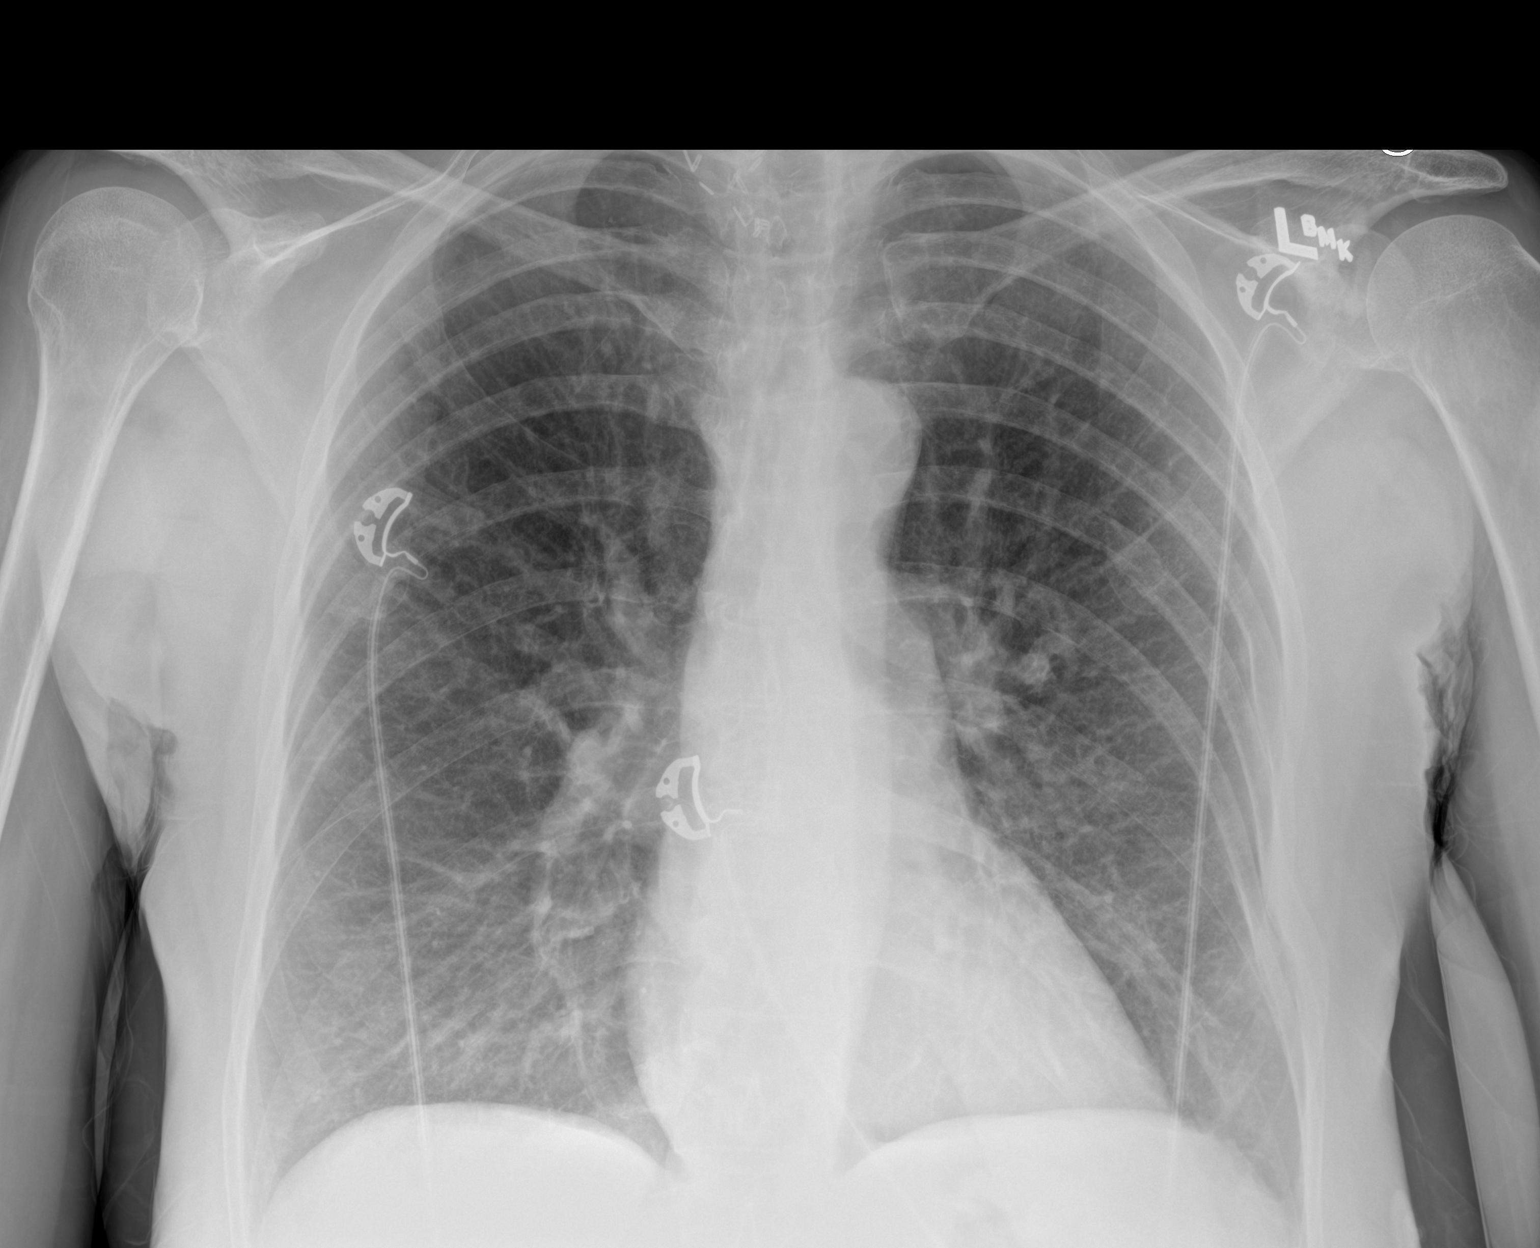

[1 of 1 positions shown; findings below may reference images not displayed]

FINDINGS: The lungs are hyperinflated. Mild, diffuse, chronic appearing
increased lung markings are seen. There is no evidence of acute
infiltrate, pleural effusion or pneumothorax. Radiopaque surgical
clips are seen overlying the medial aspect of the right apex. The
heart size and mediastinal contours are within normal limits.
Chronic sixth, seventh and eighth left rib fractures are seen.
IMPRESSION: Chronic appearing increased lung markings without evidence of acute
or active cardiopulmonary disease.

## 2021-06-23 ENCOUNTER — Telehealth: Payer: Self-pay | Admitting: Podiatry

## 2021-06-23 NOTE — Telephone Encounter (Signed)
[place] Auth Faxed to [facility]

## 2021-07-15 ENCOUNTER — Emergency Department: Payer: No Typology Code available for payment source

## 2021-07-15 ENCOUNTER — Emergency Department
Admission: EM | Admit: 2021-07-15 | Discharge: 2021-07-15 | Disposition: A | Discharge status: Home / Self Care | Payer: No Typology Code available for payment source | Attending: Emergency Medicine | Admitting: Emergency Medicine

## 2021-07-15 ENCOUNTER — Other Ambulatory Visit: Payer: Self-pay

## 2021-07-15 DIAGNOSIS — R0603 Acute respiratory distress: Secondary | ICD-10-CM | POA: Diagnosis present

## 2021-07-15 DIAGNOSIS — F17210 Nicotine dependence, cigarettes, uncomplicated: Secondary | ICD-10-CM | POA: Insufficient documentation

## 2021-07-15 DIAGNOSIS — R0602 Shortness of breath: Secondary | ICD-10-CM

## 2021-07-15 DIAGNOSIS — Z7952 Long term (current) use of systemic steroids: Secondary | ICD-10-CM | POA: Insufficient documentation

## 2021-07-15 DIAGNOSIS — J441 Chronic obstructive pulmonary disease with (acute) exacerbation: Secondary | ICD-10-CM | POA: Diagnosis not present

## 2021-07-15 DIAGNOSIS — I10 Essential (primary) hypertension: Secondary | ICD-10-CM | POA: Insufficient documentation

## 2021-07-15 LAB — BASIC METABOLIC PANEL
Anion gap: 8 (ref 5–15)
BUN: 11 mg/dL (ref 8–23)
CO2: 30 mmol/L (ref 22–32)
Calcium: 8.8 mg/dL — ABNORMAL LOW (ref 8.9–10.3)
Chloride: 104 mmol/L (ref 98–111)
Creatinine, Ser: 0.81 mg/dL (ref 0.44–1.00)
GFR, Estimated: >60 mL/min (ref >60)
Glucose, Bld: 130 mg/dL — ABNORMAL HIGH (ref 70–99)
Potassium: 3.7 mmol/L (ref 3.5–5.1)
Sodium: 142 mmol/L (ref 135–145)

## 2021-07-15 LAB — BRAIN NATRIURETIC PEPTIDE: B Natriuretic Peptide: 35.4 pg/mL (ref 0.0–100.0)

## 2021-07-15 LAB — CBC
HCT: 44.5 % (ref 36.0–46.0)
Hemoglobin: 14.6 g/dL (ref 12.0–15.0)
MCH: 28 pg (ref 26.0–34.0)
MCHC: 32.8 g/dL (ref 30.0–36.0)
MCV: 85.4 fL (ref 80.0–100.0)
Platelets: 316 10*3/uL (ref 150–400)
RBC: 5.21 MIL/uL — ABNORMAL HIGH (ref 3.87–5.11)
RDW: 12.1 % (ref 11.5–15.5)
WBC: 8.1 10*3/uL (ref 4.0–10.5)
nRBC: 0 % (ref 0.0–0.2)

## 2021-07-15 MED ORDER — PREDNISONE 50 MG PO TABS: 50.0000 mg | ORAL_TABLET | Freq: Every day | ORAL | 0 refills | Status: AC

## 2021-07-15 MED ORDER — IPRATROPIUM-ALBUTEROL 0.5-2.5 (3) MG/3ML IN SOLN
6.0000 mL | Freq: Once | RESPIRATORY_TRACT | Status: AC
  Administered 2021-07-15: 6 mL via RESPIRATORY_TRACT
  Filled 2021-07-15: qty 6

## 2021-07-15 MED ORDER — PREDNISONE 20 MG PO TABS
60.0000 mg | ORAL_TABLET | Freq: Once | ORAL | Status: AC
  Administered 2021-07-15: 60 mg via ORAL
  Filled 2021-07-15: qty 3

## 2021-07-15 NOTE — ED Triage Notes (Signed)
EMS called to home for difficulty breathing.  Initial RA sats 80's-- placed on 4l/ [place] and 2 duonebs sats improved to 100%.  Otherwise VS wnl.

## 2021-07-15 NOTE — Discharge Instructions (Signed)
Please make sure you make it to the pulmonologist appointment tomorrow.  Continue using breathing treatments at home, every 4 hours or so.  Pick up your prescription for prednisone steroids to start taking once daily for the next 4 days.  Start taking this tomorrow/Friday.  Return to the ED with any worsening symptoms.

## 2021-07-15 NOTE — ED Provider Notes (Signed)
[facility] Emergency Department Provider Note ____________________________________________   Event Date/Time   First MD Initiated Contact with Patient 07/15/21 1727     (approximate)  I have reviewed the triage vital signs and the nursing notes.  HISTORY  Chief Complaint Respiratory Distress   HPI Cindy Gutierrez is a 60 y.o. femalewho presents to the ED for evaluation of shortness of breath.   Chart review indicates hx COPD and ongoing tobacco abuse, HTN.  Aortic stenosis.  Patient presents to the ED for evaluation of 1 week of shortness of breath and nonproductive cough.  She repeatedly says "I feel like I am suffocating."  She denies any chest pain or pressure, productive cough or increased sputum production.  Denies fevers, syncope, abdominal pain, emesis or other complaints beyond shortness of breath and nonproductive cough. No recent antibiotics or steroids. Reports having an appointment with her pulmonologist, through the [place], tomorrow.  Past Medical History:  Diagnosis Date   COPD (chronic obstructive pulmonary disease) ([place])    Hypertension     Patient Active Problem List   Diagnosis Date Noted   Acute respiratory failure with hypoxia ([place]) 05/23/2021   Aortic valve regurgitation 05/19/2021   Depression 05/19/2021   Primary hyperparathyroidism ([place]) 05/19/2021   Lymphedema, not elsewhere classified 05/19/2021   Major depressive disorder, single episode, moderate ([place]) 05/19/2021   Other chest pain 05/19/2021   Pulmonary nodule 05/19/2021   Viral hepatitis C 04/02/2021   Colon cancer screening    Heart murmur 10/20/2017   Chronic obstructive pulmonary disease, unspecified ([place]) 06/13/2017   Essential hypertension 06/13/2017   Tobacco use 06/13/2017   Herpes simplex 05/31/2017   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 Authorizing Provider  predniSONE (DELTASONE) 50 MG tablet Take 1 tablet (50 mg total) by mouth daily for 4 days. 07/15/21 07/19/21 Yes Vladimir Crofts, MD  albuterol (PROVENTIL HFA;VENTOLIN HFA) 108 (90 Base) MCG/ACT inhaler Inhale 1-2 puffs into the lungs every 6 (six) hours as needed for wheezing or shortness of breath. 06/26/16   Hagler, Jami L, PA-C  Fluticasone-Salmeterol (ADVAIR) 250-50 MCG/DOSE AEPB Inhale 1 puff into the lungs 2 (two) times daily.    [provider]  FUROSEMIDE PO Take 20 mg by mouth daily.    [provider]  levofloxacin (LEVAQUIN) 500 MG tablet Take 1 tablet (500 mg total) by mouth daily. 05/26/21   Wouk, Ailene Rud, MD  predniSONE (DELTASONE) 20 MG tablet Take 2 tablets (40 mg total) by mouth daily with breakfast. 05/26/21   Wouk, Ailene Rud, MD  Tiotropium Bromide Monohydrate (SPIRIVA RESPIMAT IN) Inhale 1 puff into the lungs in the morning and at bedtime. Patient not taking: Reported on 05/23/2021    [provider]  umeclidinium bromide (INCRUSE ELLIPTA) 62.5 MCG/INH AEPB Inhale 1 puff into the lungs daily.    [provider]    Allergies Sulfa antibiotics, Bee pollen, Lisinopril, Losartan, Penicillins, and Tiotropium bromide monohydrate  Family History  Problem Relation Age of Onset   Alzheimer's disease Mother    Heart disease Father     Social History Social History  Tobacco Use   Smoking status: Every Day    Packs/day: 0.50    Types: Cigarettes   Smokeless tobacco: Never  Vaping Use   Vaping Use: Never used  Substance Use Topics   Alcohol use: Yes   Drug use: No    Review of Systems  Constitutional: No fever/chills Eyes: No visual changes. ENT: No  sore throat. Cardiovascular: Denies chest pain. Respiratory: Positive for cough and shortness of breath. Gastrointestinal: No abdominal pain.  No nausea, no vomiting.  No diarrhea.  No constipation. Genitourinary: Negative for dysuria. Musculoskeletal: Negative for back pain. Skin: Negative for rash. Neurological: Negative for headaches, focal weakness or numbness.  ____________________________________________   PHYSICAL EXAM:  VITAL SIGNS: Vitals:   07/15/21 1730 07/15/21 1800  BP: (!) 182/64 (!) 158/79  Pulse: (!) 57 60  Resp:  (!) 22  Temp:    SpO2: 98% 99%    Constitutional: Alert and oriented.  Sitting upright in bed and appears uncomfortable.  Speaking in phrases only. Eyes: Conjunctivae are normal. PERRL. EOMI. Head: Atraumatic. Nose: No congestion/rhinnorhea. Mouth/Throat: Mucous membranes are moist.  Oropharynx non-erythematous. Neck: No stridor. No cervical spine tenderness to palpation. Cardiovascular: Normal rate, regular rhythm. Grossly normal heart sounds.  Good peripheral circulation. Respiratory: Tachypneic to the upper 20s.  No distress.  Speaking in phrases only.  Very poor air movement throughout and diffuse expiratory wheezes without focal features. Gastrointestinal: Soft , nondistended, nontender to palpation. No CVA tenderness. Musculoskeletal: No lower extremity tenderness nor edema.  No joint effusions. No signs of acute trauma. Neurologic:  Normal speech and language. No gross focal neurologic deficits are appreciated. No gait instability noted. Skin:  Skin is warm, dry and intact. No rash noted. Psychiatric: Mood and affect are normal. Speech and behavior are normal. ____________________________________________   LABS (all labs ordered are listed, but only abnormal results are displayed)  Labs Reviewed  CBC - Abnormal; Notable for the following components:      Result Value   RBC 5.21 (*)    All other components within normal limits  BASIC  METABOLIC PANEL - Abnormal; Notable for the following components:   Glucose, Bld 130 (*)    Calcium 8.8 (*)    All other components within normal limits  BRAIN NATRIURETIC PEPTIDE   ____________________________________________  12 Lead EKG  Sinus rhythm, rate of 81 bpm.  Normal axis and intervals.  No STEMI. ____________________________________________  RADIOLOGY  ED MD interpretation: 2 view CXR reviewed by me with hyperinflation without infiltration or PTX  Official radiology report(s): DG Chest 2 View  Result Date: 07/15/2021 CLINICAL DATA:  Shortness of breath EXAM: CHEST - 2 VIEW COMPARISON:  Radiograph 05/23/2021, chest CT 10/03/2020 FINDINGS: Unchanged cardiomediastinal silhouette. There is no focal airspace consolidation. Pulmonary hyperinflation. There is scarring in the left lateral lung base. No large pleural effusion or visible pneumothorax. Thoracic spondylosis. No acute osseous abnormality. IMPRESSION: COPD.  No new airspace disease. Electronically Signed   By: Maurine Simmering M.D.   On: 07/15/2021 14:42    ____________________________________________   PROCEDURES and INTERVENTIONS  Procedure(s) performed (including Critical Care):  .1-3 Lead EKG Interpretation Performed by: Vladimir Crofts, MD Authorized by: Vladimir Crofts, MD     Interpretation: normal     ECG rate:  64   ECG rate assessment: normal     Rhythm: sinus rhythm     Ectopy: none     Conduction: normal    Medications  ipratropium-albuterol (DUONEB) 0.5-2.5 (3) MG/3ML nebulizer solution 6 mL (  6 mLs Nebulization Given 07/15/21 1806)  predniSONE (DELTASONE) tablet 60 mg (60 mg Oral Given 07/15/21 1806)    ____________________________________________   MDM / ED COURSE   60 year old female presents to the ED with evidence of a COPD exacerbation amenable to trial of outpatient management.  No hypoxia or tachycardia.  She appears quite uncomfortable prior to breathing treatments with significant wheezing  and decreased airflow.  Clinically improves with duo nebs and steroids.  Blood work is unremarkable.  EKG is nonischemic.  CXR without PTX or infiltrate.  I advised to repeat breathing treatments and possible medical sedation admission, but she declines indicate that she has to go home.  She has close follow-up with her pulmonologist tomorrow.  Return precautions discussed.  Clinical Course as of 07/15/21 1908  Thu Jul 15, 2021  1752 During my evaluation patient reports she "has got to go home tonight." She has an appt with her [place] pulmonologist tomorrow AM [DS]  [place].  Patient appears much more comfortable.  Still some wheezing on auscultation.  Advised my recommendation for repeat breathing treatment, but she reports that she is wants to go home.  We discussed the possibility of her worsening, and she indicates that she does not care.  Again demanding discharge.  We discussed the importance of following up with pulmonology as an outpatient.  Return precautions discussed. [DS]    Clinical Course User Index [DS] Vladimir Crofts, MD    ____________________________________________   FINAL CLINICAL IMPRESSION(S) / ED DIAGNOSES  Final diagnoses:  COPD exacerbation ([place])  Shortness of breath     ED Discharge Orders          Ordered    predniSONE (DELTASONE) 50 MG tablet  Daily        07/15/21 1907             Cindy Gutierrez   Note:  This document was prepared using Dragon voice recognition software and may include unintentional dictation errors.    Vladimir Crofts, MD 07/15/21 [phone]

## 2021-07-15 NOTE — ED Triage Notes (Signed)
Pt here with respiratory distress for about a week. Pt states that she has COPD but does not normally wear oxygen at home. Pt had sats in the 80s on RA, pt now on 4L BNC with sats at 97%. Pt in NAD in triage.

## 2021-08-18 ENCOUNTER — Encounter (INDEPENDENT_AMBULATORY_CARE_PROVIDER_SITE_OTHER): Payer: Self-pay

## 2021-08-18 ENCOUNTER — Ambulatory Visit: Payer: No Typology Code available for payment source | Admitting: Podiatry

## 2021-08-18 ENCOUNTER — Other Ambulatory Visit: Payer: Self-pay

## 2021-09-29 ENCOUNTER — Ambulatory Visit: Payer: Self-pay | Admitting: Podiatry

## 2021-12-15 IMAGING — DX DG CHEST 1V PORT
1 series · 1 of 1 positions shown · non-contrast
Comparison: 10/03/2020

CLINICAL DATA: Dyspnea

EXAM:
PORTABLE CHEST 1 VIEW

[chest ap]
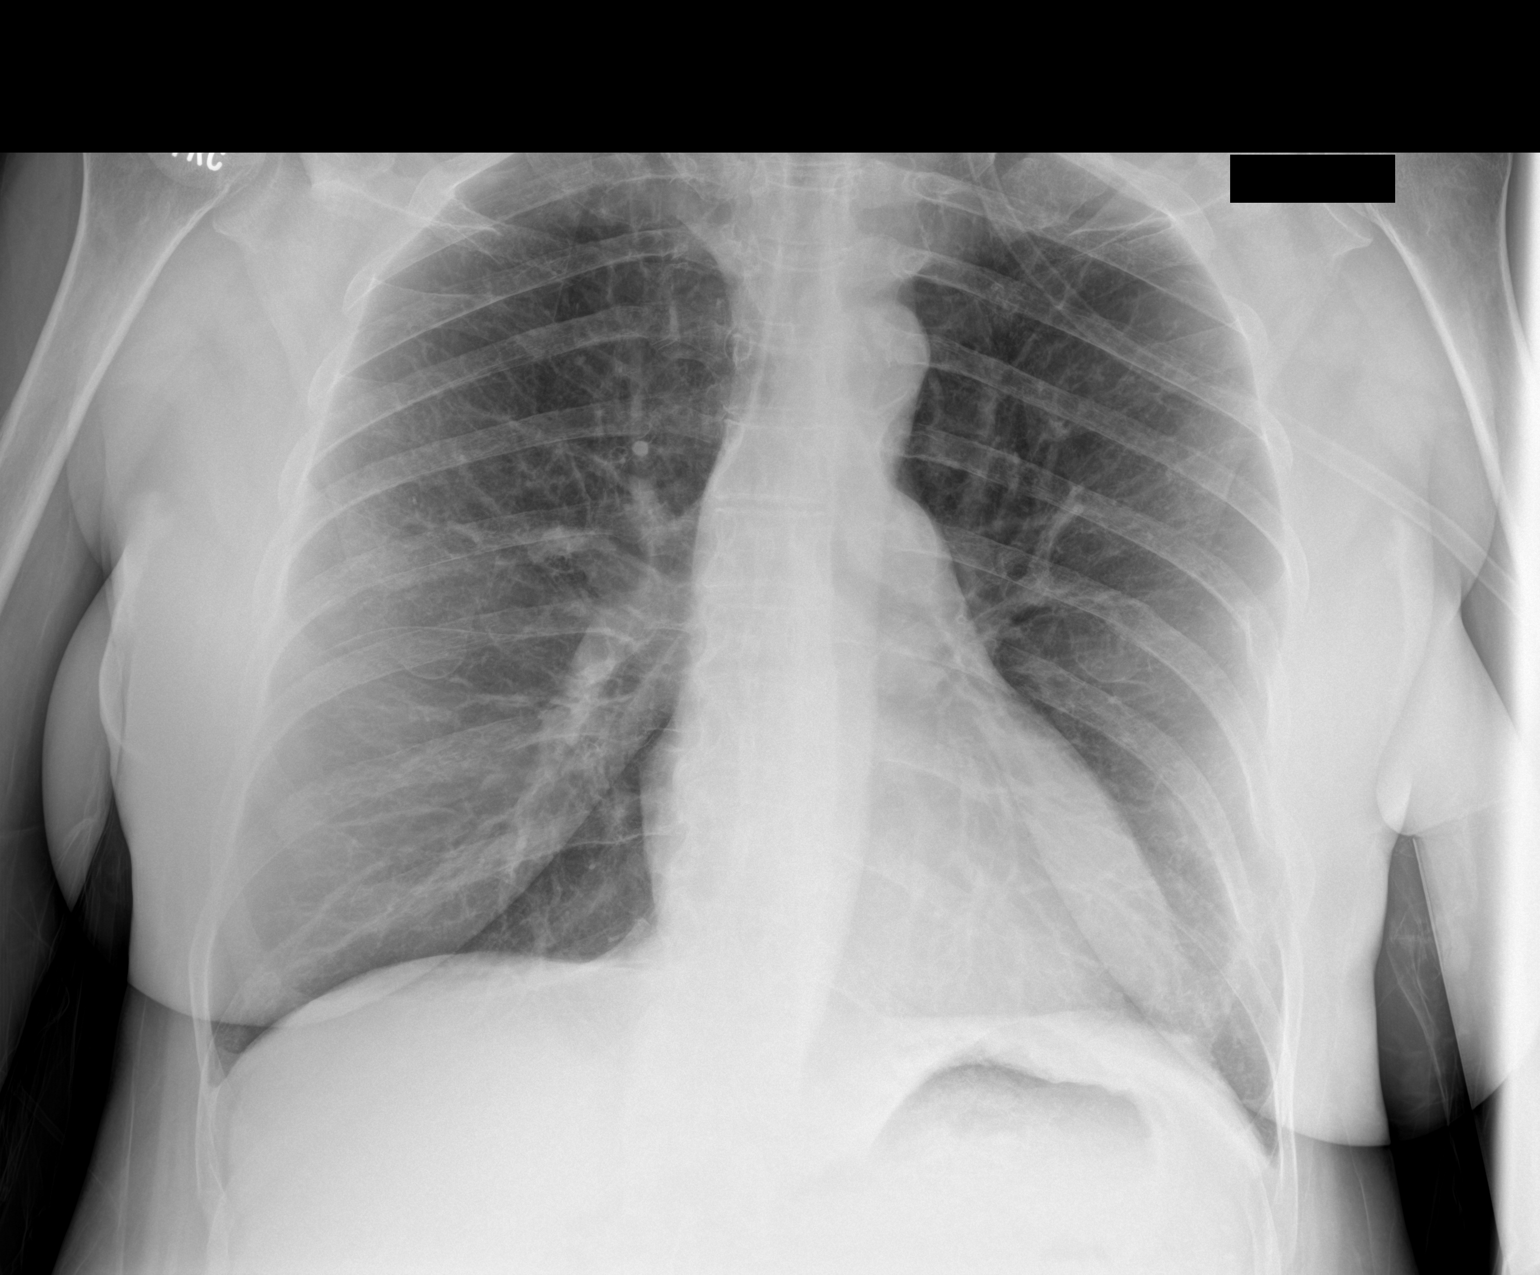

[1 of 1 positions shown; findings below may reference images not displayed]

FINDINGS: Hyperinflation with emphysema and bronchitic changes. No
consolidation, pleural effusion, or pneumothorax.
IMPRESSION: Hyperinflation with emphysematous disease and mild bronchitic
changes.

## 2022-08-30 ENCOUNTER — Encounter: Payer: No Typology Code available for payment source | Attending: Registered Nurse | Admitting: *Deleted

## 2022-08-30 ENCOUNTER — Encounter: Payer: Self-pay | Admitting: *Deleted

## 2022-08-30 DIAGNOSIS — J449 Chronic obstructive pulmonary disease, unspecified: Secondary | ICD-10-CM

## 2022-08-30 DIAGNOSIS — R0600 Dyspnea, unspecified: Secondary | ICD-10-CM

## 2022-08-30 NOTE — Progress Notes (Signed)
Virtual orientation call completed today. shehas an appointment on Date: 09/08/2022  for EP eval and gym Orientation.  Documentation of diagnosis can be found in Media Tab Date: 12/7 .  Cindy Gutierrez is a current tobacco user. Intervention for tobacco cessation was provided at the initial medical review. She was asked about readiness to quit and reported she wants to quit and is down to 2 cigarettes a day.  . Patient was advised and educated about tobacco cessation using combination therapy, tobacco cessation classes, quit line, and quit smoking apps. Patient demonstrated understanding of this material. Staff will continue to provide encouragement and follow up with the patient throughout the program.

## 2022-09-08 ENCOUNTER — Encounter: Payer: No Typology Code available for payment source | Attending: Registered Nurse

## 2022-09-08 VITALS — Ht 70.0 in | Wt 165.3 lb

## 2022-09-08 DIAGNOSIS — J449 Chronic obstructive pulmonary disease, unspecified: Secondary | ICD-10-CM | POA: Diagnosis not present

## 2022-09-08 DIAGNOSIS — R0600 Dyspnea, unspecified: Secondary | ICD-10-CM | POA: Insufficient documentation

## 2022-09-08 NOTE — Progress Notes (Signed)
Pulmonary Individual Treatment Plan  Patient Details  Name: Cindy Gutierrez MRN: [id number] Date of Birth: 1960/10/13 Referring Provider:   Flowsheet Row Pulmonary Rehab from 09/08/2022 in [facility] Cardiac and Pulmonary Rehab  Referring Provider Bobbye Charleston MD       Initial Encounter Date:  Flowsheet Row Pulmonary Rehab from 09/08/2022 in [facility] Cardiac and Pulmonary Rehab  Date 09/08/22       Visit Diagnosis: Chronic obstructive pulmonary disease, unspecified COPD type ([place])  Dyspnea, unspecified type  Patient's Home Medications on Admission:  Current Outpatient Medications:    albuterol (PROVENTIL HFA;VENTOLIN HFA) 108 (90 Base) MCG/ACT inhaler, Inhale 1-2 puffs into the lungs every 6 (six) hours as needed for wheezing or shortness of breath., Disp: 1 Inhaler, Rfl: 0   albuterol (PROVENTIL) (2.5 MG/3ML) 0.083% nebulizer solution, Take 2.5 mg by nebulization every 6 (six) hours as needed for wheezing or shortness of breath., Disp: , Rfl:    amLODipine (NORVASC) 5 MG tablet, Take 5 mg by mouth daily., Disp: , Rfl:    Budeson-Glycopyrrol-Formoterol (BREZTRI AEROSPHERE) 160-9-4.8 MCG/ACT AERO, Inhale 2 puffs into the lungs 2 times daily at 12 noon and 4 pm., Disp: , Rfl:    calcium carbonate (TUMS - DOSED IN MG ELEMENTAL CALCIUM) 500 MG chewable tablet, Chew 1 tablet by mouth daily. As needed, Disp: , Rfl:    calcium citrate (CALCITRATE - DOSED IN MG ELEMENTAL CALCIUM) 950 (200 Ca) MG tablet, Take 200 mg of elemental calcium by mouth daily., Disp: , Rfl:    chlorthalidone (HYGROTON) 25 MG tablet, Take 12.5 mg by mouth daily., Disp: , Rfl:    Fluticasone-Salmeterol (ADVAIR) 250-50 MCG/DOSE AEPB, Inhale 1 puff into the lungs 2 (two) times daily. (Patient not taking: Reported on 08/30/2022), Disp: , Rfl:    FUROSEMIDE PO, Take 20 mg by mouth daily. (Patient not taking: Reported on 08/30/2022), Disp: , Rfl:    guaiFENesin (MUCINEX) 600 MG 12 hr tablet, Take by mouth 2 (two) times daily as  needed., Disp: , Rfl:    guaiFENesin (ROBITUSSIN) 100 MG/5ML liquid, Take 5 mLs by mouth every 4 (four) hours as needed for cough or to loosen phlegm., Disp: , Rfl:    levofloxacin (LEVAQUIN) 500 MG tablet, Take 1 tablet (500 mg total) by mouth daily. (Patient not taking: Reported on 08/30/2022), Disp: 3 tablet, Rfl: 0   OVER THE COUNTER MEDICATION, Take 1 Dose by mouth daily. IRISH SEA MOSS SUPPLEMENT, Disp: , Rfl:    predniSONE (DELTASONE) 20 MG tablet, Take 2 tablets (40 mg total) by mouth daily with breakfast. (Patient not taking: Reported on 08/30/2022), Disp: 6 tablet, Rfl: 0   Tiotropium Bromide Monohydrate (SPIRIVA RESPIMAT IN), Inhale 1 puff into the lungs in the morning and at bedtime. (Patient not taking: Reported on 05/23/2021), Disp: , Rfl:    umeclidinium bromide (INCRUSE ELLIPTA) 62.5 MCG/INH AEPB, Inhale 1 puff into the lungs daily. (Patient not taking: Reported on 08/30/2022), Disp: , Rfl:   Past Medical History: Past Medical History:  Diagnosis Date   COPD (chronic obstructive pulmonary disease) ([place])    Hypertension     Tobacco Use: Social History   Tobacco Use  Smoking Status Every Day   Packs/day: 0.25   Years: 45.00   Total pack years: 11.25   Types: Cigarettes  Smokeless Tobacco Never    Labs: Review Flowsheet       Latest Ref Rng & Units 10/03/2020 03/29/2021 05/23/2021 05/24/2021  Labs for ITP Cardiac and Pulmonary Rehab  Hemoglobin A1c  4.8 - 5.6 % - - - 5.9   PH, Arterial 7.350 - 7.450 - - 7.47  -  PCO2 arterial 32.0 - 48.0 mmHg - - 31  -  Bicarbonate 20.0 - 28.0 mmol/L 28.2  27.9  22.6  -  Acid-base deficit 0.0 - 2.0 mmol/L - - 0.2  -  O2 Saturation % 79.2  69.0  99.2  -     Pulmonary Assessment Scores:  Pulmonary Assessment Scores     Row Name 09/08/22 1240         ADL UCSD   ADL Phase Entry     SOB Score total 72     Rest 0     Walk 3     Stairs 5     Bath 4     Dress 0     Shop 3       CAT Score   CAT Score 24       mMRC Score    mMRC Score 3              UCSD: Self-administered rating of dyspnea associated with activities of daily living (ADLs) 6-point scale (0 = "not at all" to 5 = "maximal or unable to do because of breathlessness")  Scoring Scores range from 0 to 120.  Minimally important difference is 5 units  CAT: CAT can identify the health impairment of COPD patients and is better correlated with disease progression.  CAT has a scoring range of zero to 40. The CAT score is classified into four groups of low (less than 10), medium (10 - 20), high (21-30) and very high (31-40) based on the impact level of disease on health status. A CAT score over 10 suggests significant symptoms.  A worsening CAT score could be explained by an exacerbation, poor medication adherence, poor inhaler technique, or progression of COPD or comorbid conditions.  CAT MCID is 2 points  mMRC: mMRC (Modified Medical Research Council) Dyspnea Scale is used to assess the degree of baseline functional disability in patients of respiratory disease due to dyspnea. No minimal important difference is established. A decrease in score of 1 point or greater is considered a positive change.   Pulmonary Function Assessment:   Exercise Target Goals: Exercise Program Goal: Individual exercise prescription set using results from initial 6 min walk test and THRR while considering  patient's activity barriers and safety.   Exercise Prescription Goal: Initial exercise prescription builds to 30-45 minutes a day of aerobic activity, 2-3 days per week.  Home exercise guidelines will be given to patient during program as part of exercise prescription that the participant will acknowledge.  Education: Aerobic Exercise: - Group verbal and visual presentation on the components of exercise prescription. Introduces F.I.T.T principle from ACSM for exercise prescriptions.  Reviews F.I.T.T. principles of aerobic exercise including progression. Written material  given at graduation.   Education: Resistance Exercise: - Group verbal and visual presentation on the components of exercise prescription. Introduces F.I.T.T principle from ACSM for exercise prescriptions  Reviews F.I.T.T. principles of resistance exercise including progression. Written material given at graduation.    Education: Exercise & Equipment Safety: - Individual verbal instruction and demonstration of equipment use and safety with use of the equipment. Flowsheet Row Pulmonary Rehab from 09/08/2022 in [facility] Cardiac and Pulmonary Rehab  Date 09/08/22  Educator NT  Instruction Review Code 1- Verbalizes Understanding       Education: Exercise Physiology & General Exercise Guidelines: - Group verbal and written instruction  with models to review the exercise physiology of the cardiovascular system and associated critical values. Provides general exercise guidelines with specific guidelines to those with heart or lung disease.    Education: Flexibility, Balance, Mind/Body Relaxation: - Group verbal and visual presentation with interactive activity on the components of exercise prescription. Introduces F.I.T.T principle from ACSM for exercise prescriptions. Reviews F.I.T.T. principles of flexibility and balance exercise training including progression. Also discusses the mind body connection.  Reviews various relaxation techniques to help reduce and manage stress (i.e. Deep breathing, progressive muscle relaxation, and visualization). Balance handout provided to take home. Written material given at graduation.   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 HR 94 bpm     Max Ex. BP 158/82     2 Minute Post BP 146/76       Interval HR   1 Minute HR 84     2 Minute HR 88     3 Minute HR 91     4 Minute HR 92     5 Minute HR 93     6 Minute HR 94     2 Minute Post HR 75     Interval Heart Rate? Yes       Interval Oxygen   Interval Oxygen? Yes     Baseline Oxygen Saturation % 97 %     1 Minute Oxygen Saturation % 94 %     1 Minute Liters of Oxygen 0 L  RA     2 Minute Oxygen Saturation % 94 %     2 Minute Liters of Oxygen 0 L  RA     3 Minute Oxygen Saturation % 94 %     3 Minute Liters of Oxygen 0 L  RA     4 Minute Oxygen Saturation % 95 %     4 Minute Liters of Oxygen 0 L  RA     5 Minute Oxygen Saturation % 95 %     5 Minute Liters of Oxygen 0 L  RA     6 Minute Oxygen Saturation % 92 %     6 Minute Liters of Oxygen 0 L  RA     2 Minute Post Oxygen Saturation % 98 %     2 Minute Post Liters of Oxygen 0 L  RA             Oxygen Initial Assessment:  Oxygen Initial Assessment - 08/30/22 1023       Home Oxygen   Home Oxygen Device None    Home Exercise Oxygen Prescription None    Home Resting Oxygen Prescription None      Intervention   Short Term Goals To learn and understand importance of maintaining oxygen saturations>88%;To learn and demonstrate proper pursed lip breathing techniques or other breathing techniques. ;To learn and demonstrate proper use of respiratory medications;To learn and understand importance of monitoring SPO2 with pulse oximeter and demonstrate accurate use of the pulse oximeter.    Long  Term Goals Maintenance of O2  saturations>88%;Exhibits proper breathing techniques, such as pursed lip breathing or other method taught during program session;Compliance with respiratory medication;Demonstrates proper use of MDI's             Oxygen Re-Evaluation:   Oxygen Discharge (Final Oxygen Re-Evaluation):   Initial Exercise Prescription:  Initial Exercise Prescription - 09/08/22 1300       Date of Initial Exercise RX and Referring Provider   Date 09/08/22    Referring Provider Bobbye Charleston MD      Oxygen   Maintain Oxygen Saturation 88% or higher      Treadmill   MPH 1.8    Grade 1    Minutes 15    METs 2.63      NuStep   Level 2    SPM 80    Minutes 15    METs 3.21      REL-XR   Level 1    Watts 29    Speed 50    Minutes 15    METs 3.21      T5 Nustep   Level 1    SPM 80    Minutes 15    METs 3.21      Prescription Details   Frequency (times per week) 2    Duration Progress to 30 minutes of continuous aerobic without signs/symptoms of physical distress      Intensity   THRR 40-80% of Max Heartrate 100-138    Ratings of Perceived Exertion 11-13    Perceived Dyspnea 0-4      Progression   Progression Continue to progress workloads to maintain intensity without signs/symptoms of physical distress.      Resistance Training   Training Prescription Yes    Weight 3 lb    Reps 10-15             Perform Capillary Blood Glucose checks as needed.  Exercise Prescription Changes:   Exercise Prescription Changes     Row Name 09/08/22 1400             Response to Exercise   Blood Pressure (Admit) 130/66       Blood Pressure (Exercise) 158/82       Blood Pressure (Exit) 146/76       Heart Rate (Admit) 62 bpm       Heart Rate (Exercise) 94 bpm       Heart Rate (Exit) 75 bpm       Oxygen Saturation (Admit) 97 %       Oxygen Saturation (Exercise) 92 %       Oxygen Saturation (Exit) 98 %       Rating of Perceived Exertion (Exercise) 11       Perceived Dyspnea  (Exercise) 2       Symptoms  SOB, right leg fatigue       Comments 6MWT Results                Exercise Comments:   Exercise Goals and Review:   Exercise Goals     Row Name 09/08/22 1357             Exercise Goals   Increase Physical Activity Yes       Intervention Provide advice, education, support and counseling about physical activity/exercise needs.;Develop an individualized exercise prescription for aerobic and resistive training based on initial evaluation findings, risk stratification, comorbidities and participant's personal goals.       Expected Outcomes Short Term: Attend rehab on a regular basis to increase amount of physical activity.;Long Term: Add in home exercise to make exercise part of routine and to increase amount of physical activity.;Long Term: Exercising regularly at least 3-5 days a week.       Increase Strength and Stamina Yes       Intervention Provide advice, education, support and counseling about physical activity/exercise needs.;Develop an individualized exercise prescription for aerobic and resistive training based on initial evaluation findings, risk stratification, comorbidities and participant's personal goals.       Expected Outcomes Short Term: Increase workloads from initial exercise prescription for resistance, speed, and METs.;Short Term: Perform resistance training exercises routinely during rehab and add in resistance training at home;Long Term: Improve cardiorespiratory fitness, muscular endurance and strength as measured by increased METs and functional capacity (6MWT)       Able to understand and use rate of perceived exertion (RPE) scale Yes       Intervention Provide education and explanation on how to use RPE scale       Expected Outcomes Long Term:  Able to use RPE to guide intensity level when exercising independently;Short Term: Able to use RPE daily in rehab to express subjective intensity level       Able to understand and use Dyspnea  scale Yes       Intervention Provide education and explanation on how to use Dyspnea scale       Expected Outcomes Short Term: Able to use Dyspnea scale daily in rehab to express subjective sense of shortness of breath during exertion;Long Term: Able to use Dyspnea scale to guide intensity level when exercising independently       Knowledge and understanding of Target Heart Rate Range (THRR) Yes       Intervention Provide education and explanation of THRR including how the numbers were predicted and where they are located for reference       Expected Outcomes Short Term: Able to state/look up THRR;Long Term: Able to use THRR to govern intensity when exercising independently;Short Term: Able to use daily as guideline for intensity in rehab       Able to check pulse independently Yes       Intervention Provide education and demonstration on how to check pulse in carotid and radial arteries.;Review the importance of being able to check your own pulse for safety during independent exercise       Expected Outcomes Short Term: Able to explain why pulse checking is important during independent exercise;Long Term: Able to check pulse independently and accurately       Understanding of Exercise Prescription Yes       Intervention Provide education, explanation, and written materials on patient's individual exercise prescription       Expected Outcomes Short Term: Able to explain program exercise  prescription;Long Term: Able to explain home exercise prescription to exercise independently                Exercise Goals Re-Evaluation :   Discharge Exercise Prescription (Final Exercise Prescription Changes):  Exercise Prescription Changes - 09/08/22 1400       Response to Exercise   Blood Pressure (Admit) 130/66    Blood Pressure (Exercise) 158/82    Blood Pressure (Exit) 146/76    Heart Rate (Admit) 62 bpm    Heart Rate (Exercise) 94 bpm    Heart Rate (Exit) 75 bpm    Oxygen Saturation (Admit) 97  %    Oxygen Saturation (Exercise) 92 %    Oxygen Saturation (Exit) 98 %    Rating of Perceived Exertion (Exercise) 11    Perceived Dyspnea (Exercise) 2    Symptoms SOB, right leg fatigue    Comments 6MWT Results             Nutrition:  Target Goals: Understanding of nutrition guidelines, daily intake of sodium '1500mg'$ , cholesterol '200mg'$ , calories 30% from fat and 7% or less from saturated fats, daily to have 5 or more servings of fruits and vegetables.  Education: All About Nutrition: -Group instruction provided by verbal, written material, interactive activities, discussions, models, and posters to present general guidelines for heart healthy nutrition including fat, fiber, MyPlate, the role of sodium in heart healthy nutrition, utilization of the nutrition label, and utilization of this knowledge for meal planning. Follow up email sent as well. Written material given at graduation.   Biometrics:  Pre Biometrics - 09/08/22 1358       Pre Biometrics   Height '5\' 10"'$  (1.778 m)    Weight 165 lb 4.8 oz (75 kg)    Waist Circumference 36.5 inches    Hip Circumference 41 inches    Waist to Hip Ratio 0.89 %    BMI (Calculated) 23.72    Single Leg Stand 3.5 seconds   R             Nutrition Therapy Plan and Nutrition Goals:  Nutrition Therapy & Goals - 09/08/22 1410       Intervention Plan   Intervention Prescribe, educate and counsel regarding individualized specific dietary modifications aiming towards targeted core components such as weight, hypertension, lipid management, diabetes, heart failure and other comorbidities.    Expected Outcomes Short Term Goal: Understand basic principles of dietary content, such as calories, fat, sodium, cholesterol and nutrients.;Short Term Goal: A plan has been developed with personal nutrition goals set during dietitian appointment.;Long Term Goal: Adherence to prescribed nutrition plan.             Nutrition  Assessments:  MEDIFICTS Score Key: ?70 Need to make dietary changes  40-70 Heart Healthy Diet ? 40 Therapeutic Level Cholesterol Diet  Flowsheet Row Pulmonary Rehab from 09/08/2022 in [facility] Cardiac and Pulmonary Rehab  Picture Your Plate Total Score on Admission 42      Picture Your Plate Scores: <38 Unhealthy dietary pattern with much room for improvement. 41-50 Dietary pattern unlikely to meet recommendations for good health and room for improvement. 51-60 More healthful dietary pattern, with some room for improvement.  >60 Healthy dietary pattern, although there may be some specific behaviors that could be improved.   Nutrition Goals Re-Evaluation:   Nutrition Goals Discharge (Final Nutrition Goals Re-Evaluation):   Psychosocial: Target Goals: Acknowledge presence or absence of significant depression and/or stress, maximize coping skills, provide positive support system. Participant is able  to verbalize types and ability to use techniques and skills needed for reducing stress and depression.   Education: Stress, Anxiety, and Depression - Group verbal and visual presentation to define topics covered.  Reviews how body is impacted by stress, anxiety, and depression.  Also discusses healthy ways to reduce stress and to treat/manage anxiety and depression.  Written material given at graduation.   Education: Sleep Hygiene -Provides group verbal and written instruction about how sleep can affect your health.  Define sleep hygiene, discuss sleep cycles and impact of sleep habits. Review good sleep hygiene tips.    Initial Review & Psychosocial Screening:  Initial Psych Review & Screening - 08/30/22 1023       Initial Review   Current issues with None Identified      Family Dynamics   Good Support System? Yes   2 sons live with her     Barriers   Psychosocial barriers to participate in program There are no identifiable barriers or psychosocial needs.      Screening  Interventions   Interventions Encouraged to exercise;To provide support and resources with identified psychosocial needs;Provide feedback about the scores to participant    Expected Outcomes Short Term goal: Utilizing psychosocial counselor, staff and physician to assist with identification of specific Stressors or current issues interfering with healing process. Setting desired goal for each stressor or current issue identified.;Long Term Goal: Stressors or current issues are controlled or eliminated.;Short Term goal: Identification and review with participant of any Quality of Life or Depression concerns found by scoring the questionnaire.;Long Term goal: The participant improves quality of Life and PHQ9 Scores as seen by post scores and/or verbalization of changes             Quality of Life Scores:  Scores of 19 and below usually indicate a poorer quality of life in these areas.  A difference of  2-3 points is a clinically meaningful difference.  A difference of 2-3 points in the total score of the Quality of Life Index has been associated with significant improvement in overall quality of life, self-image, physical symptoms, and general health in studies assessing change in quality of life.  PHQ-9: Review Flowsheet       09/08/2022  Depression screen PHQ 2/9  Decreased Interest 2  Down, Depressed, Hopeless 3  PHQ - 2 Score 5  Altered sleeping 2  Tired, decreased energy 3  Change in appetite 2  Feeling bad or failure about yourself  0  Trouble concentrating 0  Moving slowly or fidgety/restless 0  Suicidal thoughts 0  PHQ-9 Score 12  Difficult doing work/chores Somewhat difficult   Interpretation of Total Score  Total Score Depression Severity:  1-4 = Minimal depression, 5-9 = Mild depression, 10-14 = Moderate depression, 15-19 = Moderately severe depression, 20-27 = Severe depression   Psychosocial Evaluation and Intervention:  Psychosocial Evaluation - 08/30/22 1040        Psychosocial Evaluation & Interventions   Comments Angelicia has no barriers to attending the program. She lives with her 2 grown sons. They are her support. She wants to improve her COPD symptoms. She is still smoking about 2 cigarettes a day. She was given info on the [place] Quitline and she ahs access to any meds from her [place] caregiver.  She will schedule for 2 days a week ,secondary to transportation needs.    Expected Outcomes [facility] is able to attend her scheduled sessions. She is able to set a Quit date and  complete her tobacco cessation. She is able to see progression with her exercise and a decrease in her symptoms.    Continue Psychosocial Services  Follow up required by staff             Psychosocial Re-Evaluation:   Psychosocial Discharge (Final Psychosocial Re-Evaluation):   Education: Education Goals: Education classes will be provided on a weekly basis, covering required topics. Participant will state understanding/return demonstration of topics presented.  Learning Barriers/Preferences:   General Pulmonary Education Topics:  Infection Prevention: - Provides verbal and written material to individual with discussion of infection control including proper hand washing and proper equipment cleaning during exercise session. Flowsheet Row Pulmonary Rehab from 09/08/2022 in [facility] Cardiac and Pulmonary Rehab  Date 09/08/22  Educator NT  Instruction Review Code 1- Verbalizes Understanding       Falls Prevention: - Provides verbal and written material to individual with discussion of falls prevention and safety. Flowsheet Row Pulmonary Rehab from 09/08/2022 in [facility] Cardiac and Pulmonary Rehab  Date 08/30/22  Educator SB  Instruction Review Code 1- Verbalizes Understanding       Chronic Lung Disease Review: - Group verbal instruction with posters, models, PowerPoint presentations and videos,  to review new updates, new respiratory medications, new advancements in procedures  and treatments. Providing information on websites and "800" numbers for continued self-education. Includes information about supplement oxygen, available portable oxygen systems, continuous and intermittent flow rates, oxygen safety, concentrators, and Medicare reimbursement for oxygen. Explanation of Pulmonary Drugs, including class, frequency, complications, importance of spacers, rinsing mouth after steroid MDI's, and proper cleaning methods for nebulizers. Review of basic lung anatomy and physiology related to function, structure, and complications of lung disease. Review of risk factors. Discussion about methods for diagnosing sleep apnea and types of masks and machines for OSA. Includes a review of the use of types of environmental controls: home humidity, furnaces, filters, dust mite/pet prevention, HEPA vacuums. Discussion about weather changes, air quality and the benefits of nasal washing. Instruction on Warning signs, infection symptoms, calling MD promptly, preventive modes, and value of vaccinations. Review of effective airway clearance, coughing and/or vibration techniques. Emphasizing that all should Create an Action Plan. Written material given at graduation.   AED/CPR: - Group verbal and written instruction with the use of models to demonstrate the basic use of the AED with the basic ABC's of resuscitation.    Anatomy and Cardiac Procedures: - Group verbal and visual presentation and models provide information about basic cardiac anatomy and function. Reviews the testing methods done to diagnose heart disease and the outcomes of the test results. Describes the treatment choices: Medical Management, Angioplasty, or Coronary Bypass Surgery for treating various heart conditions including Myocardial Infarction, Angina, Valve Disease, and Cardiac Arrhythmias.  Written material given at graduation.   Medication Safety: - Group verbal and visual instruction to review commonly prescribed  medications for heart and lung disease. Reviews the medication, class of the drug, and side effects. Includes the steps to properly store meds and maintain the prescription regimen.  Written material given at graduation.   Other: -Provides group and verbal instruction on various topics (see comments)   Knowledge Questionnaire Score:  Knowledge Questionnaire Score - 09/08/22 1239       Knowledge Questionnaire Score   Pre Score 16/18              Core Components/Risk Factors/Patient Goals at Admission:  Personal Goals and Risk Factors at Admission - 09/08/22 1239  Core Components/Risk Factors/Patient Goals on Admission    Weight Management Yes    Intervention Weight Management: Develop a combined nutrition and exercise program designed to reach desired caloric intake, while maintaining appropriate intake of nutrient and fiber, sodium and fats, and appropriate energy expenditure required for the weight goal.;Weight Management: Provide education and appropriate resources to help participant work on and attain dietary goals.;Weight Management/Obesity: Establish reasonable short term and long term weight goals.    Admit Weight 165 lb 4.8 oz (75 kg)    Goal Weight: Short Term 160 lb (72.6 kg)    Goal Weight: Long Term 150 lb (68 kg)    Expected Outcomes Short Term: Continue to assess and modify interventions until short term weight is achieved;Long Term: Adherence to nutrition and physical activity/exercise program aimed toward attainment of established weight goal;Weight Loss: Understanding of general recommendations for a balanced deficit meal plan, which promotes 1-2 lb weight loss per week and includes a negative energy balance of [phone] kcal/d    Tobacco Cessation Yes    Number of packs per day Ayleen is a current tobacco user. Intervention for tobacco cessation was provided at the initial medical review. She was asked about readiness to quit and reported she wants to quit and is  down to 2 cigarettes a day.  . Patient was advised and educated about tobacco cessation using combination therapy, tobacco cessation classes, quit line, and quit smoking apps. Patient demonstrated understanding of this material. Staff will continue to provide encouragement and follow up with the patient throughout the program.    Intervention Assist the participant in steps to quit. Provide individualized education and counseling about committing to Tobacco Cessation, relapse prevention, and pharmacological support that can be provided by physician.;Advice worker, assist with locating and accessing local/national Quit Smoking programs, and support quit date choice.    Expected Outcomes Short Term: Will demonstrate readiness to quit, by selecting a quit date.;Short Term: Will quit all tobacco product use, adhering to prevention of relapse plan.;Long Term: Complete abstinence from all tobacco products for at least 12 months from quit date.    Improve shortness of breath with ADL's Yes    Intervention Provide education, individualized exercise plan and daily activity instruction to help decrease symptoms of SOB with activities of daily living.    Expected Outcomes Short Term: Improve cardiorespiratory fitness to achieve a reduction of symptoms when performing ADLs;Long Term: Be able to perform more ADLs without symptoms or delay the onset of symptoms    Increase knowledge of respiratory medications and ability to use respiratory devices properly  Yes    Intervention Provide education and demonstration as needed of appropriate use of medications, inhalers, and oxygen therapy.    Expected Outcomes Short Term: Achieves understanding of medications use. Understands that oxygen is a medication prescribed by physician. Demonstrates appropriate use of inhaler and oxygen therapy.;Long Term: Maintain appropriate use of medications, inhalers, and oxygen therapy.    Hypertension Yes    Intervention  Provide education on lifestyle modifcations including regular physical activity/exercise, weight management, moderate sodium restriction and increased consumption of fresh fruit, vegetables, and low fat dairy, alcohol moderation, and smoking cessation.;Monitor prescription use compliance.    Expected Outcomes Short Term: Continued assessment and intervention until BP is < 140/48m HG in hypertensive participants. < 130/878mHG in hypertensive participants with diabetes, heart failure or chronic kidney disease.;Long Term: Maintenance of blood pressure at goal levels.  Education:Diabetes - Individual verbal and written instruction to review signs/symptoms of diabetes, desired ranges of glucose level fasting, after meals and with exercise. Acknowledge that pre and post exercise glucose checks will be done for 3 sessions at entry of program.   Know Your Numbers and Heart Failure: - Group verbal and visual instruction to discuss disease risk factors for cardiac and pulmonary disease and treatment options.  Reviews associated critical values for Overweight/Obesity, Hypertension, Cholesterol, and Diabetes.  Discusses basics of heart failure: signs/symptoms and treatments.  Introduces Heart Failure Zone chart for action plan for heart failure.  Written material given at graduation.   Core Components/Risk Factors/Patient Goals Review:    Core Components/Risk Factors/Patient Goals at Discharge (Final Review):    ITP Comments:  ITP Comments     Row Name 08/30/22 1046 09/08/22 1233         ITP Comments Virtual orientation call completed today. shehas an appointment on Date: 09/08/2022  for EP eval and gym Orientation.  Documentation of diagnosis can be found in Media Tab Date: 12/7 .   Mckinleigh is a current tobacco user. Intervention for tobacco cessation was provided at the initial medical review. She was asked about readiness to quit and reported she wants to quit and is down to 2  cigarettes a day.  . Patient was advised and educated about tobacco cessation using combination therapy, tobacco cessation classes, quit line, and quit smoking apps. Patient demonstrated understanding of this material. Staff will continue to provide encouragement and follow up with the patient throughout the program. Completed 6MWT and gym orientation. Initial ITP created and sent for review to Dr. Ottie Glazier, Medical Director.               Comments: Initial ITP

## 2022-09-08 NOTE — Patient Instructions (Signed)
Patient Instructions  Patient Details  Name: Cindy Gutierrez MRN: [id number] Date of Birth: Jan 10, 1961 Referring Provider:  Laurel Dimmer*  Below are your personal goals for exercise, nutrition, and risk factors. Our goal is to help you stay on track towards obtaining and maintaining these goals. We will be discussing your progress on these goals with you throughout the program.  Initial Exercise Prescription:  Initial Exercise Prescription - 09/08/22 1300       Date of Initial Exercise RX and Referring Provider   Date 09/08/22    Referring Provider Bobbye Charleston MD      Oxygen   Maintain Oxygen Saturation 88% or higher      Treadmill   MPH 1.8    Grade 1    Minutes 15    METs 2.63      NuStep   Level 2    SPM 80    Minutes 15    METs 3.21      REL-XR   Level 1    Watts 29    Speed 50    Minutes 15    METs 3.21      T5 Nustep   Level 1    SPM 80    Minutes 15    METs 3.21      Prescription Details   Frequency (times per week) 2    Duration Progress to 30 minutes of continuous aerobic without signs/symptoms of physical distress      Intensity   THRR 40-80% of Max Heartrate 100-138    Ratings of Perceived Exertion 11-13    Perceived Dyspnea 0-4      Progression   Progression Continue to progress workloads to maintain intensity without signs/symptoms of physical distress.      Resistance Training   Training Prescription Yes    Weight 3 lb    Reps 10-15             Exercise Goals: Frequency: Be able to perform aerobic exercise two to three times per week in program working toward 2-5 days per week of home exercise.  Intensity: Work with a perceived exertion of 11 (fairly light) - 15 (hard) while following your exercise prescription.  We will make changes to your prescription with you as you progress through the program.   Duration: Be able to do 30 to 45 minutes of continuous aerobic exercise in addition to a 5 minute warm-up and a 5 minute  cool-down routine.   Nutrition Goals: Your personal nutrition goals will be established when you do your nutrition analysis with the dietician.  The following are general nutrition guidelines to follow: Cholesterol < '200mg'$ /day Sodium < '1500mg'$ /day Fiber: Women over 50 yrs - 21 grams per day  Personal Goals:  Personal Goals and Risk Factors at Admission - 09/08/22 1239       Core Components/Risk Factors/Patient Goals on Admission    Weight Management Yes    Intervention Weight Management: Develop a combined nutrition and exercise program designed to reach desired caloric intake, while maintaining appropriate intake of nutrient and fiber, sodium and fats, and appropriate energy expenditure required for the weight goal.;Weight Management: Provide education and appropriate resources to help participant work on and attain dietary goals.;Weight Management/Obesity: Establish reasonable short term and long term weight goals.    Admit Weight 165 lb 4.8 oz (75 kg)    Goal Weight: Short Term 160 lb (72.6 kg)    Goal Weight: Long Term 150 lb (68 kg)  Expected Outcomes Short Term: Continue to assess and modify interventions until short term weight is achieved;Long Term: Adherence to nutrition and physical activity/exercise program aimed toward attainment of established weight goal;Weight Loss: Understanding of general recommendations for a balanced deficit meal plan, which promotes 1-2 lb weight loss per week and includes a negative energy balance of [phone] kcal/d    Tobacco Cessation Yes    Number of packs per day Cindy Gutierrez is a current tobacco user. Intervention for tobacco cessation was provided at the initial medical review. She was asked about readiness to quit and reported she wants to quit and is down to 2 cigarettes a day.  . Patient was advised and educated about tobacco cessation using combination therapy, tobacco cessation classes, quit line, and quit smoking apps. Patient demonstrated  understanding of this material. Staff will continue to provide encouragement and follow up with the patient throughout the program.    Intervention Assist the participant in steps to quit. Provide individualized education and counseling about committing to Tobacco Cessation, relapse prevention, and pharmacological support that can be provided by physician.;Advice worker, assist with locating and accessing local/national Quit Smoking programs, and support quit date choice.    Expected Outcomes Short Term: Will demonstrate readiness to quit, by selecting a quit date.;Short Term: Will quit all tobacco product use, adhering to prevention of relapse plan.;Long Term: Complete abstinence from all tobacco products for at least 12 months from quit date.    Improve shortness of breath with ADL's Yes    Intervention Provide education, individualized exercise plan and daily activity instruction to help decrease symptoms of SOB with activities of daily living.    Expected Outcomes Short Term: Improve cardiorespiratory fitness to achieve a reduction of symptoms when performing ADLs;Long Term: Be able to perform more ADLs without symptoms or delay the onset of symptoms    Increase knowledge of respiratory medications and ability to use respiratory devices properly  Yes    Intervention Provide education and demonstration as needed of appropriate use of medications, inhalers, and oxygen therapy.    Expected Outcomes Short Term: Achieves understanding of medications use. Understands that oxygen is a medication prescribed by physician. Demonstrates appropriate use of inhaler and oxygen therapy.;Long Term: Maintain appropriate use of medications, inhalers, and oxygen therapy.    Hypertension Yes    Intervention Provide education on lifestyle modifcations including regular physical activity/exercise, weight management, moderate sodium restriction and increased consumption of fresh fruit, vegetables, and low fat  dairy, alcohol moderation, and smoking cessation.;Monitor prescription use compliance.    Expected Outcomes Short Term: Continued assessment and intervention until BP is < 140/19m HG in hypertensive participants. < 130/838mHG in hypertensive participants with diabetes, heart failure or chronic kidney disease.;Long Term: Maintenance of blood pressure at goal levels.             Tobacco Use Initial Evaluation: Social History   Tobacco Use  Smoking Status Every Day   Packs/day: 0.25   Years: 45.00   Total pack years: 11.25   Types: Cigarettes  Smokeless Tobacco Never    Exercise Goals and Review:  Exercise Goals     Row Name 09/08/22 1357             Exercise Goals   Increase Physical Activity Yes       Intervention Provide advice, education, support and counseling about physical activity/exercise needs.;Develop an individualized exercise prescription for aerobic and resistive training based on initial evaluation findings, risk stratification, comorbidities and participant's personal  goals.       Expected Outcomes Short Term: Attend rehab on a regular basis to increase amount of physical activity.;Long Term: Add in home exercise to make exercise part of routine and to increase amount of physical activity.;Long Term: Exercising regularly at least 3-5 days a week.       Increase Strength and Stamina Yes       Intervention Provide advice, education, support and counseling about physical activity/exercise needs.;Develop an individualized exercise prescription for aerobic and resistive training based on initial evaluation findings, risk stratification, comorbidities and participant's personal goals.       Expected Outcomes Short Term: Increase workloads from initial exercise prescription for resistance, speed, and METs.;Short Term: Perform resistance training exercises routinely during rehab and add in resistance training at home;Long Term: Improve cardiorespiratory fitness, muscular  endurance and strength as measured by increased METs and functional capacity (6MWT)       Able to understand and use rate of perceived exertion (RPE) scale Yes       Intervention Provide education and explanation on how to use RPE scale       Expected Outcomes Long Term:  Able to use RPE to guide intensity level when exercising independently;Short Term: Able to use RPE daily in rehab to express subjective intensity level       Able to understand and use Dyspnea scale Yes       Intervention Provide education and explanation on how to use Dyspnea scale       Expected Outcomes Short Term: Able to use Dyspnea scale daily in rehab to express subjective sense of shortness of breath during exertion;Long Term: Able to use Dyspnea scale to guide intensity level when exercising independently       Knowledge and understanding of Target Heart Rate Range (THRR) Yes       Intervention Provide education and explanation of THRR including how the numbers were predicted and where they are located for reference       Expected Outcomes Short Term: Able to state/look up THRR;Long Term: Able to use THRR to govern intensity when exercising independently;Short Term: Able to use daily as guideline for intensity in rehab       Able to check pulse independently Yes       Intervention Provide education and demonstration on how to check pulse in carotid and radial arteries.;Review the importance of being able to check your own pulse for safety during independent exercise       Expected Outcomes Short Term: Able to explain why pulse checking is important during independent exercise;Long Term: Able to check pulse independently and accurately       Understanding of Exercise Prescription Yes       Intervention Provide education, explanation, and written materials on patient's individual exercise prescription       Expected Outcomes Short Term: Able to explain program exercise prescription;Long Term: Able to explain home exercise  prescription to exercise independently

## 2022-09-10 ENCOUNTER — Ambulatory Visit (INDEPENDENT_AMBULATORY_CARE_PROVIDER_SITE_OTHER): Payer: Medicaid Other

## 2022-09-10 ENCOUNTER — Encounter: Payer: Self-pay | Admitting: Emergency Medicine

## 2022-09-10 ENCOUNTER — Ambulatory Visit
Admission: EM | Admit: 2022-09-10 | Discharge: 2022-09-10 | Disposition: A | Discharge status: Home / Self Care | Payer: Medicaid Other | Attending: Physician Assistant | Admitting: Physician Assistant

## 2022-09-10 DIAGNOSIS — M5030 Other cervical disc degeneration, unspecified cervical region: Secondary | ICD-10-CM

## 2022-09-10 DIAGNOSIS — M47812 Spondylosis without myelopathy or radiculopathy, cervical region: Secondary | ICD-10-CM

## 2022-09-10 DIAGNOSIS — M79601 Pain in right arm: Secondary | ICD-10-CM | POA: Diagnosis not present

## 2022-09-10 DIAGNOSIS — M542 Cervicalgia: Secondary | ICD-10-CM

## 2022-09-10 DIAGNOSIS — M25511 Pain in right shoulder: Secondary | ICD-10-CM

## 2022-09-10 DIAGNOSIS — M5412 Radiculopathy, cervical region: Secondary | ICD-10-CM | POA: Diagnosis not present

## 2022-09-10 MED ORDER — HYDROCODONE-ACETAMINOPHEN 5-325 MG PO TABS: 1.0000 | ORAL_TABLET | Freq: Four times a day (QID) | ORAL | 0 refills | Status: AC | PRN

## 2022-09-10 MED ORDER — PREDNISONE 10 MG PO TABS: ORAL_TABLET | ORAL | 0 refills | Status: DC

## 2022-09-10 NOTE — ED Provider Notes (Signed)
[facility]    CSN: [id number] Arrival date & time: 09/10/22  1041      History   Chief Complaint Chief Complaint  Patient presents with   Arm Pain    right    HPI Cindy Gutierrez is a 61 y.o. female presenting for 2 to 65-monthhistory of pain of the right shoulder and axilla radiating all the way to the hand.  No associated pain in her neck, posterior shoulder and denies any numbness, weakness or tingling in her arm or hand.  No injuries reported.  No similar problems in the past.  Reports taking high doses of Tylenol and ibuprofen without relief.  States symptoms seem to be getting worse and not better.  Medical history significant for COPD and hypertension.  She denies any known arthritis in her neck or shoulder.  No previous musculoskeletal surgeries.  No other complaints or concerns today.  HPI  Past Medical History:  Diagnosis Date   COPD (chronic obstructive pulmonary disease) ([place]    Hypertension     Patient Active Problem List   Diagnosis Date Noted   Acute respiratory failure with hypoxia ([place] 05/23/2021   Aortic valve regurgitation 05/19/2021   Depression 05/19/2021   Primary hyperparathyroidism ([facility] 05/19/2021   Lymphedema, not elsewhere classified 05/19/2021   Major depressive disorder, single episode, moderate ([facility] 05/19/2021   Other chest pain 05/19/2021   Pulmonary nodule 05/19/2021   Viral hepatitis C 04/02/2021   Colon cancer screening    Heart murmur 10/20/2017   Chronic obstructive pulmonary disease, unspecified ([facility] 06/13/2017   Essential hypertension 06/13/2017   Tobacco use 06/13/2017   Herpes simplex 05/31/2017   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      Home Medications    Prior to Admission medications   Medication Sig Start Date End Date Taking? Authorizing Provider  albuterol (PROVENTIL HFA;VENTOLIN HFA) 108 (90 Base) MCG/ACT inhaler Inhale 1-2 puffs into the lungs every 6 (six) hours as needed for wheezing or shortness of breath. 06/26/16  Yes Hagler, Jami L, PA-C  amLODipine (NORVASC) 5 MG tablet Take 5 mg by mouth daily.   Yes [provider]  Budeson-Glycopyrrol-Formoterol (BREZTRI AEROSPHERE) 160-9-4.8 MCG/ACT AERO Inhale 2 puffs into the lungs 2 times daily at 12 noon and 4 pm.   Yes [provider]  Fluticasone-Salmeterol (ADVAIR) 250-50 MCG/DOSE AEPB Inhale 1 puff into the lungs 2 (two) times daily.   Yes [provider]  HYDROcodone-acetaminophen (NORCO/VICODIN) 5-325 MG tablet Take 1 tablet by mouth every 6 (six) hours as needed for up to 5 days. 09/10/22 09/15/22 Yes EDanton Clap PA-C  predniSONE (DELTASONE) 10 MG tablet Take 6 tabs p.o. on day 1 and decrease by 1 tablet daily until complete 09/10/22  Yes ELaurene FootmanB, PA-C  albuterol (PROVENTIL) (2.5 MG/3ML) 0.083% nebulizer solution Take 2.5 mg by nebulization every 6 (six) hours as needed for wheezing or shortness of breath.    [provider]  calcium carbonate (TUMS - DOSED IN MG ELEMENTAL CALCIUM) 500 MG chewable tablet Chew 1 tablet by mouth daily. As needed    [provider]  calcium citrate (CALCITRATE - DOSED IN MG ELEMENTAL CALCIUM) 950 (200 Ca) MG tablet Take 200 mg of elemental calcium by mouth daily.    [provider]  chlorthalidone (HYGROTON) 25 MG tablet Take 12.5 mg by mouth daily.    [provider]  FUROSEMIDE PO Take 20 mg by mouth daily. Patient not taking: Reported on 08/30/2022    [provider]  guaiFENesin (MUCINEX) 600 MG 12 hr tablet Take by mouth 2 (two) times daily as needed.    [provider]   guaiFENesin (ROBITUSSIN) 100 MG/5ML liquid Take 5 mLs by mouth every 4 (four) hours as needed for cough or to loosen phlegm.    [provider]  levofloxacin (LEVAQUIN) 500 MG tablet Take 1 tablet (500 mg total) by mouth daily. Patient not taking: Reported on 08/30/2022 05/26/21   Gwynne Edinger, MD  OVER THE COUNTER MEDICATION Take 1 Dose by mouth daily. [place]    [provider]  Tiotropium Bromide Monohydrate (SPIRIVA RESPIMAT IN) Inhale 1 puff into the lungs in the morning and at bedtime. Patient not taking: Reported on 05/23/2021    [provider]  umeclidinium bromide (INCRUSE ELLIPTA) 62.5 MCG/INH AEPB Inhale 1 puff into the lungs daily. Patient not taking: Reported on 08/30/2022    [provider]    Family History Family History  Problem Relation Age of Onset   Alzheimer's disease Mother    Heart disease Father     Social History Social History   Tobacco Use   Smoking status: Every Day    Packs/day: 0.25    Years: 45.00    Total pack years: 11.25    Types: Cigarettes   Smokeless tobacco: Never  Vaping Use   Vaping Use: Never used  Substance Use Topics   Alcohol use: Yes   Drug use: No     Allergies   Sulfa antibiotics, Bee pollen, Lisinopril, Losartan, Penicillins, Tiotropium bromide monohydrate, and Carvedilol   Review of Systems Review of Systems  Musculoskeletal:  Positive for arthralgias. Negative for joint swelling, neck pain and neck stiffness.  Neurological:  Negative for weakness and numbness.     Physical Exam Triage Vital Signs ED Triage Vitals  Enc Vitals Group     BP 09/10/22 1215 (!) 158/83     Pulse Rate 09/10/22 1215 75     Resp 09/10/22 1215 14     Temp 09/10/22 1215 98.9 F (37.2 C)     Temp Source 09/10/22 1215 Oral     SpO2 09/10/22 1215 97 %     Weight 09/10/22 1212 165 lb (74.8 kg)     Height 09/10/22 1212 '5\' 10"'$  (1.778 m)     Head Circumference --      Peak Flow --       Pain Score 09/10/22 1212 7     Pain Loc --      Pain Edu? --      Excl. in [place]? --    No data found.  Updated Vital Signs BP (!) 158/83 (BP Location: Left Arm)   Pulse 75   Temp 98.9 F (37.2 C) (Oral)   Resp 14   Ht '5\' 10"'$  (1.778 m)   Wt 165 lb (74.8 kg)   SpO2 97%   BMI 23.68 kg/m     Physical Exam Vitals and nursing note reviewed.  Constitutional:      General: She is not in acute distress.    Appearance: Normal appearance.  She is not ill-appearing or toxic-appearing.  HENT:     Head: Normocephalic and atraumatic.  Eyes:     General: No scleral icterus.       Right eye: No discharge.        Left eye: No discharge.     Conjunctiva/sclera: Conjunctivae normal.  Cardiovascular:     Rate and Rhythm: Normal rate and regular rhythm.     Heart sounds: Normal heart sounds.  Pulmonary:     Effort: Pulmonary effort is normal. No respiratory distress.     Breath sounds: Normal breath sounds.  Musculoskeletal:     Right shoulder: Tenderness (TTP right axilla, deltoid, anterior shoulder) present. No swelling. Normal range of motion.     Cervical back: Neck supple. Bony tenderness (C4, C5, C6 and right paracervical muscles) present. No pain with movement. Normal range of motion.  Skin:    General: Skin is dry.  Neurological:     General: No focal deficit present.     Mental Status: She is alert. Mental status is at baseline.     Motor: No weakness.     Gait: Gait normal.  Psychiatric:        Mood and Affect: Mood normal.        Behavior: Behavior normal.        Thought Content: Thought content normal.      UC Treatments / Results  Labs (all labs ordered are listed, but only abnormal results are displayed) Labs Reviewed - No data to display  EKG   Radiology DG Cervical Spine Complete  Result Date: 09/10/2022 CLINICAL DATA:  Right arm pain for 2-3 months.  No injury. EXAM: CERVICAL SPINE - COMPLETE 4+ VIEW COMPARISON:  None Available. FINDINGS: Mild reversal  the normal cervical lordosis, apex at C5-C6. No spondylolisthesis. Mild loss of disc height at C3-C4. Moderate loss of disc height at C4-C5 and C5-C6. Mild to moderate loss of disc height at C6-C7. Mild endplate and uncovertebral spurring at these levels. Neural foraminal narrowing on the right at C3-C4 and C4-C5 due to uncovertebral spurring. The severity of this finding appears overestimated due to under obliquity. No other evidence of significant neural foraminal narrowing. Right anterior neck base surgical vascular clips consistent with prior thyroid surgery. IMPRESSION: 1. No fracture or acute finding.  No spondylolisthesis. 2. Disc and facet degenerative changes. Possible significant neural foraminal narrowing on the right at C3-C4 and C4-C5. Electronically Signed   By: Lajean Manes M.D.   On: 09/10/2022 13:01   DG Shoulder Right  Result Date: 09/10/2022 CLINICAL DATA:  Right arm pain for 2-3 months.  No injury. EXAM: RIGHT SHOULDER - 2+ VIEW COMPARISON:  None Available. FINDINGS: No fracture or bone lesion. The glenohumeral and AC joints are normally spaced and aligned. Soft tissues are unremarkable. IMPRESSION: Negative. Electronically Signed   By: Lajean Manes M.D.   On: 09/10/2022 12:58    Procedures Procedures (including critical care time)  Medications Ordered in UC Medications - No data to display  Initial Impression / Assessment and Plan / UC Course  I have reviewed the triage vital signs and the nursing notes.  Pertinent labs & imaging results that were available during my care of the patient were reviewed by me and considered in my medical decision making (see chart for details).   61 year old female presents for right axillary pain radiating to right shoulder and on the right arm without associated numbness, weakness or tingling for 2 to 3 months.  Denies  any trauma.  On examination she does have cervical spinal tenderness and tenderness of the right paracervical muscles as well  as diffuse tenderness of the right anterior shoulder, deltoid and axilla.  Full range of motion of the shoulder and neck.  X-rays of the right shoulder and neck obtained.  Shoulder x-rays normal.  X-ray of neck shows disc and facet degenerative changes as well as possible significant neural foraminal narrowing on the right at C3-C4 and C4-C5.  Discussed all results with patient.  Suspect her pain is due to cervical radiculopathy from the underlying facet arthropathy, degenerative disc problems.  Will treat at this time with prednisone taper.  Also prescribed short course of Norco as needed for severe/breakthrough pain.  Advised contacting orthopedics for an appointment ASAP as she needs an MRI and may benefit from epidural injections or potential surgery.  Patient is agreeable.   Final Clinical Impressions(s) / UC Diagnoses   Final diagnoses:  Cervical radiculopathy  Acute pain of right shoulder  Degenerative disc disease, cervical  Facet arthropathy, cervical     Discharge Instructions      -Your neck x-ray shows significant arthritis in your neck.  This is likely the cause of your radiating symptoms in your arm.  I have sent a corticosteroid to the pharmacy as well as a pain medication as needed but you will ultimately need to follow-up with orthopedics for an MRI and further treatment.  NECK PAIN: Stressed avoiding painful activities. This can exacerbate your symptoms and make them worse.  May apply heat to the areas of pain for some relief. Use medications as directed. Be aware of which medications make you drowsy and do not drive or operate any kind of heavy machinery while using the medication (ie pain medications or muscle relaxers). F/U with PCP for reexamination or return sooner if condition worsens or does not begin to improve over the next few days.   NECK PAIN RED FLAGS: If symptoms get worse than they are right now, you should come back sooner for re-evaluation. If you have  increased numbness/ tingling or notice that the numbness/tingling is affecting the legs or saddle region, go to ER. If you ever lose continence go to ER.      You have a condition requiring you to follow up with Orthopedics so please call one of the following office for appointment:   [facility] [address] [place], [place] [address] Phone: [phone]  [facility] [address] [place], [place] [address] Phone: [phone]      ED Prescriptions     Medication Sig Dispense Auth. Provider   predniSONE (DELTASONE) 10 MG tablet Take 6 tabs p.o. on day 1 and decrease by 1 tablet daily until complete 21 tablet Laurene Footman B, PA-C   HYDROcodone-acetaminophen (NORCO/VICODIN) 5-325 MG tablet Take 1 tablet by mouth every 6 (six) hours as needed for up to 5 days. 15 tablet Danton Clap, PA-C      I have reviewed the PDMP during this encounter.   Danton Clap, PA-C 09/10/22 1339

## 2022-09-10 NOTE — Discharge Instructions (Addendum)
-  Your neck x-ray shows significant arthritis in your neck.  This is likely the cause of your radiating symptoms in your arm.  I have sent a corticosteroid to the pharmacy as well as a pain medication as needed but you will ultimately need to follow-up with orthopedics for an MRI and further treatment.  NECK PAIN: Stressed avoiding painful activities. This can exacerbate your symptoms and make them worse.  May apply heat to the areas of pain for some relief. Use medications as directed. Be aware of which medications make you drowsy and do not drive or operate any kind of heavy machinery while using the medication (ie pain medications or muscle relaxers). F/U with PCP for reexamination or return sooner if condition worsens or does not begin to improve over the next few days.   NECK PAIN RED FLAGS: If symptoms get worse than they are right now, you should come back sooner for re-evaluation. If you have increased numbness/ tingling or notice that the numbness/tingling is affecting the legs or saddle region, go to ER. If you ever lose continence go to ER.      You have a condition requiring you to follow up with Orthopedics so please call one of the following office for appointment:   [facility] [address] [place], [place] [address] Phone: [phone]  [facility] [address], [place], [place] [address] Phone: [phone]

## 2022-09-10 NOTE — ED Triage Notes (Signed)
Patient c/o pain in her right armpit that radiates to her right shoulder and down her right arm since October 2023.  Patient denies injury or fall.

## 2022-09-13 ENCOUNTER — Ambulatory Visit: Payer: No Typology Code available for payment source

## 2022-09-13 NOTE — Progress Notes (Signed)
Completed initial RD consultation ?

## 2022-09-20 ENCOUNTER — Encounter: Payer: No Typology Code available for payment source | Admitting: *Deleted

## 2022-09-20 ENCOUNTER — Ambulatory Visit: Payer: No Typology Code available for payment source

## 2022-09-20 DIAGNOSIS — A4189 Other specified sepsis: Secondary | ICD-10-CM | POA: Diagnosis not present

## 2022-09-20 DIAGNOSIS — J101 Influenza due to other identified influenza virus with other respiratory manifestations: Secondary | ICD-10-CM | POA: Diagnosis not present

## 2022-09-20 DIAGNOSIS — J449 Chronic obstructive pulmonary disease, unspecified: Secondary | ICD-10-CM

## 2022-09-20 DIAGNOSIS — R0600 Dyspnea, unspecified: Secondary | ICD-10-CM

## 2022-09-20 NOTE — Progress Notes (Signed)
Daily Session Note  Patient Details  Name: Cindy Gutierrez MRN: [id number] Date of Birth: [birth year] Referring Provider:   Flowsheet Row Pulmonary Rehab from 09/08/2022 in [facility]  Referring Provider Bobbye Charleston MD       Encounter Date: 09/20/2022  Check In:  Session Check In - 09/20/22 1110       Check-In   Supervising physician immediately available to respond to emergencies See telemetry face sheet for immediately available ER MD    Location [facility]    Staff Present Renita Papa, RN BSN;Jessica Luan Pulling, MA, RCEP, CCRP, Bertram Gala, MS, ACSM CEP, Exercise Physiologist    Virtual Visit Yes    Medication changes reported     No    Fall or balance concerns reported    No    Tobacco Cessation No Change    Current number of cigarettes/nicotine per day     2    Warm-up and Cool-down Performed on first and last piece of equipment    Resistance Training Performed Yes    VAD Patient? No    PAD/SET Patient? No      Pain Assessment   Currently in Pain? No/denies                Social History   Tobacco Use  Smoking Status Every Day   Packs/day: 0.25   Years: 45.00   Total pack years: 11.25   Types: Cigarettes  Smokeless Tobacco Never    Goals Met:  Independence with exercise equipment Exercise tolerated well No report of concerns or symptoms today Strength training completed today  Goals Unmet:  Not Applicable  Comments: First full day of exercise!  Patient was oriented to gym and equipment including functions, settings, policies, and procedures.  Patient's individual exercise prescription and treatment plan were reviewed.  All starting workloads were established based on the results of the 6 minute walk test done at initial orientation visit.  The plan for exercise progression was also introduced and progression will be customized based on patient's performance and goals.    Dr. Emily Filbert is  Medical Director for [place].  Dr. Ottie Glazier is Medical Director for [facility] Pulmonary Rehabilitation.

## 2022-09-22 ENCOUNTER — Ambulatory Visit: Payer: No Typology Code available for payment source

## 2022-09-23 ENCOUNTER — Emergency Department: Payer: No Typology Code available for payment source

## 2022-09-23 ENCOUNTER — Inpatient Hospital Stay
Admission: EM | Admit: 2022-09-23 | Discharge: 2022-09-28 | DRG: 871 | Disposition: A | Discharge status: Home / Self Care | Payer: No Typology Code available for payment source | Attending: Internal Medicine | Admitting: Internal Medicine

## 2022-09-23 DIAGNOSIS — J09X1 Influenza due to identified novel influenza A virus with pneumonia: Secondary | ICD-10-CM | POA: Diagnosis present

## 2022-09-23 DIAGNOSIS — J1000 Influenza due to other identified influenza virus with unspecified type of pneumonia: Secondary | ICD-10-CM | POA: Diagnosis present

## 2022-09-23 DIAGNOSIS — J439 Emphysema, unspecified: Secondary | ICD-10-CM | POA: Diagnosis present

## 2022-09-23 DIAGNOSIS — J9621 Acute and chronic respiratory failure with hypoxia: Secondary | ICD-10-CM | POA: Diagnosis present

## 2022-09-23 DIAGNOSIS — Z5320 Procedure and treatment not carried out because of patient's decision for unspecified reasons: Secondary | ICD-10-CM | POA: Diagnosis present

## 2022-09-23 DIAGNOSIS — Z820 Family history of epilepsy and other diseases of the nervous system: Secondary | ICD-10-CM

## 2022-09-23 DIAGNOSIS — F32A Depression, unspecified: Secondary | ICD-10-CM | POA: Diagnosis present

## 2022-09-23 DIAGNOSIS — J441 Chronic obstructive pulmonary disease with (acute) exacerbation: Secondary | ICD-10-CM | POA: Diagnosis present

## 2022-09-23 DIAGNOSIS — J101 Influenza due to other identified influenza virus with other respiratory manifestations: Secondary | ICD-10-CM

## 2022-09-23 DIAGNOSIS — R739 Hyperglycemia, unspecified: Secondary | ICD-10-CM | POA: Diagnosis present

## 2022-09-23 DIAGNOSIS — E042 Nontoxic multinodular goiter: Secondary | ICD-10-CM | POA: Diagnosis present

## 2022-09-23 DIAGNOSIS — Z882 Allergy status to sulfonamides status: Secondary | ICD-10-CM

## 2022-09-23 DIAGNOSIS — A419 Sepsis, unspecified organism: Secondary | ICD-10-CM | POA: Diagnosis not present

## 2022-09-23 DIAGNOSIS — J440 Chronic obstructive pulmonary disease with acute lower respiratory infection: Secondary | ICD-10-CM | POA: Diagnosis present

## 2022-09-23 DIAGNOSIS — B1920 Unspecified viral hepatitis C without hepatic coma: Secondary | ICD-10-CM | POA: Diagnosis present

## 2022-09-23 DIAGNOSIS — Z91030 Bee allergy status: Secondary | ICD-10-CM

## 2022-09-23 DIAGNOSIS — E872 Acidosis, unspecified: Secondary | ICD-10-CM | POA: Diagnosis present

## 2022-09-23 DIAGNOSIS — I080 Rheumatic disorders of both mitral and aortic valves: Secondary | ICD-10-CM | POA: Diagnosis present

## 2022-09-23 DIAGNOSIS — E213 Hyperparathyroidism, unspecified: Secondary | ICD-10-CM | POA: Diagnosis present

## 2022-09-23 DIAGNOSIS — I160 Hypertensive urgency: Secondary | ICD-10-CM | POA: Diagnosis present

## 2022-09-23 DIAGNOSIS — E876 Hypokalemia: Secondary | ICD-10-CM | POA: Diagnosis present

## 2022-09-23 DIAGNOSIS — B351 Tinea unguium: Secondary | ICD-10-CM

## 2022-09-23 DIAGNOSIS — Z8249 Family history of ischemic heart disease and other diseases of the circulatory system: Secondary | ICD-10-CM

## 2022-09-23 DIAGNOSIS — I10 Essential (primary) hypertension: Secondary | ICD-10-CM | POA: Diagnosis present

## 2022-09-23 DIAGNOSIS — F17210 Nicotine dependence, cigarettes, uncomplicated: Secondary | ICD-10-CM | POA: Diagnosis present

## 2022-09-23 DIAGNOSIS — R6520 Severe sepsis without septic shock: Secondary | ICD-10-CM | POA: Diagnosis present

## 2022-09-23 DIAGNOSIS — I272 Pulmonary hypertension, unspecified: Secondary | ICD-10-CM | POA: Diagnosis present

## 2022-09-23 DIAGNOSIS — F410 Panic disorder [episodic paroxysmal anxiety] without agoraphobia: Secondary | ICD-10-CM | POA: Diagnosis present

## 2022-09-23 DIAGNOSIS — J9601 Acute respiratory failure with hypoxia: Secondary | ICD-10-CM | POA: Diagnosis not present

## 2022-09-23 DIAGNOSIS — A4189 Other specified sepsis: Principal | ICD-10-CM | POA: Diagnosis present

## 2022-09-23 DIAGNOSIS — Z880 Allergy status to penicillin: Secondary | ICD-10-CM

## 2022-09-23 DIAGNOSIS — E039 Hypothyroidism, unspecified: Secondary | ICD-10-CM | POA: Diagnosis present

## 2022-09-23 DIAGNOSIS — Z1152 Encounter for screening for COVID-19: Secondary | ICD-10-CM

## 2022-09-23 DIAGNOSIS — Z888 Allergy status to other drugs, medicaments and biological substances status: Secondary | ICD-10-CM

## 2022-09-23 DIAGNOSIS — F17200 Nicotine dependence, unspecified, uncomplicated: Secondary | ICD-10-CM | POA: Insufficient documentation

## 2022-09-23 HISTORY — DX: Solitary pulmonary nodule: R91.1

## 2022-09-23 HISTORY — DX: Nontoxic multinodular goiter: E04.2

## 2022-09-23 HISTORY — DX: Hyperparathyroidism, unspecified: E21.3

## 2022-09-23 HISTORY — DX: Nonrheumatic mitral (valve) insufficiency: I34.0

## 2022-09-23 HISTORY — DX: Nonrheumatic aortic (valve) insufficiency: I35.1

## 2022-09-23 HISTORY — DX: Disorder of thyroid, unspecified: E07.9

## 2022-09-23 HISTORY — DX: Unspecified viral hepatitis C without hepatic coma: B19.20

## 2022-09-23 HISTORY — DX: Depression, unspecified: F32.A

## 2022-09-23 LAB — LACTIC ACID, PLASMA
Lactic Acid, Venous: 3.1 mmol/L (ref 0.5–1.9)
Lactic Acid, Venous: 4.8 mmol/L (ref 0.5–1.9)

## 2022-09-23 LAB — RESP PANEL BY RT-PCR (RSV, FLU A&B, COVID)  RVPGX2
Influenza A by PCR: POSITIVE — AB
Influenza B by PCR: NEGATIVE
Resp Syncytial Virus by PCR: NEGATIVE
SARS Coronavirus 2 by RT PCR: NEGATIVE

## 2022-09-23 LAB — COMPREHENSIVE METABOLIC PANEL
ALT: 18 U/L (ref 0–44)
AST: 28 U/L (ref 15–41)
Albumin: 3.9 g/dL (ref 3.5–5.0)
Alkaline Phosphatase: 100 U/L (ref 38–126)
Anion gap: 11 (ref 5–15)
BUN: 12 mg/dL (ref 8–23)
CO2: 26 mmol/L (ref 22–32)
Calcium: 8.6 mg/dL — ABNORMAL LOW (ref 8.9–10.3)
Chloride: 102 mmol/L (ref 98–111)
Creatinine, Ser: 0.73 mg/dL (ref 0.44–1.00)
GFR, Estimated: >60 mL/min (ref >60)
Glucose, Bld: 249 mg/dL — ABNORMAL HIGH (ref 70–99)
Potassium: 3.3 mmol/L — ABNORMAL LOW (ref 3.5–5.1)
Sodium: 139 mmol/L (ref 135–145)
Total Bilirubin: 0.7 mg/dL (ref 0.3–1.2)
Total Protein: 7.6 g/dL (ref 6.5–8.1)

## 2022-09-23 LAB — CBC WITH DIFFERENTIAL/PLATELET
Abs Immature Granulocytes: 0.03 10*3/uL (ref 0.00–0.07)
Basophils Absolute: 0 10*3/uL (ref 0.0–0.1)
Basophils Relative: 0 %
Eosinophils Absolute: 0 10*3/uL (ref 0.0–0.5)
Eosinophils Relative: 0 %
HCT: 43.1 % (ref 36.0–46.0)
Hemoglobin: 13.6 g/dL (ref 12.0–15.0)
Immature Granulocytes: 0 %
Lymphocytes Relative: 4 %
Lymphs Abs: 0.4 10*3/uL — ABNORMAL LOW (ref 0.7–4.0)
MCH: 27.3 pg (ref 26.0–34.0)
MCHC: 31.6 g/dL (ref 30.0–36.0)
MCV: 86.5 fL (ref 80.0–100.0)
Monocytes Absolute: 0.1 10*3/uL (ref 0.1–1.0)
Monocytes Relative: 1 %
Neutro Abs: 9.6 10*3/uL — ABNORMAL HIGH (ref 1.7–7.7)
Neutrophils Relative %: 95 %
Platelets: 323 10*3/uL (ref 150–400)
RBC: 4.98 MIL/uL (ref 3.87–5.11)
RDW: 14.1 % (ref 11.5–15.5)
WBC: 10.2 10*3/uL (ref 4.0–10.5)
nRBC: 0 % (ref 0.0–0.2)

## 2022-09-23 LAB — BRAIN NATRIURETIC PEPTIDE: B Natriuretic Peptide: 64.9 pg/mL (ref 0.0–100.0)

## 2022-09-23 LAB — BLOOD GAS, VENOUS
Acid-Base Excess: 4.5 mmol/L — ABNORMAL HIGH (ref 0.0–2.0)
Bicarbonate: 30.4 mmol/L — ABNORMAL HIGH (ref 20.0–28.0)
O2 Saturation: 94.4 %
Patient temperature: 37
pCO2, Ven: 49 mmHg (ref 44–60)
pH, Ven: 7.4 (ref 7.25–7.43)
pO2, Ven: 66 mmHg — ABNORMAL HIGH (ref 32–45)

## 2022-09-23 LAB — TROPONIN I (HIGH SENSITIVITY)
Troponin I (High Sensitivity): 8 ng/L (ref <18)
Troponin I (High Sensitivity): 8 ng/L (ref <18)

## 2022-09-23 LAB — AMMONIA: Ammonia: 19 umol/L (ref 9–35)

## 2022-09-23 MED ORDER — LACTATED RINGERS IV SOLN: INTRAVENOUS | Status: DC

## 2022-09-23 MED ORDER — SODIUM CHLORIDE 0.9 % IV SOLN
500.0000 mg | INTRAVENOUS | Status: DC
  Administered 2022-09-24: 500 mg via INTRAVENOUS
  Filled 2022-09-23: qty 5

## 2022-09-23 MED ORDER — LORAZEPAM 2 MG/ML IJ SOLN
INTRAMUSCULAR | Status: AC
  Filled 2022-09-23: qty 1

## 2022-09-23 MED ORDER — METHYLPREDNISOLONE SODIUM SUCC 40 MG IJ SOLR
40.0000 mg | Freq: Two times a day (BID) | INTRAMUSCULAR | Status: AC
  Administered 2022-09-24 (×2): 40 mg via INTRAVENOUS
  Filled 2022-09-23 (×2): qty 1

## 2022-09-23 MED ORDER — SODIUM CHLORIDE 0.9 % IV SOLN
1.0000 g | INTRAVENOUS | Status: DC
  Administered 2022-09-24: 1 g via INTRAVENOUS
  Filled 2022-09-23: qty 10

## 2022-09-23 MED ORDER — ALBUTEROL SULFATE (2.5 MG/3ML) 0.083% IN NEBU
2.5000 mg | INHALATION_SOLUTION | RESPIRATORY_TRACT | Status: DC
  Administered 2022-09-24 (×2): 2.5 mg via RESPIRATORY_TRACT
  Filled 2022-09-23 (×2): qty 3

## 2022-09-23 MED ORDER — OSELTAMIVIR PHOSPHATE 75 MG PO CAPS
75.0000 mg | ORAL_CAPSULE | Freq: Once | ORAL | Status: AC
  Administered 2022-09-24: 75 mg via ORAL
  Filled 2022-09-23: qty 1

## 2022-09-23 MED ORDER — SODIUM CHLORIDE 0.9 % IV BOLUS
500.0000 mL | Freq: Once | INTRAVENOUS | Status: AC
  Administered 2022-09-23: 500 mL via INTRAVENOUS

## 2022-09-23 MED ORDER — PREDNISONE 20 MG PO TABS
40.0000 mg | ORAL_TABLET | Freq: Every day | ORAL | Status: AC
  Administered 2022-09-25 – 2022-09-28 (×4): 40 mg via ORAL
  Filled 2022-09-23 (×4): qty 2

## 2022-09-23 MED ORDER — GABAPENTIN 300 MG PO CAPS
300.0000 mg | ORAL_CAPSULE | Freq: Every day | ORAL | Status: DC
  Administered 2022-09-24 – 2022-09-27 (×5): 300 mg via ORAL
  Filled 2022-09-23 (×5): qty 1

## 2022-09-23 MED ORDER — LORAZEPAM 2 MG/ML IJ SOLN: 1.0000 mg | Freq: Once | INTRAMUSCULAR | Status: DC

## 2022-09-23 MED ORDER — CHLORTHALIDONE 25 MG PO TABS
12.5000 mg | ORAL_TABLET | Freq: Every day | ORAL | Status: DC
  Administered 2022-09-24 – 2022-09-28 (×5): 12.5 mg via ORAL
  Filled 2022-09-23 (×5): qty 0.5

## 2022-09-23 MED ORDER — ACETAMINOPHEN 10 MG/ML IV SOLN
1000.0000 mg | INTRAVENOUS | Status: AC
  Administered 2022-09-24: 1000 mg via INTRAVENOUS
  Filled 2022-09-23: qty 100

## 2022-09-23 MED ORDER — INSULIN ASPART 100 UNIT/ML IJ SOLN
0.0000 [IU] | Freq: Three times a day (TID) | INTRAMUSCULAR | Status: DC
  Administered 2022-09-24 (×3): 2 [IU] via SUBCUTANEOUS
  Administered 2022-09-25: 3 [IU] via SUBCUTANEOUS
  Administered 2022-09-25 (×2): 2 [IU] via SUBCUTANEOUS
  Administered 2022-09-26: 3 [IU] via SUBCUTANEOUS
  Administered 2022-09-26 – 2022-09-27 (×3): 2 [IU] via SUBCUTANEOUS
  Administered 2022-09-28: 3 [IU] via SUBCUTANEOUS
  Filled 2022-09-23 (×11): qty 1

## 2022-09-23 MED ORDER — SODIUM CHLORIDE 0.9 % IV BOLUS
1000.0000 mL | Freq: Once | INTRAVENOUS | Status: AC
  Administered 2022-09-23: 1000 mL via INTRAVENOUS

## 2022-09-23 MED ORDER — ALBUTEROL SULFATE (2.5 MG/3ML) 0.083% IN NEBU
2.5000 mg | INHALATION_SOLUTION | Freq: Once | RESPIRATORY_TRACT | Status: AC
  Administered 2022-09-23: 2.5 mg via RESPIRATORY_TRACT
  Filled 2022-09-23: qty 3

## 2022-09-23 MED ORDER — AMLODIPINE BESYLATE 10 MG PO TABS
10.0000 mg | ORAL_TABLET | Freq: Every day | ORAL | Status: DC
  Administered 2022-09-24 – 2022-09-28 (×5): 10 mg via ORAL
  Filled 2022-09-23: qty 2
  Filled 2022-09-23: qty 1
  Filled 2022-09-23: qty 2
  Filled 2022-09-23 (×2): qty 1

## 2022-09-23 MED ORDER — ASPIRIN 81 MG PO CHEW
81.0000 mg | CHEWABLE_TABLET | Freq: Every day | ORAL | Status: DC
  Administered 2022-09-24 – 2022-09-28 (×5): 81 mg via ORAL
  Filled 2022-09-23 (×5): qty 1

## 2022-09-23 MED ORDER — UMECLIDINIUM BROMIDE 62.5 MCG/ACT IN AEPB
1.0000 | INHALATION_SPRAY | Freq: Every day | RESPIRATORY_TRACT | Status: DC
  Administered 2022-09-25 – 2022-09-26 (×2): 1 via RESPIRATORY_TRACT
  Filled 2022-09-23: qty 7

## 2022-09-23 MED ORDER — NICOTINE 14 MG/24HR TD PT24
14.0000 mg | MEDICATED_PATCH | Freq: Every day | TRANSDERMAL | Status: DC
  Administered 2022-09-24 – 2022-09-28 (×5): 14 mg via TRANSDERMAL
  Filled 2022-09-23 (×5): qty 1

## 2022-09-23 MED ORDER — GLECAPREVIR-PIBRENTASVIR 100-40 MG PO TABS: 3.0000 | ORAL_TABLET | Freq: Every day | ORAL | Status: DC

## 2022-09-23 MED ORDER — POTASSIUM CHLORIDE 20 MEQ PO PACK
40.0000 meq | PACK | Freq: Once | ORAL | Status: AC
  Administered 2022-09-24: 40 meq via ORAL
  Filled 2022-09-23: qty 2

## 2022-09-23 MED ORDER — ENOXAPARIN SODIUM 40 MG/0.4ML IJ SOSY
40.0000 mg | PREFILLED_SYRINGE | INTRAMUSCULAR | Status: DC
  Administered 2022-09-24: 40 mg via SUBCUTANEOUS
  Filled 2022-09-23 (×4): qty 0.4

## 2022-09-23 MED ORDER — HYDRALAZINE HCL 20 MG/ML IJ SOLN
10.0000 mg | INTRAMUSCULAR | Status: DC | PRN
  Administered 2022-09-24: 10 mg via INTRAVENOUS
  Filled 2022-09-23: qty 1

## 2022-09-23 MED ORDER — INSULIN ASPART 100 UNIT/ML IJ SOLN
0.0000 [IU] | Freq: Every day | INTRAMUSCULAR | Status: DC
  Administered 2022-09-24: 2 [IU] via SUBCUTANEOUS
  Filled 2022-09-23: qty 1

## 2022-09-23 MED ORDER — DILTIAZEM HCL 25 MG/5ML IV SOLN
15.0000 mg | Freq: Four times a day (QID) | INTRAVENOUS | Status: DC | PRN
  Administered 2022-09-25: 15 mg via INTRAVENOUS
  Filled 2022-09-23: qty 5

## 2022-09-23 MED ORDER — NICOTINE POLACRILEX 2 MG MT GUM: 2.0000 mg | CHEWING_GUM | OROMUCOSAL | Status: DC | PRN

## 2022-09-23 MED ORDER — PANTOPRAZOLE SODIUM 40 MG IV SOLR
40.0000 mg | INTRAVENOUS | Status: DC
  Administered 2022-09-24 – 2022-09-25 (×3): 40 mg via INTRAVENOUS
  Filled 2022-09-23 (×3): qty 10

## 2022-09-23 MED ORDER — ALBUTEROL SULFATE (2.5 MG/3ML) 0.083% IN NEBU
2.5000 mg | INHALATION_SOLUTION | RESPIRATORY_TRACT | Status: DC | PRN
  Administered 2022-09-24 – 2022-09-25 (×2): 2.5 mg via RESPIRATORY_TRACT
  Filled 2022-09-23 (×2): qty 3

## 2022-09-23 NOTE — Assessment & Plan Note (Signed)
Keep systolic blood pressure under 160

## 2022-09-23 NOTE — Assessment & Plan Note (Signed)
Intravenous Solu-Medrol, albuterol, IV antibiotics including oseltamivir

## 2022-09-23 NOTE — ED Triage Notes (Signed)
Pt from home BIB [facility] for respiratory distress/COPD exacerbation. Pt c/o sob x2 days. FD sats 80% RA, EMS placed pt on NRB, sats increased to 90s, then pt placed on CPAP w/sats improved to 100%. HR 120s, RR 46, bilateral wheezing to lung fields, appears tight, pt alert on arrival, reports CP earlier today, reports respiratory infection, w/o treatment for 2 days per pt w/nasal congestion, denies fever.  Pt switch to BiPAP on arrival, Dr. Cherylann Banas at the bedside

## 2022-09-23 NOTE — Assessment & Plan Note (Signed)
Patient is still demonstrating at least mild to moderate degree of respiratory distress.  However patient at this time is declining to use BiPAP.  We will keep this available as needed.  Continue with supplemental oxygen and continuous pulse oximetry

## 2022-09-23 NOTE — ED Notes (Signed)
Pt refusing BiPap at this time Dr. Cherylann Banas at the bedside, decision to place pt on [place] 6L to see how she handles, Dr. Cherylann Banas has explained to pt risk or coming of BIPap and possibility of possible intubation if she deteriorates can cannot handle [place] or BiPap, pt verbalized understanding.

## 2022-09-23 NOTE — Assessment & Plan Note (Signed)
Insulin n sliding scale

## 2022-09-23 NOTE — Assessment & Plan Note (Signed)
I suspect this is due to patient's respiratory status.  Rather than any uncontrolled sepsis or hypotension.  However we will continue with IV fluids and trend

## 2022-09-23 NOTE — Progress Notes (Signed)
Bipap pulled from room as patient could not tolerate anything on her face. Patient did well on 6 liter nasal cannula and svn via hand held. Will continue to monitorl.

## 2022-09-23 NOTE — ED Provider Notes (Signed)
[facility] Provider Note    Event Date/Time   First MD Initiated Contact with Patient 09/23/22 1908     (approximate)   History   Respiratory Distress   HPI  Cindy Gutierrez is a 61 y.o. female with a history of COPD, hypertension, hypothyroidism, and aortic stenosis who presents with shortness of breath for the last 2 days, acutely worsened this afternoon, persistent course, associated with cough but no fever.  The patient did have some chest pain earlier that has now resolved.  She denies any vomiting or diarrhea.  She states it feels like prior COPD exacerbations.  Per EMS the patient was found to have an O2 saturation around 80% on room air.  They placed her on a nonrebreather and then subsequently on CPAP with an improvement in her O2 saturation to the high 90s.  I reviewed the past medical records.  The patient was most recently admitted in August 2022 and per the hospitalist discharge summary she presented with an acute COPD exacerbation requiring O2 by nasal cannula.  She was also seen in the ED on 10/31 of last year for shortness of breath and on 12/9 of this year for right shoulder pain.   Physical Exam   Triage Vital Signs: ED Triage Vitals  Enc Vitals Group     BP 09/23/22 1909 108/81     Pulse Rate 09/23/22 1909 (!) 117     Resp 09/23/22 1909 (!) 25     Temp 09/23/22 1909 (!) 97.5 F (36.4 C)     Temp src --      SpO2 09/23/22 1906 100 %     Weight --      Height --      Head Circumference --      Peak Flow --      Pain Score 09/23/22 1919 0     Pain Loc --      Pain Edu? --      Excl. in [place]? --     Most recent vital signs: Vitals:   09/23/22 2130 09/23/22 2131  BP:  (!) 165/57  Pulse: 85 86  Resp: (!) 30 (!) 32  Temp:    SpO2: 100% 100%     General: Alert and oriented, anxious appearing, acute respiratory distress. CV:  Good peripheral perfusion.  Tachycardic, otherwise normal heart sounds. Resp:  Priest respiratory effort  with accessory muscle use.  Diminished breath sounds bilaterally with faint wheezes. Abd:  No distention.  Other:  No peripheral edema.   ED Results / Procedures / Treatments   Labs (all labs ordered are listed, but only abnormal results are displayed) Labs Reviewed  RESP PANEL BY RT-PCR (RSV, FLU A&B, COVID)  [id number] - Abnormal; Notable for the following components:      Result Value   Influenza A by PCR POSITIVE (*)    All other components within normal limits  COMPREHENSIVE METABOLIC PANEL - Abnormal; Notable for the following components:   Potassium 3.3 (*)    Glucose, Bld 249 (*)    Calcium 8.6 (*)    All other components within normal limits  CBC WITH DIFFERENTIAL/PLATELET - Abnormal; Notable for the following components:   Neutro Abs 9.6 (*)    Lymphs Abs 0.4 (*)    All other components within normal limits  BLOOD GAS, VENOUS - Abnormal; Notable for the following components:   pO2, Ven 66 (*)    Bicarbonate 30.4 (*)    Acid-Base Excess  4.5 (*)    All other components within normal limits  LACTIC ACID, PLASMA - Abnormal; Notable for the following components:   Lactic Acid, Venous 3.1 (*)    All other components within normal limits  LACTIC ACID, PLASMA - Abnormal; Notable for the following components:   Lactic Acid, Venous 4.8 (*)    All other components within normal limits  BRAIN NATRIURETIC PEPTIDE  AMMONIA  URINALYSIS, ROUTINE W REFLEX MICROSCOPIC  COMPREHENSIVE METABOLIC PANEL  CBC  CBC  CREATININE, SERUM  PROTIME-INR  APTT  HEMOGLOBIN A1C  TROPONIN I (HIGH SENSITIVITY)  TROPONIN I (HIGH SENSITIVITY)     EKG  ED ECG REPORT I, Arta Silence, the attending physician, personally viewed and interpreted this ECG.  Date: 09/23/2022 EKG Time: 2003 Rate: 99 Rhythm: normal sinus rhythm QRS Axis: Left axis  Intervals: normal ST/T Wave abnormalities: Nonspecific abnormalities Narrative Interpretation: no evidence of acute  ischemia   RADIOLOGY  Chest x-ray: I independently viewed and interpreted the images; there are bilateral interstitial opacities with no focal consolidation or edema   PROCEDURES:  Critical Care performed: Yes, see critical care procedure note(s)  .Critical Care  Performed by: Arta Silence, MD Authorized by: Arta Silence, MD   Critical care provider statement:    Critical care time (minutes):  30   Critical care time was exclusive of:  Separately billable procedures and treating other patients   Critical care was necessary to treat or prevent imminent or life-threatening deterioration of the following conditions:  Respiratory failure   Critical care was time spent personally by me on the following activities:  Development of treatment plan with patient or surrogate, discussions with consultants, evaluation of patient's response to treatment, examination of patient, ordering and review of laboratory studies, ordering and review of radiographic studies, ordering and performing treatments and interventions, pulse oximetry, re-evaluation of patient's condition, review of old charts and obtaining history from patient or surrogate   Care discussed with: admitting provider      MEDICATIONS ORDERED IN ED: Medications  oseltamivir (TAMIFLU) capsule 75 mg (has no administration in time range)  acetaminophen (OFIRMEV) IV 1,000 mg (has no administration in time range)  nicotine polacrilex (NICORETTE) gum 2 mg (has no administration in time range)  cefTRIAXone (ROCEPHIN) 1 g in sodium chloride 0.9 % 100 mL IVPB (1 g Intravenous New Bag/Given 09/24/22 0010)  methylPREDNISolone sodium succinate (SOLU-MEDROL) 40 mg/mL injection 40 mg (has no administration in time range)    Followed by  predniSONE (DELTASONE) tablet 40 mg (has no administration in time range)  albuterol (PROVENTIL) (2.5 MG/3ML) 0.083% nebulizer solution 2.5 mg (has no administration in time range)  albuterol  (PROVENTIL) (2.5 MG/3ML) 0.083% nebulizer solution 2.5 mg (2.5 mg Nebulization Given 09/24/22 0008)  azithromycin (ZITHROMAX) 500 mg in sodium chloride 0.9 % 250 mL IVPB (has no administration in time range)  aspirin chewable tablet 81 mg (has no administration in time range)  Glecaprevir-Pibrentasvir 100-40 MG TABS 3 tablet (has no administration in time range)  amLODipine (NORVASC) tablet 10 mg (has no administration in time range)  chlorthalidone (HYGROTON) tablet 12.5 mg (has no administration in time range)  nicotine (NICODERM CQ - dosed in mg/24 hours) patch 14 mg (has no administration in time range)  gabapentin (NEURONTIN) capsule 300 mg (has no administration in time range)  umeclidinium bromide (INCRUSE ELLIPTA) 62.5 MCG/ACT 1 puff (has no administration in time range)  enoxaparin (LOVENOX) injection 40 mg (has no administration in time range)  pantoprazole (PROTONIX) injection  40 mg (has no administration in time range)  potassium chloride (KLOR-CON) packet 40 mEq (has no administration in time range)  insulin aspart (novoLOG) injection 0-15 Units (has no administration in time range)  insulin aspart (novoLOG) injection 0-5 Units (has no administration in time range)  lactated ringers infusion (has no administration in time range)  diltiazem (CARDIZEM) injection 15 mg (has no administration in time range)  hydrALAZINE (APRESOLINE) injection 10 mg (has no administration in time range)  sodium chloride 0.9 % bolus 500 mL (500 mLs Intravenous New Bag/Given 09/23/22 1934)  albuterol (PROVENTIL) (2.5 MG/3ML) 0.083% nebulizer solution 2.5 mg (2.5 mg Nebulization Given 09/23/22 1918)  albuterol (PROVENTIL) (2.5 MG/3ML) 0.083% nebulizer solution 2.5 mg (2.5 mg Nebulization Given 09/23/22 1922)  sodium chloride 0.9 % bolus 1,000 mL (1,000 mLs Intravenous New Bag/Given 09/23/22 2040)     IMPRESSION / MDM / ASSESSMENT AND PLAN / ED COURSE  I reviewed the triage vital signs and the nursing  notes.  61 year old female with PMH as noted above presents with shortness of breath for the last 2 days, acutely worsened today.  She was found to be significantly hypoxic and tachypneic by EMS and was placed on CPAP.  Exam reveals diminished breath sounds and wheezing.  The patient is tachycardic but afebrile.  The patient was given Solu-Medrol, magnesium, and DuoNebs by EMS.  Differential diagnosis includes, but is not limited to, COPD exacerbation, pneumonia, COVID-19, influenza, other viral etiology, less likely cardiac cause.  We will obtain chest x-ray, lab workup, continue bronchodilators, and reassess.  The patient presented on CPAP and we initially placed her on BiPAP here.  However, the patient appeared extremely anxious and stated that the mask was too uncomfortable and hot for her.  The patient was speaking in full sentences and was alert.  I explained to her the reasoning for having her on BiPAP and the fact that it is actually helping her condition rather than just giving her oxygen but agreed to do a trial of nasal cannula instead.  I warned the patient about the risk of respiratory decompensation and that we would need to intubate if she was unable to contain oxygenation or started to have inadequate respiratory effort.  The patient understands this risk.  We will continue to monitor closely and restart BiPAP if needed.  Patient's presentation is most consistent with acute presentation with potential threat to life or bodily function.  The patient is on the cardiac monitor to evaluate for evidence of arrhythmia and/or significant heart rate changes.  ----------------------------------------- 8:22 PM on 09/23/2022 -----------------------------------------  Chest x-ray shows bilateral interstitial opacities.  The patient is positive for influenza A.  Lactate is elevated although at this time I have a lower suspicion for sepsis given that the patient's tachycardia is improving, she has  no hypotension or fever, and the lactic acidosis is more consistent with the patient's acute respiratory distress and work of breathing.  We will give fluids and recheck.  ----------------------------------------- 11:00 PM on 09/23/2022 -----------------------------------------  The patient remains stable on nasal cannula.  I consulted Dr. Elvia Collum from the hospitalist service; based on our discussion he agrees to admit the patient.     FINAL CLINICAL IMPRESSION(S) / ED DIAGNOSES   Final diagnoses:  COPD exacerbation ([place])  Acute on chronic respiratory failure with hypoxia ([place])  Influenza A     Rx / DC Orders   ED Discharge Orders     None        Note:  This document was prepared using Dragon voice recognition software and may include unintentional dictation errors.    Arta Silence, MD 09/24/22 [phone]

## 2022-09-23 NOTE — Assessment & Plan Note (Signed)
Advised to quit, nicotine gums

## 2022-09-23 NOTE — H&P (Signed)
History and Physical    Patient: Cindy Gutierrez Cindy Gutierrez DOB: [birth year] DOA: 09/23/2022 DOS: the patient was seen and examined on 09/23/2022 [facility]  Patient coming from: Home  Chief Complaint:  Chief Complaint  Patient presents with   Respiratory Distress   HPI: Cindy Gutierrez is a 61 y.o. female with medical history significant of COPD.  Patient was in her usual state of health till yesterday.  Since yesterday patient has had an insidious onset of dry cough associated with sensation of "tightness "in the chest.  Patient has also had progressive exertional shortness of breath.  Patient shortness of breath got so bad today that she was short of breath even at rest.  There is no associated leg swelling/cramping/fever/chest pain/palpitations/presyncope.  Patient does not recall similar episodes in the past.  Patient came to the ER because of severe shortness of breath  At presentation to the ER, patient is found to be in marked respiratory distress and stabilized on bilevel positive airway pressure.  However patient declined to tolerate it and has since then been maintained on 6 L/min of supplemental oxygen by nasal cannula.  Patient reports feeling better since arrival to the ER, however still having sensation of shortness of breath Review of Systems: As mentioned in the history of present illness. All other systems reviewed and are negative. Past Medical History:  Diagnosis Date   COPD (chronic obstructive pulmonary disease) ([place])    Hepatitis C    Hypertension    Thyroid disease    Past Surgical History:  Procedure Laterality Date   COLONOSCOPY WITH PROPOFOL N/A 08/21/2018   Procedure: COLONOSCOPY WITH PROPOFOL;  Surgeon: Lin Landsman, MD;  Location: [facility] ENDOSCOPY;  Service: Gastroenterology;  Laterality: N/A;   PARATHYROIDECTOMY Left    TUBAL LIGATION     Social History:  reports that she has been smoking cigarettes. She has a 11.25 pack-year  smoking history. She has never used smokeless tobacco. She reports that she does not currently use alcohol. She reports that she does not use drugs.  Allergies  Allergen Reactions   Sulfa Antibiotics Hives and Rash   Bee Pollen Hives   Lisinopril Nausea Only   Losartan    Penicillins Hives   Tiotropium Bromide Monohydrate     Other reaction(s): Cough   Carvedilol     Family History  Problem Relation Age of Onset   Alzheimer's disease Mother    Heart disease Father     Prior to Admission medications   Medication Sig Start Date End Date Taking? Authorizing Provider  albuterol (PROVENTIL) (2.5 MG/3ML) 0.083% nebulizer solution Take 2.5 mg by nebulization every 6 (six) hours as needed for wheezing or shortness of breath.   Yes [provider]  amLODipine (NORVASC) 10 MG tablet Take 10 mg by mouth daily.   Yes [provider]  aspirin 81 MG chewable tablet Chew 81 mg by mouth daily. 04/13/22  Yes [provider]  budesonide-formoterol (SYMBICORT) 160-4.5 MCG/ACT inhaler Inhale 2 puffs into the lungs 2 (two) times daily.   Yes [provider]  chlorthalidone (HYGROTON) 25 MG tablet Take 12.5 mg by mouth daily.   Yes [provider]  Cholecalciferol 25 MCG (1000 UT) tablet Take 3,000 Units by mouth daily. 06/22/22  Yes [provider]  diclofenac Sodium (VOLTAREN) 1 % GEL Apply 2 g topically 4 (four) times daily. 09/21/22  Yes [provider]  gabapentin (NEURONTIN) 300 MG capsule Take 300 mg by mouth at bedtime.  09/21/22  Yes [provider]  Glecaprevir-Pibrentasvir (MAVYRET) 100-40 MG TABS Take 3 tablets by mouth daily.   Yes [provider]  methylPREDNISolone (MEDROL DOSEPAK) 4 MG TBPK tablet Take 4-24 mg by mouth daily. 09/21/22  Yes [provider]  nicotine (NICODERM CQ - DOSED IN MG/24 HOURS) 14 mg/24hr patch Place 14 mg onto the skin daily. 04/13/22  Yes [provider]  OVER THE  COUNTER MEDICATION Take 1 Dose by mouth daily. IRISH SEA MOSS SUPPLEMENT   Yes [provider]  terbinafine (LAMISIL) 250 MG tablet Take 250 mg by mouth daily. 09/28/21  Yes [provider]  albuterol (PROVENTIL HFA;VENTOLIN HFA) 108 (90 Base) MCG/ACT inhaler Inhale 1-2 puffs into the lungs every 6 (six) hours as needed for wheezing or shortness of breath. 06/26/16   Hagler, Jami L, PA-C  Budeson-Glycopyrrol-Formoterol (BREZTRI AEROSPHERE) 160-9-4.8 MCG/ACT AERO Inhale 2 puffs into the lungs 2 times daily at 12 noon and 4 pm. Patient not taking: Reported on 09/23/2022    [provider]  calcium carbonate (TUMS - DOSED IN MG ELEMENTAL CALCIUM) 500 MG chewable tablet Chew 1 tablet by mouth daily. As needed    [provider]  calcium citrate (CALCITRATE - DOSED IN MG ELEMENTAL CALCIUM) 950 (200 Ca) MG tablet Take 200 mg of elemental calcium by mouth daily. Patient not taking: Reported on 09/23/2022    [provider]  Fluticasone-Salmeterol (ADVAIR) 250-50 MCG/DOSE AEPB Inhale 1 puff into the lungs 2 (two) times daily. Patient not taking: Reported on 09/23/2022    [provider]  FUROSEMIDE PO Take 20 mg by mouth daily. Patient not taking: Reported on 08/30/2022    [provider]  guaiFENesin (MUCINEX) 600 MG 12 hr tablet Take by mouth 2 (two) times daily as needed.    [provider]  guaiFENesin (ROBITUSSIN) 100 MG/5ML liquid Take 5 mLs by mouth every 4 (four) hours as needed for cough or to loosen phlegm.    [provider]  levofloxacin (LEVAQUIN) 500 MG tablet Take 1 tablet (500 mg total) by mouth daily. Patient not taking: Reported on 08/30/2022 05/26/21   Gwynne Edinger, MD  predniSONE (DELTASONE) 10 MG tablet Take 6 tabs p.o. on day 1 and decrease by 1 tablet daily until complete Patient not taking: Reported on 09/23/2022 09/10/22   Danton Clap, PA-C  Tiotropium Bromide Monohydrate (SPIRIVA RESPIMAT IN)  Inhale 1 puff into the lungs in the morning and at bedtime. Patient not taking: Reported on 05/23/2021    [provider]  umeclidinium bromide (INCRUSE ELLIPTA) 62.5 MCG/INH AEPB Inhale 1 puff into the lungs daily. Patient not taking: Reported on 08/30/2022    [provider]    Physical Exam: Vitals:   09/23/22 2100 09/23/22 2115 09/23/22 2130 09/23/22 2131  BP:    (!) 165/57  Pulse: 92 92 85 86  Resp: (!) 23 (!) 30 (!) 30 (!) 32  Temp:      SpO2: 100% 100% 100% 100%  Weight:      Height:       Patient is demonstrating mild to moderate degree of respiratory distress and the says that she is sitting in the stretcher and having supraclavicular retractions and use of some accessory muscles.  However patient is able to talk calmly and give coherent history.  And is not anxious at this time Respiratory exam: Diffusely diminished breath sounds with expiratory wheeze occasional As well as to normal on cardiovascular exam Abdomen soft nontender  Extremities warm no edema Neurologically: Alert awake oriented x 3 no focal deficit  Data Reviewed: Results for orders placed or performed during the hospital encounter of 09/23/22 (from the past 24 hour(s))  Blood gas, venous     Status: Abnormal   Collection Time: 09/23/22  7:10 PM  Result Value Ref Range   pH, Ven 7.4 7.25 - 7.43   pCO2, Ven 49 44 - 60 mmHg   pO2, Ven 66 (H) 32 - 45 mmHg   Bicarbonate 30.4 (H) 20.0 - 28.0 mmol/L   Acid-Base Excess 4.5 (H) 0.0 - 2.0 mmol/L   O2 Saturation 94.4 %   Patient temperature 37.0    Collection site VEIN   Brain natriuretic peptide     Status: None   Collection Time: 09/23/22  7:27 PM  Result Value Ref Range   B Natriuretic Peptide 64.9 0.0 - 100.0 pg/mL  Resp panel by RT-PCR (RSV, Flu A&B, Covid) Anterior Nasal Swab     Status: Abnormal   Collection Time: 09/23/22  7:27 PM   Specimen: Anterior Nasal Swab  Result Value Ref Range   SARS Coronavirus 2 by RT PCR NEGATIVE NEGATIVE    Influenza A by PCR POSITIVE (A) NEGATIVE   Influenza B by PCR NEGATIVE NEGATIVE   Resp Syncytial Virus by PCR NEGATIVE NEGATIVE  Comprehensive metabolic panel     Status: Abnormal   Collection Time: 09/23/22  7:28 PM  Result Value Ref Range   Sodium 139 135 - 145 mmol/L   Potassium 3.3 (L) 3.5 - 5.1 mmol/L   Chloride 102 98 - 111 mmol/L   CO2 26 22 - 32 mmol/L   Glucose, Bld 249 (H) 70 - 99 mg/dL   BUN 12 8 - 23 mg/dL   Creatinine, Ser 0.73 0.44 - 1.00 mg/dL   Calcium 8.6 (L) 8.9 - 10.3 mg/dL   Total Protein 7.6 6.5 - 8.1 g/dL   Albumin 3.9 3.5 - 5.0 g/dL   AST 28 15 - 41 U/L   ALT 18 0 - 44 U/L   Alkaline Phosphatase 100 38 - 126 U/L   Total Bilirubin 0.7 0.3 - 1.2 mg/dL   GFR, Estimated >60 >60 mL/min   Anion gap 11 5 - 15  CBC with Differential     Status: Abnormal   Collection Time: 09/23/22  7:28 PM  Result Value Ref Range   WBC 10.2 4.0 - 10.5 K/uL   RBC 4.98 3.87 - 5.11 MIL/uL   Hemoglobin 13.6 12.0 - 15.0 g/dL   HCT 43.1 36.0 - 46.0 %   MCV 86.5 80.0 - 100.0 fL   MCH 27.3 26.0 - 34.0 pg   MCHC 31.6 30.0 - 36.0 g/dL   RDW 14.1 11.5 - 15.5 %   Platelets 323 150 - 400 K/uL   nRBC 0.0 0.0 - 0.2 %   Neutrophils Relative % 95 %   Neutro Abs 9.6 (H) 1.7 - 7.7 K/uL   Lymphocytes Relative 4 %   Lymphs Abs 0.4 (L) 0.7 - 4.0 K/uL   Monocytes Relative 1 %   Monocytes Absolute 0.1 0.1 - 1.0 K/uL   Eosinophils Relative 0 %   Eosinophils Absolute 0.0 0.0 - 0.5 K/uL   Basophils Relative 0 %   Basophils Absolute 0.0 0.0 - 0.1 K/uL   Immature Granulocytes 0 %   Abs Immature Granulocytes 0.03 0.00 - 0.07 K/uL  Troponin I (High Sensitivity)     Status: None   Collection Time: 09/23/22  7:28  PM  Result Value Ref Range   Troponin I (High Sensitivity) 8 <18 ng/L  Lactic acid, plasma     Status: Abnormal   Collection Time: 09/23/22  7:28 PM  Result Value Ref Range   Lactic Acid, Venous 3.1 (HH) 0.5 - 1.9 mmol/L  Ammonia     Status: None   Collection Time: 09/23/22  7:28 PM   Result Value Ref Range   Ammonia 19 9 - 35 umol/L  Lactic acid, plasma     Status: Abnormal   Collection Time: 09/23/22  9:33 PM  Result Value Ref Range   Lactic Acid, Venous 4.8 (HH) 0.5 - 1.9 mmol/L  Troponin I (High Sensitivity)     Status: None   Collection Time: 09/23/22  9:33 PM  Result Value Ref Range   Troponin I (High Sensitivity) 8 <18 ng/L    There are no new results to review at this time.  IMPRESSION: 1. Mild diffuse bilateral interstitial pulmonary opacity, which may reflect edema or atypical/viral infection. No focal airspace opacity.   2.  Emphysema.  EKG reviewed   Assessment and Plan: * COPD with acute exacerbation (HCC) Intravenous Solu-Medrol, albuterol, IV antibiotics including oseltamivir  Acute respiratory failure with hypoxia ([place]) Patient is still demonstrating at least mild to moderate degree of respiratory distress.  However patient at this time is declining to use BiPAP.  We will keep this available as needed.  Continue with supplemental oxygen and continuous pulse oximetry  Lactic acidosis I suspect this is due to patient's respiratory status.  Rather than any uncontrolled sepsis or hypotension.  However we will continue with IV fluids and trend  Hyperglycemia Insulin n sliding scale  Onychomycosis Hold terbinafine.  Nicotine dependence Advised to quit, nicotine gums  Influenza A with pneumonia Oseltamivir  Essential hypertension Keep systolic blood pressure under 160      Advance Care Planning:   Code Status: Prior full  Consults:   Family Communication:   Severity of Illness: The appropriate patient status for this patient is INPATIENT. Inpatient status is judged to be reasonable and necessary in order to provide the required intensity of service to ensure the patient's safety. The patient's presenting symptoms, physical exam findings, and initial radiographic and laboratory data in the context of their chronic comorbidities  is felt to place them at high risk for further clinical deterioration. Furthermore, it is not anticipated that the patient will be medically stable for discharge from the hospital within 2 midnights of admission.   * I certify that at the point of admission it is my clinical judgment that the patient will require inpatient hospital care spanning beyond 2 midnights from the point of admission due to high intensity of service, high risk for further deterioration and high frequency of surveillance required.*  Author: Gertie Fey, MD 09/23/2022 10:02 PM  For on call review [email or web address].

## 2022-09-23 NOTE — Assessment & Plan Note (Signed)
Hold terbinafine.

## 2022-09-23 NOTE — Assessment & Plan Note (Signed)
Oseltamivir

## 2022-09-24 ENCOUNTER — Inpatient Hospital Stay: Payer: No Typology Code available for payment source

## 2022-09-24 ENCOUNTER — Encounter: Payer: Self-pay | Admitting: Internal Medicine

## 2022-09-24 DIAGNOSIS — R6520 Severe sepsis without septic shock: Secondary | ICD-10-CM

## 2022-09-24 DIAGNOSIS — J09X1 Influenza due to identified novel influenza A virus with pneumonia: Secondary | ICD-10-CM | POA: Diagnosis not present

## 2022-09-24 DIAGNOSIS — J9601 Acute respiratory failure with hypoxia: Secondary | ICD-10-CM

## 2022-09-24 DIAGNOSIS — J441 Chronic obstructive pulmonary disease with (acute) exacerbation: Secondary | ICD-10-CM

## 2022-09-24 DIAGNOSIS — A419 Sepsis, unspecified organism: Secondary | ICD-10-CM | POA: Diagnosis not present

## 2022-09-24 LAB — BLOOD GAS, ARTERIAL
Acid-base deficit: 1.7 mmol/L (ref 0.0–2.0)
Bicarbonate: 23 mmol/L (ref 20.0–28.0)
FIO2: 100 %
O2 Saturation: 99.8 %
Patient temperature: 37
pCO2 arterial: 38 mmHg (ref 32–48)
pH, Arterial: 7.39 (ref 7.35–7.45)
pO2, Arterial: 227 mmHg — ABNORMAL HIGH (ref 83–108)

## 2022-09-24 LAB — URINALYSIS, ROUTINE W REFLEX MICROSCOPIC
Bilirubin Urine: NEGATIVE
Glucose, UA: >=500 mg/dL — AB
Hgb urine dipstick: NEGATIVE
Ketones, ur: NEGATIVE mg/dL
Leukocytes,Ua: NEGATIVE
Nitrite: NEGATIVE
Protein, ur: NEGATIVE mg/dL
Specific Gravity, Urine: 1.016 (ref 1.005–1.030)
pH: 5 (ref 5.0–8.0)

## 2022-09-24 LAB — CBC
HCT: 40.4 % (ref 36.0–46.0)
HCT: 42.8 % (ref 36.0–46.0)
Hemoglobin: 12.6 g/dL (ref 12.0–15.0)
Hemoglobin: 13.3 g/dL (ref 12.0–15.0)
MCH: 27.5 pg (ref 26.0–34.0)
MCH: 27.5 pg (ref 26.0–34.0)
MCHC: 31.1 g/dL (ref 30.0–36.0)
MCHC: 31.2 g/dL (ref 30.0–36.0)
MCV: 88 fL (ref 80.0–100.0)
MCV: 88.4 fL (ref 80.0–100.0)
Platelets: 271 10*3/uL (ref 150–400)
Platelets: 293 10*3/uL (ref 150–400)
RBC: 4.59 MIL/uL (ref 3.87–5.11)
RBC: 4.84 MIL/uL (ref 3.87–5.11)
RDW: 14.1 % (ref 11.5–15.5)
RDW: 14.1 % (ref 11.5–15.5)
WBC: 10.4 10*3/uL (ref 4.0–10.5)
WBC: 9.8 10*3/uL (ref 4.0–10.5)
nRBC: 0 % (ref 0.0–0.2)
nRBC: 0 % (ref 0.0–0.2)

## 2022-09-24 LAB — CBG MONITORING, ED
Glucose-Capillary: 138 mg/dL — ABNORMAL HIGH (ref 70–99)
Glucose-Capillary: 146 mg/dL — ABNORMAL HIGH (ref 70–99)
Glucose-Capillary: 148 mg/dL — ABNORMAL HIGH (ref 70–99)
Glucose-Capillary: 192 mg/dL — ABNORMAL HIGH (ref 70–99)
Glucose-Capillary: 202 mg/dL — ABNORMAL HIGH (ref 70–99)

## 2022-09-24 LAB — COMPREHENSIVE METABOLIC PANEL
ALT: 18 U/L (ref 0–44)
AST: 31 U/L (ref 15–41)
Albumin: 3.6 g/dL (ref 3.5–5.0)
Alkaline Phosphatase: 93 U/L (ref 38–126)
Anion gap: 11 (ref 5–15)
BUN: 11 mg/dL (ref 8–23)
CO2: 24 mmol/L (ref 22–32)
Calcium: 8 mg/dL — ABNORMAL LOW (ref 8.9–10.3)
Chloride: 106 mmol/L (ref 98–111)
Creatinine, Ser: 0.82 mg/dL (ref 0.44–1.00)
GFR, Estimated: >60 mL/min (ref >60)
Glucose, Bld: 160 mg/dL — ABNORMAL HIGH (ref 70–99)
Potassium: 3.9 mmol/L (ref 3.5–5.1)
Sodium: 141 mmol/L (ref 135–145)
Total Bilirubin: 0.6 mg/dL (ref 0.3–1.2)
Total Protein: 7.5 g/dL (ref 6.5–8.1)

## 2022-09-24 LAB — PROTIME-INR
INR: 1.1 (ref 0.8–1.2)
Prothrombin Time: 14 seconds (ref 11.4–15.2)

## 2022-09-24 LAB — PROCALCITONIN: Procalcitonin: <0.1 ng/mL

## 2022-09-24 LAB — CREATININE, SERUM
Creatinine, Ser: 0.77 mg/dL (ref 0.44–1.00)
GFR, Estimated: >60 mL/min (ref >60)

## 2022-09-24 LAB — TSH: TSH: 0.08 u[IU]/mL — ABNORMAL LOW (ref 0.350–4.500)

## 2022-09-24 LAB — APTT: aPTT: 29 seconds (ref 24–36)

## 2022-09-24 LAB — D-DIMER, QUANTITATIVE: D-Dimer, Quant: 0.46 ug/mL-FEU (ref 0.00–0.50)

## 2022-09-24 MED ORDER — IPRATROPIUM-ALBUTEROL 0.5-2.5 (3) MG/3ML IN SOLN
3.0000 mL | RESPIRATORY_TRACT | Status: DC
  Administered 2022-09-24 – 2022-09-26 (×11): 3 mL via RESPIRATORY_TRACT
  Filled 2022-09-24 (×12): qty 3

## 2022-09-24 MED ORDER — FUROSEMIDE 10 MG/ML IJ SOLN
INTRAMUSCULAR | Status: AC
  Administered 2022-09-24: 40 mg via INTRAVENOUS
  Filled 2022-09-24: qty 4

## 2022-09-24 MED ORDER — IPRATROPIUM-ALBUTEROL 0.5-2.5 (3) MG/3ML IN SOLN
3.0000 mL | RESPIRATORY_TRACT | Status: DC | PRN
  Administered 2022-09-24 – 2022-09-25 (×2): 3 mL via RESPIRATORY_TRACT
  Filled 2022-09-24: qty 3

## 2022-09-24 MED ORDER — LORAZEPAM 2 MG/ML IJ SOLN
INTRAMUSCULAR | Status: AC
  Administered 2022-09-24: 1 mg via INTRAVENOUS
  Filled 2022-09-24: qty 1

## 2022-09-24 MED ORDER — LORAZEPAM 2 MG/ML IJ SOLN: 1.0000 mg | Freq: Once | INTRAMUSCULAR | Status: AC

## 2022-09-24 MED ORDER — OSELTAMIVIR PHOSPHATE 75 MG PO CAPS
75.0000 mg | ORAL_CAPSULE | Freq: Two times a day (BID) | ORAL | Status: DC
  Administered 2022-09-24 – 2022-09-28 (×8): 75 mg via ORAL
  Filled 2022-09-24 (×10): qty 1

## 2022-09-24 MED ORDER — LORAZEPAM 2 MG/ML IJ SOLN: 0.5000 mg | Freq: Once | INTRAMUSCULAR | Status: AC

## 2022-09-24 MED ORDER — LORAZEPAM 2 MG/ML IJ SOLN
INTRAMUSCULAR | Status: AC
  Administered 2022-09-24: 0.5 mg via INTRAVENOUS
  Filled 2022-09-24: qty 1

## 2022-09-24 MED ORDER — FUROSEMIDE 10 MG/ML IJ SOLN: 40.0000 mg | Freq: Once | INTRAMUSCULAR | Status: AC

## 2022-09-24 NOTE — ED Notes (Addendum)
Pt given 2 warm blankets, pt breathing has improved since being placed on BI-PAP with smaller simple nasal mask and ativan administer, pt reports she feels better, but is tired, denies any CP at current. RR even at current, pt appears comfortable and relaxed.

## 2022-09-24 NOTE — Progress Notes (Addendum)
       CROSS COVER NOTE  NAME: KINZA GOUVEIA MRN: [id number] DOB : [birth year]   HPI/Events of Note   Patient had developed significant respiratory distress. She had significant use of accessory muscles and highly anxious. Nurse initially placed patient on NRB   Assessment and  Interventions   Assessment: Highly anxious and verbalized feeling tired. However A & O x4 and refused BIPAP initially and refused intubation even after discussing code status again. Pulm - rate 40 xs a minute labored  BBS diminished - poor air entry - significan thyroid enlargement  CV tachycardic Plan: Chest x ray repeated 40 lasix + IV fluids stopped Bipap with nasal mask Total dose of 1.5 mg ativan given for anxiety Nebs changed to duonebs from albuterol Ddimer and procal done - normal TSH pending Discontinued antibiotics Consider repeat chest CT for reevaluation of pulmonary nodule on last CT Consider pulmonology consult for maximizing COPD treatment        Kathlene Cote NP [facility]

## 2022-09-24 NOTE — Progress Notes (Signed)
OT Cancellation Note  Patient Details Name: Cindy Gutierrez MRN: [id number] DOB: 1960-11-08   Cancelled Treatment:    Reason Eval/Treat Not Completed: Patient not medically ready  Per chart review, pt still on bipap with increased SOB. Exertional activity not recommended at this time. Discussed with RN, will re-attempt OT evaluation next date if medically stable.    Rosalyn Gess OTR/L,CLT 09/24/2022, 9:26 AM

## 2022-09-24 NOTE — ED Notes (Signed)
Request made for regular bed per B. Randol Kern

## 2022-09-24 NOTE — ED Notes (Signed)
Rn to bedside to assess and introduce self to pt. Pt sleeping at this time with nasal BIPAP in place.

## 2022-09-24 NOTE — Progress Notes (Signed)
PT Cancellation Note  Patient Details Name: Cindy Gutierrez MRN: [id number] DOB: [birth year]   Cancelled Treatment:    Reason Eval/Treat Not Completed: Other (comment). Per chart review, pt still on bipap with increased SOB. Exertional activity not recommended at this time. Discussed with RN, will re-attempt PT evaluation next date if medically stable.   Joy Reiger 09/24/2022, 9:20 AM Greggory Stallion, PT, DPT, GCS [phone]

## 2022-09-24 NOTE — Progress Notes (Signed)
Pt taken off bipap and placed 4lpm [place], sats 98%, respiratory rate 18/min. Tolerating well at this time.

## 2022-09-24 NOTE — Progress Notes (Addendum)
Progress Note    Cindy Gutierrez  JSE:[id number] DOB: August 25, 1961  DOA: 09/23/2022 [facility]      Brief Narrative:    Medical records reviewed and are as summarized below:  Cindy Gutierrez is a 61 y.o. female with medical history significant for COPD, hepatitis C, hypertension, depression, pulmonary hypertension, nontoxic multinodular goiter, hyperparathyroidism, depression, aortic regurgitation, mitral regurgitation, who presented to the hospital with shortness of breath of 2 days duration.  She was hypoxic with oxygen saturation of 80% on room air when EMS picked her up.  She was placed on nonrebreathing mask and subsequently on CPAP she was brought to the emergency department for further management.   She was found to have severe sepsis secondary to influenza A infection, COPD exacerbation complicated by acute hypoxic respiratory failure.  She was treated with BiPAP, Tamiflu and IV fluids.      Assessment/Plan:   Principal Problem:   Severe sepsis (HCC) Active Problems:   COPD exacerbation (HCC)   Influenza A with pneumonia   Acute respiratory failure with hypoxia (HCC)   COPD with acute exacerbation (HCC)   Essential hypertension   Nicotine dependence   Onychomycosis   Hyperglycemia   Lactic acidosis    Body mass index is 23.53 kg/m.  Severe sepsis secondary to influenza A infection, lactic acidosis: Continue Tamiflu.  No need for maintenance IV fluids since blood pressure is elevated.  COPD exacerbation: Continue prednisone and bronchodilators  Acute hypoxic respiratory failure: Continue BiPAP and wean off as able  Hypertensive urgency: Continue amlodipine and chlorthalidone  Other comorbidities include hepatitis C on Glecaprevir-Pibentasvir, onychomycosis on Lamisil      Diet Order             Diet clear liquid Room service appropriate? Yes; Fluid consistency: Thin  Diet effective now                             Consultants: None  Procedures: None    Medications:    amLODipine  10 mg Oral Daily   aspirin  81 mg Oral Daily   chlorthalidone  12.5 mg Oral Daily   enoxaparin (LOVENOX) injection  40 mg Subcutaneous Q24H   gabapentin  300 mg Oral QHS   Glecaprevir-Pibrentasvir  3 tablet Oral Daily   insulin aspart  0-15 Units Subcutaneous TID WC   insulin aspart  0-5 Units Subcutaneous QHS   ipratropium-albuterol  3 mL Nebulization Q4H   nicotine  14 mg Transdermal Daily   oseltamivir  75 mg Oral BID   pantoprazole (PROTONIX) IV  40 mg Intravenous Q24H   [START ON 09/25/2022] predniSONE  40 mg Oral Q breakfast   umeclidinium bromide  1 puff Inhalation Daily   Continuous Infusions:   Anti-infectives (From admission, onward)    Start     Dose/Rate Route Frequency Ordered Stop   09/24/22 1130  oseltamivir (TAMIFLU) capsule 75 mg        75 mg Oral 2 times daily 09/24/22 1125 09/28/22 2159   09/24/22 1000  Glecaprevir-Pibrentasvir 100-40 MG TABS 3 tablet        3 tablet Oral Daily 09/23/22 2206     09/23/22 2215  cefTRIAXone (ROCEPHIN) 1 g in sodium chloride 0.9 % 100 mL IVPB  Status:  Discontinued        1 g 200 mL/hr over 30 Minutes Intravenous Every 24 hours 09/23/22 2200 09/24/22 0519   09/23/22  2215  azithromycin (ZITHROMAX) 500 mg in sodium chloride 0.9 % 250 mL IVPB  Status:  Discontinued        500 mg 250 mL/hr over 60 Minutes Intravenous Every 24 hours 09/23/22 2200 09/24/22 0519   09/23/22 2100  oseltamivir (TAMIFLU) capsule 75 mg        75 mg Oral  Once 09/23/22 2057 09/24/22 0030              Family Communication/Anticipated D/C date and plan/Code Status   DVT prophylaxis: enoxaparin (LOVENOX) injection 40 mg Start: 09/23/22 2215 SCDs Start: 09/23/22 2207     Code Status: Full Code  Family Communication: None Disposition Plan: Plan to discharge home in 2 to 3 days   Status is: Inpatient Remains inpatient appropriate because: Hypoxic  respiratory failure       Subjective:   Interval events noted.  She complains of cough and shortness of breath  Objective:    Vitals:   09/24/22 0800 09/24/22 0900 09/24/22 1000 09/24/22 1100  BP: (!) 153/60 (!) 158/66 (!) 149/63 (!) 170/66  Pulse: 84  87 88  Resp: (!) 24 19 (!) 25 (!) 23  Temp:  99 F (37.2 C)    TempSrc:  Oral    SpO2: 100% 97% 97% 98%  Weight:      Height:       No data found.   Intake/Output Summary (Last 24 hours) at 09/24/2022 1136 Last data filed at 09/24/2022 0934 Gross per 24 hour  Intake 2006.02 ml  Output 850 ml  Net 1156.02 ml   Filed Weights   09/23/22 1919  Weight: 74.4 kg    Exam:  GEN: NAD SKIN: Warm and dry EYES: EOMI ENT: MMM CV: RRR PULM: On BiPAP, decreased air entry bilaterally, bilateral expiratory wheezes ABD: soft, ND, NT, +BS CNS: AAO x 3, non focal EXT: No edema or tenderness        Data Reviewed:   I have personally reviewed following labs and imaging studies:  Labs: Labs show the following:   Basic Metabolic Panel: Recent Labs  Lab 09/23/22 1928 09/24/22 0012 09/24/22 0448  NA 139  --  141  K 3.3*  --  3.9  CL 102  --  106  CO2 26  --  24  GLUCOSE 249*  --  160*  BUN 12  --  11  CREATININE 0.73 0.77 0.82  CALCIUM 8.6*  --  8.0*   GFR Estimated Creatinine Clearance: 76.9 mL/min (by C-G formula based on SCr of 0.82 mg/dL). Liver Function Tests: Recent Labs  Lab 09/23/22 1928 09/24/22 0448  AST 28 31  ALT 18 18  ALKPHOS 100 93  BILITOT 0.7 0.6  PROT 7.6 7.5  ALBUMIN 3.9 3.6   No results for input(s): "LIPASE", "AMYLASE" in the last 168 hours. Recent Labs  Lab 09/23/22 1928  AMMONIA 19   Coagulation profile Recent Labs  Lab 09/24/22 0012  INR 1.1    CBC: Recent Labs  Lab 09/23/22 1928 09/24/22 0012 09/24/22 0448  WBC 10.2 9.8 10.4  NEUTROABS 9.6*  --   --   HGB 13.6 13.3 12.6  HCT 43.1 42.8 40.4  MCV 86.5 88.4 88.0  PLT 323 293 271   Cardiac Enzymes: No  results for input(s): "CKTOTAL", "CKMB", "CKMBINDEX", "TROPONINI" in the last 168 hours. BNP (last 3 results) No results for input(s): "PROBNP" in the last 8760 hours. CBG: Recent Labs  Lab 09/24/22 0039 09/24/22 0741  GLUCAP 202* 148*  D-Dimer: Recent Labs    09/24/22 0207  DDIMER 0.46   Hgb A1c: No results for input(s): "HGBA1C" in the last 72 hours. Lipid Profile: No results for input(s): "CHOL", "HDL", "LDLCALC", "TRIG", "CHOLHDL", "LDLDIRECT" in the last 72 hours. Thyroid function studies: Recent Labs    09/24/22 0447  TSH 0.080*   Anemia work up: No results for input(s): "VITAMINB12", "FOLATE", "FERRITIN", "TIBC", "IRON", "RETICCTPCT" in the last 72 hours. Sepsis Labs: Recent Labs  Lab 09/23/22 1928 09/23/22 2133 09/24/22 0012 09/24/22 0448  PROCALCITON  --   --  <0.10  --   WBC 10.2  --  9.8 10.4  LATICACIDVEN 3.1* 4.8*  --   --     Microbiology Recent Results (from the past 240 hour(s))  Resp panel by RT-PCR (RSV, Flu A&B, Covid) Anterior Nasal Swab     Status: Abnormal   Collection Time: 09/23/22  7:27 PM   Specimen: Anterior Nasal Swab  Result Value Ref Range Status   SARS Coronavirus 2 by RT PCR NEGATIVE NEGATIVE Final    Comment: (NOTE) SARS-CoV-2 target nucleic acids are NOT DETECTED.  The SARS-CoV-2 RNA is generally detectable in upper respiratory specimens during the acute phase of infection. The lowest concentration of SARS-CoV-2 viral copies this assay can detect is 138 copies/mL. A negative result does not preclude SARS-Cov-2 infection and should not be used as the sole basis for treatment or other patient management decisions. A negative result may occur with  improper specimen collection/handling, submission of specimen other than nasopharyngeal swab, presence of viral mutation(s) within the areas targeted by this assay, and inadequate number of viral copies(<138 copies/mL). A negative result must be combined with clinical observations,  patient history, and epidemiological information. The expected result is Negative.  Fact Sheet for Patients:  EntrepreneurPulse.com.au  Fact Sheet for Healthcare Providers:  IncredibleEmployment.be  This test is no t yet approved or cleared by the [place] FDA and  has been authorized for detection and/or diagnosis of SARS-CoV-2 by FDA under an Emergency Use Authorization (EUA). This EUA will remain  in effect (meaning this test can be used) for the duration of the COVID-19 declaration under Section 564(b)(1) of the Act, 21 U.S.C.section 360bbb-3(b)(1), unless the authorization is terminated  or revoked sooner.       Influenza A by PCR POSITIVE (A) NEGATIVE Final   Influenza B by PCR NEGATIVE NEGATIVE Final    Comment: (NOTE) The Xpert Xpress SARS-CoV-2/FLU/RSV plus assay is intended as an aid in the diagnosis of influenza from Nasopharyngeal swab specimens and should not be used as a sole basis for treatment. Nasal washings and aspirates are unacceptable for Xpert Xpress SARS-CoV-2/FLU/RSV testing.  Fact Sheet for Patients: EntrepreneurPulse.com.au  Fact Sheet for Healthcare Providers: IncredibleEmployment.be  This test is not yet approved or cleared by the [place] FDA and has been authorized for detection and/or diagnosis of SARS-CoV-2 by FDA under an Emergency Use Authorization (EUA). This EUA will remain in effect (meaning this test can be used) for the duration of the COVID-19 declaration under Section 564(b)(1) of the Act, 21 U.S.C. section 360bbb-3(b)(1), unless the authorization is terminated or revoked.     Resp Syncytial Virus by PCR NEGATIVE NEGATIVE Final    Comment: (NOTE) Fact Sheet for Patients: EntrepreneurPulse.com.au  Fact Sheet for Healthcare Providers: IncredibleEmployment.be  This test is not yet approved or cleared by the [place] FDA and has been authorized for detection and/or diagnosis of SARS-CoV-2 by FDA under an Emergency Use Authorization (  EUA). This EUA will remain in effect (meaning this test can be used) for the duration of the COVID-19 declaration under Section 564(b)(1) of the Act, 21 U.S.C. section 360bbb-3(b)(1), unless the authorization is terminated or revoked.  Performed at [facility], [address] [place], [place] [address]     Procedures and diagnostic studies:  DG Chest [facility] 1 View  Result Date: 09/24/2022 CLINICAL DATA:  Respiratory distress EXAM: PORTABLE CHEST 1 VIEW COMPARISON:  Radiographs 09/23/2022 FINDINGS: Stable cardiomediastinal silhouette. No focal consolidation, pleural effusion, or pneumothorax. Hyperinflation and interstitial coarsening. Scarring/atelectasis left lower lung. No acute osseous abnormality. Remote left rib fractures. Surgical clips right neck. IMPRESSION: No active disease.  Emphysema. Electronically Signed   By: Placido Sou M.D.   On: 09/24/2022 01:53   DG Chest Port 1 View  Result Date: 09/23/2022 CLINICAL DATA:  Shortness of breath EXAM: PORTABLE CHEST 1 VIEW COMPARISON:  07/15/2021 FINDINGS: The heart size and mediastinal contours are within normal limits. Emphysema. Mild diffuse bilateral interstitial pulmonary opacity. The visualized skeletal structures are unremarkable. IMPRESSION: 1. Mild diffuse bilateral interstitial pulmonary opacity, which may reflect edema or atypical/viral infection. No focal airspace opacity. 2.  Emphysema. Electronically Signed   By: Delanna Ahmadi M.D.   On: 09/23/2022 19:34               LOS: 1 day   Tovah Slavick  [facility]   Pager on [email or web address]. If 7PM-7AM, please contact night-coverage at [email or web address]     09/24/2022, 11:36 AM

## 2022-09-24 NOTE — ED Notes (Signed)
RT placed pt back on Bi-pap with small nasal mask per verbal order from [name].

## 2022-09-24 NOTE — ED Notes (Signed)
RT called to beside to reevaluate pt, as this RN feels pt's WOB has increased, RR 33, pt grunting, audible wheezing noted.  RT at bedside, reevaluate pt, pt refusing Bi-PAP, pt remains on 6L [place], RT added humidified 02 for comfort. Pt has received breathing treatment at 00:08

## 2022-09-24 NOTE — ED Notes (Signed)
Pt appears to be comfortable and resting, handling the Bi-PAP well, can observe even RR, pt remains on cardiac monitoring devices, no changes noted, side rails up x2 for safety, NAD noted, plan of care ongoing, call light within reach, no further concerns as of present.

## 2022-09-24 NOTE — ED Notes (Signed)
Pt placed back onto monitor and requested a breathing treatment RN notified

## 2022-09-24 NOTE — Progress Notes (Signed)
RN called to say patient WOB has increased despite receiving recent svn.  Patient remains on 6 liters.  BBS with good aeration and faint expiratory wheezes. Patient is complaining of nausea due to potassium cocktail she is trying to drink. I placed patient on 10 liter HFNC in hopes that the flow will calm her WOB.  Patient could not tolerate bipap initially upon arrival and now she is nauseous.

## 2022-09-24 NOTE — Progress Notes (Signed)
Able to get patient to agree to bipap if use nasal mask. Pressure started low 8/4. Breathing treatment in line with bipap therapy. Patient stated therapy was helping. Provider at bedside. Will continue to monitor.

## 2022-09-24 NOTE — Progress Notes (Signed)
Patient requested to go back on bipap with nasal mask due to increased wob. Patient tolerated interventions well.

## 2022-09-24 NOTE — ED Notes (Signed)
[name] at the bedside, along with Dr. Creig Hines..the patient still refused Bi-Pap and to be intubated

## 2022-09-24 NOTE — ED Notes (Signed)
Sharion Settler, NP paged, advised pt is tripoding, c/o breathing difficult, RR 38, pt intermittent grunting. Reporting "I can't breath, I'm hyperventilating" pt placed on NRB by this RN, advised slow deep breaths, Randol Kern to come evaluate pt, verbal order for RT to draw ABG.   RT called to bedside  Pt still refusing to have a CPAP or BIPAP applied to face

## 2022-09-25 ENCOUNTER — Other Ambulatory Visit: Payer: Self-pay

## 2022-09-25 DIAGNOSIS — J441 Chronic obstructive pulmonary disease with (acute) exacerbation: Secondary | ICD-10-CM | POA: Diagnosis not present

## 2022-09-25 DIAGNOSIS — R6520 Severe sepsis without septic shock: Secondary | ICD-10-CM | POA: Diagnosis not present

## 2022-09-25 DIAGNOSIS — A419 Sepsis, unspecified organism: Secondary | ICD-10-CM | POA: Diagnosis not present

## 2022-09-25 DIAGNOSIS — J09X1 Influenza due to identified novel influenza A virus with pneumonia: Secondary | ICD-10-CM | POA: Diagnosis not present

## 2022-09-25 LAB — LACTIC ACID, PLASMA: Lactic Acid, Venous: 2.2 mmol/L (ref 0.5–1.9)

## 2022-09-25 LAB — GLUCOSE, CAPILLARY: Glucose-Capillary: 160 mg/dL — ABNORMAL HIGH (ref 70–99)

## 2022-09-25 LAB — T4, FREE: Free T4: 1 ng/dL (ref 0.61–1.12)

## 2022-09-25 LAB — CBG MONITORING, ED
Glucose-Capillary: 125 mg/dL — ABNORMAL HIGH (ref 70–99)
Glucose-Capillary: 129 mg/dL — ABNORMAL HIGH (ref 70–99)

## 2022-09-25 MED ORDER — GUAIFENESIN-DM 100-10 MG/5ML PO SYRP
5.0000 mL | ORAL_SOLUTION | ORAL | Status: DC | PRN
  Administered 2022-09-25 – 2022-09-27 (×8): 5 mL via ORAL
  Filled 2022-09-25 (×8): qty 10

## 2022-09-25 NOTE — Progress Notes (Signed)
PT Cancellation Note  Patient Details Name: Cindy Gutierrez MRN: [id number] DOB: 03/12/1961   Cancelled Treatment:    Reason Eval/Treat Not Completed: Other (comment). Chart reviewed and cleared to participate. Pt currently on 4L with sats at 98%. Pt still reports SOB symptoms with exertion. Pt reports she has been indep with mobility in room and has adult sons at home for assistance. Reports no issues with balance and zero falls. She attributes weakness to flu. Declines need for PT at this time. Will sign off.   Bilbo Carcamo 09/25/2022, 9:02 AM Greggory Stallion, PT, DPT, GCS [phone]

## 2022-09-25 NOTE — ED Notes (Signed)
Pt ambulatory without assistance to bathroom while connected to oxygen. Pt very dyspneic with exertion. Pt brushed teeth and performed personal care without assistance. Pt back to bed and medicated per MAR. Pt denies further needs at this time.   Upon entering room, pt was on 4L [place], this RN had preivously titrated [place] to 2L, unsure of who switched it back to 4L [place]. Pt remains on 4L [place] at this time due to exertional dyspnea. WCTM. VSS.

## 2022-09-25 NOTE — ED Notes (Signed)
Assumed care from [place], [place]. Pt resting comfortably in bed at this time. Call light with in reach.

## 2022-09-25 NOTE — Progress Notes (Signed)
OT Cancellation Note  Patient Details Name: Cindy Gutierrez MRN: [id number] DOB: 27-Nov-1960   Cancelled Treatment:    Reason Eval/Treat Not Completed: OT screened, no needs identified, will sign off. OT orders received, chart reviewed. Per PT, patient is at baseline. She has been completing ADLs independently in the room. No skilled OT needs indicated at this time. Please re-consult if there are any acute changes.    Doneta Public 09/25/2022, 9:38 AM

## 2022-09-25 NOTE — ED Notes (Signed)
Report given to [facility]

## 2022-09-25 NOTE — ED Notes (Signed)
Advised nurse that patient has ready bed 

## 2022-09-25 NOTE — ED Notes (Signed)
Pt titrated to 2L [place] at this time and tolerating well.

## 2022-09-25 NOTE — Progress Notes (Signed)
Progress Note    Cindy Gutierrez  FIE:[id number] DOB: 01-13-1961  DOA: 09/23/2022 [facility]      Brief Narrative:    Medical records reviewed and are as summarized below:  Cindy Gutierrez is a 61 y.o. female with medical history significant for COPD, hepatitis C, hypertension, depression, pulmonary hypertension, nontoxic multinodular goiter, hyperparathyroidism, depression, aortic regurgitation, mitral regurgitation, who presented to the hospital with shortness of breath of 2 days duration.  She was hypoxic with oxygen saturation of 80% on room air when EMS picked her up.  She was placed on nonrebreathing mask and subsequently on CPAP she was brought to the emergency department for further management.   She was found to have severe sepsis secondary to influenza A infection, COPD exacerbation complicated by acute hypoxic respiratory failure.  She was treated with BiPAP, Tamiflu and IV fluids.      Assessment/Plan:   Principal Problem:   Severe sepsis (HCC) Active Problems:   COPD exacerbation (HCC)   Influenza A with pneumonia   Acute respiratory failure with hypoxia (HCC)   COPD with acute exacerbation (HCC)   Essential hypertension   Nicotine dependence   Onychomycosis   Hyperglycemia   Lactic acidosis    Body mass index is 23.53 kg/m.  Severe sepsis secondary to influenza A infection, lactic acidosis: Continue Tamiflu.  COPD exacerbation: Continue prednisone and bronchodilators  Acute hypoxic respiratory failure: She is off of BiPAP and tolerating 4 L/min oxygen via [place].  Taper off oxygen as able.  Hypertensive urgency: BP is better.  Continue amlodipine and chlorthalidone.    Other comorbidities include hepatitis C on Glecaprevir-Pibentasvir, onychomycosis on Lamisil      Diet Order             Diet heart healthy/carb modified Room service appropriate? Yes; Fluid consistency: Thin  Diet effective now                             Consultants: None  Procedures: None    Medications:    amLODipine  10 mg Oral Daily   aspirin  81 mg Oral Daily   chlorthalidone  12.5 mg Oral Daily   enoxaparin (LOVENOX) injection  40 mg Subcutaneous Q24H   gabapentin  300 mg Oral QHS   Glecaprevir-Pibrentasvir  3 tablet Oral Daily   insulin aspart  0-15 Units Subcutaneous TID WC   insulin aspart  0-5 Units Subcutaneous QHS   ipratropium-albuterol  3 mL Nebulization Q4H   nicotine  14 mg Transdermal Daily   oseltamivir  75 mg Oral BID   pantoprazole (PROTONIX) IV  40 mg Intravenous Q24H   predniSONE  40 mg Oral Q breakfast   umeclidinium bromide  1 puff Inhalation Daily   Continuous Infusions:   Anti-infectives (From admission, onward)    Start     Dose/Rate Route Frequency Ordered Stop   09/24/22 1130  oseltamivir (TAMIFLU) capsule 75 mg        75 mg Oral 2 times daily 09/24/22 1125 09/28/22 2159   09/24/22 1000  Glecaprevir-Pibrentasvir 100-40 MG TABS 3 tablet        3 tablet Oral Daily 09/23/22 2206     09/23/22 2215  cefTRIAXone (ROCEPHIN) 1 g in sodium chloride 0.9 % 100 mL IVPB  Status:  Discontinued        1 g 200 mL/hr over 30 Minutes Intravenous Every 24 hours 09/23/22 2200 09/24/22 0519  09/23/22 2215  azithromycin (ZITHROMAX) 500 mg in sodium chloride 0.9 % 250 mL IVPB  Status:  Discontinued        500 mg 250 mL/hr over 60 Minutes Intravenous Every 24 hours 09/23/22 2200 09/24/22 0519   09/23/22 2100  oseltamivir (TAMIFLU) capsule 75 mg        75 mg Oral  Once 09/23/22 2057 09/24/22 0030              Family Communication/Anticipated D/C date and plan/Code Status   DVT prophylaxis: enoxaparin (LOVENOX) injection 40 mg Start: 09/23/22 2215 SCDs Start: 09/23/22 2207     Code Status: Full Code  Family Communication: None Disposition Plan: Plan to discharge home in 2 to 3 days   Status is: Inpatient Remains inpatient appropriate because: Hypoxic respiratory  failure       Subjective:   Interval events noted.  She is feeling better.  She still has a cough but breathing is a little better.  Objective:    Vitals:   09/25/22 0900 09/25/22 0930 09/25/22 1012 09/25/22 1200  BP: (!) 131/57   (!) 143/59  Pulse: 76 82  71  Resp: (!) 25 (!) 22  20  Temp:   99.3 F (37.4 C)   TempSrc:   Oral   SpO2: 98% 98%  100%  Weight:      Height:       No data found.  No intake or output data in the 24 hours ending 09/25/22 1206  Filed Weights   09/23/22 1919  Weight: 74.4 kg    Exam:  GEN: NAD SKIN: Warm and dry EYES: EOMI ENT: MMM CV: RRR PULM: Decreased air entry bilaterally, occasional bilateral expiratory wheezing ABD: soft, ND, NT, +BS CNS: AAO x 3, non focal EXT: No edema or tenderness       Data Reviewed:   I have personally reviewed following labs and imaging studies:  Labs: Labs show the following:   Basic Metabolic Panel: Recent Labs  Lab 09/23/22 1928 09/24/22 0012 09/24/22 0448  NA 139  --  141  K 3.3*  --  3.9  CL 102  --  106  CO2 26  --  24  GLUCOSE 249*  --  160*  BUN 12  --  11  CREATININE 0.73 0.77 0.82  CALCIUM 8.6*  --  8.0*   GFR Estimated Creatinine Clearance: 76.9 mL/min (by C-G formula based on SCr of 0.82 mg/dL). Liver Function Tests: Recent Labs  Lab 09/23/22 1928 09/24/22 0448  AST 28 31  ALT 18 18  ALKPHOS 100 93  BILITOT 0.7 0.6  PROT 7.6 7.5  ALBUMIN 3.9 3.6   No results for input(s): "LIPASE", "AMYLASE" in the last 168 hours. Recent Labs  Lab 09/23/22 1928  AMMONIA 19   Coagulation profile Recent Labs  Lab 09/24/22 0012  INR 1.1    CBC: Recent Labs  Lab 09/23/22 1928 09/24/22 0012 09/24/22 0448  WBC 10.2 9.8 10.4  NEUTROABS 9.6*  --   --   HGB 13.6 13.3 12.6  HCT 43.1 42.8 40.4  MCV 86.5 88.4 88.0  PLT 323 293 271   Cardiac Enzymes: No results for input(s): "CKTOTAL", "CKMB", "CKMBINDEX", "TROPONINI" in the last 168 hours. BNP (last 3 results) No  results for input(s): "PROBNP" in the last 8760 hours. CBG: Recent Labs  Lab 09/24/22 1148 09/24/22 1927 09/24/22 2117 09/25/22 0740 09/25/22 1128  GLUCAP 146* 138* 192* 125* 129*   D-Dimer: Recent Labs  09/24/22 0207  DDIMER 0.46   Hgb A1c: No results for input(s): "HGBA1C" in the last 72 hours. Lipid Profile: No results for input(s): "CHOL", "HDL", "LDLCALC", "TRIG", "CHOLHDL", "LDLDIRECT" in the last 72 hours. Thyroid function studies: Recent Labs    09/24/22 0447  TSH 0.080*   Anemia work up: No results for input(s): "VITAMINB12", "FOLATE", "FERRITIN", "TIBC", "IRON", "RETICCTPCT" in the last 72 hours. Sepsis Labs: Recent Labs  Lab 09/23/22 1928 09/23/22 2133 09/24/22 0012 09/24/22 0448 09/25/22 0500  PROCALCITON  --   --  <0.10  --   --   WBC 10.2  --  9.8 10.4  --   LATICACIDVEN 3.1* 4.8*  --   --  2.2*    Microbiology Recent Results (from the past 240 hour(s))  Resp panel by RT-PCR (RSV, Flu A&B, Covid) Anterior Nasal Swab     Status: Abnormal   Collection Time: 09/23/22  7:27 PM   Specimen: Anterior Nasal Swab  Result Value Ref Range Status   SARS Coronavirus 2 by RT PCR NEGATIVE NEGATIVE Final    Comment: (NOTE) SARS-CoV-2 target nucleic acids are NOT DETECTED.  The SARS-CoV-2 RNA is generally detectable in upper respiratory specimens during the acute phase of infection. The lowest concentration of SARS-CoV-2 viral copies this assay can detect is 138 copies/mL. A negative result does not preclude SARS-Cov-2 infection and should not be used as the sole basis for treatment or other patient management decisions. A negative result may occur with  improper specimen collection/handling, submission of specimen other than nasopharyngeal swab, presence of viral mutation(s) within the areas targeted by this assay, and inadequate number of viral copies(<138 copies/mL). A negative result must be combined with clinical observations, patient history, and  epidemiological information. The expected result is Negative.  Fact Sheet for Patients:  EntrepreneurPulse.com.au  Fact Sheet for Healthcare Providers:  IncredibleEmployment.be  This test is no t yet approved or cleared by the [place] FDA and  has been authorized for detection and/or diagnosis of SARS-CoV-2 by FDA under an Emergency Use Authorization (EUA). This EUA will remain  in effect (meaning this test can be used) for the duration of the COVID-19 declaration under Section 564(b)(1) of the Act, 21 U.S.C.section 360bbb-3(b)(1), unless the authorization is terminated  or revoked sooner.       Influenza A by PCR POSITIVE (A) NEGATIVE Final   Influenza B by PCR NEGATIVE NEGATIVE Final    Comment: (NOTE) The Xpert Xpress SARS-CoV-2/FLU/RSV plus assay is intended as an aid in the diagnosis of influenza from Nasopharyngeal swab specimens and should not be used as a sole basis for treatment. Nasal washings and aspirates are unacceptable for Xpert Xpress SARS-CoV-2/FLU/RSV testing.  Fact Sheet for Patients: EntrepreneurPulse.com.au  Fact Sheet for Healthcare Providers: IncredibleEmployment.be  This test is not yet approved or cleared by the [place] FDA and has been authorized for detection and/or diagnosis of SARS-CoV-2 by FDA under an Emergency Use Authorization (EUA). This EUA will remain in effect (meaning this test can be used) for the duration of the COVID-19 declaration under Section 564(b)(1) of the Act, 21 U.S.C. section 360bbb-3(b)(1), unless the authorization is terminated or revoked.     Resp Syncytial Virus by PCR NEGATIVE NEGATIVE Final    Comment: (NOTE) Fact Sheet for Patients: EntrepreneurPulse.com.au  Fact Sheet for Healthcare Providers: IncredibleEmployment.be  This test is not yet approved or cleared by the [place] FDA and has  been authorized for detection and/or diagnosis of SARS-CoV-2 by FDA under an  Emergency Use Authorization (EUA). This EUA will remain in effect (meaning this test can be used) for the duration of the COVID-19 declaration under Section 564(b)(1) of the Act, 21 U.S.C. section 360bbb-3(b)(1), unless the authorization is terminated or revoked.  Performed at [facility], [address] [place], [place] [address]     Procedures and diagnostic studies:  DG Chest [facility] 1 View  Result Date: 09/24/2022 CLINICAL DATA:  Respiratory distress EXAM: PORTABLE CHEST 1 VIEW COMPARISON:  Radiographs 09/23/2022 FINDINGS: Stable cardiomediastinal silhouette. No focal consolidation, pleural effusion, or pneumothorax. Hyperinflation and interstitial coarsening. Scarring/atelectasis left lower lung. No acute osseous abnormality. Remote left rib fractures. Surgical clips right neck. IMPRESSION: No active disease.  Emphysema. Electronically Signed   By: Placido Sou M.D.   On: 09/24/2022 01:53   DG Chest Port 1 View  Result Date: 09/23/2022 CLINICAL DATA:  Shortness of breath EXAM: PORTABLE CHEST 1 VIEW COMPARISON:  07/15/2021 FINDINGS: The heart size and mediastinal contours are within normal limits. Emphysema. Mild diffuse bilateral interstitial pulmonary opacity. The visualized skeletal structures are unremarkable. IMPRESSION: 1. Mild diffuse bilateral interstitial pulmonary opacity, which may reflect edema or atypical/viral infection. No focal airspace opacity. 2.  Emphysema. Electronically Signed   By: Delanna Ahmadi M.D.   On: 09/23/2022 19:34               LOS: 2 days   Meg Niemeier  [facility]   Pager on [email or web address]. If 7PM-7AM, please contact night-coverage at [email or web address]     09/25/2022, 12:06 PM

## 2022-09-25 NOTE — Progress Notes (Addendum)
Upon patient arrival from ED, noted increased WOB with accessory muscle used. O2 sats 98-100% on RA.  1754: PRN breathing treatment given and placed on 2L [place] temporarily for WOB by RT.

## 2022-09-26 DIAGNOSIS — J09X1 Influenza due to identified novel influenza A virus with pneumonia: Secondary | ICD-10-CM | POA: Diagnosis not present

## 2022-09-26 DIAGNOSIS — A419 Sepsis, unspecified organism: Secondary | ICD-10-CM | POA: Diagnosis not present

## 2022-09-26 DIAGNOSIS — R6520 Severe sepsis without septic shock: Secondary | ICD-10-CM | POA: Diagnosis not present

## 2022-09-26 DIAGNOSIS — J441 Chronic obstructive pulmonary disease with (acute) exacerbation: Secondary | ICD-10-CM | POA: Diagnosis not present

## 2022-09-26 LAB — GLUCOSE, CAPILLARY
Glucose-Capillary: 89 mg/dL (ref 70–99)
Glucose-Capillary: 131 mg/dL — ABNORMAL HIGH (ref 70–99)
Glucose-Capillary: 167 mg/dL — ABNORMAL HIGH (ref 70–99)
Glucose-Capillary: 162 mg/dL — ABNORMAL HIGH (ref 70–99)

## 2022-09-26 MED ORDER — IPRATROPIUM-ALBUTEROL 0.5-2.5 (3) MG/3ML IN SOLN
3.0000 mL | Freq: Four times a day (QID) | RESPIRATORY_TRACT | Status: DC
  Administered 2022-09-26 – 2022-09-28 (×8): 3 mL via RESPIRATORY_TRACT
  Filled 2022-09-26 (×9): qty 3

## 2022-09-26 MED ORDER — PANTOPRAZOLE SODIUM 40 MG PO TBEC
40.0000 mg | DELAYED_RELEASE_TABLET | Freq: Every day | ORAL | Status: DC
  Administered 2022-09-26 – 2022-09-27 (×2): 40 mg via ORAL
  Filled 2022-09-26 (×2): qty 1

## 2022-09-26 MED ORDER — ACETAMINOPHEN 325 MG PO TABS
650.0000 mg | ORAL_TABLET | Freq: Once | ORAL | Status: AC
  Administered 2022-09-26: 650 mg via ORAL
  Filled 2022-09-26: qty 2

## 2022-09-26 NOTE — Progress Notes (Signed)
Progress Note    Cindy Gutierrez  TDD:[id number] DOB: January 15, 1961  DOA: 09/23/2022 [facility]      Brief Narrative:    Medical records reviewed and are as summarized below:  Cindy Gutierrez is a 61 y.o. female with medical history significant for COPD, hepatitis C, hypertension, depression, pulmonary hypertension, nontoxic multinodular goiter, hyperparathyroidism, depression, aortic regurgitation, mitral regurgitation, who presented to the hospital with shortness of breath of 2 days duration.  She was hypoxic with oxygen saturation of 80% on room air when EMS picked her up.  She was placed on nonrebreathing mask and subsequently on CPAP she was brought to the emergency department for further management.   She was found to have severe sepsis secondary to influenza A infection, COPD exacerbation complicated by acute hypoxic respiratory failure.  She was treated with BiPAP, Tamiflu and IV fluids.      Assessment/Plan:   Principal Problem:   Severe sepsis (HCC) Active Problems:   COPD exacerbation (HCC)   Influenza A with pneumonia   Acute respiratory failure with hypoxia (HCC)   COPD with acute exacerbation (HCC)   Essential hypertension   Nicotine dependence   Onychomycosis   Hyperglycemia   Lactic acidosis    Body mass index is 23.53 kg/m.  Severe sepsis secondary to influenza A infection, lactic acidosis: Continue Tamiflu  COPD exacerbation: Continue prednisone bronchodilators.  Acute hypoxic respiratory failure: Oxygen therapy has been weaned down to 4 to 2 L/min via nasal cannula. Taper off oxygen as able.  Hypertensive urgency: BP is elevated.  Continue amlodipine and chlorthalidone  Other comorbidities include hepatitis C on Glecaprevir-Pibentasvir, onychomycosis on Lamisil      Diet Order             Diet heart healthy/carb modified Room service appropriate? Yes; Fluid consistency: Thin  Diet effective now                             Consultants: None  Procedures: None    Medications:    amLODipine  10 mg Oral Daily   aspirin  81 mg Oral Daily   chlorthalidone  12.5 mg Oral Daily   enoxaparin (LOVENOX) injection  40 mg Subcutaneous Q24H   gabapentin  300 mg Oral QHS   Glecaprevir-Pibrentasvir  3 tablet Oral Daily   insulin aspart  0-15 Units Subcutaneous TID WC   insulin aspart  0-5 Units Subcutaneous QHS   ipratropium-albuterol  3 mL Nebulization Q6H   nicotine  14 mg Transdermal Daily   oseltamivir  75 mg Oral BID   pantoprazole (PROTONIX) IV  40 mg Intravenous Q24H   predniSONE  40 mg Oral Q breakfast   Continuous Infusions:   Anti-infectives (From admission, onward)    Start     Dose/Rate Route Frequency Ordered Stop   09/24/22 1130  oseltamivir (TAMIFLU) capsule 75 mg        75 mg Oral 2 times daily 09/24/22 1125 09/28/22 2159   09/24/22 1000  Glecaprevir-Pibrentasvir 100-40 MG TABS 3 tablet        3 tablet Oral Daily 09/23/22 2206     09/23/22 2215  cefTRIAXone (ROCEPHIN) 1 g in sodium chloride 0.9 % 100 mL IVPB  Status:  Discontinued        1 g 200 mL/hr over 30 Minutes Intravenous Every 24 hours 09/23/22 2200 09/24/22 0519   09/23/22 2215  azithromycin (ZITHROMAX) 500 mg in sodium  chloride 0.9 % 250 mL IVPB  Status:  Discontinued        500 mg 250 mL/hr over 60 Minutes Intravenous Every 24 hours 09/23/22 2200 09/24/22 0519   09/23/22 2100  oseltamivir (TAMIFLU) capsule 75 mg        75 mg Oral  Once 09/23/22 2057 09/24/22 0030              Family Communication/Anticipated D/C date and plan/Code Status   DVT prophylaxis: enoxaparin (LOVENOX) injection 40 mg Start: 09/23/22 2215 SCDs Start: 09/23/22 2207     Code Status: Full Code  Family Communication: None Disposition Plan: Plan to discharge home tomorrow   Status is: Inpatient Remains inpatient appropriate because: Hypoxic respiratory failure       Subjective:   Interval events noted.  She  complains of cough and congestion.  She still feels short of breath with minimal exertion.  Objective:    Vitals:   09/25/22 2106 09/26/22 0352 09/26/22 0813 09/26/22 0826  BP:  (!) 141/64  (!) 170/62  Pulse:  71  72  Resp:  19  16  Temp:  98 F (36.7 C)  98.4 F (36.9 C)  TempSrc:  Oral  Oral  SpO2: 95% 98% 95% 97%  Weight:      Height:       No data found.   Intake/Output Summary (Last 24 hours) at 09/26/2022 1200 Last data filed at 09/26/2022 1000 Gross per 24 hour  Intake 640 ml  Output 120 ml  Net 520 ml    Filed Weights   09/23/22 1919  Weight: 74.4 kg    Exam:  GEN: NAD SKIN: Warm and dry EYES: No pallor or icterus ENT: MMM CV: RRR PULM: Decreased air entry bilaterally, mild bilateral wheezing ABD: soft, nondistended, NT, +BS CNS: AAO x 3, non focal EXT: No edema or tenderness        Data Reviewed:   I have personally reviewed following labs and imaging studies:  Labs: Labs show the following:   Basic Metabolic Panel: Recent Labs  Lab 09/23/22 1928 09/24/22 0012 09/24/22 0448  NA 139  --  141  K 3.3*  --  3.9  CL 102  --  106  CO2 26  --  24  GLUCOSE 249*  --  160*  BUN 12  --  11  CREATININE 0.73 0.77 0.82  CALCIUM 8.6*  --  8.0*   GFR Estimated Creatinine Clearance: 76.9 mL/min (by C-G formula based on SCr of 0.82 mg/dL). Liver Function Tests: Recent Labs  Lab 09/23/22 1928 09/24/22 0448  AST 28 31  ALT 18 18  ALKPHOS 100 93  BILITOT 0.7 0.6  PROT 7.6 7.5  ALBUMIN 3.9 3.6   No results for input(s): "LIPASE", "AMYLASE" in the last 168 hours. Recent Labs  Lab 09/23/22 1928  AMMONIA 19   Coagulation profile Recent Labs  Lab 09/24/22 0012  INR 1.1    CBC: Recent Labs  Lab 09/23/22 1928 09/24/22 0012 09/24/22 0448  WBC 10.2 9.8 10.4  NEUTROABS 9.6*  --   --   HGB 13.6 13.3 12.6  HCT 43.1 42.8 40.4  MCV 86.5 88.4 88.0  PLT 323 293 271   Cardiac Enzymes: No results for input(s): "CKTOTAL", "CKMB",  "CKMBINDEX", "TROPONINI" in the last 168 hours. BNP (last 3 results) No results for input(s): "PROBNP" in the last 8760 hours. CBG: Recent Labs  Lab 09/24/22 2117 09/25/22 0740 09/25/22 1128 09/25/22 2008 09/26/22 0826  GLUCAP  192* 125* 129* 160* 89   D-Dimer: Recent Labs    09/24/22 0207  DDIMER 0.46   Hgb A1c: No results for input(s): "HGBA1C" in the last 72 hours. Lipid Profile: No results for input(s): "CHOL", "HDL", "LDLCALC", "TRIG", "CHOLHDL", "LDLDIRECT" in the last 72 hours. Thyroid function studies: Recent Labs    09/24/22 0447  TSH 0.080*   Anemia work up: No results for input(s): "VITAMINB12", "FOLATE", "FERRITIN", "TIBC", "IRON", "RETICCTPCT" in the last 72 hours. Sepsis Labs: Recent Labs  Lab 09/23/22 1928 09/23/22 2133 09/24/22 0012 09/24/22 0448 09/25/22 0500  PROCALCITON  --   --  <0.10  --   --   WBC 10.2  --  9.8 10.4  --   LATICACIDVEN 3.1* 4.8*  --   --  2.2*    Microbiology Recent Results (from the past 240 hour(s))  Resp panel by RT-PCR (RSV, Flu A&B, Covid) Anterior Nasal Swab     Status: Abnormal   Collection Time: 09/23/22  7:27 PM   Specimen: Anterior Nasal Swab  Result Value Ref Range Status   SARS Coronavirus 2 by RT PCR NEGATIVE NEGATIVE Final    Comment: (NOTE) SARS-CoV-2 target nucleic acids are NOT DETECTED.  The SARS-CoV-2 RNA is generally detectable in upper respiratory specimens during the acute phase of infection. The lowest concentration of SARS-CoV-2 viral copies this assay can detect is 138 copies/mL. A negative result does not preclude SARS-Cov-2 infection and should not be used as the sole basis for treatment or other patient management decisions. A negative result may occur with  improper specimen collection/handling, submission of specimen other than nasopharyngeal swab, presence of viral mutation(s) within the areas targeted by this assay, and inadequate number of viral copies(<138 copies/mL). A negative  result must be combined with clinical observations, patient history, and epidemiological information. The expected result is Negative.  Fact Sheet for Patients:  EntrepreneurPulse.com.au  Fact Sheet for Healthcare Providers:  IncredibleEmployment.be  This test is no t yet approved or cleared by the [place] FDA and  has been authorized for detection and/or diagnosis of SARS-CoV-2 by FDA under an Emergency Use Authorization (EUA). This EUA will remain  in effect (meaning this test can be used) for the duration of the COVID-19 declaration under Section 564(b)(1) of the Act, 21 U.S.C.section 360bbb-3(b)(1), unless the authorization is terminated  or revoked sooner.       Influenza A by PCR POSITIVE (A) NEGATIVE Final   Influenza B by PCR NEGATIVE NEGATIVE Final    Comment: (NOTE) The Xpert Xpress SARS-CoV-2/FLU/RSV plus assay is intended as an aid in the diagnosis of influenza from Nasopharyngeal swab specimens and should not be used as a sole basis for treatment. Nasal washings and aspirates are unacceptable for Xpert Xpress SARS-CoV-2/FLU/RSV testing.  Fact Sheet for Patients: EntrepreneurPulse.com.au  Fact Sheet for Healthcare Providers: IncredibleEmployment.be  This test is not yet approved or cleared by the [place] FDA and has been authorized for detection and/or diagnosis of SARS-CoV-2 by FDA under an Emergency Use Authorization (EUA). This EUA will remain in effect (meaning this test can be used) for the duration of the COVID-19 declaration under Section 564(b)(1) of the Act, 21 U.S.C. section 360bbb-3(b)(1), unless the authorization is terminated or revoked.     Resp Syncytial Virus by PCR NEGATIVE NEGATIVE Final    Comment: (NOTE) Fact Sheet for Patients: EntrepreneurPulse.com.au  Fact Sheet for Healthcare  Providers: IncredibleEmployment.be  This test is not yet approved or cleared by the [place] and  has been authorized for detection and/or diagnosis of SARS-CoV-2 by FDA under an Emergency Use Authorization (EUA). This EUA will remain in effect (meaning this test can be used) for the duration of the COVID-19 declaration under Section 564(b)(1) of the Act, 21 U.S.C. section 360bbb-3(b)(1), unless the authorization is terminated or revoked.  Performed at [facility], [address] [place], [place] [address]     Procedures and diagnostic studies:  No results found.             LOS: 3 days   Danyal Whitenack  [facility]   Pager on [email or web address]. If 7PM-7AM, please contact night-coverage at [email or web address]     09/26/2022, 12:00 PM

## 2022-09-27 ENCOUNTER — Ambulatory Visit: Payer: No Typology Code available for payment source

## 2022-09-27 DIAGNOSIS — R6520 Severe sepsis without septic shock: Secondary | ICD-10-CM | POA: Diagnosis not present

## 2022-09-27 DIAGNOSIS — J441 Chronic obstructive pulmonary disease with (acute) exacerbation: Secondary | ICD-10-CM | POA: Diagnosis not present

## 2022-09-27 DIAGNOSIS — J09X1 Influenza due to identified novel influenza A virus with pneumonia: Secondary | ICD-10-CM | POA: Diagnosis not present

## 2022-09-27 DIAGNOSIS — A419 Sepsis, unspecified organism: Secondary | ICD-10-CM | POA: Diagnosis not present

## 2022-09-27 LAB — CBC
HCT: 38.3 % (ref 36.0–46.0)
Hemoglobin: 12.5 g/dL (ref 12.0–15.0)
MCH: 28 pg (ref 26.0–34.0)
MCHC: 32.6 g/dL (ref 30.0–36.0)
MCV: 85.7 fL (ref 80.0–100.0)
Platelets: 276 10*3/uL (ref 150–400)
RBC: 4.47 MIL/uL (ref 3.87–5.11)
RDW: 13.8 % (ref 11.5–15.5)
WBC: 10.7 10*3/uL — ABNORMAL HIGH (ref 4.0–10.5)
nRBC: 0 % (ref 0.0–0.2)

## 2022-09-27 LAB — GLUCOSE, CAPILLARY
Glucose-Capillary: 168 mg/dL — ABNORMAL HIGH (ref 70–99)
Glucose-Capillary: 145 mg/dL — ABNORMAL HIGH (ref 70–99)
Glucose-Capillary: 138 mg/dL — ABNORMAL HIGH (ref 70–99)
Glucose-Capillary: 138 mg/dL — ABNORMAL HIGH (ref 70–99)

## 2022-09-27 LAB — HEMOGLOBIN A1C
Hgb A1c MFr Bld: 6.1 % — ABNORMAL HIGH (ref 4.8–5.6)
Mean Plasma Glucose: 128 mg/dL

## 2022-09-27 MED ORDER — ACETYLCYSTEINE 20 % IN SOLN
2.0000 mL | Freq: Three times a day (TID) | RESPIRATORY_TRACT | Status: DC
  Administered 2022-09-27 (×3): 2 mL via RESPIRATORY_TRACT
  Filled 2022-09-27 (×5): qty 4

## 2022-09-27 MED ORDER — FLUTICASONE FUROATE-VILANTEROL 200-25 MCG/ACT IN AEPB
1.0000 | INHALATION_SPRAY | Freq: Every day | RESPIRATORY_TRACT | Status: DC
  Administered 2022-09-27 – 2022-09-28 (×2): 1 via RESPIRATORY_TRACT
  Filled 2022-09-27: qty 28

## 2022-09-27 MED ORDER — LORAZEPAM 1 MG PO TABS
1.0000 mg | ORAL_TABLET | Freq: Once | ORAL | Status: AC
  Administered 2022-09-27: 1 mg via ORAL
  Filled 2022-09-27: qty 1

## 2022-09-27 MED ORDER — BOOST / RESOURCE BREEZE PO LIQD CUSTOM
1.0000 | Freq: Three times a day (TID) | ORAL | Status: DC
  Administered 2022-09-27 – 2022-09-28 (×2): 1 via ORAL

## 2022-09-27 MED ORDER — ADULT MULTIVITAMIN W/MINERALS CH
1.0000 | ORAL_TABLET | Freq: Every day | ORAL | Status: DC
  Administered 2022-09-28: 1 via ORAL
  Filled 2022-09-27: qty 1

## 2022-09-27 MED ORDER — ACETAMINOPHEN 325 MG PO TABS
650.0000 mg | ORAL_TABLET | Freq: Four times a day (QID) | ORAL | Status: DC | PRN
  Administered 2022-09-27 – 2022-09-28 (×3): 650 mg via ORAL
  Filled 2022-09-27 (×3): qty 2

## 2022-09-27 NOTE — Progress Notes (Signed)
Progress Note    Cindy Gutierrez  IWL:[id number] DOB: [birth year]  DOA: 09/23/2022 [facility]      Brief Narrative:    Medical records reviewed and are as summarized below:  Cindy Gutierrez is a 61 y.o. female with medical history significant for COPD, hepatitis C, hypertension, depression, pulmonary hypertension, nontoxic multinodular goiter, hyperparathyroidism, depression, aortic regurgitation, mitral regurgitation, who presented to the hospital with shortness of breath of 2 days duration.  She was hypoxic with oxygen saturation of 80% on room air when EMS picked her up.  She was placed on nonrebreathing mask and subsequently on CPAP she was brought to the emergency department for further management.   She was found to have severe sepsis secondary to influenza A infection, COPD exacerbation complicated by acute hypoxic respiratory failure.  She was treated with BiPAP, Tamiflu and IV fluids.      Assessment/Plan:   Principal Problem:   Severe sepsis (HCC) Active Problems:   COPD exacerbation (HCC)   Influenza A with pneumonia   Acute respiratory failure with hypoxia (HCC)   COPD with acute exacerbation (HCC)   Essential hypertension   Nicotine dependence   Onychomycosis   Hyperglycemia   Lactic acidosis    Body mass index is 23.53 kg/m.  Severe sepsis secondary to influenza A infection, lactic acidosis: Continue Tamiflu and Robitussin cough syrup  COPD exacerbation: Continue prednisone and DuoNeb via nebulizer.  Mucomyst via nebulizer added because of slow improvement.  Acute hypoxic respiratory failure: Oxygen therapy escalated from the 2 to 3 L/min oxygen via nasal cannula.  Taper off oxygen as able.  Hypertensive urgency: BP is elevated.  Continue amlodipine and chlorthalidone  Anxiety/panic attacks: Ativan 1 mg p.o. x 1 dose ordered  Other comorbidities include hepatitis C on Glecaprevir-Pibentasvir, onychomycosis on  Lamisil      Diet Order             Diet heart healthy/carb modified Room service appropriate? Yes; Fluid consistency: Thin  Diet effective now                            Consultants: None  Procedures: None    Medications:    acetylcysteine  2 mL Nebulization TID   amLODipine  10 mg Oral Daily   aspirin  81 mg Oral Daily   chlorthalidone  12.5 mg Oral Daily   enoxaparin (LOVENOX) injection  40 mg Subcutaneous Q24H   gabapentin  300 mg Oral QHS   Glecaprevir-Pibrentasvir  3 tablet Oral Daily   insulin aspart  0-15 Units Subcutaneous TID WC   insulin aspart  0-5 Units Subcutaneous QHS   ipratropium-albuterol  3 mL Nebulization Q6H   nicotine  14 mg Transdermal Daily   oseltamivir  75 mg Oral BID   pantoprazole  40 mg Oral Daily   predniSONE  40 mg Oral Q breakfast   Continuous Infusions:   Anti-infectives (From admission, onward)    Start     Dose/Rate Route Frequency Ordered Stop   09/24/22 1130  oseltamivir (TAMIFLU) capsule 75 mg        75 mg Oral 2 times daily 09/24/22 1125 09/28/22 2159   09/24/22 1000  Glecaprevir-Pibrentasvir 100-40 MG TABS 3 tablet        3 tablet Oral Daily 09/23/22 2206     09/23/22 2215  cefTRIAXone (ROCEPHIN) 1 g in sodium chloride 0.9 % 100 mL IVPB  Status:  Discontinued  1 g 200 mL/hr over 30 Minutes Intravenous Every 24 hours 09/23/22 2200 09/24/22 0519   09/23/22 2215  azithromycin (ZITHROMAX) 500 mg in sodium chloride 0.9 % 250 mL IVPB  Status:  Discontinued        500 mg 250 mL/hr over 60 Minutes Intravenous Every 24 hours 09/23/22 2200 09/24/22 0519   09/23/22 2100  oseltamivir (TAMIFLU) capsule 75 mg        75 mg Oral  Once 09/23/22 2057 09/24/22 0030              Family Communication/Anticipated D/C date and plan/Code Status   DVT prophylaxis: enoxaparin (LOVENOX) injection 40 mg Start: 09/23/22 2215 SCDs Start: 09/23/22 2207     Code Status: Full Code  Family Communication:  None Disposition Plan: Plan to discharge home tomorrow   Status is: Inpatient Remains inpatient appropriate because: Hypoxic respiratory failure       Subjective:   She complains of cough, shortness of breath and congestion.  Objective:    Vitals:   09/26/22 1828 09/26/22 2219 09/27/22 0402 09/27/22 0853  BP: (!) 140/98 (!) 154/60 (!) 147/61   Pulse: 80 77 74 74  Resp: 16   18  Temp: 98.7 F (37.1 C) 98 F (36.7 C) 98.2 F (36.8 C)   TempSrc: Oral Oral Oral   SpO2: 98% 98% 96% 94%  Weight:      Height:       No data found.  No intake or output data in the 24 hours ending 09/27/22 1300   Filed Weights   09/23/22 1919  Weight: 74.4 kg    Exam:  GEN: NAD SKIN: Warm and dry EYES: EOMI ENT: MMM CV: RRR PULM: Bilateral wheezing, decreased air entry bilaterally ABD: soft, ND, NT, +BS CNS: AAO x 3, non focal EXT: No edema or tenderness       Data Reviewed:   I have personally reviewed following labs and imaging studies:  Labs: Labs show the following:   Basic Metabolic Panel: Recent Labs  Lab 09/23/22 1928 09/24/22 0012 09/24/22 0448  NA 139  --  141  K 3.3*  --  3.9  CL 102  --  106  CO2 26  --  24  GLUCOSE 249*  --  160*  BUN 12  --  11  CREATININE 0.73 0.77 0.82  CALCIUM 8.6*  --  8.0*   GFR Estimated Creatinine Clearance: 76.9 mL/min (by C-G formula based on SCr of 0.82 mg/dL). Liver Function Tests: Recent Labs  Lab 09/23/22 1928 09/24/22 0448  AST 28 31  ALT 18 18  ALKPHOS 100 93  BILITOT 0.7 0.6  PROT 7.6 7.5  ALBUMIN 3.9 3.6   No results for input(s): "LIPASE", "AMYLASE" in the last 168 hours. Recent Labs  Lab 09/23/22 1928  AMMONIA 19   Coagulation profile Recent Labs  Lab 09/24/22 0012  INR 1.1    CBC: Recent Labs  Lab 09/23/22 1928 09/24/22 0012 09/24/22 0448 09/27/22 0617  WBC 10.2 9.8 10.4 10.7*  NEUTROABS 9.6*  --   --   --   HGB 13.6 13.3 12.6 12.5  HCT 43.1 42.8 40.4 38.3  MCV 86.5 88.4 88.0  85.7  PLT 323 293 271 276   Cardiac Enzymes: No results for input(s): "CKTOTAL", "CKMB", "CKMBINDEX", "TROPONINI" in the last 168 hours. BNP (last 3 results) No results for input(s): "PROBNP" in the last 8760 hours. CBG: Recent Labs  Lab 09/26/22 0826 09/26/22 1226 09/26/22 1828 09/26/22 2112  09/27/22 0851  GLUCAP 89 131* 167* 162* 145*   D-Dimer: No results for input(s): "DDIMER" in the last 72 hours.  Hgb A1c: No results for input(s): "HGBA1C" in the last 72 hours. Lipid Profile: No results for input(s): "CHOL", "HDL", "LDLCALC", "TRIG", "CHOLHDL", "LDLDIRECT" in the last 72 hours. Thyroid function studies: No results for input(s): "TSH", "T4TOTAL", "T3FREE", "THYROIDAB" in the last 72 hours.  Invalid input(s): "FREET3"  Anemia work up: No results for input(s): "VITAMINB12", "FOLATE", "FERRITIN", "TIBC", "IRON", "RETICCTPCT" in the last 72 hours. Sepsis Labs: Recent Labs  Lab 09/23/22 1928 09/23/22 2133 09/24/22 0012 09/24/22 0448 09/25/22 0500 09/27/22 0617  PROCALCITON  --   --  <0.10  --   --   --   WBC 10.2  --  9.8 10.4  --  10.7*  LATICACIDVEN 3.1* 4.8*  --   --  2.2*  --     Microbiology Recent Results (from the past 240 hour(s))  Resp panel by RT-PCR (RSV, Flu A&B, Covid) Anterior Nasal Swab     Status: Abnormal   Collection Time: 09/23/22  7:27 PM   Specimen: Anterior Nasal Swab  Result Value Ref Range Status   SARS Coronavirus 2 by RT PCR NEGATIVE NEGATIVE Final    Comment: (NOTE) SARS-CoV-2 target nucleic acids are NOT DETECTED.  The SARS-CoV-2 RNA is generally detectable in upper respiratory specimens during the acute phase of infection. The lowest concentration of SARS-CoV-2 viral copies this assay can detect is 138 copies/mL. A negative result does not preclude SARS-Cov-2 infection and should not be used as the sole basis for treatment or other patient management decisions. A negative result may occur with  improper specimen  collection/handling, submission of specimen other than nasopharyngeal swab, presence of viral mutation(s) within the areas targeted by this assay, and inadequate number of viral copies(<138 copies/mL). A negative result must be combined with clinical observations, patient history, and epidemiological information. The expected result is Negative.  Fact Sheet for Patients:  EntrepreneurPulse.com.au  Fact Sheet for Healthcare Providers:  IncredibleEmployment.be  This test is no t yet approved or cleared by the [place] FDA and  has been authorized for detection and/or diagnosis of SARS-CoV-2 by FDA under an Emergency Use Authorization (EUA). This EUA will remain  in effect (meaning this test can be used) for the duration of the COVID-19 declaration under Section 564(b)(1) of the Act, 21 U.S.C.section 360bbb-3(b)(1), unless the authorization is terminated  or revoked sooner.       Influenza A by PCR POSITIVE (A) NEGATIVE Final   Influenza B by PCR NEGATIVE NEGATIVE Final    Comment: (NOTE) The Xpert Xpress SARS-CoV-2/FLU/RSV plus assay is intended as an aid in the diagnosis of influenza from Nasopharyngeal swab specimens and should not be used as a sole basis for treatment. Nasal washings and aspirates are unacceptable for Xpert Xpress SARS-CoV-2/FLU/RSV testing.  Fact Sheet for Patients: EntrepreneurPulse.com.au  Fact Sheet for Healthcare Providers: IncredibleEmployment.be  This test is not yet approved or cleared by the [place] FDA and has been authorized for detection and/or diagnosis of SARS-CoV-2 by FDA under an Emergency Use Authorization (EUA). This EUA will remain in effect (meaning this test can be used) for the duration of the COVID-19 declaration under Section 564(b)(1) of the Act, 21 U.S.C. section 360bbb-3(b)(1), unless the authorization is terminated or revoked.     Resp Syncytial  Virus by PCR NEGATIVE NEGATIVE Final    Comment: (NOTE) Fact Sheet for Patients: EntrepreneurPulse.com.au  Fact Sheet for  Healthcare Providers: IncredibleEmployment.be  This test is not yet approved or cleared by the [place] and has been authorized for detection and/or diagnosis of SARS-CoV-2 by FDA under an Emergency Use Authorization (EUA). This EUA will remain in effect (meaning this test can be used) for the duration of the COVID-19 declaration under Section 564(b)(1) of the Act, 21 U.S.C. section 360bbb-3(b)(1), unless the authorization is terminated or revoked.  Performed at [facility], [address] [place], [place] [address]     Procedures and diagnostic studies:  No results found.             LOS: 4 days   Draper Gallon  [facility]   Pager on [email or web address]. If 7PM-7AM, please contact night-coverage at [email or web address]     09/27/2022, 1:00 PM

## 2022-09-27 NOTE — Progress Notes (Signed)
Initial Nutrition Assessment  DOCUMENTATION CODES:   Not applicable  INTERVENTION:   Boost Breeze po TID, each supplement provides 250 kcal and 9 grams of protein  MVI po daily   Liberalize diet   Pt at high refeed risk; recommend monitor potassium, magnesium and phosphorus labs daily until stable  NUTRITION DIAGNOSIS:   Inadequate oral intake related to acute illness as evidenced by per patient/family report.  GOAL:   Patient will meet greater than or equal to 90% of their needs  MONITOR:   PO intake, Supplement acceptance, Labs, Weight trends, I & O's, Skin  REASON FOR ASSESSMENT:   Consult Assessment of nutrition requirement/status  ASSESSMENT:   61 y/o female with h/o COPD, Hep C, HTN, depression, pulmonary HTN, goiter and hyperthyroid s/p parathyroidectomy who is admitted with COPD exacerbation secondary to Flu A.  Met with pt in room today. Pt reports good appetite and oral intake at baseline but reports decreased oral intake pta r/t acute illness. Pt reports that she is eating fair in the hospital but states that she is unable to order foods that she likes r/t her diet restrictions. RD discussed with pt the importance of adequate nutritional intake to preserve lean muscle. Pt is willing to try Boost Breeze but reports that she does not like milky supplements. RD will also liberalize pt's diet. Pt is at refeed risk. Per chart, pt appears weight stable at baseline.   Medications reviewed and include: aspirin, lovenox, insulin, nicotine, protonix, prednisone  Labs reviewed: K 3.9 wnl Wbc- 10.7(H) Cbgs- 138, 145 x 24 hrs AIC 6.1(H)- 12/23  NUTRITION - FOCUSED PHYSICAL EXAM:  Flowsheet Row Most Recent Value  Orbital Region No depletion  Upper Arm Region No depletion  Thoracic and Lumbar Region No depletion  Buccal Region No depletion  Temple Region No depletion  Clavicle Bone Region No depletion  Clavicle and Acromion Bone Region No depletion  Scapular Bone  Region No depletion  Dorsal Hand No depletion  Patellar Region No depletion  Anterior Thigh Region No depletion  Posterior Calf Region No depletion  Edema (RD Assessment) None  Hair Reviewed  Eyes Reviewed  Mouth Reviewed  Skin Reviewed  Nails Reviewed   Diet Order:   Diet Order             Diet heart healthy/carb modified Room service appropriate? Yes; Fluid consistency: Thin  Diet effective now                  EDUCATION NEEDS:   Education needs have been addressed  Skin:  Skin Assessment: Reviewed RN Assessment  Last BM:  12/21  Height:   Ht Readings from Last 1 Encounters:  09/23/22 5' 10" (1.778 m)    Weight:   Wt Readings from Last 1 Encounters:  09/23/22 74.4 kg    Ideal Body Weight:  68 kg  BMI:  Body mass index is 23.53 kg/m.  Estimated Nutritional Needs:   Kcal:  1900-2200kcal/day  Protein:  90-105g/day  Fluid:  1.9-2.2L/day  Casey Campbell MS, RD, LDN Please refer to AMION for RD and/or RD on-call/weekend/after hours pager  

## 2022-09-27 NOTE — Progress Notes (Signed)
Pt preferred to wait til am to do cpt

## 2022-09-28 ENCOUNTER — Encounter: Payer: Self-pay | Admitting: *Deleted

## 2022-09-28 DIAGNOSIS — J449 Chronic obstructive pulmonary disease, unspecified: Secondary | ICD-10-CM

## 2022-09-28 DIAGNOSIS — J9621 Acute and chronic respiratory failure with hypoxia: Secondary | ICD-10-CM | POA: Diagnosis not present

## 2022-09-28 DIAGNOSIS — A419 Sepsis, unspecified organism: Secondary | ICD-10-CM | POA: Diagnosis not present

## 2022-09-28 DIAGNOSIS — J441 Chronic obstructive pulmonary disease with (acute) exacerbation: Secondary | ICD-10-CM | POA: Diagnosis not present

## 2022-09-28 DIAGNOSIS — J09X1 Influenza due to identified novel influenza A virus with pneumonia: Secondary | ICD-10-CM | POA: Diagnosis not present

## 2022-09-28 DIAGNOSIS — R0600 Dyspnea, unspecified: Secondary | ICD-10-CM

## 2022-09-28 LAB — GLUCOSE, CAPILLARY
Glucose-Capillary: 88 mg/dL (ref 70–99)
Glucose-Capillary: 196 mg/dL — ABNORMAL HIGH (ref 70–99)
Glucose-Capillary: 193 mg/dL — ABNORMAL HIGH (ref 70–99)

## 2022-09-28 MED ORDER — ACETYLCYSTEINE 20 % IN SOLN
2.0000 mL | Freq: Three times a day (TID) | RESPIRATORY_TRACT | Status: DC
  Administered 2022-09-28 (×2): 2 mL via RESPIRATORY_TRACT
  Filled 2022-09-28 (×3): qty 4

## 2022-09-28 MED ORDER — GABAPENTIN 300 MG PO CAPS
300.0000 mg | ORAL_CAPSULE | Freq: Two times a day (BID) | ORAL | Status: DC
  Administered 2022-09-28: 300 mg via ORAL
  Filled 2022-09-28: qty 1

## 2022-09-28 MED ORDER — LORAZEPAM 0.5 MG PO TABS
0.5000 mg | ORAL_TABLET | Freq: Once | ORAL | Status: AC
  Administered 2022-09-28: 0.5 mg via ORAL
  Filled 2022-09-28: qty 1

## 2022-09-28 NOTE — TOC Progression Note (Signed)
Transition of Care [facility]) - Progression Note    Patient Details  Name: Cindy Gutierrez MRN: [id number] Date of Birth: [birth year]  Transition of [facility] CM/SW Contact  Gerilyn Pilgrim, LCSW Phone Number: 09/28/2022, 2:48 PM  Clinical Narrative:   Pt qualified for home O2. Saturations note complete. Oxygen ordered through [facility]. DC orders are in for patient.          Expected Discharge Plan and Services         Expected Discharge Date: 09/28/22                                     Social Determinants of Health (SDOH) Interventions SDOH Screenings   Food Insecurity: No Food Insecurity (09/25/2022)  Housing: Low Risk  (09/25/2022)  Transportation Needs: No Transportation Needs (09/25/2022)  Utilities: Not At Risk (09/25/2022)  Depression (PHQ2-9): High Risk (09/08/2022)  Tobacco Use: High Risk (09/24/2022)    Readmission Risk Interventions     No data to display

## 2022-09-28 NOTE — Discharge Summary (Signed)
Physician Discharge Summary   Patient: Cindy Gutierrez MRN: [id number] DOB: June 11, 1961  Admit date:     09/23/2022  Discharge date: 09/28/22  Discharge Physician: Jennye Boroughs   [facility]   Recommendations at discharge:  {Tip this will not be part of the note when signed- Example include specific recommendations for outpatient follow-up, pending tests to follow-up on. (Optional):26781} Follow-up with PCP in 1 to 2 weeks  Discharge Diagnoses: Principal Problem:   Severe sepsis ([place]) Active Problems:   COPD exacerbation ([place])   Influenza A with pneumonia   Acute on chronic respiratory failure with hypoxia (HCC)   COPD with acute exacerbation (HCC)   Essential hypertension   Nicotine dependence   Onychomycosis   Hyperglycemia   Lactic acidosis  Resolved Problems:   * No resolved hospital problems. [facility] Course:  Cindy Gutierrez is a 61 y.o. female with medical history significant for COPD, hepatitis C, hypertension, depression, pulmonary hypertension, nontoxic multinodular goiter, hyperparathyroidism, depression, aortic regurgitation, mitral regurgitation, who presented to the hospital with shortness of breath of 2 days duration.  She was hypoxic with oxygen saturation of 80% on room air when EMS picked her up.  She was placed on nonrebreathing mask and subsequently on CPAP she was brought to the emergency department for further management.     She was found to have severe sepsis secondary to influenza A infection, COPD exacerbation complicated by acute hypoxic respiratory failure.  She was treated with BiPAP, Tamiflu and IV fluids.    4 L oxygen at discharge***  Assessment and Plan:  Severe sepsis secondary to influenza A infection, lactic acidosis: Resolved.  Completed Tamiflu.  Use Robitussin as needed for cough.   COPD exacerbation: Continue prednisone and DuoNeb via nebulizer.  Mucomyst via nebulizer added because of slow improvement.    Acute hypoxic respiratory failure: Oxygen therapy escalated from the 2 to 3 L/min oxygen via nasal cannula.  Taper off oxygen as able.   Hypertensive urgency: BP is elevated.  Continue amlodipine and chlorthalidone   Anxiety/panic attacks: Ativan 1 mg p.o. x 1 dose ordered   Other comorbidities include hepatitis C on Glecaprevir-Pibentasvir, onychomycosis on Lamisil          {Tip this will not be part of the note when signed Body mass index is 23.53 kg/m. ,  Nutrition Documentation    [place] ED to Hosp-Admission (Current) from 09/23/2022 in [place] [facility] PCU  Nutrition Problem Inadequate oral intake  Etiology acute illness  Nutrition Goal Patient will meet greater than or equal to 90% of their needs     ,  (Optional):26781}   Consultants: None Procedures performed: None  Disposition: Home Diet recommendation:  Discharge Diet Orders (From admission, onward)     Start     Ordered   09/28/22 0000  Diet - low sodium heart healthy        09/28/22 1344           Cardiac diet DISCHARGE MEDICATION: Allergies as of 09/28/2022       Reactions   Sulfa Antibiotics Hives, Rash   Bee Pollen Hives   Lisinopril Nausea Only   Losartan    Penicillins Hives   Tiotropium Bromide Monohydrate    Other reaction(s): Cough   Carvedilol         Medication List     STOP taking these medications    Breztri Aerosphere 160-9-4.8 MCG/ACT Aero Generic drug: Budeson-Glycopyrrol-Formoterol   calcium citrate 950 (  200 Ca) MG tablet Commonly known as: CALCITRATE - dosed in mg elemental calcium   Fluticasone-Salmeterol 250-50 MCG/DOSE Aepb Commonly known as: ADVAIR   FUROSEMIDE PO   Incruse Ellipta 62.5 MCG/ACT Aepb Generic drug: umeclidinium bromide   levofloxacin 500 MG tablet Commonly known as: LEVAQUIN   methylPREDNISolone 4 MG Tbpk tablet Commonly known as: MEDROL DOSEPAK   predniSONE 10 MG tablet Commonly known as: DELTASONE   SPIRIVA  RESPIMAT IN       TAKE these medications    albuterol (2.5 MG/3ML) 0.083% nebulizer solution Commonly known as: PROVENTIL Take 2.5 mg by nebulization every 6 (six) hours as needed for wheezing or shortness of breath.   albuterol 108 (90 Base) MCG/ACT inhaler Commonly known as: VENTOLIN HFA Inhale 1-2 puffs into the lungs every 6 (six) hours as needed for wheezing or shortness of breath.   amLODipine 10 MG tablet Commonly known as: NORVASC Take 10 mg by mouth daily.   aspirin 81 MG chewable tablet Chew 81 mg by mouth daily.   budesonide-formoterol 160-4.5 MCG/ACT inhaler Commonly known as: SYMBICORT Inhale 2 puffs into the lungs 2 (two) times daily.   calcium carbonate 500 MG chewable tablet Commonly known as: TUMS - dosed in mg elemental calcium Chew 1 tablet by mouth daily. As needed   chlorthalidone 25 MG tablet Commonly known as: HYGROTON Take 12.5 mg by mouth daily.   Cholecalciferol 25 MCG (1000 UT) tablet Take 3,000 Units by mouth daily.   diclofenac Sodium 1 % Gel Commonly known as: VOLTAREN Apply 2 g topically 4 (four) times daily.   gabapentin 300 MG capsule Commonly known as: NEURONTIN Take 300 mg by mouth at bedtime.   guaiFENesin 100 MG/5ML liquid Commonly known as: ROBITUSSIN Take 5 mLs by mouth every 4 (four) hours as needed for cough or to loosen phlegm. What changed: Another medication with the same name was removed. Continue taking this medication, and follow the directions you see here.   Mavyret 100-40 MG Tabs Generic drug: Glecaprevir-Pibrentasvir Take 3 tablets by mouth daily.   nicotine 14 mg/24hr patch Commonly known as: NICODERM CQ - dosed in mg/24 hours Place 14 mg onto the skin daily.   OVER THE COUNTER MEDICATION Take 1 Dose by mouth daily. IRISH SEA MOSS SUPPLEMENT   terbinafine 250 MG tablet Commonly known as: LAMISIL Take 250 mg by mouth daily.               Durable Medical Equipment  (From admission, onward)            Start     Ordered   09/28/22 0000  For home use only DME oxygen       Question Answer Comment  Length of Need Lifetime   Mode or (Route) Nasal cannula   Liters per Minute 4   Frequency Continuous (stationary and portable oxygen unit needed)   Oxygen conserving device Yes   Oxygen delivery system Gas      09/28/22 1453            Discharge Exam: Filed Weights   09/23/22 1919  Weight: 74.4 kg   GEN: NAD SKIN: No rash EYES: EOMI ENT: MMM CV: RRR PULM: CTA B ABD: soft, ND, NT, +BS CNS: AAO x 3, non focal EXT: No edema or tenderness   Condition at discharge: good  The results of significant diagnostics from this hospitalization (including imaging, microbiology, ancillary and laboratory) are listed below for reference.   Imaging Studies: DG Chest Port 1  View  Result Date: 09/24/2022 CLINICAL DATA:  Respiratory distress EXAM: PORTABLE CHEST 1 VIEW COMPARISON:  Radiographs 09/23/2022 FINDINGS: Stable cardiomediastinal silhouette. No focal consolidation, pleural effusion, or pneumothorax. Hyperinflation and interstitial coarsening. Scarring/atelectasis left lower lung. No acute osseous abnormality. Remote left rib fractures. Surgical clips right neck. IMPRESSION: No active disease.  Emphysema. Electronically Signed   By: Placido Sou M.D.   On: 09/24/2022 01:53   DG Chest Port 1 View  Result Date: 09/23/2022 CLINICAL DATA:  Shortness of breath EXAM: PORTABLE CHEST 1 VIEW COMPARISON:  07/15/2021 FINDINGS: The heart size and mediastinal contours are within normal limits. Emphysema. Mild diffuse bilateral interstitial pulmonary opacity. The visualized skeletal structures are unremarkable. IMPRESSION: 1. Mild diffuse bilateral interstitial pulmonary opacity, which may reflect edema or atypical/viral infection. No focal airspace opacity. 2.  Emphysema. Electronically Signed   By: Delanna Ahmadi M.D.   On: 09/23/2022 19:34   DG Cervical Spine Complete  Result Date:  09/10/2022 CLINICAL DATA:  Right arm pain for 2-3 months.  No injury. EXAM: CERVICAL SPINE - COMPLETE 4+ VIEW COMPARISON:  None Available. FINDINGS: Mild reversal the normal cervical lordosis, apex at C5-C6. No spondylolisthesis. Mild loss of disc height at C3-C4. Moderate loss of disc height at C4-C5 and C5-C6. Mild to moderate loss of disc height at C6-C7. Mild endplate and uncovertebral spurring at these levels. Neural foraminal narrowing on the right at C3-C4 and C4-C5 due to uncovertebral spurring. The severity of this finding appears overestimated due to under obliquity. No other evidence of significant neural foraminal narrowing. Right anterior neck base surgical vascular clips consistent with prior thyroid surgery. IMPRESSION: 1. No fracture or acute finding.  No spondylolisthesis. 2. Disc and facet degenerative changes. Possible significant neural foraminal narrowing on the right at C3-C4 and C4-C5. Electronically Signed   By: Lajean Manes M.D.   On: 09/10/2022 13:01   DG Shoulder Right  Result Date: 09/10/2022 CLINICAL DATA:  Right arm pain for 2-3 months.  No injury. EXAM: RIGHT SHOULDER - 2+ VIEW COMPARISON:  None Available. FINDINGS: No fracture or bone lesion. The glenohumeral and AC joints are normally spaced and aligned. Soft tissues are unremarkable. IMPRESSION: Negative. Electronically Signed   By: Lajean Manes M.D.   On: 09/10/2022 12:58    Microbiology: Results for orders placed or performed during the hospital encounter of 09/23/22  Resp panel by RT-PCR (RSV, Flu A&B, Covid) Anterior Nasal Swab     Status: Abnormal   Collection Time: 09/23/22  7:27 PM   Specimen: Anterior Nasal Swab  Result Value Ref Range Status   SARS Coronavirus 2 by RT PCR NEGATIVE NEGATIVE Final    Comment: (NOTE) SARS-CoV-2 target nucleic acids are NOT DETECTED.  The SARS-CoV-2 RNA is generally detectable in upper respiratory specimens during the acute phase of infection. The lowest concentration of  SARS-CoV-2 viral copies this assay can detect is 138 copies/mL. A negative result does not preclude SARS-Cov-2 infection and should not be used as the sole basis for treatment or other patient management decisions. A negative result may occur with  improper specimen collection/handling, submission of specimen other than nasopharyngeal swab, presence of viral mutation(s) within the areas targeted by this assay, and inadequate number of viral copies(<138 copies/mL). A negative result must be combined with clinical observations, patient history, and epidemiological information. The expected result is Negative.  Fact Sheet for Patients:  EntrepreneurPulse.com.au  Fact Sheet for Healthcare Providers:  IncredibleEmployment.be  This test is no t yet approved or cleared  by the [place] and  has been authorized for detection and/or diagnosis of SARS-CoV-2 by FDA under an Emergency Use Authorization (EUA). This EUA will remain  in effect (meaning this test can be used) for the duration of the COVID-19 declaration under Section 564(b)(1) of the Act, 21 U.S.C.section 360bbb-3(b)(1), unless the authorization is terminated  or revoked sooner.       Influenza A by PCR POSITIVE (A) NEGATIVE Final   Influenza B by PCR NEGATIVE NEGATIVE Final    Comment: (NOTE) The Xpert Xpress SARS-CoV-2/FLU/RSV plus assay is intended as an aid in the diagnosis of influenza from Nasopharyngeal swab specimens and should not be used as a sole basis for treatment. Nasal washings and aspirates are unacceptable for Xpert Xpress SARS-CoV-2/FLU/RSV testing.  Fact Sheet for Patients: EntrepreneurPulse.com.au  Fact Sheet for Healthcare Providers: IncredibleEmployment.be  This test is not yet approved or cleared by the [place] FDA and has been authorized for detection and/or diagnosis of SARS-CoV-2 by FDA under an Emergency Use  Authorization (EUA). This EUA will remain in effect (meaning this test can be used) for the duration of the COVID-19 declaration under Section 564(b)(1) of the Act, 21 U.S.C. section 360bbb-3(b)(1), unless the authorization is terminated or revoked.     Resp Syncytial Virus by PCR NEGATIVE NEGATIVE Final    Comment: (NOTE) Fact Sheet for Patients: EntrepreneurPulse.com.au  Fact Sheet for Healthcare Providers: IncredibleEmployment.be  This test is not yet approved or cleared by the [place] FDA and has been authorized for detection and/or diagnosis of SARS-CoV-2 by FDA under an Emergency Use Authorization (EUA). This EUA will remain in effect (meaning this test can be used) for the duration of the COVID-19 declaration under Section 564(b)(1) of the Act, 21 U.S.C. section 360bbb-3(b)(1), unless the authorization is terminated or revoked.  Performed at [facility], [address] [place], [place] [address]     Labs: CBC: Recent Labs  Lab 09/23/22 1928 09/24/22 0012 09/24/22 0448 09/27/22 0617  WBC 10.2 9.8 10.4 10.7*  NEUTROABS 9.6*  --   --   --   HGB 13.6 13.3 12.6 12.5  HCT 43.1 42.8 40.4 38.3  MCV 86.5 88.4 88.0 85.7  PLT 323 293 271 711   Basic Metabolic Panel: Recent Labs  Lab 09/23/22 1928 09/24/22 0012 09/24/22 0448  NA 139  --  141  K 3.3*  --  3.9  CL 102  --  106  CO2 26  --  24  GLUCOSE 249*  --  160*  BUN 12  --  11  CREATININE 0.73 0.77 0.82  CALCIUM 8.6*  --  8.0*   Liver Function Tests: Recent Labs  Lab 09/23/22 1928 09/24/22 0448  AST 28 31  ALT 18 18  ALKPHOS 100 93  BILITOT 0.7 0.6  PROT 7.6 7.5  ALBUMIN 3.9 3.6   CBG: Recent Labs  Lab 09/27/22 1648 09/27/22 2043 09/28/22 0758 09/28/22 1232 09/28/22 1526  GLUCAP 138* 138* 88 196* 193*    Discharge time spent: greater than 30 minutes.  Signed: Jennye Boroughs, MD [facility] 09/28/2022

## 2022-09-28 NOTE — Progress Notes (Signed)
Discharge instructions given. Pt. Aox4, independent, Room air in bed, NSR, no complaints, not in distress. Waiting on son to pick her up.

## 2022-09-28 NOTE — Progress Notes (Signed)
Pulmonary Individual Treatment Plan  Patient Details  Name: Cindy Gutierrez MRN: [id number] Date of Birth: 07/05/1962 Referring Provider:   Flowsheet Row Pulmonary Rehab from 09/08/2022 in [facility] Cardiac and Pulmonary Rehab  Referring Provider Bobbye Charleston MD       Initial Encounter Date:  Flowsheet Row Pulmonary Rehab from 09/08/2022 in [facility] Cardiac and Pulmonary Rehab  Date 09/08/22       Visit Diagnosis: Chronic obstructive pulmonary disease, unspecified COPD type ([place])  Dyspnea, unspecified type  Patient's Home Medications on Admission: No current facility-administered medications for this visit. No current outpatient medications on file.  Facility-Administered Medications Ordered in Other Visits:    acetaminophen (TYLENOL) tablet 650 mg, 650 mg, Oral, Q6H PRN, Jennye Boroughs, MD, 650 mg at 09/27/22 2251   acetylcysteine (MUCOMYST) 20 % nebulizer / oral solution 2 mL, 2 mL, Nebulization, TID, Jennye Boroughs, MD, 2 mL at 09/28/22 0854   albuterol (PROVENTIL) (2.5 MG/3ML) 0.083% nebulizer solution 2.5 mg, 2.5 mg, Nebulization, Q2H PRN, Gertie Fey, MD, 2.5 mg at 09/25/22 1019   amLODipine (NORVASC) tablet 10 mg, 10 mg, Oral, Daily, Gertie Fey, MD, 10 mg at 09/28/22 0941   aspirin chewable tablet 81 mg, 81 mg, Oral, Daily, Gertie Fey, MD, 81 mg at 09/28/22 0932   chlorthalidone (HYGROTON) tablet 12.5 mg, 12.5 mg, Oral, Daily, Gertie Fey, MD, 12.5 mg at 09/28/22 0933   diltiazem (CARDIZEM) injection 15 mg, 15 mg, Intravenous, Q6H PRN, Gertie Fey, MD, 15 mg at 09/25/22 2114   enoxaparin (LOVENOX) injection 40 mg, 40 mg, Subcutaneous, Q24H, Gertie Fey, MD, 40 mg at 09/24/22 0031   feeding supplement (BOOST / RESOURCE BREEZE) liquid 1 Container, 1 Container, Oral, TID BM, Jennye Boroughs, MD, 1 Container at 09/27/22 2032   fluticasone furoate-vilanterol (BREO ELLIPTA) 200-25 MCG/ACT 1 puff, 1 puff, Inhalation, Daily, Jennye Boroughs, MD, 1 puff at 09/28/22 0935   gabapentin  (NEURONTIN) capsule 300 mg, 300 mg, Oral, BID, Jennye Boroughs, MD, 300 mg at 09/28/22 0932   Glecaprevir-Pibrentasvir 100-40 MG TABS 3 tablet, 3 tablet, Oral, Daily, Gertie Fey, MD   guaiFENesin-dextromethorphan (ROBITUSSIN DM) 100-10 MG/5ML syrup 5 mL, 5 mL, Oral, Q4H PRN, Jennye Boroughs, MD, 5 mL at 09/27/22 2030   hydrALAZINE (APRESOLINE) injection 10 mg, 10 mg, Intravenous, Q4H PRN, Gertie Fey, MD, 10 mg at 09/24/22 0050   insulin aspart (novoLOG) injection 0-15 Units, 0-15 Units, Subcutaneous, TID WC, Gertie Fey, MD, 2 Units at 09/27/22 1701   insulin aspart (novoLOG) injection 0-5 Units, 0-5 Units, Subcutaneous, QHS, Gertie Fey, MD, 2 Units at 09/24/22 0039   ipratropium-albuterol (DUONEB) 0.5-2.5 (3) MG/3ML nebulizer solution 3 mL, 3 mL, Nebulization, Q4H PRN, Sharion Settler, NP, 3 mL at 09/25/22 1754   ipratropium-albuterol (DUONEB) 0.5-2.5 (3) MG/3ML nebulizer solution 3 mL, 3 mL, Nebulization, Q6H, Jennye Boroughs, MD, 3 mL at 09/28/22 0854   LORazepam (ATIVAN) tablet 0.5 mg, 0.5 mg, Oral, Once, Jennye Boroughs, MD   multivitamin with minerals tablet 1 tablet, 1 tablet, Oral, Daily, Jennye Boroughs, MD, 1 tablet at 09/28/22 0932   nicotine (NICODERM CQ - dosed in mg/24 hours) patch 14 mg, 14 mg, Transdermal, Daily, Gertie Fey, MD, 14 mg at 09/28/22 3016   nicotine polacrilex (NICORETTE) gum 2 mg, 2 mg, Oral, PRN, Gertie Fey, MD   oseltamivir (TAMIFLU) capsule 75 mg, 75 mg, Oral, BID, Jennye Boroughs, MD, 75 mg at 09/28/22 0932   pantoprazole (PROTONIX) EC tablet 40 mg, 40 mg, Oral, Daily, Jennye Boroughs, MD, 40 mg at 09/27/22  2030  Past Medical History: Past Medical History:  Diagnosis Date   Aortic valve regurgitation    COPD (chronic obstructive pulmonary disease) (HCC)    Depression    Hepatitis C    Hyperparathyroidism ([place])    Hypertension    Mitral valve regurgitation    Non-toxic multinodular goiter    Pulmonary hypertension ([place]) 2022   noted on CT   Pulmonary nodule     Thyroid disease     Tobacco Use: Social History   Tobacco Use  Smoking Status Every Day   Packs/day: 0.25   Years: 45.00   Total pack years: 11.25   Types: Cigarettes  Smokeless Tobacco Never    Labs: Review Flowsheet  More data exists      Latest Ref Rng & Units 03/29/2021 05/23/2021 05/24/2021 09/23/2022 09/24/2022  Labs for ITP Cardiac and Pulmonary Rehab  Hemoglobin A1c 4.8 - 5.6 % - - 5.9  - 6.1   PH, Arterial 7.35 - 7.45 - 7.47  - - 7.39   PCO2 arterial 32 - 48 mmHg - 31  - - 38   Bicarbonate 20.0 - 28.0 mmol/L 27.9  22.6  - 30.4  23.0   Acid-base deficit 0.0 - 2.0 mmol/L - 0.2  - - 1.7   O2 Saturation % 69.0  99.2  - 94.4  99.8      Pulmonary Assessment Scores:  Pulmonary Assessment Scores     Row Name 09/08/22 1240         ADL UCSD   ADL Phase Entry     SOB Score total 72     Rest 0     Walk 3     Stairs 5     Bath 4     Dress 0     Shop 3       CAT Score   CAT Score 24       mMRC Score   mMRC Score 3              UCSD: Self-administered rating of dyspnea associated with activities of daily living (ADLs) 6-point scale (0 = "not at all" to 5 = "maximal or unable to do because of breathlessness")  Scoring Scores range from 0 to 120.  Minimally important difference is 5 units  CAT: CAT can identify the health impairment of COPD patients and is better correlated with disease progression.  CAT has a scoring range of zero to 40. The CAT score is classified into four groups of low (less than 10), medium (10 - 20), high (21-30) and very high (31-40) based on the impact level of disease on health status. A CAT score over 10 suggests significant symptoms.  A worsening CAT score could be explained by an exacerbation, poor medication adherence, poor inhaler technique, or progression of COPD or comorbid conditions.  CAT MCID is 2 points  mMRC: mMRC (Modified Medical Research Council) Dyspnea Scale is used to assess the degree of baseline functional  disability in patients of respiratory disease due to dyspnea. No minimal important difference is established. A decrease in score of 1 point or greater is considered a positive change.   Pulmonary Function Assessment:   Exercise Target Goals: Exercise Program Goal: Individual exercise prescription set using results from initial 6 min walk test and THRR while considering  patient's activity barriers and safety.   Exercise Prescription Goal: Initial exercise prescription builds to 30-45 minutes a day of aerobic activity, 2-3 days per week.  Home exercise  guidelines will be given to patient during program as part of exercise prescription that the participant will acknowledge.  Education: Aerobic Exercise: - Group verbal and visual presentation on the components of exercise prescription. Introduces F.I.T.T principle from ACSM for exercise prescriptions.  Reviews F.I.T.T. principles of aerobic exercise including progression. Written material given at graduation.   Education: Resistance Exercise: - Group verbal and visual presentation on the components of exercise prescription. Introduces F.I.T.T principle from ACSM for exercise prescriptions  Reviews F.I.T.T. principles of resistance exercise including progression. Written material given at graduation.    Education: Exercise & Equipment Safety: - Individual verbal instruction and demonstration of equipment use and safety with use of the equipment. Flowsheet Row Pulmonary Rehab from 09/08/2022 in [facility] Cardiac and Pulmonary Rehab  Date 09/08/22  Educator NT  Instruction Review Code 1- Verbalizes Understanding       Education: Exercise Physiology & General Exercise Guidelines: - Group verbal and written instruction with models to review the exercise physiology of the cardiovascular system and associated critical values. Provides general exercise guidelines with specific guidelines to those with heart or lung disease.    Education:  Flexibility, Balance, Mind/Body Relaxation: - Group verbal and visual presentation with interactive activity on the components of exercise prescription. Introduces F.I.T.T principle from ACSM for exercise prescriptions. Reviews F.I.T.T. principles of flexibility and balance exercise training including progression. Also discusses the mind body connection.  Reviews various relaxation techniques to help reduce and manage stress (i.e. Deep breathing, progressive muscle relaxation, and visualization). Balance handout provided to take home. Written material given at graduation.   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 HR 94 bpm     Max Ex. BP 158/82     2 Minute Post BP 146/76       Interval HR   1 Minute HR 84     2 Minute HR 88     3 Minute HR 91     4 Minute HR 92     5 Minute HR 93     6 Minute HR 94     2 Minute Post HR 75     Interval Heart Rate? Yes       Interval Oxygen   Interval Oxygen? Yes     Baseline Oxygen Saturation % 97 %     1 Minute  Oxygen Saturation % 94 %     1 Minute Liters of Oxygen 0 L  RA     2 Minute Oxygen Saturation % 94 %     2 Minute Liters of Oxygen 0 L  RA     3 Minute Oxygen Saturation % 94 %     3 Minute Liters of Oxygen 0 L  RA     4  Minute Oxygen Saturation % 95 %     4 Minute Liters of Oxygen 0 L  RA     5 Minute Oxygen Saturation % 95 %     5 Minute Liters of Oxygen 0 L  RA     6 Minute Oxygen Saturation % 92 %     6 Minute Liters of Oxygen 0 L  RA     2 Minute Post Oxygen Saturation % 98 %     2 Minute Post Liters of Oxygen 0 L  RA             Oxygen Initial Assessment:  Oxygen Initial Assessment - 08/30/22 1023       Home Oxygen   Home Oxygen Device None    Home Exercise Oxygen Prescription None    Home Resting Oxygen Prescription None      Intervention   Short Term Goals To learn and understand importance of maintaining oxygen saturations>88%;To learn and demonstrate proper pursed lip breathing techniques or other breathing techniques. ;To learn and demonstrate proper use of respiratory medications;To learn and understand importance of monitoring SPO2 with pulse oximeter and demonstrate accurate use of the pulse oximeter.    Long  Term Goals Maintenance of O2 saturations>88%;Exhibits proper breathing techniques, such as pursed lip breathing or other method taught during program session;Compliance with respiratory medication;Demonstrates proper use of MDI's             Oxygen Re-Evaluation:  Oxygen Re-Evaluation     Row Name 09/20/22 1119             Goals/Expected Outcomes   Short Term Goals To learn and understand importance of maintaining oxygen saturations>88%;To learn and demonstrate proper pursed lip breathing techniques or other breathing techniques. ;To learn and demonstrate proper use of respiratory medications;To learn and understand importance of monitoring SPO2 with pulse oximeter and demonstrate accurate use of the pulse oximeter.       Long  Term Goals Maintenance of O2 saturations>88%;Exhibits proper breathing techniques, such as pursed lip breathing or other method taught during program session;Compliance with respiratory medication;Demonstrates proper use of MDI's       Comments  Reviewed PLB technique with pt.  Talked about how it works and it's importance in maintaining their exercise saturations.       Goals/Expected Outcomes Short: Become more profiecient at using PLB.   Long: Become independent at using PLB.                Oxygen Discharge (Final Oxygen Re-Evaluation):  Oxygen Re-Evaluation - 09/20/22 1119       Goals/Expected Outcomes   Short Term Goals To learn and understand importance of maintaining oxygen saturations>88%;To learn and demonstrate proper pursed lip breathing techniques or other breathing techniques. ;To learn and demonstrate proper use of respiratory medications;To learn and understand importance of monitoring SPO2 with pulse oximeter and demonstrate accurate use of the pulse oximeter.    Long  Term Goals Maintenance of O2 saturations>88%;Exhibits proper breathing techniques, such as pursed lip breathing or other method taught during program session;Compliance  with respiratory medication;Demonstrates proper use of MDI's    Comments Reviewed PLB technique with pt.  Talked about how it works and it's importance in maintaining their exercise saturations.    Goals/Expected Outcomes Short: Become more profiecient at using PLB.   Long: Become independent at using PLB.             Initial Exercise Prescription:  Initial Exercise Prescription - 09/08/22 1300       Date of Initial Exercise RX and Referring Provider   Date 09/08/22    Referring Provider Bobbye Charleston MD      Oxygen   Maintain Oxygen Saturation 88% or higher      Treadmill   MPH 1.8    Grade 1    Minutes 15    METs 2.63      NuStep   Level 2    SPM 80    Minutes 15    METs 3.21      REL-XR   Level 1    Watts 29    Speed 50    Minutes 15    METs 3.21      T5 Nustep   Level 1    SPM 80    Minutes 15    METs 3.21      Prescription Details   Frequency (times per week) 2    Duration Progress to 30 minutes of continuous aerobic without signs/symptoms of  physical distress      Intensity   THRR 40-80% of Max Heartrate 100-138    Ratings of Perceived Exertion 11-13    Perceived Dyspnea 0-4      Progression   Progression Continue to progress workloads to maintain intensity without signs/symptoms of physical distress.      Resistance Training   Training Prescription Yes    Weight 3 lb    Reps 10-15             Perform Capillary Blood Glucose checks as needed.  Exercise Prescription Changes:   Exercise Prescription Changes     Row Name 09/08/22 1400             Response to Exercise   Blood Pressure (Admit) 130/66       Blood Pressure (Exercise) 158/82       Blood Pressure (Exit) 146/76       Heart Rate (Admit) 62 bpm       Heart Rate (Exercise) 94 bpm       Heart Rate (Exit) 75 bpm       Oxygen Saturation (Admit) 97 %       Oxygen Saturation (Exercise) 92 %       Oxygen Saturation (Exit) 98 %       Rating of Perceived Exertion (Exercise) 11       Perceived Dyspnea (Exercise) 2       Symptoms SOB, right leg fatigue       Comments 6MWT Results                Exercise Comments:   Exercise Comments     Row Name 09/20/22 1117           Exercise Comments First full day of exercise!  Patient was oriented to gym and equipment including functions, settings, policies, and procedures.  Patient's individual exercise prescription and treatment plan were reviewed.  All starting workloads were established based on the results of the 6 minute walk test done at initial orientation visit.  The plan  for exercise progression was also introduced and progression will be customized based on patient's performance and goals.                Exercise Goals and Review:   Exercise Goals     Row Name 09/08/22 1357             Exercise Goals   Increase Physical Activity Yes       Intervention Provide advice, education, support and counseling about physical activity/exercise needs.;Develop an individualized exercise  prescription for aerobic and resistive training based on initial evaluation findings, risk stratification, comorbidities and participant's personal goals.       Expected Outcomes Short Term: Attend rehab on a regular basis to increase amount of physical activity.;Long Term: Add in home exercise to make exercise part of routine and to increase amount of physical activity.;Long Term: Exercising regularly at least 3-5 days a week.       Increase Strength and Stamina Yes       Intervention Provide advice, education, support and counseling about physical activity/exercise needs.;Develop an individualized exercise prescription for aerobic and resistive training based on initial evaluation findings, risk stratification, comorbidities and participant's personal goals.       Expected Outcomes Short Term: Increase workloads from initial exercise prescription for resistance, speed, and METs.;Short Term: Perform resistance training exercises routinely during rehab and add in resistance training at home;Long Term: Improve cardiorespiratory fitness, muscular endurance and strength as measured by increased METs and functional capacity (6MWT)       Able to understand and use rate of perceived exertion (RPE) scale Yes       Intervention Provide education and explanation on how to use RPE scale       Expected Outcomes Long Term:  Able to use RPE to guide intensity level when exercising independently;Short Term: Able to use RPE daily in rehab to express subjective intensity level       Able to understand and use Dyspnea scale Yes       Intervention Provide education and explanation on how to use Dyspnea scale       Expected Outcomes Short Term: Able to use Dyspnea scale daily in rehab to express subjective sense of shortness of breath during exertion;Long Term: Able to use Dyspnea scale to guide intensity level when exercising independently       Knowledge and understanding of Target Heart Rate Range (THRR) Yes        Intervention Provide education and explanation of THRR including how the numbers were predicted and where they are located for reference       Expected Outcomes Short Term: Able to state/look up THRR;Long Term: Able to use THRR to govern intensity when exercising independently;Short Term: Able to use daily as guideline for intensity in rehab       Able to check pulse independently Yes       Intervention Provide education and demonstration on how to check pulse in carotid and radial arteries.;Review the importance of being able to check your own pulse for safety during independent exercise       Expected Outcomes Short Term: Able to explain why pulse checking is important during independent exercise;Long Term: Able to check pulse independently and accurately       Understanding of Exercise Prescription Yes       Intervention Provide education, explanation, and written materials on patient's individual exercise prescription       Expected Outcomes Short Term: Able to explain program  exercise prescription;Long Term: Able to explain home exercise prescription to exercise independently                Exercise Goals Re-Evaluation :  Exercise Goals Re-Evaluation     [place] Name 09/20/22 1118             Exercise Goal Re-Evaluation   Exercise Goals Review Increase Physical Activity;Able to understand and use rate of perceived exertion (RPE) scale;Knowledge and understanding of Target Heart Rate Range (THRR);Understanding of Exercise Prescription;Increase Strength and Stamina;Able to understand and use Dyspnea scale;Able to check pulse independently       Comments Reviewed RPE scale, THR and program prescription with pt today.  Pt voiced understanding and was given a copy of goals to take home.       Expected Outcomes Short: Use RPE daily to regulate intensity.  Long: Follow program prescription in THR.                Discharge Exercise Prescription (Final Exercise Prescription Changes):   Exercise Prescription Changes - 09/08/22 1400       Response to Exercise   Blood Pressure (Admit) 130/66    Blood Pressure (Exercise) 158/82    Blood Pressure (Exit) 146/76    Heart Rate (Admit) 62 bpm    Heart Rate (Exercise) 94 bpm    Heart Rate (Exit) 75 bpm    Oxygen Saturation (Admit) 97 %    Oxygen Saturation (Exercise) 92 %    Oxygen Saturation (Exit) 98 %    Rating of Perceived Exertion (Exercise) 11    Perceived Dyspnea (Exercise) 2    Symptoms SOB, right leg fatigue    Comments 6MWT Results             Nutrition:  Target Goals: Understanding of nutrition guidelines, daily intake of sodium '1500mg'$ , cholesterol '200mg'$ , calories 30% from fat and 7% or less from saturated fats, daily to have 5 or more servings of fruits and vegetables.  Education: All About Nutrition: -Group instruction provided by verbal, written material, interactive activities, discussions, models, and posters to present general guidelines for heart healthy nutrition including fat, fiber, MyPlate, the role of sodium in heart healthy nutrition, utilization of the nutrition label, and utilization of this knowledge for meal planning. Follow up email sent as well. Written material given at graduation.   Biometrics:  Pre Biometrics - 09/08/22 1358       Pre Biometrics   Height '5\' 10"'$  (1.778 m)    Weight 165 lb 4.8 oz (75 kg)    Waist Circumference 36.5 inches    Hip Circumference 41 inches    Waist to Hip Ratio 0.89 %    BMI (Calculated) 23.72    Single Leg Stand 3.5 seconds   R             Nutrition Therapy Plan and Nutrition Goals:  Nutrition Therapy & Goals - 09/13/22 1308       Nutrition Therapy   Diet Heart healthy, low Na    Protein (specify units) 80-90g    Fiber 25 grams    Whole Grain Foods 3 servings    Saturated Fats 12 max. grams    Fruits and Vegetables 8 servings/day    Sodium 1.5 grams      Personal Nutrition Goals   Nutrition Goal ST: practice MyPlate guidelines,  limit sugar sweetened beverages LT: limit Na <1.5g/day, include at least 25g fiber/day    Comments 61 y.o. F admitted to pulmonary rehab  for COPD. PMHx includes HTN, tobacco use, depression. Relevant medications includes prednisone, hydrocodone, tums as needed, levaquin, furosemide PRN, sea moss, Calcium and vit D3 prescription - she has not been taking them as regularly. PYP Score: 42. Vegetables & Fruits 6/12. Breads, Grains & Cereals 8/12. Red & Processed Meat 6/12. Poultry 2/2. Fish & Shellfish 0/4. Beans, Nuts & Seeds 1/4. Milk & Dairy Foods 4/6. Toppings, Oils, Seasonings & Salt 6/20. Sweets, Snacks & Restaurant Food 3/14. Beverages 6/10. Suggested Karrin speak with her MD to ensure that taking sea moss would be ok for her as it is high in iodine. Grizel reports eating a lot of cheese and yogurt, she is trying to stay away from red meat, but she is getting tired of chicken. She will have cheese toast 2x/day (white and whole wheat) as well as fresh fruit and vegetables during the day. She likes bacon and breakfast sausages. She likes to eat when she gets hungry. Drinks: watermelon juice, sprite, gingerale, apple juice, regular gatorade, spring water. Discussed general heart healthy eating and pulmonary MNT.      Intervention Plan   Intervention Prescribe, educate and counsel regarding individualized specific dietary modifications aiming towards targeted core components such as weight, hypertension, lipid management, diabetes, heart failure and other comorbidities.    Expected Outcomes Short Term Goal: Understand basic principles of dietary content, such as calories, fat, sodium, cholesterol and nutrients.;Short Term Goal: A plan has been developed with personal nutrition goals set during dietitian appointment.;Long Term Goal: Adherence to prescribed nutrition plan.             Nutrition Assessments:  MEDIFICTS Score Key: ?70 Need to make dietary changes  40-70 Heart Healthy Diet ? 40 Therapeutic  Level Cholesterol Diet  Flowsheet Row Pulmonary Rehab from 09/08/2022 in [facility] Cardiac and Pulmonary Rehab  Picture Your Plate Total Score on Admission 42      Picture Your Plate Scores: <67 Unhealthy dietary pattern with much room for improvement. 41-50 Dietary pattern unlikely to meet recommendations for good health and room for improvement. 51-60 More healthful dietary pattern, with some room for improvement.  >60 Healthy dietary pattern, although there may be some specific behaviors that could be improved.   Nutrition Goals Re-Evaluation:   Nutrition Goals Discharge (Final Nutrition Goals Re-Evaluation):   Psychosocial: Target Goals: Acknowledge presence or absence of significant depression and/or stress, maximize coping skills, provide positive support system. Participant is able to verbalize types and ability to use techniques and skills needed for reducing stress and depression.   Education: Stress, Anxiety, and Depression - Group verbal and visual presentation to define topics covered.  Reviews how body is impacted by stress, anxiety, and depression.  Also discusses healthy ways to reduce stress and to treat/manage anxiety and depression.  Written material given at graduation.   Education: Sleep Hygiene -Provides group verbal and written instruction about how sleep can affect your health.  Define sleep hygiene, discuss sleep cycles and impact of sleep habits. Review good sleep hygiene tips.    Initial Review & Psychosocial Screening:  Initial Psych Review & Screening - 08/30/22 1023       Initial Review   Current issues with None Identified      Family Dynamics   Good Support System? Yes   2 sons live with her     Barriers   Psychosocial barriers to participate in program There are no identifiable barriers or psychosocial needs.      Screening Interventions   Interventions Encouraged  to exercise;To provide support and resources with identified psychosocial  needs;Provide feedback about the scores to participant    Expected Outcomes Short Term goal: Utilizing psychosocial counselor, staff and physician to assist with identification of specific Stressors or current issues interfering with healing process. Setting desired goal for each stressor or current issue identified.;Long Term Goal: Stressors or current issues are controlled or eliminated.;Short Term goal: Identification and review with participant of any Quality of Life or Depression concerns found by scoring the questionnaire.;Long Term goal: The participant improves quality of Life and PHQ9 Scores as seen by post scores and/or verbalization of changes             Quality of Life Scores:  Scores of 19 and below usually indicate a poorer quality of life in these areas.  A difference of  2-3 points is a clinically meaningful difference.  A difference of 2-3 points in the total score of the Quality of Life Index has been associated with significant improvement in overall quality of life, self-image, physical symptoms, and general health in studies assessing change in quality of life.  PHQ-9: Review Flowsheet       09/08/2022  Depression screen PHQ 2/9  Decreased Interest 2  Down, Depressed, Hopeless 3  PHQ - 2 Score 5  Altered sleeping 2  Tired, decreased energy 3  Change in appetite 2  Feeling bad or failure about yourself  0  Trouble concentrating 0  Moving slowly or fidgety/restless 0  Suicidal thoughts 0  PHQ-9 Score 12  Difficult doing work/chores Somewhat difficult   Interpretation of Total Score  Total Score Depression Severity:  1-4 = Minimal depression, 5-9 = Mild depression, 10-14 = Moderate depression, 15-19 = Moderately severe depression, 20-27 = Severe depression   Psychosocial Evaluation and Intervention:  Psychosocial Evaluation - 08/30/22 1040       Psychosocial Evaluation & Interventions   Comments Dorlisa has no barriers to attending the program. She lives with  her 2 grown sons. They are her support. She wants to improve her COPD symptoms. She is still smoking about 2 cigarettes a day. She was given info on the [place] Quitline and she ahs access to any meds from her [place] caregiver.  She will schedule for 2 days a week ,secondary to transportation needs.    Expected Outcomes STG Brittania is able to attend her scheduled sessions. She is able to set a Quit date and complete her tobacco cessation. She is able to see progression with her exercise and a decrease in her symptoms.    Continue Psychosocial Services  Follow up required by staff             Psychosocial Re-Evaluation:   Psychosocial Discharge (Final Psychosocial Re-Evaluation):   Education: Education Goals: Education classes will be provided on a weekly basis, covering required topics. Participant will state understanding/return demonstration of topics presented.  Learning Barriers/Preferences:   General Pulmonary Education Topics:  Infection Prevention: - Provides verbal and written material to individual with discussion of infection control including proper hand washing and proper equipment cleaning during exercise session. Flowsheet Row Pulmonary Rehab from 09/08/2022 in [facility] Cardiac and Pulmonary Rehab  Date 09/08/22  Educator NT  Instruction Review Code 1- Verbalizes Understanding       Falls Prevention: - Provides verbal and written material to individual with discussion of falls prevention and safety. Flowsheet Row Pulmonary Rehab from 09/08/2022 in [facility] Cardiac and Pulmonary Rehab  Date 08/30/22  Educator SB  Instruction Review Code  1- Verbalizes Understanding       Chronic Lung Disease Review: - Group verbal instruction with posters, models, PowerPoint presentations and videos,  to review new updates, new respiratory medications, new advancements in procedures and treatments. Providing information on websites and "800" numbers for continued self-education. Includes  information about supplement oxygen, available portable oxygen systems, continuous and intermittent flow rates, oxygen safety, concentrators, and Medicare reimbursement for oxygen. Explanation of Pulmonary Drugs, including class, frequency, complications, importance of spacers, rinsing mouth after steroid MDI's, and proper cleaning methods for nebulizers. Review of basic lung anatomy and physiology related to function, structure, and complications of lung disease. Review of risk factors. Discussion about methods for diagnosing sleep apnea and types of masks and machines for OSA. Includes a review of the use of types of environmental controls: home humidity, furnaces, filters, dust mite/pet prevention, HEPA vacuums. Discussion about weather changes, air quality and the benefits of nasal washing. Instruction on Warning signs, infection symptoms, calling MD promptly, preventive modes, and value of vaccinations. Review of effective airway clearance, coughing and/or vibration techniques. Emphasizing that all should Create an Action Plan. Written material given at graduation.   AED/CPR: - Group verbal and written instruction with the use of models to demonstrate the basic use of the AED with the basic ABC's of resuscitation.    Anatomy and Cardiac Procedures: - Group verbal and visual presentation and models provide information about basic cardiac anatomy and function. Reviews the testing methods done to diagnose heart disease and the outcomes of the test results. Describes the treatment choices: Medical Management, Angioplasty, or Coronary Bypass Surgery for treating various heart conditions including Myocardial Infarction, Angina, Valve Disease, and Cardiac Arrhythmias.  Written material given at graduation.   Medication Safety: - Group verbal and visual instruction to review commonly prescribed medications for heart and lung disease. Reviews the medication, class of the drug, and side effects. Includes the  steps to properly store meds and maintain the prescription regimen.  Written material given at graduation.   Other: -Provides group and verbal instruction on various topics (see comments)   Knowledge Questionnaire Score:  Knowledge Questionnaire Score - 09/08/22 1239       Knowledge Questionnaire Score   Pre Score 16/18              Core Components/Risk Factors/Patient Goals at Admission:  Personal Goals and Risk Factors at Admission - 09/08/22 1239       Core Components/Risk Factors/Patient Goals on Admission    Weight Management Yes    Intervention Weight Management: Develop a combined nutrition and exercise program designed to reach desired caloric intake, while maintaining appropriate intake of nutrient and fiber, sodium and fats, and appropriate energy expenditure required for the weight goal.;Weight Management: Provide education and appropriate resources to help participant work on and attain dietary goals.;Weight Management/Obesity: Establish reasonable short term and long term weight goals.    Admit Weight 165 lb 4.8 oz (75 kg)    Goal Weight: Short Term 160 lb (72.6 kg)    Goal Weight: Long Term 150 lb (68 kg)    Expected Outcomes Short Term: Continue to assess and modify interventions until short term weight is achieved;Long Term: Adherence to nutrition and physical activity/exercise program aimed toward attainment of established weight goal;Weight Loss: Understanding of general recommendations for a balanced deficit meal plan, which promotes 1-2 lb weight loss per week and includes a negative energy balance of [phone] kcal/d    Tobacco Cessation Yes    Number  of packs per day Thurma is a current tobacco user. Intervention for tobacco cessation was provided at the initial medical review. She was asked about readiness to quit and reported she wants to quit and is down to 2 cigarettes a day.  . Patient was advised and educated about tobacco cessation using combination therapy,  tobacco cessation classes, quit line, and quit smoking apps. Patient demonstrated understanding of this material. Staff will continue to provide encouragement and follow up with the patient throughout the program.    Intervention Assist the participant in steps to quit. Provide individualized education and counseling about committing to Tobacco Cessation, relapse prevention, and pharmacological support that can be provided by physician.;Advice worker, assist with locating and accessing local/national Quit Smoking programs, and support quit date choice.    Expected Outcomes Short Term: Will demonstrate readiness to quit, by selecting a quit date.;Short Term: Will quit all tobacco product use, adhering to prevention of relapse plan.;Long Term: Complete abstinence from all tobacco products for at least 12 months from quit date.    Improve shortness of breath with ADL's Yes    Intervention Provide education, individualized exercise plan and daily activity instruction to help decrease symptoms of SOB with activities of daily living.    Expected Outcomes Short Term: Improve cardiorespiratory fitness to achieve a reduction of symptoms when performing ADLs;Long Term: Be able to perform more ADLs without symptoms or delay the onset of symptoms    Increase knowledge of respiratory medications and ability to use respiratory devices properly  Yes    Intervention Provide education and demonstration as needed of appropriate use of medications, inhalers, and oxygen therapy.    Expected Outcomes Short Term: Achieves understanding of medications use. Understands that oxygen is a medication prescribed by physician. Demonstrates appropriate use of inhaler and oxygen therapy.;Long Term: Maintain appropriate use of medications, inhalers, and oxygen therapy.    Hypertension Yes    Intervention Provide education on lifestyle modifcations including regular physical activity/exercise, weight management, moderate  sodium restriction and increased consumption of fresh fruit, vegetables, and low fat dairy, alcohol moderation, and smoking cessation.;Monitor prescription use compliance.    Expected Outcomes Short Term: Continued assessment and intervention until BP is < 140/72m HG in hypertensive participants. < 130/858mHG in hypertensive participants with diabetes, heart failure or chronic kidney disease.;Long Term: Maintenance of blood pressure at goal levels.             Education:Diabetes - Individual verbal and written instruction to review signs/symptoms of diabetes, desired ranges of glucose level fasting, after meals and with exercise. Acknowledge that pre and post exercise glucose checks will be done for 3 sessions at entry of program.   Know Your Numbers and Heart Failure: - Group verbal and visual instruction to discuss disease risk factors for cardiac and pulmonary disease and treatment options.  Reviews associated critical values for Overweight/Obesity, Hypertension, Cholesterol, and Diabetes.  Discusses basics of heart failure: signs/symptoms and treatments.  Introduces Heart Failure Zone chart for action plan for heart failure.  Written material given at graduation.   Core Components/Risk Factors/Patient Goals Review:    Core Components/Risk Factors/Patient Goals at Discharge (Final Review):    ITP Comments:  ITP Comments     Row Name 08/30/22 1046 09/08/22 1233 09/13/22 1348 09/20/22 1117 09/28/22 1009   ITP Comments Virtual orientation call completed today. shehas an appointment on Date: 09/08/2022  for EP eval and gym Orientation.  Documentation of diagnosis can be found in Media Tab Date:  12/7 .   Sydny is a current tobacco user. Intervention for tobacco cessation was provided at the initial medical review. She was asked about readiness to quit and reported she wants to quit and is down to 2 cigarettes a day.  . Patient was advised and educated about tobacco cessation using  combination therapy, tobacco cessation classes, quit line, and quit smoking apps. Patient demonstrated understanding of this material. Staff will continue to provide encouragement and follow up with the patient throughout the program. Completed 6MWT and gym orientation. Initial ITP created and sent for review to Dr. Ottie Glazier, Medical Director. Completed initial RD consultation First full day of exercise!  Patient was oriented to gym and equipment including functions, settings, policies, and procedures.  Patient's individual exercise prescription and treatment plan were reviewed.  All starting workloads were established based on the results of the 6 minute walk test done at initial orientation visit.  The plan for exercise progression was also introduced and progression will be customized based on patient's performance and goals. 30 Day review completed. Medical Director ITP review done, changes made as directed, and signed approval by Medical Director.     new to program            Comments:

## 2022-09-28 NOTE — Progress Notes (Incomplete)
{  Select_[email or web address]:[id number]} 

## 2022-09-28 NOTE — Progress Notes (Signed)
.  SATURATION QUALIFICATIONS: (This note is used to comply with regulatory documentation for home oxygen)  Patient Saturations on Room Air at Rest = 91%  Patient Saturations on Room Air while Ambulating = 86%  Patient Saturations on 4 Liters of oxygen while Ambulating = 92%  Please briefly explain why patient needs home oxygen: pt. Requires oxygen support at home. Extremely short of breath on ambulation.

## 2022-09-28 NOTE — TOC Transition Note (Signed)
Transition of Care [facility]) - CM/SW Discharge Note   Patient Details  Name: Cindy Gutierrez MRN: [id number] Date of Birth: 11/26/1960  Transition of Care [facility]) CM/SW Contact:  Gerilyn Pilgrim, LCSW Phone Number: 09/28/2022, 2:50 PM   Clinical Narrative:  DC orders in for patient. Oxygen to be delivered to patients room. CSW signing off.      Final next level of care: Home/Self Care Barriers to Discharge: Barriers Resolved   Patient Goals and CMS Choice      Discharge Placement                         Discharge Plan and Services Additional resources added to the After Visit Summary for                    DME Agency: [facility] [employer] Date DME Agency Contacted: 09/28/22 Time DME Agency Contacted: 0230 Representative spoke with at DME Agency: [facility] Determinants of Health ([place]) Interventions SDOH Screenings   Food Insecurity: No Food Insecurity (09/25/2022)  Housing: Low Risk  (09/25/2022)  Transportation Needs: No Transportation Needs (09/25/2022)  Utilities: Not At Risk (09/25/2022)  Depression (PHQ2-9): High Risk (09/08/2022)  Tobacco Use: High Risk (09/24/2022)     Readmission Risk Interventions     No data to display

## 2022-09-29 ENCOUNTER — Ambulatory Visit: Payer: No Typology Code available for payment source

## 2022-10-04 ENCOUNTER — Ambulatory Visit: Payer: No Typology Code available for payment source

## 2022-10-04 DIAGNOSIS — R0600 Dyspnea, unspecified: Secondary | ICD-10-CM

## 2022-10-04 DIAGNOSIS — J449 Chronic obstructive pulmonary disease, unspecified: Secondary | ICD-10-CM

## 2022-10-04 NOTE — Progress Notes (Signed)
Pulmonary Individual Treatment Plan  Patient Details  Name: Cindy Gutierrez MRN: [id number] Date of Birth: [birth year] Referring Provider:   Flowsheet Row Pulmonary Rehab from 09/08/2024 in [facility] Cardiac and Pulmonary Rehab  Referring Provider Bobbye Charleston MD       Initial Encounter Date:  Flowsheet Row Pulmonary Rehab from 09/08/2024 in [facility] Cardiac and Pulmonary Rehab  Date 09/08/22       Visit Diagnosis: Chronic obstructive pulmonary disease, unspecified COPD type ([place])  Dyspnea, unspecified type  Patient's Home Medications on Admission:  Current Outpatient Medications:    albuterol (PROVENTIL HFA;VENTOLIN HFA) 108 (90 Base) MCG/ACT inhaler, Inhale 1-2 puffs into the lungs every 6 (six) hours as needed for wheezing or shortness of breath., Disp: 1 Inhaler, Rfl: 0   albuterol (PROVENTIL) (2.5 MG/3ML) 0.083% nebulizer solution, Take 2.5 mg by nebulization every 6 (six) hours as needed for wheezing or shortness of breath., Disp: , Rfl:    amLODipine (NORVASC) 10 MG tablet, Take 10 mg by mouth daily., Disp: , Rfl:    aspirin 81 MG chewable tablet, Chew 81 mg by mouth daily., Disp: , Rfl:    budesonide-formoterol (SYMBICORT) 160-4.5 MCG/ACT inhaler, Inhale 2 puffs into the lungs 2 (two) times daily., Disp: , Rfl:    calcium carbonate (TUMS - DOSED IN MG ELEMENTAL CALCIUM) 500 MG chewable tablet, Chew 1 tablet by mouth daily. As needed, Disp: , Rfl:    chlorthalidone (HYGROTON) 25 MG tablet, Take 12.5 mg by mouth daily., Disp: , Rfl:    Cholecalciferol 25 MCG (1000 UT) tablet, Take 3,000 Units by mouth daily., Disp: , Rfl:    diclofenac Sodium (VOLTAREN) 1 % GEL, Apply 2 g topically 4 (four) times daily., Disp: , Rfl:    gabapentin (NEURONTIN) 300 MG capsule, Take 300 mg by mouth at bedtime., Disp: , Rfl:    Glecaprevir-Pibrentasvir (MAVYRET) 100-40 MG TABS, Take 3 tablets by mouth daily., Disp: , Rfl:    guaiFENesin (ROBITUSSIN) 100 MG/5ML liquid, Take 5 mLs by mouth every 4 (four)  hours as needed for cough or to loosen phlegm., Disp: , Rfl:    nicotine (NICODERM CQ - DOSED IN MG/24 HOURS) 14 mg/24hr patch, Place 14 mg onto the skin daily., Disp: , Rfl:    OVER THE COUNTER MEDICATION, Take 1 Dose by mouth daily. IRISH SEA MOSS SUPPLEMENT, Disp: , Rfl:    terbinafine (LAMISIL) 250 MG tablet, Take 250 mg by mouth daily., Disp: , Rfl:   Past Medical History: Past Medical History:  Diagnosis Date   Aortic valve regurgitation    COPD (chronic obstructive pulmonary disease) ([place])    Depression    Hepatitis C    Hyperparathyroidism ([place])    Hypertension    Mitral valve regurgitation    Non-toxic multinodular goiter    Pulmonary hypertension ([place]) 2022   noted on CT   Pulmonary nodule    Thyroid disease     Tobacco Use: Social History   Tobacco Use  Smoking Status Every Day   Packs/day: 0.25   Years: 45.00   Total pack years: 11.25   Types: Cigarettes  Smokeless Tobacco Never    Labs: Review Flowsheet  More data exists      Latest Ref Rng & Units 03/29/2021 05/23/2021 05/24/2021 09/23/2022 09/24/2022  Labs for ITP Cardiac and Pulmonary Rehab  Hemoglobin A1c 4.8 - 5.6 % - - 5.9  - 6.1   PH, Arterial 7.35 - 7.45 - 7.47  - - 7.39   PCO2 arterial 32 -  48 mmHg - 31  - - 38   Bicarbonate 20.0 - 28.0 mmol/L 27.9  22.6  - 30.4  23.0   Acid-base deficit 0.0 - 2.0 mmol/L - 0.2  - - 1.7   O2 Saturation % 69.0  99.2  - 94.4  99.8      Pulmonary Assessment Scores:  Pulmonary Assessment Scores     Row Name 09/08/22 1240         ADL UCSD   ADL Phase Entry     SOB Score total 72     Rest 0     Walk 3     Stairs 5     Bath 4     Dress 0     Shop 3       CAT Score   CAT Score 24       mMRC Score   mMRC Score 3              UCSD: Self-administered rating of dyspnea associated with activities of daily living (ADLs) 6-point scale (0 = "not at all" to 5 = "maximal or unable to do because of breathlessness")  Scoring Scores range from 0 to 120.   Minimally important difference is 5 units  CAT: CAT can identify the health impairment of COPD patients and is better correlated with disease progression.  CAT has a scoring range of zero to 40. The CAT score is classified into four groups of low (less than 10), medium (10 - 20), high (21-30) and very high (31-40) based on the impact level of disease on health status. A CAT score over 10 suggests significant symptoms.  A worsening CAT score could be explained by an exacerbation, poor medication adherence, poor inhaler technique, or progression of COPD or comorbid conditions.  CAT MCID is 2 points  mMRC: mMRC (Modified Medical Research Council) Dyspnea Scale is used to assess the degree of baseline functional disability in patients of respiratory disease due to dyspnea. No minimal important difference is established. A decrease in score of 1 point or greater is considered a positive change.   Pulmonary Function Assessment:   Exercise Target Goals: Exercise Program Goal: Individual exercise prescription set using results from initial 6 min walk test and THRR while considering  patient's activity barriers and safety.   Exercise Prescription Goal: Initial exercise prescription builds to 30-45 minutes a day of aerobic activity, 2-3 days per week.  Home exercise guidelines will be given to patient during program as part of exercise prescription that the participant will acknowledge.  Education: Aerobic Exercise: - Group verbal and visual presentation on the components of exercise prescription. Introduces F.I.T.T principle from ACSM for exercise prescriptions.  Reviews F.I.T.T. principles of aerobic exercise including progression. Written material given at graduation.   Education: Resistance Exercise: - Group verbal and visual presentation on the components of exercise prescription. Introduces F.I.T.T principle from ACSM for exercise prescriptions  Reviews F.I.T.T. principles of resistance  exercise including progression. Written material given at graduation.    Education: Exercise & Equipment Safety: - Individual verbal instruction and demonstration of equipment use and safety with use of the equipment. Flowsheet Row Pulmonary Rehab from 09/08/2024 in [facility] Cardiac and Pulmonary Rehab  Date 09/08/22  Educator NT  Instruction Review Code 1- Verbalizes Understanding       Education: Exercise Physiology & General Exercise Guidelines: - Group verbal and written instruction with models to review the exercise physiology of the cardiovascular system and associated critical values. Provides general  exercise guidelines with specific guidelines to those with heart or lung disease.    Education: Flexibility, Balance, Mind/Body Relaxation: - Group verbal and visual presentation with interactive activity on the components of exercise prescription. Introduces F.I.T.T principle from ACSM for exercise prescriptions. Reviews F.I.T.T. principles of flexibility and balance exercise training including progression. Also discusses the mind body connection.  Reviews various relaxation techniques to help reduce and manage stress (i.e. Deep breathing, progressive muscle relaxation, and visualization). Balance handout provided to take home. Written material given at graduation.   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 HR 94 bpm     Max Ex. BP 158/82     2 Minute Post BP 146/76       Interval HR   1 Minute HR 84     2 Minute HR 88     3 Minute HR 91     4 Minute HR 92     5 Minute HR 93     6 Minute HR 94     2 Minute Post HR 75     Interval Heart Rate? Yes       Interval Oxygen   Interval Oxygen? Yes     Baseline Oxygen Saturation % 97 %     1 Minute Oxygen Saturation % 94 %     1 Minute Liters of Oxygen 0 L  RA     2 Minute Oxygen Saturation % 94 %     2 Minute Liters of Oxygen 0 L  RA     3 Minute Oxygen Saturation % 94 %     3 Minute Liters of Oxygen 0 L  RA     4 Minute Oxygen Saturation % 95 %     4 Minute Liters of Oxygen 0 L  RA     5 Minute Oxygen Saturation % 95 %     5 Minute Liters of Oxygen 0 L  RA     6 Minute Oxygen Saturation % 92 %     6 Minute Liters of Oxygen 0 L  RA     2 Minute Post Oxygen Saturation % 98 %     2 Minute Post Liters of Oxygen 0 L  RA             Oxygen Initial Assessment:  Oxygen Initial Assessment - 08/30/22 1023       Home Oxygen   Home Oxygen Device None    Home Exercise Oxygen Prescription None    Home Resting Oxygen Prescription None      Intervention   Short Term Goals To learn and understand importance of maintaining oxygen saturations>88%;To learn and demonstrate proper pursed lip breathing techniques or other breathing techniques. ;To learn and demonstrate proper use of respiratory medications;To learn and understand importance of monitoring SPO2 with pulse oximeter and demonstrate accurate use of the pulse oximeter.    Long  Term Goals Maintenance of O2 saturations>88%;Exhibits proper breathing techniques, such as pursed lip breathing or other method taught during program session;Compliance with respiratory medication;Demonstrates proper use of MDI's             Oxygen Re-Evaluation:  Oxygen  Re-Evaluation     Row Name 09/20/22 1119             Goals/Expected Outcomes   Short Term Goals To learn and understand importance of maintaining oxygen saturations>88%;To learn and demonstrate proper pursed lip breathing techniques or other breathing techniques. ;To learn and demonstrate proper use of respiratory medications;To learn and understand importance of monitoring SPO2 with pulse oximeter and demonstrate accurate use of the pulse oximeter.       Long  Term Goals Maintenance of O2 saturations>88%;Exhibits proper breathing techniques, such as pursed lip breathing or other method taught during program session;Compliance with respiratory medication;Demonstrates proper use of MDI's       Comments Reviewed PLB technique with pt.  Talked about how it works and it's importance in maintaining their exercise saturations.       Goals/Expected Outcomes Short: Become more profiecient at using PLB.   Long: Become independent at using PLB.                Oxygen Discharge (Final Oxygen Re-Evaluation):  Oxygen Re-Evaluation - 09/20/22 1119       Goals/Expected Outcomes   Short Term Goals To learn and understand importance of maintaining oxygen saturations>88%;To learn and demonstrate proper pursed lip breathing techniques or other breathing techniques. ;To learn and demonstrate proper use of respiratory medications;To learn and understand importance of monitoring SPO2 with pulse oximeter and demonstrate accurate use of the pulse oximeter.    Long  Term Goals Maintenance of O2 saturations>88%;Exhibits proper breathing techniques, such as pursed lip breathing or other method taught during program session;Compliance with respiratory medication;Demonstrates proper use of MDI's    Comments Reviewed PLB technique with pt.  Talked about how it works and it's importance in maintaining their exercise saturations.    Goals/Expected Outcomes Short: Become more profiecient at using PLB.   Long: Become  independent at using PLB.             Initial Exercise Prescription:  Initial Exercise Prescription - 09/08/22 1300       Date of Initial Exercise RX and Referring Provider   Date 09/08/22    Referring Provider Bobbye Charleston MD      Oxygen   Maintain Oxygen Saturation 88% or higher      Treadmill   MPH 1.8    Grade 1    Minutes 15    METs 2.63      NuStep   Level 2    SPM 80    Minutes 15    METs 3.21      REL-XR   Level 1    Watts 29  Speed 50    Minutes 15    METs 3.21      T5 Nustep   Level 1    SPM 80    Minutes 15    METs 3.21      Prescription Details   Frequency (times per week) 2    Duration Progress to 30 minutes of continuous aerobic without signs/symptoms of physical distress      Intensity   THRR 40-80% of Max Heartrate 100-138    Ratings of Perceived Exertion 11-13    Perceived Dyspnea 0-4      Progression   Progression Continue to progress workloads to maintain intensity without signs/symptoms of physical distress.      Resistance Training   Training Prescription Yes    Weight 3 lb    Reps 10-15             Perform Capillary Blood Glucose checks as needed.  Exercise Prescription Changes:   Exercise Prescription Changes     Row Name 09/08/22 1400 09/29/22 1600           Response to Exercise   Blood Pressure (Admit) 130/66 138/64      Blood Pressure (Exercise) 158/82 132/74      Blood Pressure (Exit) 146/76 136/62      Heart Rate (Admit) 62 bpm 88 bpm      Heart Rate (Exercise) 94 bpm 110 bpm      Heart Rate (Exit) 75 bpm 87 bpm      Oxygen Saturation (Admit) 97 % 95 %      Oxygen Saturation (Exercise) 92 % 95 %      Oxygen Saturation (Exit) 98 % 95 %      Rating of Perceived Exertion (Exercise) 11 12      Perceived Dyspnea (Exercise) 2 2      Symptoms SOB, right leg fatigue SOB      Comments 6MWT Results 1st full day of exercise      Duration -- Progress to 30 minutes of  aerobic without signs/symptoms of  physical distress      Intensity -- THRR unchanged        Progression   Progression -- Continue to progress workloads to maintain intensity without signs/symptoms of physical distress.      Average METs -- 3.26        Resistance Training   Training Prescription -- Yes      Weight -- 3 lb      Reps -- 10-15        Interval Training   Interval Training -- No        Treadmill   MPH -- 1.8      Grade -- 1      Minutes -- 15      METs -- 2.63        REL-XR   Level -- 1      Minutes -- 15      METs -- 3.9               Exercise Comments:   Exercise Comments     Row Name 09/20/22 1117           Exercise Comments First full day of exercise!  Patient was oriented to gym and equipment including functions, settings, policies, and procedures.  Patient's individual exercise prescription and treatment plan were reviewed.  All starting workloads were established based on the results of the 6 minute walk test done at initial orientation  visit.  The plan for exercise progression was also introduced and progression will be customized based on patient's performance and goals.                Exercise Goals and Review:   Exercise Goals     Row Name 09/08/22 1357             Exercise Goals   Increase Physical Activity Yes       Intervention Provide advice, education, support and counseling about physical activity/exercise needs.;Develop an individualized exercise prescription for aerobic and resistive training based on initial evaluation findings, risk stratification, comorbidities and participant's personal goals.       Expected Outcomes Short Term: Attend rehab on a regular basis to increase amount of physical activity.;Long Term: Add in home exercise to make exercise part of routine and to increase amount of physical activity.;Long Term: Exercising regularly at least 3-5 days a week.       Increase Strength and Stamina Yes       Intervention Provide advice, education,  support and counseling about physical activity/exercise needs.;Develop an individualized exercise prescription for aerobic and resistive training based on initial evaluation findings, risk stratification, comorbidities and participant's personal goals.       Expected Outcomes Short Term: Increase workloads from initial exercise prescription for resistance, speed, and METs.;Short Term: Perform resistance training exercises routinely during rehab and add in resistance training at home;Long Term: Improve cardiorespiratory fitness, muscular endurance and strength as measured by increased METs and functional capacity (6MWT)       Able to understand and use rate of perceived exertion (RPE) scale Yes       Intervention Provide education and explanation on how to use RPE scale       Expected Outcomes Long Term:  Able to use RPE to guide intensity level when exercising independently;Short Term: Able to use RPE daily in rehab to express subjective intensity level       Able to understand and use Dyspnea scale Yes       Intervention Provide education and explanation on how to use Dyspnea scale       Expected Outcomes Short Term: Able to use Dyspnea scale daily in rehab to express subjective sense of shortness of breath during exertion;Long Term: Able to use Dyspnea scale to guide intensity level when exercising independently       Knowledge and understanding of Target Heart Rate Range (THRR) Yes       Intervention Provide education and explanation of THRR including how the numbers were predicted and where they are located for reference       Expected Outcomes Short Term: Able to state/look up THRR;Long Term: Able to use THRR to govern intensity when exercising independently;Short Term: Able to use daily as guideline for intensity in rehab       Able to check pulse independently Yes       Intervention Provide education and demonstration on how to check pulse in carotid and radial arteries.;Review the importance of  being able to check your own pulse for safety during independent exercise       Expected Outcomes Short Term: Able to explain why pulse checking is important during independent exercise;Long Term: Able to check pulse independently and accurately       Understanding of Exercise Prescription Yes       Intervention Provide education, explanation, and written materials on patient's individual exercise prescription       Expected Outcomes Short Term:  Able to explain program exercise prescription;Long Term: Able to explain home exercise prescription to exercise independently                Exercise Goals Re-Evaluation :  Exercise Goals Re-Evaluation     [place] Name 09/20/22 1118 09/29/22 1658           Exercise Goal Re-Evaluation   Exercise Goals Review Increase Physical Activity;Able to understand and use rate of perceived exertion (RPE) scale;Knowledge and understanding of Target Heart Rate Range (THRR);Understanding of Exercise Prescription;Increase Strength and Stamina;Able to understand and use Dyspnea scale;Able to check pulse independently Increase Physical Activity;Increase Strength and Stamina;Understanding of Exercise Prescription      Comments Reviewed RPE scale, THR and program prescription with pt today.  Pt voiced understanding and was given a copy of goals to take home. Draya is doing well for the first session she has completed here at rehab. She was able to exercise at her initial exercise prescription well with appropriate RPEs. Oxygen saturations stayed above 88%. Will continue to monitor.      Expected Outcomes Short: Use RPE daily to regulate intensity.  Long: Follow program prescription in THR. Short: Continue to follow initial exercise prescription Long: Build up overall strength and stamina               Discharge Exercise Prescription (Final Exercise Prescription Changes):  Exercise Prescription Changes - 09/29/22 1600       Response to Exercise   Blood Pressure  (Admit) 138/64    Blood Pressure (Exercise) 132/74    Blood Pressure (Exit) 136/62    Heart Rate (Admit) 88 bpm    Heart Rate (Exercise) 110 bpm    Heart Rate (Exit) 87 bpm    Oxygen Saturation (Admit) 95 %    Oxygen Saturation (Exercise) 95 %    Oxygen Saturation (Exit) 95 %    Rating of Perceived Exertion (Exercise) 12    Perceived Dyspnea (Exercise) 2    Symptoms SOB    Comments 1st full day of exercise    Duration Progress to 30 minutes of  aerobic without signs/symptoms of physical distress    Intensity THRR unchanged      Progression   Progression Continue to progress workloads to maintain intensity without signs/symptoms of physical distress.    Average METs 3.26      Resistance Training   Training Prescription Yes    Weight 3 lb    Reps 10-15      Interval Training   Interval Training No      Treadmill   MPH 1.8    Grade 1    Minutes 15    METs 2.63      REL-XR   Level 1    Minutes 15    METs 3.9             Nutrition:  Target Goals: Understanding of nutrition guidelines, daily intake of sodium '1500mg'$ , cholesterol '200mg'$ , calories 30% from fat and 7% or less from saturated fats, daily to have 5 or more servings of fruits and vegetables.  Education: All About Nutrition: -Group instruction provided by verbal, written material, interactive activities, discussions, models, and posters to present general guidelines for heart healthy nutrition including fat, fiber, MyPlate, the role of sodium in heart healthy nutrition, utilization of the nutrition label, and utilization of this knowledge for meal planning. Follow up email sent as well. Written material given at graduation.   Biometrics:  Pre Biometrics - 09/08/22 1358  Pre Biometrics   Height '5\' 10"'$  (1.778 m)    Weight 165 lb 4.8 oz (75 kg)    Waist Circumference 36.5 inches    Hip Circumference 41 inches    Waist to Hip Ratio 0.89 %    BMI (Calculated) 23.72    Single Leg Stand 3.5 seconds   R              Nutrition Therapy Plan and Nutrition Goals:  Nutrition Therapy & Goals - 09/13/22 1308       Nutrition Therapy   Diet Heart healthy, low Na    Protein (specify units) 80-90g    Fiber 25 grams    Whole Grain Foods 3 servings    Saturated Fats 12 max. grams    Fruits and Vegetables 8 servings/day    Sodium 1.5 grams      Personal Nutrition Goals   Nutrition Goal ST: practice MyPlate guidelines, limit sugar sweetened beverages LT: limit Na <1.5g/day, include at least 25g fiber/day    Comments 62 y.o. F admitted to pulmonary rehab for COPD. PMHx includes HTN, tobacco use, depression. Relevant medications includes prednisone, hydrocodone, tums as needed, levaquin, furosemide PRN, sea moss, Calcium and vit D3 prescription - she has not been taking them as regularly. PYP Score: 42. Vegetables & Fruits 6/12. Breads, Grains & Cereals 8/12. Red & Processed Meat 6/12. Poultry 2/2. Fish & Shellfish 0/4. Beans, Nuts & Seeds 1/4. Milk & Dairy Foods 4/6. Toppings, Oils, Seasonings & Salt 6/20. Sweets, Snacks & Restaurant Food 3/14. Beverages 6/10. Suggested Suda speak with her MD to ensure that taking sea moss would be ok for her as it is high in iodine. Elnore reports eating a lot of cheese and yogurt, she is trying to stay away from red meat, but she is getting tired of chicken. She will have cheese toast 2x/day (white and whole wheat) as well as fresh fruit and vegetables during the day. She likes bacon and breakfast sausages. She likes to eat when she gets hungry. Drinks: watermelon juice, sprite, gingerale, apple juice, regular gatorade, spring water. Discussed general heart healthy eating and pulmonary MNT.      Intervention Plan   Intervention Prescribe, educate and counsel regarding individualized specific dietary modifications aiming towards targeted core components such as weight, hypertension, lipid management, diabetes, heart failure and other comorbidities.    Expected Outcomes  Short Term Goal: Understand basic principles of dietary content, such as calories, fat, sodium, cholesterol and nutrients.;Short Term Goal: A plan has been developed with personal nutrition goals set during dietitian appointment.;Long Term Goal: Adherence to prescribed nutrition plan.             Nutrition Assessments:  MEDIFICTS Score Key: ?70 Need to make dietary changes  40-70 Heart Healthy Diet ? 40 Therapeutic Level Cholesterol Diet  Flowsheet Row Pulmonary Rehab from 09/08/2024 in [facility] Cardiac and Pulmonary Rehab  Picture Your Plate Total Score on Admission 42      Picture Your Plate Scores: <42 Unhealthy dietary pattern with much room for improvement. 41-50 Dietary pattern unlikely to meet recommendations for good health and room for improvement. 51-60 More healthful dietary pattern, with some room for improvement.  >60 Healthy dietary pattern, although there may be some specific behaviors that could be improved.   Nutrition Goals Re-Evaluation:   Nutrition Goals Discharge (Final Nutrition Goals Re-Evaluation):   Psychosocial: Target Goals: Acknowledge presence or absence of significant depression and/or stress, maximize coping skills, provide positive support system. Participant is  able to verbalize types and ability to use techniques and skills needed for reducing stress and depression.   Education: Stress, Anxiety, and Depression - Group verbal and visual presentation to define topics covered.  Reviews how body is impacted by stress, anxiety, and depression.  Also discusses healthy ways to reduce stress and to treat/manage anxiety and depression.  Written material given at graduation.   Education: Sleep Hygiene -Provides group verbal and written instruction about how sleep can affect your health.  Define sleep hygiene, discuss sleep cycles and impact of sleep habits. Review good sleep hygiene tips.    Initial Review & Psychosocial Screening:  Initial Psych Review  & Screening - 08/30/22 1023       Initial Review   Current issues with None Identified      Family Dynamics   Good Support System? Yes   2 sons live with her     Barriers   Psychosocial barriers to participate in program There are no identifiable barriers or psychosocial needs.      Screening Interventions   Interventions Encouraged to exercise;To provide support and resources with identified psychosocial needs;Provide feedback about the scores to participant    Expected Outcomes Short Term goal: Utilizing psychosocial counselor, staff and physician to assist with identification of specific Stressors or current issues interfering with healing process. Setting desired goal for each stressor or current issue identified.;Long Term Goal: Stressors or current issues are controlled or eliminated.;Short Term goal: Identification and review with participant of any Quality of Life or Depression concerns found by scoring the questionnaire.;Long Term goal: The participant improves quality of Life and PHQ9 Scores as seen by post scores and/or verbalization of changes             Quality of Life Scores:  Scores of 19 and below usually indicate a poorer quality of life in these areas.  A difference of  2-3 points is a clinically meaningful difference.  A difference of 2-3 points in the total score of the Quality of Life Index has been associated with significant improvement in overall quality of life, self-image, physical symptoms, and general health in studies assessing change in quality of life.  PHQ-9: Review Flowsheet       09/08/2022  Depression screen PHQ 2/9  Decreased Interest 2  Down, Depressed, Hopeless 3  PHQ - 2 Score 5  Altered sleeping 2  Tired, decreased energy 3  Change in appetite 2  Feeling bad or failure about yourself  0  Trouble concentrating 0  Moving slowly or fidgety/restless 0  Suicidal thoughts 0  PHQ-9 Score 12  Difficult doing work/chores Somewhat difficult    Interpretation of Total Score  Total Score Depression Severity:  1-4 = Minimal depression, 5-9 = Mild depression, 10-14 = Moderate depression, 15-19 = Moderately severe depression, 20-27 = Severe depression   Psychosocial Evaluation and Intervention:  Psychosocial Evaluation - 08/30/22 1040       Psychosocial Evaluation & Interventions   Comments Zury has no barriers to attending the program. She lives with her 2 grown sons. They are her support. She wants to improve her COPD symptoms. She is still smoking about 2 cigarettes a day. She was given info on the [place] Quitline and she ahs access to any meds from her [place] caregiver.  She will schedule for 2 days a week ,secondary to transportation needs.    Expected Outcomes STG Matsue is able to attend her scheduled sessions. She is able to set a Quit date  and complete her tobacco cessation. She is able to see progression with her exercise and a decrease in her symptoms.    Continue Psychosocial Services  Follow up required by staff             Psychosocial Re-Evaluation:   Psychosocial Discharge (Final Psychosocial Re-Evaluation):   Education: Education Goals: Education classes will be provided on a weekly basis, covering required topics. Participant will state understanding/return demonstration of topics presented.  Learning Barriers/Preferences:   General Pulmonary Education Topics:  Infection Prevention: - Provides verbal and written material to individual with discussion of infection control including proper hand washing and proper equipment cleaning during exercise session. Flowsheet Row Pulmonary Rehab from 09/08/2024 in [facility] Card[facility] and Pulmonary Rehab  Date 09/08/22  Educator NT  Instruction Review Code 1- Verbalizes Understanding       Falls Prevention: - Provides verbal and written material to individual with discussion of falls prevention and safety. Flowsheet Row Pulmonary Rehab from 09/08/2024 in [facility] Cardiac and  Pulmonary Rehab  Date 08/30/22  Educator SB  Instruction Review Code 1- Verbalizes Understanding       Chronic Lung Disease Review: - Group verbal instruction with posters, models, PowerPoint presentations and videos,  to review new updates, new respiratory medications, new advancements in procedures and treatments. Providing information on websites and "800" numbers for continued self-education. Includes information about supplement oxygen, available portable oxygen systems, continuous and intermittent flow rates, oxygen safety, concentrators, and Medicare reimbursement for oxygen. Explanation of Pulmonary Drugs, including class, frequency, complications, importance of spacers, rinsing mouth after steroid MDI's, and proper cleaning methods for nebulizers. Review of basic lung anatomy and physiology related to function, structure, and complications of lung disease. Review of risk factors. Discussion about methods for diagnosing sleep apnea and types of masks and machines for OSA. Includes a review of the use of types of environmental controls: home humidity, furnaces, filters, dust mite/pet prevention, HEPA vacuums. Discussion about weather changes, air quality and the benefits of nasal washing. Instruction on Warning signs, infection symptoms, calling MD promptly, preventive modes, and value of vaccinations. Review of effective airway clearance, coughing and/or vibration techniques. Emphasizing that all should Create an Action Plan. Written material given at graduation.   AED/CPR: - Group verbal and written instruction with the use of models to demonstrate the basic use of the AED with the basic ABC's of resuscitation.    Anatomy and Cardiac Procedures: - Group verbal and visual presentation and models provide information about basic cardiac anatomy and function. Reviews the testing methods done to diagnose heart disease and the outcomes of the test results. Describes the treatment choices:  Medical Management, Angioplasty, or Coronary Bypass Surgery for treating various heart conditions including Myocardial Infarction, Angina, Valve Disease, and Cardiac Arrhythmias.  Written material given at graduation.   Medication Safety: - Group verbal and visual instruction to review commonly prescribed medications for heart and lung disease. Reviews the medication, class of the drug, and side effects. Includes the steps to properly store meds and maintain the prescription regimen.  Written material given at graduation.   Other: -Provides group and verbal instruction on various topics (see comments)   Knowledge Questionnaire Score:  Knowledge Questionnaire Score - 09/08/22 1239       Knowledge Questionnaire Score   Pre Score 16/18              Core Components/Risk Factors/Patient Goals at Admission:  Personal Goals and Risk Factors at Admission - 09/08/22 1239  Core Components/Risk Factors/Patient Goals on Admission    Weight Management Yes    Intervention Weight Management: Develop a combined nutrition and exercise program designed to reach desired caloric intake, while maintaining appropriate intake of nutrient and fiber, sodium and fats, and appropriate energy expenditure required for the weight goal.;Weight Management: Provide education and appropriate resources to help participant work on and attain dietary goals.;Weight Management/Obesity: Establish reasonable short term and long term weight goals.    Admit Weight 165 lb 4.8 oz (75 kg)    Goal Weight: Short Term 160 lb (72.6 kg)    Goal Weight: Long Term 150 lb (68 kg)    Expected Outcomes Short Term: Continue to assess and modify interventions until short term weight is achieved;Long Term: Adherence to nutrition and physical activity/exercise program aimed toward attainment of established weight goal;Weight Loss: Understanding of general recommendations for a balanced deficit meal plan, which promotes 1-2 lb weight  loss per week and includes a negative energy balance of [phone] kcal/d    Tobacco Cessation Yes    Number of packs per day Nobuko is a current tobacco user. Intervention for tobacco cessation was provided at the initial medical review. She was asked about readiness to quit and reported she wants to quit and is down to 2 cigarettes a day.  . Patient was advised and educated about tobacco cessation using combination therapy, tobacco cessation classes, quit line, and quit smoking apps. Patient demonstrated understanding of this material. Staff will continue to provide encouragement and follow up with the patient throughout the program.    Intervention Assist the participant in steps to quit. Provide individualized education and counseling about committing to Tobacco Cessation, relapse prevention, and pharmacological support that can be provided by physician.;Advice worker, assist with locating and accessing local/national Quit Smoking programs, and support quit date choice.    Expected Outcomes Short Term: Will demonstrate readiness to quit, by selecting a quit date.;Short Term: Will quit all tobacco product use, adhering to prevention of relapse plan.;Long Term: Complete abstinence from all tobacco products for at least 12 months from quit date.    Improve shortness of breath with ADL's Yes    Intervention Provide education, individualized exercise plan and daily activity instruction to help decrease symptoms of SOB with activities of daily living.    Expected Outcomes Short Term: Improve cardiorespiratory fitness to achieve a reduction of symptoms when performing ADLs;Long Term: Be able to perform more ADLs without symptoms or delay the onset of symptoms    Increase knowledge of respiratory medications and ability to use respiratory devices properly  Yes    Intervention Provide education and demonstration as needed of appropriate use of medications, inhalers, and oxygen therapy.    Expected  Outcomes Short Term: Achieves understanding of medications use. Understands that oxygen is a medication prescribed by physician. Demonstrates appropriate use of inhaler and oxygen therapy.;Long Term: Maintain appropriate use of medications, inhalers, and oxygen therapy.    Hypertension Yes    Intervention Provide education on lifestyle modifcations including regular physical activity/exercise, weight management, moderate sodium restriction and increased consumption of fresh fruit, vegetables, and low fat dairy, alcohol moderation, and smoking cessation.;Monitor prescription use compliance.    Expected Outcomes Short Term: Continued assessment and intervention until BP is < 140/42m HG in hypertensive participants. < 130/858mHG in hypertensive participants with diabetes, heart failure or chronic kidney disease.;Long Term: Maintenance of blood pressure at goal levels.  Education:Diabetes - Individual verbal and written instruction to review signs/symptoms of diabetes, desired ranges of glucose level fasting, after meals and with exercise. Acknowledge that pre and post exercise glucose checks will be done for 3 sessions at entry of program.   Know Your Numbers and Heart Failure: - Group verbal and visual instruction to discuss disease risk factors for cardiac and pulmonary disease and treatment options.  Reviews associated critical values for Overweight/Obesity, Hypertension, Cholesterol, and Diabetes.  Discusses basics of heart failure: signs/symptoms and treatments.  Introduces Heart Failure Zone chart for action plan for heart failure.  Written material given at graduation.   Core Components/Risk Factors/Patient Goals Review:    Core Components/Risk Factors/Patient Goals at Discharge (Final Review):    ITP Comments:  ITP Comments     Row Name 08/30/22 1046 09/08/22 1233 09/13/22 1348 09/20/22 1117 09/28/22 1009   ITP Comments Virtual orientation call completed today. shehas  an appointment on Date: 09/08/2022  for EP eval and gym Orientation.  Documentation of diagnosis can be found in Media Tab Date: 12/7 .   Graysen is a current tobacco user. Intervention for tobacco cessation was provided at the initial medical review. She was asked about readiness to quit and reported she wants to quit and is down to 2 cigarettes a day.  . Patient was advised and educated about tobacco cessation using combination therapy, tobacco cessation classes, quit line, and quit smoking apps. Patient demonstrated understanding of this material. Staff will continue to provide encouragement and follow up with the patient throughout the program. Completed 6MWT and gym orientation. Initial ITP created and sent for review to Dr. Ottie Glazier, Medical Director. Completed initial RD consultation First full day of exercise!  Patient was oriented to gym and equipment including functions, settings, policies, and procedures.  Patient's individual exercise prescription and treatment plan were reviewed.  All starting workloads were established based on the results of the 6 minute walk test done at initial orientation visit.  The plan for exercise progression was also introduced and progression will be customized based on patient's performance and goals. 30 Day review completed. Medical Director ITP review done, changes made as directed, and signed approval by Medical Director.     new to program    Row Name 10/04/22 1125           ITP Comments Patient called to let us know that she did not feel like she could complete the program and requested to discharge. Will discharge at this time.                Comments: Discharge ITP

## 2022-10-04 NOTE — Progress Notes (Signed)
Discharge Progress Report  Patient Details  Name: Cindy Gutierrez MRN: [id number] Date of Birth: 1960-12-22 Referring Provider:   April Manson Pulmonary Rehab from 09/08/2022 in [facility] Cardiac and Pulmonary Rehab  Referring Provider Bobbye Charleston MD        Number of Visits: 2  Reason for Discharge:  Early Exit:  Personal  Smoking History:  Social History   Tobacco Use  Smoking Status Every Day   Packs/day: 0.25   Years: 45.00   Total pack years: 11.25   Types: Cigarettes  Smokeless Tobacco Never    Diagnosis:  Chronic obstructive pulmonary disease, unspecified COPD type ([place])  Dyspnea, unspecified type   Initial Exercise Prescription:  Initial Exercise Prescription - 09/08/22 1300       Date of Initial Exercise RX and Referring Provider   Date 09/08/22    Referring Provider Bobbye Charleston MD      Oxygen   Maintain Oxygen Saturation 88% or higher      Treadmill   MPH 1.8    Grade 1    Minutes 15    METs 2.63      NuStep   Level 2    SPM 80    Minutes 15    METs 3.21      REL-XR   Level 1    Watts 29    Speed 50    Minutes 15    METs 3.21      T5 Nustep   Level 1    SPM 80    Minutes 15    METs 3.21      Prescription Details   Frequency (times per week) 2    Duration Progress to 30 minutes of continuous aerobic without signs/symptoms of physical distress      Intensity   THRR 40-80% of Max Heartrate 100-138    Ratings of Perceived Exertion 11-13    Perceived Dyspnea 0-4      Progression   Progression Continue to progress workloads to maintain intensity without signs/symptoms of physical distress.      Resistance Training   Training Prescription Yes    Weight 3 lb    Reps 10-15             Discharge Exercise Prescription (Final Exercise Prescription Changes):  Exercise Prescription Changes - 09/29/22 1600       Response to Exercise   Blood Pressure (Admit) 138/64    Blood Pressure (Exercise) 132/74    Blood Pressure (Exit)  136/62    Heart Rate (Admit) 88 bpm    Heart Rate (Exercise) 110 bpm    Heart Rate (Exit) 87 bpm    Oxygen Saturation (Admit) 95 %    Oxygen Saturation (Exercise) 95 %    Oxygen Saturation (Exit) 95 %    Rating of Perceived Exertion (Exercise) 12    Perceived Dyspnea (Exercise) 2    Symptoms SOB    Comments 1st full day of exercise    Duration Progress to 30 minutes of  aerobic without signs/symptoms of physical distress    Intensity THRR unchanged      Progression   Progression Continue to progress workloads to maintain intensity without signs/symptoms of physical distress.    Average METs 3.26      Resistance Training   Training Prescription Yes    Weight 3 lb    Reps 10-15      Interval Training   Interval Training No      Treadmill   MPH  1.8    Grade 1    Minutes 15    METs 2.63      REL-XR   Level 1    Minutes 15    METs 3.9             Functional Capacity:  6 Minute Walk     Row Name 09/08/22 1351         6 Minute Walk   Phase Initial     Distance 990 feet     Walk Time 6 minutes     # of Rest Breaks 0     MPH 1.88     METS 3.21     RPE 11     Perceived Dyspnea  2     VO2 Peak 11.25     Symptoms Yes (comment)     Comments SOB, right leg fatigue     Resting HR 62 bpm     Resting BP 130/66     Resting Oxygen Saturation  97 %     Exercise Oxygen Saturation  during 6 min walk 92 %     Max Ex. HR 94 bpm     Max Ex. BP 158/82     2 Minute Post BP 146/76       Interval HR   1 Minute HR 84     2 Minute HR 88     3 Minute HR 91     4 Minute HR 92     5 Minute HR 93     6 Minute HR 94     2 Minute Post HR 75     Interval Heart Rate? Yes       Interval Oxygen   Interval Oxygen? Yes     Baseline Oxygen Saturation % 97 %     1 Minute Oxygen Saturation % 94 %     1 Minute Liters of Oxygen 0 L  RA     2 Minute Oxygen Saturation % 94 %     2 Minute Liters of Oxygen 0 L  RA     3 Minute Oxygen Saturation % 94 %     3 Minute Liters of Oxygen  0 L  RA     4 Minute Oxygen Saturation % 95 %     4 Minute Liters of Oxygen 0 L  RA     5 Minute Oxygen Saturation % 95 %     5 Minute Liters of Oxygen 0 L  RA     6 Minute Oxygen Saturation % 92 %     6 Minute Liters of Oxygen 0 L  RA     2 Minute Post Oxygen Saturation % 98 %     2 Minute Post Liters of Oxygen 0 L  RA               Nutrition & Weight - Outcomes:  Pre Biometrics - 09/08/22 1358       Pre Biometrics   Height '5\' 10"'$  (1.778 m)    Weight 165 lb 4.8 oz (75 kg)    Waist Circumference 36.5 inches    Hip Circumference 41 inches    Waist to Hip Ratio 0.89 %    BMI (Calculated) 23.72    Single Leg Stand 3.5 seconds   R

## 2022-10-06 ENCOUNTER — Ambulatory Visit: Payer: No Typology Code available for payment source

## 2022-10-11 ENCOUNTER — Ambulatory Visit: Payer: No Typology Code available for payment source

## 2022-10-13 ENCOUNTER — Ambulatory Visit: Payer: No Typology Code available for payment source

## 2022-10-18 ENCOUNTER — Ambulatory Visit: Payer: No Typology Code available for payment source

## 2022-10-20 ENCOUNTER — Ambulatory Visit: Payer: No Typology Code available for payment source

## 2022-10-25 ENCOUNTER — Ambulatory Visit: Payer: No Typology Code available for payment source

## 2022-10-27 ENCOUNTER — Ambulatory Visit: Payer: No Typology Code available for payment source

## 2022-11-01 ENCOUNTER — Ambulatory Visit: Payer: No Typology Code available for payment source

## 2022-11-03 ENCOUNTER — Ambulatory Visit: Payer: No Typology Code available for payment source

## 2022-11-08 ENCOUNTER — Ambulatory Visit: Payer: No Typology Code available for payment source

## 2022-11-10 ENCOUNTER — Ambulatory Visit: Payer: No Typology Code available for payment source

## 2022-11-15 ENCOUNTER — Ambulatory Visit: Payer: No Typology Code available for payment source

## 2022-11-17 ENCOUNTER — Ambulatory Visit: Payer: No Typology Code available for payment source

## 2022-11-22 ENCOUNTER — Ambulatory Visit: Payer: No Typology Code available for payment source

## 2022-11-24 ENCOUNTER — Ambulatory Visit: Payer: No Typology Code available for payment source

## 2022-11-29 ENCOUNTER — Ambulatory Visit: Payer: No Typology Code available for payment source

## 2022-12-01 ENCOUNTER — Ambulatory Visit: Payer: No Typology Code available for payment source

## 2022-12-06 ENCOUNTER — Ambulatory Visit: Payer: No Typology Code available for payment source

## 2022-12-08 ENCOUNTER — Ambulatory Visit: Payer: No Typology Code available for payment source

## 2022-12-13 ENCOUNTER — Ambulatory Visit: Payer: No Typology Code available for payment source

## 2022-12-15 ENCOUNTER — Ambulatory Visit: Payer: No Typology Code available for payment source

## 2022-12-20 ENCOUNTER — Ambulatory Visit: Payer: No Typology Code available for payment source

## 2022-12-22 ENCOUNTER — Ambulatory Visit: Payer: No Typology Code available for payment source

## 2022-12-27 ENCOUNTER — Ambulatory Visit: Payer: No Typology Code available for payment source

## 2022-12-29 ENCOUNTER — Ambulatory Visit: Payer: No Typology Code available for payment source

## 2023-01-03 ENCOUNTER — Ambulatory Visit: Payer: No Typology Code available for payment source

## 2023-01-05 ENCOUNTER — Ambulatory Visit: Payer: No Typology Code available for payment source

## 2023-03-20 ENCOUNTER — Ambulatory Visit: Payer: No Typology Code available for payment source | Admitting: Family Medicine

## 2023-06-19 ENCOUNTER — Emergency Department: Payer: No Typology Code available for payment source

## 2023-06-19 ENCOUNTER — Emergency Department
Admission: EM | Admit: 2023-06-19 | Discharge: 2023-06-19 | Disposition: A | Discharge status: Home / Self Care | Payer: No Typology Code available for payment source | Attending: Emergency Medicine | Admitting: Emergency Medicine

## 2023-06-19 ENCOUNTER — Other Ambulatory Visit: Payer: Self-pay

## 2023-06-19 DIAGNOSIS — R600 Localized edema: Secondary | ICD-10-CM | POA: Diagnosis not present

## 2023-06-19 DIAGNOSIS — I10 Essential (primary) hypertension: Secondary | ICD-10-CM | POA: Diagnosis not present

## 2023-06-19 DIAGNOSIS — Z20822 Contact with and (suspected) exposure to covid-19: Secondary | ICD-10-CM | POA: Insufficient documentation

## 2023-06-19 DIAGNOSIS — R7989 Other specified abnormal findings of blood chemistry: Secondary | ICD-10-CM | POA: Diagnosis not present

## 2023-06-19 DIAGNOSIS — J441 Chronic obstructive pulmonary disease with (acute) exacerbation: Secondary | ICD-10-CM | POA: Insufficient documentation

## 2023-06-19 DIAGNOSIS — R0602 Shortness of breath: Secondary | ICD-10-CM | POA: Diagnosis present

## 2023-06-19 LAB — CBC WITH DIFFERENTIAL/PLATELET
Abs Immature Granulocytes: 0.05 10*3/uL (ref 0.00–0.07)
Basophils Absolute: 0 10*3/uL (ref 0.0–0.1)
Basophils Relative: 0 %
Eosinophils Absolute: 0 10*3/uL (ref 0.0–0.5)
Eosinophils Relative: 0 %
HCT: 39.9 % (ref 36.0–46.0)
Hemoglobin: 12.2 g/dL (ref 12.0–15.0)
Immature Granulocytes: 1 %
Lymphocytes Relative: 5 %
Lymphs Abs: 0.5 10*3/uL — ABNORMAL LOW (ref 0.7–4.0)
MCH: 27.1 pg (ref 26.0–34.0)
MCHC: 30.6 g/dL (ref 30.0–36.0)
MCV: 88.5 fL (ref 80.0–100.0)
Monocytes Absolute: 0.1 10*3/uL (ref 0.1–1.0)
Monocytes Relative: 1 %
Neutro Abs: 9.1 10*3/uL — ABNORMAL HIGH (ref 1.7–7.7)
Neutrophils Relative %: 93 %
Platelets: 276 10*3/uL (ref 150–400)
RBC: 4.51 MIL/uL (ref 3.87–5.11)
RDW: 12.6 % (ref 11.5–15.5)
WBC: 9.7 10*3/uL (ref 4.0–10.5)
nRBC: 0 % (ref 0.0–0.2)

## 2023-06-19 LAB — COMPREHENSIVE METABOLIC PANEL
ALT: 15 U/L (ref 0–44)
AST: 18 U/L (ref 15–41)
Albumin: 3.9 g/dL (ref 3.5–5.0)
Alkaline Phosphatase: 116 U/L (ref 38–126)
Anion gap: 10 (ref 5–15)
BUN: 16 mg/dL (ref 8–23)
CO2: 27 mmol/L (ref 22–32)
Calcium: 9 mg/dL (ref 8.9–10.3)
Chloride: 105 mmol/L (ref 98–111)
Creatinine, Ser: 0.78 mg/dL (ref 0.44–1.00)
GFR, Estimated: >60 mL/min (ref >60)
Glucose, Bld: 137 mg/dL — ABNORMAL HIGH (ref 70–99)
Potassium: 4.4 mmol/L (ref 3.5–5.1)
Sodium: 142 mmol/L (ref 135–145)
Total Bilirubin: 0.5 mg/dL (ref 0.3–1.2)
Total Protein: 7.5 g/dL (ref 6.5–8.1)

## 2023-06-19 LAB — SARS CORONAVIRUS 2 BY RT PCR: SARS Coronavirus 2 by RT PCR: NEGATIVE

## 2023-06-19 LAB — TROPONIN I (HIGH SENSITIVITY): Troponin I (High Sensitivity): 12 ng/L (ref <18)

## 2023-06-19 LAB — BRAIN NATRIURETIC PEPTIDE: B Natriuretic Peptide: 479.7 pg/mL — ABNORMAL HIGH (ref 0.0–100.0)

## 2023-06-19 MED ORDER — PREDNISONE 20 MG PO TABS: 60.0000 mg | ORAL_TABLET | Freq: Every day | ORAL | 0 refills | Status: AC

## 2023-06-19 MED ORDER — ALBUTEROL SULFATE (2.5 MG/3ML) 0.083% IN NEBU
2.5000 mg | INHALATION_SOLUTION | Freq: Once | RESPIRATORY_TRACT | Status: AC
  Administered 2023-06-19: 2.5 mg via RESPIRATORY_TRACT
  Filled 2023-06-19: qty 3

## 2023-06-19 NOTE — ED Notes (Addendum)
Lab called at this time to add on troponin to previous collection. Lab confirmed they would add on.

## 2023-06-19 NOTE — ED Triage Notes (Signed)
First Nurse Note: Patient to ED via [facility] from home for SOB x3 days. Pt reports to be tripoding upon EMS arrival. 85% on RA- placed on 4L [place] (wears 2L at home PRN). Given 2 duonebs, 125mg  solumedrol, and 4g of Magnesium.   EMS VS:  96% 4L [place] 159/70 93 HR

## 2023-06-19 NOTE — Discharge Instructions (Addendum)
Is your albuterol every 4-6 hours for the next several days.  Take the prednisone for 4 days starting tomorrow.  Take all of your other normal medications as prescribed.  Follow-up with your primary care provider within the next week if possible.  Return to the ER for new, worsening, or persistent severe shortness of breath, chest pain, cough, fever, vomiting, weakness, or any other new or worsening symptoms that concern you.

## 2023-06-19 NOTE — ED Triage Notes (Signed)
See first nurse note r/t medications given by EMS. Pt presents to the ED via [facility] from home. Pt has hx of COPD. Pt is on 4L oxygen via [place] with an oxygen saturation of 97%. Pt reports SHOB x4 days. Denies pain.

## 2023-06-19 NOTE — ED Provider Notes (Signed)
[facility] Provider Note    Event Date/Time   First MD Initiated Contact with Patient 06/19/23 1538     (approximate)   History   Shortness of Breath   HPI  Cindy DONK is a 62 y.o. female with a history of COPD, hepatitis C, hypertension, pulmonary hypertension, hyperparathyroidism, nontoxic multinodular goiter, and depression who presents with increased shortness of breath over the last 4 days, gradual onset, associated with nonproductive cough.  The patient states normally she is on 2 L of O2 but has had to turn up to 4 L.  She reports some intermittent episodes of chest pain over the last day but is not having any currently.  She denies any fever or chills.  She has no vomiting or diarrhea.  She took all of her home COPD medications without relief.  I the past medical records.  The patient's most recent inpatient admission was in December 2023 after presenting with shortness of breath and hypoxia.  She was found to have sepsis due to influenza A infection and had a COPD exacerbation.   Physical Exam   Triage Vital Signs: ED Triage Vitals  Encounter Vitals Group     BP 06/19/23 1410 (!) 168/60     Systolic BP Percentile --      Diastolic BP Percentile --      Pulse Rate 06/19/23 1410 82     Resp 06/19/23 1410 18     Temp 06/19/23 1410 99.3 F (37.4 C)     Temp Source 06/19/23 1410 Oral     SpO2 06/19/23 1410 97 %     Weight 06/19/23 1411 180 lb (81.6 kg)     Height 06/19/23 1411 5\' 9"  (1.753 m)     Head Circumference --      Peak Flow --      Pain Score 06/19/23 1411 0     Pain Loc --      Pain Education --      Exclude from Growth Chart --     Most recent vital signs: Vitals:   06/19/23 1800 06/19/23 1822  BP: (!) 153/55   Pulse: 78   Resp: 17   Temp:  98.3 F (36.8 C)  SpO2: 98%      General: Awake, no distress.  CV:  Good peripheral perfusion.  Resp:  Slightly increased effort.  Scattered wheezing bilaterally. Abd:  No  distention.  Other:  Trace bilateral lower extremity edema.   ED Results / Procedures / Treatments   Labs (all labs ordered are listed, but only abnormal results are displayed) Labs Reviewed  CBC WITH DIFFERENTIAL/PLATELET - Abnormal; Notable for the following components:      Result Value   Neutro Abs 9.1 (*)    Lymphs Abs 0.5 (*)    All other components within normal limits  COMPREHENSIVE METABOLIC PANEL - Abnormal; Notable for the following components:   Glucose, Bld 137 (*)    All other components within normal limits  BRAIN NATRIURETIC PEPTIDE - Abnormal; Notable for the following components:   B Natriuretic Peptide 479.7 (*)    All other components within normal limits  SARS CORONAVIRUS 2 BY RT PCR  TROPONIN I (HIGH SENSITIVITY)     EKG  ED ECG REPORT I, Dionne Bucy, the attending physician, personally viewed and interpreted this ECG.  Date: 06/19/2023 EKG Time: 1412 Rate: 84 Rhythm: normal sinus rhythm QRS Axis: Left axis Intervals: normal ST/T Wave abnormalities: normal Narrative Interpretation: no  evidence of acute ischemia    RADIOLOGY  Chest x-ray: I independently viewed and interpreted the images; there is no focal consolidation or edema  PROCEDURES:  Critical Care performed: No  Procedures   MEDICATIONS ORDERED IN ED: Medications  albuterol (PROVENTIL) (2.5 MG/3ML) 0.083% nebulizer solution 2.5 mg (2.5 mg Nebulization Given 06/19/23 1557)  albuterol (PROVENTIL) (2.5 MG/3ML) 0.083% nebulizer solution 2.5 mg (2.5 mg Nebulization Given 06/19/23 1557)     IMPRESSION / MDM / ASSESSMENT AND PLAN / ED COURSE  I reviewed the triage vital signs and the nursing notes.  62 year old female with PMH as noted above presents with worsening shortness of breath over the last 4 days associated with nonproductive cough and increased oxygen requirement.  Currently on 4 L O2 her saturation is in the high 90s.  She has a borderline elevated temperature,  hypertension, but otherwise normal vital signs.  Lung exam reveals wheezing bilaterally with the patient currently speaking in full sentences and appears relatively comfortable.  Differential diagnosis includes, but is not limited to, COPD exacerbation, acute bronchitis, COVID or other viral syndrome, bacterial pneumonia, less likely cardiac etiology.  Chest x-ray shows no acute findings, favoring COPD exacerbation.  DuoNebs, Solu-Medrol, and magnesium were already given by EMS with significant improvement in the patient's clinical condition.  We will obtain labs, sternal bronchodilators, and reassess.  Patient's presentation is most consistent with acute presentation with potential threat to life or bodily function.  The patient is on the cardiac monitor to evaluate for evidence of arrhythmia and/or significant heart rate changes.  ----------------------------------------- 6:27 PM on 06/19/2023 -----------------------------------------  The patient reports that she is feeling significantly better.  I did consider whether she may benefit from inpatient admission given that she was requiring a higher rate of oxygen initially and given her COPD history and comorbidities.  However, the patient is now back down to her baseline 2 L with an O2 saturation in the mid to high 90s.  She does not demonstrate any increased work of breathing and is speaking full sentences.  She has a strong preference to go home which I feel is reasonable.  Lab workup is unremarkable.  COVID is negative.  BNP is minimally elevated.  Troponin is normal.  Given the duration of the symptoms there is no indication for a repeat.  Chest x-ray shows no acute findings.  I counseled the patient on the results of the workup.  I prescribed a course of prednisone.  She will continue her normal albuterol and other medications.  I gave strict return precautions and she expressed understanding.  FINAL CLINICAL IMPRESSION(S) / ED DIAGNOSES    Final diagnoses:  COPD exacerbation (HCC)     Rx / DC Orders   ED Discharge Orders          Ordered    predniSONE (DELTASONE) 20 MG tablet  Daily with breakfast        06/19/23 1826             Note:  This document was prepared using Dragon voice recognition software and may include unintentional dictation errors.    Dionne Bucy, MD 06/19/23 [phone]

## 2023-09-18 ENCOUNTER — Observation Stay
Admission: EM | Admit: 2023-09-18 | Discharge: 2023-09-21 | Disposition: A | Discharge status: Home / Self Care | Payer: No Typology Code available for payment source | Attending: Internal Medicine | Admitting: Internal Medicine

## 2023-09-18 ENCOUNTER — Encounter: Payer: Self-pay | Admitting: Emergency Medicine

## 2023-09-18 ENCOUNTER — Emergency Department: Payer: No Typology Code available for payment source

## 2023-09-18 DIAGNOSIS — R0603 Acute respiratory distress: Secondary | ICD-10-CM | POA: Diagnosis present

## 2023-09-18 DIAGNOSIS — G9009 Other idiopathic peripheral autonomic neuropathy: Secondary | ICD-10-CM | POA: Diagnosis not present

## 2023-09-18 DIAGNOSIS — J9601 Acute respiratory failure with hypoxia: Secondary | ICD-10-CM | POA: Diagnosis not present

## 2023-09-18 DIAGNOSIS — Z1152 Encounter for screening for COVID-19: Secondary | ICD-10-CM | POA: Insufficient documentation

## 2023-09-18 DIAGNOSIS — Z720 Tobacco use: Secondary | ICD-10-CM | POA: Diagnosis present

## 2023-09-18 DIAGNOSIS — E785 Hyperlipidemia, unspecified: Secondary | ICD-10-CM

## 2023-09-18 DIAGNOSIS — F17210 Nicotine dependence, cigarettes, uncomplicated: Secondary | ICD-10-CM | POA: Diagnosis not present

## 2023-09-18 DIAGNOSIS — J441 Chronic obstructive pulmonary disease with (acute) exacerbation: Secondary | ICD-10-CM | POA: Diagnosis not present

## 2023-09-18 DIAGNOSIS — Z7982 Long term (current) use of aspirin: Secondary | ICD-10-CM | POA: Diagnosis not present

## 2023-09-18 DIAGNOSIS — I10 Essential (primary) hypertension: Secondary | ICD-10-CM | POA: Diagnosis not present

## 2023-09-18 DIAGNOSIS — R778 Other specified abnormalities of plasma proteins: Secondary | ICD-10-CM | POA: Diagnosis not present

## 2023-09-18 DIAGNOSIS — G629 Polyneuropathy, unspecified: Secondary | ICD-10-CM

## 2023-09-18 DIAGNOSIS — E876 Hypokalemia: Secondary | ICD-10-CM | POA: Diagnosis not present

## 2023-09-18 DIAGNOSIS — Z79899 Other long term (current) drug therapy: Secondary | ICD-10-CM | POA: Diagnosis not present

## 2023-09-18 LAB — BASIC METABOLIC PANEL
Anion gap: 8 (ref 5–15)
BUN: 11 mg/dL (ref 8–23)
CO2: 28 mmol/L (ref 22–32)
Calcium: 8.3 mg/dL — ABNORMAL LOW (ref 8.9–10.3)
Chloride: 107 mmol/L (ref 98–111)
Creatinine, Ser: 0.89 mg/dL (ref 0.44–1.00)
GFR, Estimated: >60 mL/min (ref >60)
Glucose, Bld: 121 mg/dL — ABNORMAL HIGH (ref 70–99)
Potassium: 3.4 mmol/L — ABNORMAL LOW (ref 3.5–5.1)
Sodium: 143 mmol/L (ref 135–145)

## 2023-09-18 LAB — CBC
HCT: 38.3 % (ref 36.0–46.0)
Hemoglobin: 12 g/dL (ref 12.0–15.0)
MCH: 26.8 pg (ref 26.0–34.0)
MCHC: 31.3 g/dL (ref 30.0–36.0)
MCV: 85.5 fL (ref 80.0–100.0)
Platelets: 318 10*3/uL (ref 150–400)
RBC: 4.48 MIL/uL (ref 3.87–5.11)
RDW: 13.9 % (ref 11.5–15.5)
WBC: 8.7 10*3/uL (ref 4.0–10.5)
nRBC: 0 % (ref 0.0–0.2)

## 2023-09-18 LAB — SARS CORONAVIRUS 2 BY RT PCR: SARS Coronavirus 2 by RT PCR: NEGATIVE

## 2023-09-18 LAB — CBG MONITORING, ED: Glucose-Capillary: 134 mg/dL — ABNORMAL HIGH (ref 70–99)

## 2023-09-18 LAB — TROPONIN I (HIGH SENSITIVITY): Troponin I (High Sensitivity): 18 ng/L — ABNORMAL HIGH (ref <18)

## 2023-09-18 MED ORDER — CALCIUM CARBONATE ANTACID 500 MG PO CHEW
1.0000 | CHEWABLE_TABLET | Freq: Every day | ORAL | Status: DC
  Administered 2023-09-19 – 2023-09-21 (×2): 200 mg via ORAL
  Filled 2023-09-18 (×3): qty 1

## 2023-09-18 MED ORDER — GABAPENTIN 300 MG PO CAPS
300.0000 mg | ORAL_CAPSULE | Freq: Every day | ORAL | Status: DC
  Filled 2023-09-18: qty 1

## 2023-09-18 MED ORDER — METHYLPREDNISOLONE SODIUM SUCC 40 MG IJ SOLR
40.0000 mg | Freq: Two times a day (BID) | INTRAMUSCULAR | Status: AC
  Administered 2023-09-19 (×2): 40 mg via INTRAVENOUS
  Filled 2023-09-18 (×2): qty 1

## 2023-09-18 MED ORDER — SODIUM CHLORIDE 0.9 % IV SOLN: INTRAVENOUS | Status: DC

## 2023-09-18 MED ORDER — IPRATROPIUM-ALBUTEROL 0.5-2.5 (3) MG/3ML IN SOLN
3.0000 mL | Freq: Four times a day (QID) | RESPIRATORY_TRACT | Status: DC
  Administered 2023-09-19 (×2): 3 mL via RESPIRATORY_TRACT
  Filled 2023-09-18 (×4): qty 3

## 2023-09-18 MED ORDER — VITAMIN D 25 MCG (1000 UNIT) PO TABS
3000.0000 [IU] | ORAL_TABLET | Freq: Every day | ORAL | Status: DC
  Administered 2023-09-19 – 2023-09-21 (×3): 3000 [IU] via ORAL
  Filled 2023-09-18 (×3): qty 3

## 2023-09-18 MED ORDER — ASPIRIN 81 MG PO CHEW
81.0000 mg | CHEWABLE_TABLET | Freq: Every day | ORAL | Status: DC
  Administered 2023-09-19 – 2023-09-21 (×3): 81 mg via ORAL
  Filled 2023-09-18 (×3): qty 1

## 2023-09-18 MED ORDER — CHLORTHALIDONE 25 MG PO TABS
12.5000 mg | ORAL_TABLET | Freq: Every day | ORAL | Status: DC
  Administered 2023-09-19 – 2023-09-21 (×3): 12.5 mg via ORAL
  Filled 2023-09-18 (×3): qty 0.5

## 2023-09-18 MED ORDER — ACETAMINOPHEN 650 MG RE SUPP: 650.0000 mg | Freq: Four times a day (QID) | RECTAL | Status: DC | PRN

## 2023-09-18 MED ORDER — GUAIFENESIN 100 MG/5ML PO LIQD
5.0000 mL | ORAL | Status: DC | PRN
  Administered 2023-09-19 – 2023-09-20 (×4): 5 mL via ORAL
  Filled 2023-09-18 (×5): qty 10

## 2023-09-18 MED ORDER — ONDANSETRON HCL 4 MG/2ML IJ SOLN: 4.0000 mg | Freq: Four times a day (QID) | INTRAMUSCULAR | Status: DC | PRN

## 2023-09-18 MED ORDER — PREDNISONE 20 MG PO TABS
40.0000 mg | ORAL_TABLET | Freq: Every day | ORAL | Status: DC
  Administered 2023-09-20 – 2023-09-21 (×2): 40 mg via ORAL
  Filled 2023-09-18 (×2): qty 2

## 2023-09-18 MED ORDER — SODIUM CHLORIDE 0.9 % IV SOLN
1.0000 g | INTRAVENOUS | Status: DC
  Administered 2023-09-19: 1 g via INTRAVENOUS
  Filled 2023-09-18: qty 10

## 2023-09-18 MED ORDER — ONDANSETRON HCL 4 MG PO TABS: 4.0000 mg | ORAL_TABLET | Freq: Four times a day (QID) | ORAL | Status: DC | PRN

## 2023-09-18 MED ORDER — NICOTINE 14 MG/24HR TD PT24
14.0000 mg | MEDICATED_PATCH | Freq: Every day | TRANSDERMAL | Status: DC
  Filled 2023-09-18 (×3): qty 1

## 2023-09-18 MED ORDER — GLECAPREVIR-PIBRENTASVIR 100-40 MG PO TABS: 3.0000 | ORAL_TABLET | Freq: Every day | ORAL | Status: DC

## 2023-09-18 MED ORDER — TRAZODONE HCL 50 MG PO TABS: 25.0000 mg | ORAL_TABLET | Freq: Every evening | ORAL | Status: DC | PRN

## 2023-09-18 MED ORDER — ACETAMINOPHEN 325 MG PO TABS
650.0000 mg | ORAL_TABLET | Freq: Four times a day (QID) | ORAL | Status: DC | PRN
  Administered 2023-09-20 – 2023-09-21 (×2): 650 mg via ORAL
  Filled 2023-09-18 (×2): qty 2

## 2023-09-18 MED ORDER — AMLODIPINE BESYLATE 10 MG PO TABS
10.0000 mg | ORAL_TABLET | Freq: Every day | ORAL | Status: DC
  Administered 2023-09-19 – 2023-09-21 (×3): 10 mg via ORAL
  Filled 2023-09-18: qty 2
  Filled 2023-09-18 (×2): qty 1

## 2023-09-18 MED ORDER — MAGNESIUM HYDROXIDE 400 MG/5ML PO SUSP: 30.0000 mL | Freq: Every day | ORAL | Status: DC | PRN

## 2023-09-18 MED ORDER — ENOXAPARIN SODIUM 40 MG/0.4ML IJ SOSY
40.0000 mg | PREFILLED_SYRINGE | INTRAMUSCULAR | Status: DC
  Filled 2023-09-18 (×3): qty 0.4

## 2023-09-18 NOTE — Assessment & Plan Note (Signed)
-   We will continue anti-hypertensive therapy. 

## 2023-09-18 NOTE — Assessment & Plan Note (Addendum)
-   The patient has subsequent acute respiratory failure with hypoxia. - The patient will be admitted to a medically monitored bed. - We will place the patient IV steroid therapy with IV Solu-Medrol as well as nebulized bronchodilator therapy with duonebs q.i.d. and q.4 hours p.r.n.Marland Kitchen - Mucolytic therapy will be provided with Mucinex and antibiotic therapy with IV Rocephin. - O2 protocol will be followed.

## 2023-09-18 NOTE — Assessment & Plan Note (Signed)
-   We will continue Lyrica. 

## 2023-09-18 NOTE — Assessment & Plan Note (Signed)
-  We will replace potassium and check magnesium level. 

## 2023-09-18 NOTE — H&P (Addendum)
[place]   PATIENT NAME: Cindy Gutierrez    MR#:  [id number]  DATE OF BIRTH:  1960-10-22  DATE OF ADMISSION:  09/18/2023  PRIMARY CARE PHYSICIAN: [facility]   Patient is coming from: Home  REQUESTING/REFERRING PHYSICIAN: Willy Eddy, MD  CHIEF COMPLAINT:   Chief Complaint  Patient presents with   Respiratory Distress    HISTORY OF PRESENT ILLNESS:  Cindy Gutierrez is a 62 y.o. female with medical history significant for COPD, depression, hepatitis C, essential hypertension, and tobacco abuse, who presented to the emergency room with acute onset of worsening dyspnea with associated congested cough with inability to expectorate and wheezing since Saturday which was associated with URI symptoms including rhinorrhea and nasal congestion.  No fever or chills.  She was noted to be hypoxic by EMS with diminished breath sounds throughout.  She was given multiple nebulized bronchodilator therapy, IV Solu-Medrol and IV magnesium sulfate.  No chest pain or palpitations.  No nausea or vomiting or abdominal pain.  No dysuria, oliguria or hematuria or flank pain.  No bleeding diathesis.  ED Course: When the patient came to the ER, BP was 171/98 with respiratory rate of 23 and pulse oximetry was 100% on 6 L of O2 by nasal cannula.  Labs revealed mild hypokalemia 3.4 and calcium of 8.3 with a blood glucose of 121.  High sensitive troponin I was 18 and CBC was normal.  COVID-19 PCR came back negative. EKG as reviewed by me : EKG showed normal sinus rhythm with a rate of 87 with prolonged PR interval, probable left atrial enlargement and LVH with anterior Q waves. Imaging: Portable chest x-ray showed no acute cardiopulmonary disease.  The patient will be admitted to a medical telemetry observation bed for further evaluation and management. PAST MEDICAL HISTORY:   Past Medical History:  Diagnosis Date   Aortic valve regurgitation    COPD (chronic obstructive pulmonary  disease) (HCC)    Depression    Hepatitis C    Hyperparathyroidism (HCC)    Hypertension    Mitral valve regurgitation    Non-toxic multinodular goiter    Pulmonary hypertension (HCC) 2022   noted on CT   Pulmonary nodule    Thyroid disease     PAST SURGICAL HISTORY:   Past Surgical History:  Procedure Laterality Date   COLONOSCOPY WITH PROPOFOL N/A 08/21/2018   Procedure: COLONOSCOPY WITH PROPOFOL;  Surgeon: Toney Reil, MD;  Location: [facility] ENDOSCOPY;  Service: Gastroenterology;  Laterality: N/A;   PARATHYROIDECTOMY Left    TUBAL LIGATION      SOCIAL HISTORY:   Social History   Tobacco Use   Smoking status: Every Day    Current packs/day: 0.25    Average packs/day: 0.3 packs/day for 45.0 years (11.3 ttl pk-yrs)    Types: Cigarettes   Smokeless tobacco: Never  Substance Use Topics   Alcohol use: Not Currently    FAMILY HISTORY:   Family History  Problem Relation Age of Onset   Alzheimer's disease Mother    Heart disease Father     DRUG ALLERGIES:   Allergies  Allergen Reactions   Sulfa Antibiotics Hives and Rash   Bee Pollen Hives   Lisinopril Nausea Only   Losartan    Penicillins Hives   Tiotropium Bromide Monohydrate     Other reaction(s): Cough   Carvedilol     REVIEW OF SYSTEMS:   ROS As per history of present illness. All pertinent systems were reviewed above.  Constitutional, HEENT, cardiovascular, respiratory, GI, GU, musculoskeletal, neuro, psychiatric, endocrine, integumentary and hematologic systems were reviewed and are otherwise negative/unremarkable except for positive findings mentioned above in the HPI.   MEDICATIONS AT HOME:   Prior to Admission medications   Medication Sig Start Date End Date Taking? Authorizing Provider  albuterol (PROVENTIL HFA;VENTOLIN HFA) 108 (90 Base) MCG/ACT inhaler Inhale 1-2 puffs into the lungs every 6 (six) hours as needed for wheezing or shortness of breath. 06/26/16   Hagler, Jami L, PA-C   albuterol (PROVENTIL) (2.5 MG/3ML) 0.083% nebulizer solution Take 2.5 mg by nebulization every 6 (six) hours as needed for wheezing or shortness of breath.    [provider]  amLODipine (NORVASC) 10 MG tablet Take 10 mg by mouth daily.    [provider]  aspirin 81 MG chewable tablet Chew 81 mg by mouth daily. 04/13/22   [provider]  budesonide-formoterol (SYMBICORT) 160-4.5 MCG/ACT inhaler Inhale 2 puffs into the lungs 2 (two) times daily.    [provider]  calcium carbonate (TUMS - DOSED IN MG ELEMENTAL CALCIUM) 500 MG chewable tablet Chew 1 tablet by mouth daily. As needed    [provider]  chlorthalidone (HYGROTON) 25 MG tablet Take 12.5 mg by mouth daily.    [provider]  Cholecalciferol 25 MCG (1000 UT) tablet Take 3,000 Units by mouth daily. 06/22/22   [provider]  diclofenac Sodium (VOLTAREN) 1 % GEL Apply 2 g topically 4 (four) times daily. 09/21/22   [provider]  gabapentin (NEURONTIN) 300 MG capsule Take 300 mg by mouth at bedtime. 09/21/22   [provider]  Glecaprevir-Pibrentasvir (MAVYRET) 100-40 MG TABS Take 3 tablets by mouth daily.    [provider]  guaiFENesin (ROBITUSSIN) 100 MG/5ML liquid Take 5 mLs by mouth every 4 (four) hours as needed for cough or to loosen phlegm.    [provider]  nicotine (NICODERM CQ - DOSED IN MG/24 HOURS) 14 mg/24hr patch Place 14 mg onto the skin daily. 04/13/22   [provider]  OVER THE COUNTER MEDICATION Take 1 Dose by mouth daily. IRISH SEA MOSS SUPPLEMENT    [provider]  terbinafine (LAMISIL) 250 MG tablet Take 250 mg by mouth daily. 09/28/21   [provider]      VITAL SIGNS:  Blood pressure (!) 141/52, pulse 66, resp. rate 18, height 5\' 9"  (1.753 m), weight 87.6 kg, SpO2 100%.  PHYSICAL EXAMINATION:  Physical Exam  GENERAL:  62 y.o.-year-old female patient lying in the bed with no  acute distress.  EYES: Pupils equal, round, reactive to light and accommodation. No scleral icterus. Extraocular muscles intact.  HEENT: Head atraumatic, normocephalic. Oropharynx and nasopharynx clear.  NECK:  Supple, no jugular venous distention. No thyroid enlargement, no tenderness.  LUNGS: Diffuse expiratory wheezes with tight expiratory airflow and harsh vesicular breathing.  No use of accessory muscles of respiration.  CARDIOVASCULAR: Regular rate and rhythm, S1, S2 normal. No murmurs, rubs, or gallops.  ABDOMEN: Soft, nondistended, nontender. Bowel sounds present. No organomegaly or mass.  EXTREMITIES: No pedal edema, cyanosis, or clubbing.  NEUROLOGIC: Cranial nerves II through XII are intact. Muscle strength 5/5 in all extremities. Sensation intact. Gait not checked.  PSYCHIATRIC: The patient is alert and oriented x 3.  Normal affect and good eye contact. SKIN: Right leg contusion otherwise no obvious rash, lesion, or ulcer.   LABORATORY PANEL:   CBC Recent Labs  Lab 09/18/23 2138  WBC 8.7  HGB  12.0  HCT 38.3  PLT 318   ------------------------------------------------------------------------------------------------------------------  Chemistries  Recent Labs  Lab 09/18/23 2138  NA 143  K 3.4*  CL 107  CO2 28  GLUCOSE 121*  BUN 11  CREATININE 0.89  CALCIUM 8.3*   ------------------------------------------------------------------------------------------------------------------  Cardiac Enzymes No results for input(s): "TROPONINI" in the last 168 hours. ------------------------------------------------------------------------------------------------------------------  RADIOLOGY:  DG Chest Port 1 View Result Date: 09/18/2023 CLINICAL DATA:  Shortness of breath for 3 days EXAM: PORTABLE CHEST 1 VIEW COMPARISON:  06/19/2023 FINDINGS: Stable cardiomediastinal silhouette. Hyperinflation and chronic bronchitic change. No focal consolidation, pleural effusion, or  pneumothorax. No displaced rib fractures. IMPRESSION: No acute process.  Emphysema. Electronically Signed   By: Minerva Fester M.D.   On: 09/18/2023 22:47      IMPRESSION AND PLAN:  Assessment and Plan: * COPD exacerbation (HCC) - The patient has subsequent acute respiratory failure with hypoxia. - The patient will be admitted to a medically monitored bed. - We will place the patient IV steroid therapy with IV Solu-Medrol as well as nebulized bronchodilator therapy with duonebs q.i.d. and q.4 hours p.r.n.Marland Kitchen - Mucolytic therapy will be provided with Mucinex and antibiotic therapy with IV Rocephin. - O2 protocol will be followed.   Hypokalemia - We will replace potassium and check magnesium level.  Tobacco use - She was counseled for smoking cessation and will receive further counseling here.  Essential hypertension - We will continue antihypertensive therapy.  Dyslipidemia - We will continue statin therapy.  Peripheral neuropathy - We will continue Lyrica.       DVT prophylaxis: Lovenox.  Advanced Care Planning:  Code Status: full code.  Family Communication:  The plan of care was discussed in details with the patient (and family). I answered all questions. The patient agreed to proceed with the above mentioned plan. Further management will depend upon hospital course. Disposition Plan: Back to previous home environment Consults called: none.  All the records are reviewed and case discussed with ED provider.  Status is: Observation  I certify that at the time of admission, it is my clinical judgment that the patient will require hospital care extending less than 2 midnights.                            Dispo: The patient is from: Home              Anticipated d/c is to: Home              Patient currently is not medically stable to d/c.              Difficult to place patient: No  Hannah Beat M.D on 09/19/2023 at 4:01 AM  [facility]   From 7 PM-7 AM,  contact night-coverage [email or web address]  CC: Primary care physician; Administration, [employer]

## 2023-09-18 NOTE — Assessment & Plan Note (Signed)
-   We will continue statin therapy. 

## 2023-09-18 NOTE — ED Provider Notes (Signed)
[facility] Provider Note    Event Date/Time   First MD Initiated Contact with Patient 09/18/23 2112     (approximate)   History   Respiratory Distress   HPI  Cindy Gutierrez is a 62 y.o. female who presents to the ER and moderate respiratory distress.  EMS was called due to to shortness of breath patient found to be hypoxic diminished breath sounds throughout.  Has longstanding history of COPD previously on oxygen but not currently on home oxygen.  Was given multiple nebulizer treatments IV Solu-Medrol IV mag.  Patient does feel like she is improving clinically.  She denies any chest pain has had some URI symptoms.  No recent antibiotics.     Physical Exam   Triage Vital Signs: ED Triage Vitals  Encounter Vitals Group     BP 09/18/23 2110 (!) 171/98     Systolic BP Percentile --      Diastolic BP Percentile --      Pulse Rate 09/18/23 2110 84     Resp 09/18/23 2110 (!) 23     Temp --      Temp src --      SpO2 09/18/23 2104 (!) 88 %     Weight 09/18/23 2108 193 lb 2 oz (87.6 kg)     Height 09/18/23 2108 5\' 9"  (1.753 m)     Head Circumference --      Peak Flow --      Pain Score 09/18/23 2108 0     Pain Loc --      Pain Education --      Exclude from Growth Chart --     Most recent vital signs: Vitals:   09/18/23 2130 09/18/23 2200  BP: (!) 182/60 (!) 160/62  Pulse: 74 70  Resp: 15 15  SpO2: 100% 100%     Constitutional: Alert  Eyes: Conjunctivae are normal.  Head: Atraumatic. Nose: No congestion/rhinnorhea. Mouth/Throat: Mucous membranes are moist.   Neck: Painless ROM.  Cardiovascular:   Good peripheral circulation. Respiratory: Tachypnea Gastrointestinal: Soft and nontender.  Musculoskeletal:  no deformity Neurologic:  MAE spontaneously. No gross focal neurologic deficits are appreciated.  Skin:  Skin is warm, dry and intact. No rash noted. Psychiatric: Mood and affect are normal. Speech and behavior are normal.    ED  Results / Procedures / Treatments   Labs (all labs ordered are listed, but only abnormal results are displayed) Labs Reviewed  BASIC METABOLIC PANEL - Abnormal; Notable for the following components:      Result Value   Potassium 3.4 (*)    Glucose, Bld 121 (*)    Calcium 8.3 (*)    All other components within normal limits  CBG MONITORING, ED - Abnormal; Notable for the following components:   Glucose-Capillary 134 (*)    All other components within normal limits  TROPONIN I (HIGH SENSITIVITY) - Abnormal; Notable for the following components:   Troponin I (High Sensitivity) 18 (*)    All other components within normal limits  SARS CORONAVIRUS 2 BY RT PCR  CBC     EKG  ED ECG REPORT I, Willy Eddy, the attending physician, personally viewed and interpreted this ECG.   Date: 09/18/2023  EKG Time: 21:07  Rate: 90  Rhythm: sinus  Axis: normal  Intervals: prolonged qt  ST&T Change: no stemi    RADIOLOGY  Please see ED Course for my review and interpretation.  I personally reviewed all radiographic images ordered  to evaluate for the above acute complaints and reviewed radiology reports and findings.  These findings were personally discussed with the patient.  Please see medical record for radiology report.    PROCEDURES:  Critical Care performed: yes   .Critical Care  Performed by: Willy Eddy, MD Authorized by: Willy Eddy, MD   Critical care provider statement:    Critical care time (minutes):  40   Critical care was necessary to treat or prevent imminent or life-threatening deterioration of the following conditions:  Respiratory failure   Critical care was time spent personally by me on the following activities:  Ordering and performing treatments and interventions, ordering and review of laboratory studies, ordering and review of radiographic studies, pulse oximetry, re-evaluation of patient's condition, review of old charts, obtaining history  from patient or surrogate, examination of patient, evaluation of patient's response to treatment, discussions with primary provider, discussions with consultants and development of treatment plan with patient or surrogate    MEDICATIONS ORDERED IN ED: Medications - No data to display   IMPRESSION / MDM / ASSESSMENT AND PLAN / ED COURSE  I reviewed the triage vital signs and the nursing notes.                              Differential diagnosis includes, but is not limited to, Asthma, copd, CHF, pna, ptx, malignancy, Pe, anemia  Patient presenting to the ER for evaluation of symptoms as described above.  Based on symptoms, risk factors and considered above differential, this presenting complaint could reflect a potentially life-threatening illness therefore the patient will be placed on continuous pulse oximetry and telemetry for monitoring.  Laboratory evaluation will be sent to evaluate for the above complaints.      Clinical Course as of 09/18/23 2227  Mon Sep 18, 2023  2138 Chest x-ray on my review and interpretation without evidence of pneumothorax consolidation.  She does appear to be clinically improving now that the steroid and DuoNebs have had more time to take effect. [PR]  2154 White count is normal. [PR]  2226 Mild bone troponin likely secondary to demand ischemia in the setting of hypoxia.  COVID test negative.  Does not seem clinically consistent with PE or CHF.  Given her clinical improvement does appear stable and appropriate for admission to the hospital.  Will consult hospitalist for admission. [PR]    Clinical Course User Index [PR] Willy Eddy, MD     FINAL CLINICAL IMPRESSION(S) / ED DIAGNOSES   Final diagnoses:  Acute respiratory failure with hypoxia (HCC)  COPD exacerbation (HCC)     Rx / DC Orders   ED Discharge Orders     None        Note:  This document was prepared using Dragon voice recognition software and may include unintentional  dictation errors.    Willy Eddy, MD 09/18/23 2227

## 2023-09-18 NOTE — Assessment & Plan Note (Signed)
She was counseled for smoking cessation and will receive further counseling here. 

## 2023-09-18 NOTE — Progress Notes (Signed)
Responded to room for respiratory distress for this patient. Patient on svn given by ems.  Bipap on sb. No interventions needed by RT when seen per provider.

## 2023-09-18 NOTE — ED Triage Notes (Signed)
Pt arrived via [facility] from home in respiratory distress with report by FD of initial O2 saturation at 88% RA. Pt received 2mg  IV Mag, 125mg  IV Solumedrol, and 3 Duo-neb treatments in route. Per EMS, pt with absent lung sound initially and arrived diminished in all fields. Pt able to speak in complete sentences on arrival with oxygen delivery by 6L nasal cannula and partial non-rebreather/nebulizer.   Pt reports hx/o CPOD with no at home use of O2. Recent URI with worsening symptoms x1 day.   Respiratory at bedside and EDP called.

## 2023-09-19 ENCOUNTER — Other Ambulatory Visit: Payer: Self-pay

## 2023-09-19 DIAGNOSIS — I10 Essential (primary) hypertension: Secondary | ICD-10-CM | POA: Diagnosis not present

## 2023-09-19 DIAGNOSIS — E876 Hypokalemia: Secondary | ICD-10-CM | POA: Diagnosis not present

## 2023-09-19 DIAGNOSIS — J441 Chronic obstructive pulmonary disease with (acute) exacerbation: Secondary | ICD-10-CM | POA: Diagnosis not present

## 2023-09-19 LAB — BASIC METABOLIC PANEL
Anion gap: 11 (ref 5–15)
BUN: 10 mg/dL (ref 8–23)
CO2: 23 mmol/L (ref 22–32)
Calcium: 8.3 mg/dL — ABNORMAL LOW (ref 8.9–10.3)
Chloride: 106 mmol/L (ref 98–111)
Creatinine, Ser: 0.69 mg/dL (ref 0.44–1.00)
GFR, Estimated: >60 mL/min (ref >60)
Glucose, Bld: 214 mg/dL — ABNORMAL HIGH (ref 70–99)
Potassium: 3.6 mmol/L (ref 3.5–5.1)
Sodium: 140 mmol/L (ref 135–145)

## 2023-09-19 LAB — CBC
HCT: 37 % (ref 36.0–46.0)
Hemoglobin: 11.8 g/dL — ABNORMAL LOW (ref 12.0–15.0)
MCH: 26.9 pg (ref 26.0–34.0)
MCHC: 31.9 g/dL (ref 30.0–36.0)
MCV: 84.5 fL (ref 80.0–100.0)
Platelets: 292 10*3/uL (ref 150–400)
RBC: 4.38 MIL/uL (ref 3.87–5.11)
RDW: 13.9 % (ref 11.5–15.5)
WBC: 8.7 10*3/uL (ref 4.0–10.5)
nRBC: 0 % (ref 0.0–0.2)

## 2023-09-19 LAB — HIV ANTIBODY (ROUTINE TESTING W REFLEX): HIV Screen 4th Generation wRfx: NONREACTIVE

## 2023-09-19 LAB — TROPONIN I (HIGH SENSITIVITY): Troponin I (High Sensitivity): 90 ng/L — ABNORMAL HIGH (ref <18)

## 2023-09-19 MED ORDER — ALBUTEROL SULFATE HFA 108 (90 BASE) MCG/ACT IN AERS: 2.0000 | INHALATION_SPRAY | RESPIRATORY_TRACT | Status: DC | PRN

## 2023-09-19 MED ORDER — ALBUTEROL SULFATE (2.5 MG/3ML) 0.083% IN NEBU: 2.5000 mg | INHALATION_SOLUTION | RESPIRATORY_TRACT | Status: DC | PRN

## 2023-09-19 MED ORDER — PREGABALIN 75 MG PO CAPS
150.0000 mg | ORAL_CAPSULE | Freq: Two times a day (BID) | ORAL | Status: DC
Start: 2023-09-19 — End: 2023-09-21
  Administered 2023-09-19 – 2023-09-21 (×5): 150 mg via ORAL
  Filled 2023-09-19 (×5): qty 2

## 2023-09-19 MED ORDER — CELECOXIB 100 MG PO CAPS
100.0000 mg | ORAL_CAPSULE | Freq: Two times a day (BID) | ORAL | Status: DC
  Administered 2023-09-19 – 2023-09-21 (×4): 100 mg via ORAL
  Filled 2023-09-19 (×5): qty 1

## 2023-09-19 MED ORDER — ALBUTEROL SULFATE HFA 108 (90 BASE) MCG/ACT IN AERS
2.0000 | INHALATION_SPRAY | RESPIRATORY_TRACT | Status: DC | PRN
  Administered 2023-09-20 – 2023-09-21 (×4): 2 via RESPIRATORY_TRACT
  Filled 2023-09-19: qty 6.7

## 2023-09-19 MED ORDER — ALBUTEROL SULFATE (2.5 MG/3ML) 0.083% IN NEBU
INHALATION_SOLUTION | RESPIRATORY_TRACT | Status: AC
  Administered 2023-09-19: 2.5 mg
  Filled 2023-09-19: qty 3

## 2023-09-19 MED ORDER — PREGABALIN 75 MG PO CAPS: 150.0000 mg | ORAL_CAPSULE | Freq: Two times a day (BID) | ORAL | Status: DC

## 2023-09-19 NOTE — Progress Notes (Signed)
  Progress Note   Patient: Cindy Gutierrez DOB: 10/09/1960 DOA: 09/18/2023     0 DOS: the patient was seen and examined on 09/19/2023   Brief hospital course: Cindy Gutierrez is a 62 y.o. female with medical history significant for COPD, depression, hepatitis C, essential hypertension, and tobacco abuse, who presented to the emergency room with acute onset of worsening dyspnea with associated congested cough with inability to expectorate and wheezing since Saturday which was associated with URI symptoms including rhinorrhea and nasal congestion.  Patient also had hypoxia, was placed on 6 L oxygen admission.  She is also placed on steroids for COPD exacerbation.   Principal Problem:   COPD exacerbation (HCC) Active Problems:   Hypokalemia   Essential hypertension   Tobacco use   Dyslipidemia   Peripheral neuropathy   Assessment and Plan: * COPD exacerbation (HCC) acute respiratory failure with hypoxia. Patient is treated with oxygen, IV steroids followed by oral.  No evidence of bacterial infection, I will discontinue antibiotics. Continue scheduled bronchodilator. Patient most likely can be discharged home tomorrow.  Hypokalemia Potassium normalized.  Tobacco use - She was counseled for smoking cessation and will receive further counseling here.  Essential hypertension Continue home medicines  Dyslipidemia - We will continue statin therapy.  Peripheral neuropathy - We will continue Lyrica.       Subjective:  Patient still has significant short of breath with minimal exertion, on 2 to 3 L oxygen.  Cough, nonproductive.  Physical Exam: Vitals:   09/19/23 0906 09/19/23 0930 09/19/23 1000 09/19/23 1030  BP: (!) 197/79 (!) 173/74 (!) 186/78 (!) 168/74  Pulse: 77 65 64 62  Resp: (!) 29 (!) 23 17 (!) 21  Temp:      TempSrc:      SpO2: 99% 99% 98% 100%  Weight:      Height:       General exam: Appears calm and comfortable  Respiratory system: Decreased  breathing sounds. Respiratory effort normal. Cardiovascular system: S1 & S2 heard, RRR. No JVD, murmurs, rubs, gallops or clicks. No pedal edema. Gastrointestinal system: Abdomen is nondistended, soft and nontender. No organomegaly or masses felt. Normal bowel sounds heard. Central nervous system: Alert and oriented. No focal neurological deficits. Extremities: Symmetric 5 x 5 power. Skin: No rashes, lesions or ulcers Psychiatry: Judgement and insight appear normal. Mood & affect appropriate.    Data Reviewed:  Chest x-ray and lab results reviewed.  Family Communication: None  Disposition: Status is: Observation      Time spent: 35 minutes  Author: Marrion Coy, MD 09/19/2023 12:34 PM  For on call review [email or web address].

## 2023-09-19 NOTE — ED Notes (Addendum)
Randye Lobo (floor coverage APP) text paged via Am[employer] at [phone] to alert troponin increased from 18ng/L to 90ng/L . Confirmed this provider aware of result at 0645.

## 2023-09-19 NOTE — ED Notes (Signed)
Ambulated to BR on RA.  DOE noted.  Improves with rest.

## 2023-09-19 NOTE — Hospital Course (Addendum)
Cindy Gutierrez is a 62 y.o. female with medical history significant for COPD, depression, hepatitis C, essential hypertension, and tobacco abuse, who presented to the emergency room with acute onset of worsening dyspnea with associated congested cough with inability to expectorate and wheezing since Saturday which was associated with URI symptoms including rhinorrhea and nasal congestion.  Patient also had hypoxia, was placed on 6 L oxygen admission.  She is also placed on steroids for COPD exacerbation.

## 2023-09-20 ENCOUNTER — Observation Stay
Admit: 2023-09-20 | Discharge: 2023-09-20 | Disposition: A | Discharge status: Home / Self Care | Payer: No Typology Code available for payment source | Attending: Internal Medicine | Admitting: Internal Medicine

## 2023-09-20 ENCOUNTER — Observation Stay: Payer: No Typology Code available for payment source

## 2023-09-20 DIAGNOSIS — J441 Chronic obstructive pulmonary disease with (acute) exacerbation: Secondary | ICD-10-CM | POA: Diagnosis not present

## 2023-09-20 LAB — BRAIN NATRIURETIC PEPTIDE: B Natriuretic Peptide: 460.4 pg/mL — ABNORMAL HIGH (ref 0.0–100.0)

## 2023-09-20 LAB — TROPONIN I (HIGH SENSITIVITY)
Troponin I (High Sensitivity): 35 ng/L — ABNORMAL HIGH (ref <18)
Troponin I (High Sensitivity): 37 ng/L — ABNORMAL HIGH (ref <18)

## 2023-09-20 MED ORDER — ALBUTEROL SULFATE (2.5 MG/3ML) 0.083% IN NEBU
2.5000 mg | INHALATION_SOLUTION | Freq: Four times a day (QID) | RESPIRATORY_TRACT | Status: AC
  Administered 2023-09-20 – 2023-09-21 (×3): 2.5 mg via RESPIRATORY_TRACT
  Filled 2023-09-20 (×3): qty 3

## 2023-09-20 MED ORDER — MAGNESIUM SULFATE 2 GM/50ML IV SOLN
2.0000 g | Freq: Once | INTRAVENOUS | Status: AC
  Administered 2023-09-20: 2 g via INTRAVENOUS
  Filled 2023-09-20: qty 50

## 2023-09-20 MED ORDER — ALPRAZOLAM 0.25 MG PO TABS
0.2500 mg | ORAL_TABLET | Freq: Once | ORAL | Status: AC
  Administered 2023-09-20: 0.25 mg via ORAL
  Filled 2023-09-20: qty 1

## 2023-09-20 MED ORDER — POTASSIUM CHLORIDE CRYS ER 20 MEQ PO TBCR
40.0000 meq | EXTENDED_RELEASE_TABLET | Freq: Once | ORAL | Status: AC
  Administered 2023-09-20: 40 meq via ORAL
  Filled 2023-09-20: qty 2

## 2023-09-20 MED ORDER — FUROSEMIDE 10 MG/ML IJ SOLN
20.0000 mg | Freq: Once | INTRAMUSCULAR | Status: AC
  Administered 2023-09-20: 20 mg via INTRAVENOUS
  Filled 2023-09-20: qty 4

## 2023-09-20 NOTE — Consult Note (Signed)
[facility] CLINIC CARDIOLOGY CONSULT NOTE       Patient ID: Cindy Gutierrez MRN: [id number] DOB/AGE: [birth year] 62 y.o.  Admit date: 09/18/2023 Referring Physician Dr. Joylene Igo Primary Physician Administration, [facility] Cardiologist Dr. Darrold Junker (last seen 2019) Reason for Consultation elevated troponin  HPI: Cindy Gutierrez is a 62 y.o. female  with a past medical history of COPD, hypertension, tobacco use, depression, hepatitis C who presented to the ED on 09/18/2023 for shortness of breath and cough.  She reported recent URI symptoms, suspected COPD exacerbation.  Troponins were checked and found to be mildly elevated.  Cardiology was consulted for further evaluation.   Patient states that she was in her usual state of health until a few days ago when she began having worsening shortness of breath as well as cough.  She states that usually whenever she has an upper respiratory infection she ends up with a COPD exacerbation.  States that she had worsening shortness of breath and was having a difficult time breathing thus she came to the ED for further evaluation.  Found to be hypoxic in the ED was placed on 6 L of supplemental O2.  Additional workup in the ED revealed creatinine 0.89, potassium 3.4, WBC 8.7, hemoglobin 12.0.  BNP 460, similar to priors.  Troponins trended 18 > 90 > 35 > 37.  EKG demonstrated normal sinus rhythm, similar to priors, nonacute.  Chest x-ray with no acute abnormalities.  She was started on IV steroids, antibiotics, DuoNebs in the ED.  At the time of my evaluation this afternoon patient is resting comfortably in hospital bed.  We discussed her symptoms that brought her to the ED.  She reports similar episodes in the past when she had COPD exacerbations which are nearly always preceded by URI.  She reports URI symptoms prior to this episode with runny nose, cough.  She states that she also noticed that she was wheezing at home.  Overall she is feeling better now than  when she first came in.  She has been weaned to 2 L nasal cannula.  She denies any episodes of chest pain/pressure, also denies any other exertional symptoms.  States that she is generally able to do activities she wishes without limitations aside from her baseline shortness of breath from COPD.  Review of systems complete and found to be negative unless listed above    Past Medical History:  Diagnosis Date   Aortic valve regurgitation    COPD (chronic obstructive pulmonary disease) (HCC)    Depression    Hepatitis C    Hyperparathyroidism (HCC)    Hypertension    Mitral valve regurgitation    Non-toxic multinodular goiter    Pulmonary hypertension (HCC) 2022   noted on CT   Pulmonary nodule    Thyroid disease     Past Surgical History:  Procedure Laterality Date   COLONOSCOPY WITH PROPOFOL N/A 08/21/2018   Procedure: COLONOSCOPY WITH PROPOFOL;  Surgeon: Toney Reil, MD;  Location: [facility] ENDOSCOPY;  Service: Gastroenterology;  Laterality: N/A;   PARATHYROIDECTOMY Left    TUBAL LIGATION      Medications Prior to Admission  Medication Sig Dispense Refill Last Dose/Taking   albuterol (PROVENTIL HFA;VENTOLIN HFA) 108 (90 Base) MCG/ACT inhaler Inhale 1-2 puffs into the lungs every 6 (six) hours as needed for wheezing or shortness of breath. 1 Inhaler 0 09/18/2023 at  9:00 PM   aspirin 81 MG chewable tablet Chew 81 mg by mouth daily.   Past Week  budesonide-formoterol (SYMBICORT) 160-4.5 MCG/ACT inhaler Inhale 2 puffs into the lungs 2 (two) times daily.   09/18/2023   celecoxib (CELEBREX) 100 MG capsule Take 100 mg by mouth 2 (two) times daily.   09/18/2023 at  9:00 AM   chlorthalidone (HYGROTON) 25 MG tablet Take 12.5 mg by mouth daily.   Past Week   furosemide (LASIX) 20 MG tablet Take 20 mg by mouth daily.   Past Week   pregabalin (LYRICA) 150 MG capsule Take 150 mg by mouth 2 (two) times daily.   09/18/2023 at  9:00 AM   albuterol (PROVENTIL) (2.5 MG/3ML) 0.083% nebulizer  solution Take 2.5 mg by nebulization every 6 (six) hours as needed for wheezing or shortness of breath.      amLODipine (NORVASC) 10 MG tablet Take 10 mg by mouth daily. (Patient not taking: Reported on 09/19/2023)   Not Taking   calcium carbonate (TUMS - DOSED IN MG ELEMENTAL CALCIUM) 500 MG chewable tablet Chew 1 tablet by mouth daily. As needed      Cholecalciferol 25 MCG (1000 UT) tablet Take 3,000 Units by mouth daily.      diclofenac Sodium (VOLTAREN) 1 % GEL Apply 2 g topically 4 (four) times daily.      gabapentin (NEURONTIN) 300 MG capsule Take 300 mg by mouth at bedtime. (Patient not taking: Reported on 09/19/2023)   Not Taking   Glecaprevir-Pibrentasvir (MAVYRET) 100-40 MG TABS Take 3 tablets by mouth daily.      guaiFENesin (ROBITUSSIN) 100 MG/5ML liquid Take 5 mLs by mouth every 4 (four) hours as needed for cough or to loosen phlegm.      nicotine (NICODERM CQ - DOSED IN MG/24 HOURS) 14 mg/24hr patch Place 14 mg onto the skin daily.      OVER THE COUNTER MEDICATION Take 1 Dose by mouth daily. IRISH SEA MOSS SUPPLEMENT      terbinafine (LAMISIL) 250 MG tablet Take 250 mg by mouth daily. (Patient not taking: Reported on 09/19/2023)   Not Taking   Social History   Socioeconomic History   Marital status: Single    Spouse name: Not on file   Number of children: Not on file   Years of education: Not on file   Highest education level: Not on file  Occupational History   Not on file  Tobacco Use   Smoking status: Every Day    Current packs/day: 0.25    Average packs/day: 0.3 packs/day for 45.0 years (11.3 ttl pk-yrs)    Types: Cigarettes   Smokeless tobacco: Never  Vaping Use   Vaping status: Never Used  Substance and Sexual Activity   Alcohol use: Not Currently   Drug use: No   Sexual activity: Not on file  Other Topics Concern   Not on file  Social History Narrative   Not on file   Social Drivers of Health   Financial Resource Strain: Not on file  Food Insecurity:  Food Insecurity Present (09/19/2023)   Hunger Vital Sign    Worried About Running Out of Food in the Last Year: Sometimes true    Ran Out of Food in the Last Year: Sometimes true  Transportation Needs: No Transportation Needs (09/19/2023)   PRAPARE - Administrator, Civil Service (Medical): No    Lack of Transportation (Non-Medical): No  Physical Activity: Not on file  Stress: Not on file  Social Connections: Not on file  Intimate Partner Violence: Not At Risk (09/19/2023)   Humiliation, Afraid, Rape, and  Kick questionnaire    Fear of Current or Ex-Partner: No    Emotionally Abused: No    Physically Abused: No    Sexually Abused: No    Family History  Problem Relation Age of Onset   Alzheimer's disease Mother    Heart disease Father      Vitals:   09/19/23 2129 09/20/23 0329 09/20/23 0810 09/20/23 0838  BP:  (!) 153/51 (!) 192/104 (!) 177/72  Pulse:  80 94 73  Resp:  18 (!) 22 20  Temp:  98.2 F (36.8 C) 98 F (36.7 C)   TempSrc:  Oral    SpO2: 98% 98% 95% 100%  Weight:      Height:        PHYSICAL EXAM General: Chronically ill-appearing female, well nourished, in no acute distress. HEENT: Normocephalic and atraumatic. Neck: No JVD.  Lungs: Normal respiratory effort on 2L [place].  Wheezing bilaterally.  Heart: HRRR. Normal S1 and S2 without gallops or murmurs.  Abdomen: Non-distended appearing.  Msk: Normal strength and tone for age. Extremities: Warm and well perfused. No clubbing, cyanosis.  No edema.  Neuro: Alert and oriented X 3. Psych: Answers questions appropriately.   Labs: Basic Metabolic Panel: Recent Labs    09/18/23 2138 09/19/23 0508  NA 143 140  K 3.4* 3.6  CL 107 106  CO2 28 23  GLUCOSE 121* 214*  BUN 11 10  CREATININE 0.89 0.69  CALCIUM 8.3* 8.3*   Liver Function Tests: No results for input(s): "AST", "ALT", "ALKPHOS", "BILITOT", "PROT", "ALBUMIN" in the last 72 hours. No results for input(s): "LIPASE", "AMYLASE" in the last 72  hours. CBC: Recent Labs    09/18/23 2138 09/19/23 0508  WBC 8.7 8.7  HGB 12.0 11.8*  HCT 38.3 37.0  MCV 85.5 84.5  PLT 318 292   Cardiac Enzymes: Recent Labs    09/19/23 0508 09/20/23 0837 09/20/23 1026  TROPONINIHS 90* 35* 37*   BNP: Recent Labs    09/20/23 0837  BNP 460.4*   D-Dimer: No results for input(s): "DDIMER" in the last 72 hours. Hemoglobin A1C: No results for input(s): "HGBA1C" in the last 72 hours. Fasting Lipid Panel: No results for input(s): "CHOL", "HDL", "LDLCALC", "TRIG", "CHOLHDL", "LDLDIRECT" in the last 72 hours. Thyroid Function Tests: No results for input(s): "TSH", "T4TOTAL", "T3FREE", "THYROIDAB" in the last 72 hours.  Invalid input(s): "FREET3" Anemia Panel: No results for input(s): "VITAMINB12", "FOLATE", "FERRITIN", "TIBC", "IRON", "RETICCTPCT" in the last 72 hours.   Radiology: [facility] Chest Port 1 View Result Date: 09/18/2023 CLINICAL DATA:  Shortness of breath for 3 days EXAM: PORTABLE CHEST 1 VIEW COMPARISON:  06/19/2023 FINDINGS: Stable cardiomediastinal silhouette. Hyperinflation and chronic bronchitic change. No focal consolidation, pleural effusion, or pneumothorax. No displaced rib fractures. IMPRESSION: No acute process.  Emphysema. Electronically Signed   By: Minerva Fester M.D.   On: 09/18/2023 22:47    ECHO pending  TELEMETRY reviewed by me 09/20/2023: Not on telemetry  EKG reviewed by me: NSR rate 87 bpm, stable from prior, nonacute  Data reviewed by me 09/20/2023: last 24h vitals tele labs imaging I/O ED provider note, admission H&P  Principal Problem:   COPD exacerbation (HCC) Active Problems:   Essential hypertension   Tobacco use   Dyslipidemia   Peripheral neuropathy   Hypokalemia    ASSESSMENT AND PLAN:  Cindy Gutierrez is a 62 y.o. female  with a past medical history of COPD, hypertension, tobacco use, depression, hepatitis C who presented to the ED  on 09/18/2023 for shortness of breath and cough.  She reported  recent URI symptoms, suspected COPD exacerbation.  Troponins were checked and found to be mildly elevated.  Cardiology was consulted for further evaluation.   # Demand ischemia # COPD exacerbation # Acute hypoxic respiratory failure Patient presented with cough, worsening shortness of breath after recent URI.  Has history of COPD and this episode is similar to prior exacerbations.  Currently being treated for COPD exacerbation with steroids, antibiotics, DuoNebs.  Troponins checked and trended 18 > 90 > 35 > 37.  EKG stable from prior and nonacute, no concerning ST-T changes. -Echo pending. -Management of COPD exacerbation per primary. -Mild and flat troponin elevation most consistent with demand/supply mismatch and not ACS in the absence of chest pain and in the setting of acute COPD exacerbation and hypoxic respiratory failure. -No plan for further cardiac diagnostics at this time. -Recommend aggressive risk factor modification including smoking cessation.  #Hypertension Patient with a history of hypertension controlled on home medications.  Elevated during admission. -Continue amlodipine 10 mg daily, chlorthalidone 12.5 mg daily.   This patient's plan of care was discussed and created with Dr. Corky Sing and he is in agreement.  Signed: Gale Journey, PA-C  09/20/2023, 2:07 PM [facility] Cardiology

## 2023-09-20 NOTE — Progress Notes (Signed)
  Progress Note   Patient: Cindy Gutierrez ZOX:[id number] DOB: 1960/10/19 DOA: 09/18/2023     0 DOS: the patient was seen and examined on 09/20/2023   Brief hospital course: Cindy Gutierrez is a 62 y.o. female with medical history significant for COPD, depression, hepatitis C, essential hypertension, and tobacco abuse, who presented to the emergency room with acute onset of worsening dyspnea with associated congested cough with inability to expectorate and wheezing since Saturday which was associated with URI symptoms including rhinorrhea and nasal congestion.  Patient also had hypoxia, was placed on 6 L oxygen admission.  She is also placed on steroids for COPD exacerbation.     Principal Problem:   COPD exacerbation (HCC) Active Problems:   Hypokalemia   Essential hypertension   Tobacco use   Dyslipidemia   Peripheral neuropathy   Assessment and Plan:  * COPD exacerbation (HCC) Acute respiratory failure with hypoxia. Most likely secondary to acute diastolic dysfunction CHF Patient remains hypoxic and is still on oxygen supplementation, she is down to 2 L from 6 L Patient tells me she was on home oxygen at 2 L continuous but this was discontinued a few months ago. Continue scheduled and as needed bronchodilator therapy, antitussives and systemic steroids We will attempt to wean patient off oxygen as tolerated.    Hypokalemia Secondary to diuretic use Supplemented    Tobacco use - Smoking cessation counseling was discussed with patient in detail.   Essential hypertension Continue Amlodipine and chlorthalidone    Dyslipidemia - We will continue statin therapy.    Peripheral neuropathy - Continue Lyrica.   Elevated Troponin Most likely related to acute COPD exacerbation Troponin is flat Follow up results of 2D Echocardiogram to assess LVEF         Subjective: Patient is seen and examined at the bedside. Complains of feeling anxious. Remains SOB at  rest  Physical Exam: Vitals:   09/19/23 2129 09/20/23 0329 09/20/23 0810 09/20/23 0838  BP:  (!) 153/51 (!) 192/104 (!) 177/72  Pulse:  80 94 73  Resp:  18 (!) 22 20  Temp:  98.2 F (36.8 C) 98 F (36.7 C)   TempSrc:  Oral    SpO2: 98% 98% 95% 100%  Weight:      Height:       General exam: Anxious and tearful Respiratory system: Decreased breathing sounds. Rales at the bases Respiratory effort normal. Cardiovascular system: S1 & S2 heard, RRR. No JVD, murmurs, rubs, gallops or clicks. No pedal edema. Gastrointestinal system: Abdomen is nondistended, soft and nontender. No organomegaly or masses felt. Normal bowel sounds heard. Central nervous system: Alert and oriented. No focal neurological deficits. Extremities: Symmetric 5 x 5 power. Skin: No rashes, lesions or ulcers Psychiatry: Judgement and insight appear normal. Mood & affect appropriate.      Data Reviewed: Labs reviewed. Glucose 214, BNP 460, troponin 35 >> 37 There are no new results to review at this time.  Family Communication: Plan of care discussed with patient at the bedside.  All questions and concerns have been addressed.  Disposition: Status is: Observation The patient remains OBS appropriate and will d/c before 2 midnights.  Planned Discharge Destination: Home    Time spent: 35 minutes  Author: Lucile Shutters, MD 09/20/2023 12:38 PM  For on call review [email or web address].

## 2023-09-20 NOTE — Plan of Care (Signed)

## 2023-09-21 DIAGNOSIS — J441 Chronic obstructive pulmonary disease with (acute) exacerbation: Secondary | ICD-10-CM | POA: Diagnosis not present

## 2023-09-21 LAB — BASIC METABOLIC PANEL
Anion gap: 9 (ref 5–15)
BUN: 19 mg/dL (ref 8–23)
CO2: 28 mmol/L (ref 22–32)
Calcium: 8.6 mg/dL — ABNORMAL LOW (ref 8.9–10.3)
Chloride: 101 mmol/L (ref 98–111)
Creatinine, Ser: 0.88 mg/dL (ref 0.44–1.00)
GFR, Estimated: >60 mL/min (ref >60)
Glucose, Bld: 131 mg/dL — ABNORMAL HIGH (ref 70–99)
Potassium: 3.3 mmol/L — ABNORMAL LOW (ref 3.5–5.1)
Sodium: 138 mmol/L (ref 135–145)

## 2023-09-21 LAB — ECHOCARDIOGRAM COMPLETE
Area-P 1/2: 2.8 cm2
Height: 69 in
P 1/2 time: 436 ms
S' Lateral: 2.7 cm
Weight: 3089.97 [oz_av]

## 2023-09-21 MED ORDER — POTASSIUM CHLORIDE CRYS ER 20 MEQ PO TBCR
40.0000 meq | EXTENDED_RELEASE_TABLET | Freq: Once | ORAL | Status: AC
  Administered 2023-09-21: 40 meq via ORAL
  Filled 2023-09-21: qty 2

## 2023-09-21 MED ORDER — PREDNISONE 20 MG PO TABS: 40.0000 mg | ORAL_TABLET | Freq: Every day | ORAL | 0 refills | Status: AC

## 2023-09-21 NOTE — Progress Notes (Signed)
[name] CLINIC CARDIOLOGY CONSULT NOTE       Patient ID: Cindy Gutierrez MRN: [id number] DOB/AGE: Nov 28, 1960 62 y.o.  Admit date: 09/18/2023 Referring Physician Dr. Joylene Igo Primary Physician Administration, [facility] Cardiologist Dr. Darrold Junker (last seen 2019) Reason for Consultation elevated troponin  HPI: Cindy Gutierrez is a 62 y.o. female  with a past medical history of COPD, hypertension, tobacco use, depression, hepatitis C who presented to the ED on 09/18/2023 for shortness of breath and cough.  She reported recent URI symptoms, suspected COPD exacerbation.  Troponins were checked and found to be mildly elevated.  Cardiology was consulted for further evaluation.   Interval history: -Patient reports she feels better today, states breathing has improved.  -Denies any chest pain, palpitations.  -BP and HR remain stable. On baseline O2 requirement.   Review of systems complete and found to be negative unless listed above    Past Medical History:  Diagnosis Date   Aortic valve regurgitation    COPD (chronic obstructive pulmonary disease) (HCC)    Depression    Hepatitis C    Hyperparathyroidism (HCC)    Hypertension    Mitral valve regurgitation    Non-toxic multinodular goiter    Pulmonary hypertension (HCC) 2022   noted on CT   Pulmonary nodule    Thyroid disease     Past Surgical History:  Procedure Laterality Date   COLONOSCOPY WITH PROPOFOL N/A 08/21/2018   Procedure: COLONOSCOPY WITH PROPOFOL;  Surgeon: Toney Reil, MD;  Location: [facility] ENDOSCOPY;  Service: Gastroenterology;  Laterality: N/A;   PARATHYROIDECTOMY Left    TUBAL LIGATION      Medications Prior to Admission  Medication Sig Dispense Refill Last Dose/Taking   albuterol (PROVENTIL HFA;VENTOLIN HFA) 108 (90 Base) MCG/ACT inhaler Inhale 1-2 puffs into the lungs every 6 (six) hours as needed for wheezing or shortness of breath. 1 Inhaler 0 09/18/2023 at  9:00 PM   aspirin 81 MG chewable  tablet Chew 81 mg by mouth daily.   Past Week   budesonide-formoterol (SYMBICORT) 160-4.5 MCG/ACT inhaler Inhale 2 puffs into the lungs 2 (two) times daily.   09/18/2023   celecoxib (CELEBREX) 100 MG capsule Take 100 mg by mouth 2 (two) times daily.   09/18/2023 at  9:00 AM   chlorthalidone (HYGROTON) 25 MG tablet Take 12.5 mg by mouth daily.   Past Week   furosemide (LASIX) 20 MG tablet Take 20 mg by mouth daily.   Past Week   pregabalin (LYRICA) 150 MG capsule Take 150 mg by mouth 2 (two) times daily.   09/18/2023 at  9:00 AM   albuterol (PROVENTIL) (2.5 MG/3ML) 0.083% nebulizer solution Take 2.5 mg by nebulization every 6 (six) hours as needed for wheezing or shortness of breath.      amLODipine (NORVASC) 10 MG tablet Take 10 mg by mouth daily. (Patient not taking: Reported on 09/19/2023)   Not Taking   calcium carbonate (TUMS - DOSED IN MG ELEMENTAL CALCIUM) 500 MG chewable tablet Chew 1 tablet by mouth daily. As needed      Cholecalciferol 25 MCG (1000 UT) tablet Take 3,000 Units by mouth daily.      diclofenac Sodium (VOLTAREN) 1 % GEL Apply 2 g topically 4 (four) times daily.      gabapentin (NEURONTIN) 300 MG capsule Take 300 mg by mouth at bedtime. (Patient not taking: Reported on 09/19/2023)   Not Taking   Glecaprevir-Pibrentasvir (MAVYRET) 100-40 MG TABS Take 3 tablets by mouth daily.  guaiFENesin (ROBITUSSIN) 100 MG/5ML liquid Take 5 mLs by mouth every 4 (four) hours as needed for cough or to loosen phlegm.      nicotine (NICODERM CQ - DOSED IN MG/24 HOURS) 14 mg/24hr patch Place 14 mg onto the skin daily.      OVER THE COUNTER MEDICATION Take 1 Dose by mouth daily. IRISH SEA MOSS SUPPLEMENT      terbinafine (LAMISIL) 250 MG tablet Take 250 mg by mouth daily. (Patient not taking: Reported on 09/19/2023)   Not Taking   Social History   Socioeconomic History   Marital status: Single    Spouse name: Not on file   Number of children: Not on file   Years of education: Not on file    Highest education level: Not on file  Occupational History   Not on file  Tobacco Use   Smoking status: Every Day    Current packs/day: 0.25    Average packs/day: 0.3 packs/day for 45.0 years (11.3 ttl pk-yrs)    Types: Cigarettes   Smokeless tobacco: Never  Vaping Use   Vaping status: Never Used  Substance and Sexual Activity   Alcohol use: Not Currently   Drug use: No   Sexual activity: Not on file  Other Topics Concern   Not on file  Social History Narrative   Not on file   Social Drivers of Health   Financial Resource Strain: Not on file  Food Insecurity: Food Insecurity Present (09/19/2023)   Hunger Vital Sign    Worried About Running Out of Food in the Last Year: Sometimes true    Ran Out of Food in the Last Year: Sometimes true  Transportation Needs: No Transportation Needs (09/19/2023)   PRAPARE - Administrator, Civil Service (Medical): No    Lack of Transportation (Non-Medical): No  Physical Activity: Not on file  Stress: Not on file  Social Connections: Not on file  Intimate Partner Violence: Not At Risk (09/19/2023)   Humiliation, Afraid, Rape, and Kick questionnaire    Fear of Current or Ex-Partner: No    Emotionally Abused: No    Physically Abused: No    Sexually Abused: No    Family History  Problem Relation Age of Onset   Alzheimer's disease Mother    Heart disease Father      Vitals:   09/20/23 1945 09/21/23 0157 09/21/23 0303 09/21/23 0729  BP:   (!) 141/51 (!) 149/62  Pulse:   (!) 57 (!) 56  Resp:   16 18  Temp:   97.9 F (36.6 C) 98.4 F (36.9 C)  TempSrc:   Oral Oral  SpO2: 97% 96% 98% 99%  Weight:      Height:        PHYSICAL EXAM General: Chronically ill-appearing female, well nourished, in no acute distress. HEENT: Normocephalic and atraumatic. Neck: No JVD.  Lungs: Normal respiratory effort on 2L [place].  Wheezing bilaterally, improved overall.  Heart: HRRR. Normal S1 and S2 without gallops or murmurs.  Abdomen:  Non-distended appearing.  Msk: Normal strength and tone for age. Extremities: Warm and well perfused. No clubbing, cyanosis.  No edema.  Neuro: Alert and oriented X 3. Psych: Answers questions appropriately.   Labs: Basic Metabolic Panel: Recent Labs    09/19/23 0508 09/21/23 0521  NA 140 138  K 3.6 3.3*  CL 106 101  CO2 23 28  GLUCOSE 214* 131*  BUN 10 19  CREATININE 0.69 0.88  CALCIUM 8.3* 8.6*  Liver Function Tests: No results for input(s): "AST", "ALT", "ALKPHOS", "BILITOT", "PROT", "ALBUMIN" in the last 72 hours. No results for input(s): "LIPASE", "AMYLASE" in the last 72 hours. CBC: Recent Labs    09/18/23 2138 09/19/23 0508  WBC 8.7 8.7  HGB 12.0 11.8*  HCT 38.3 37.0  MCV 85.5 84.5  PLT 318 292   Cardiac Enzymes: Recent Labs    09/19/23 0508 09/20/23 0837 09/20/23 1026  TROPONINIHS 90* 35* 37*   BNP: Recent Labs    09/20/23 0837  BNP 460.4*   D-Dimer: No results for input(s): "DDIMER" in the last 72 hours. Hemoglobin A1C: No results for input(s): "HGBA1C" in the last 72 hours. Fasting Lipid Panel: No results for input(s): "CHOL", "HDL", "LDLCALC", "TRIG", "CHOLHDL", "LDLDIRECT" in the last 72 hours. Thyroid Function Tests: No results for input(s): "TSH", "T4TOTAL", "T3FREE", "THYROIDAB" in the last 72 hours.  Invalid input(s): "FREET3" Anemia Panel: No results for input(s): "VITAMINB12", "FOLATE", "FERRITIN", "TIBC", "IRON", "RETICCTPCT" in the last 72 hours.   Radiology: DG Chest 1 View Result Date: 09/20/2023 CLINICAL DATA:  Shortness of breath. EXAM: CHEST  1 VIEW COMPARISON:  September 18, 2023. FINDINGS: The heart size and mediastinal contours are within normal limits. No acute pulmonary disease is noted. Hyperinflation of the lungs is noted. The visualized skeletal structures are unremarkable. IMPRESSION: No active disease.  Hyperinflation of lungs. Electronically Signed   By: Lupita Raider M.D.   On: 09/20/2023 14:50   DG Chest Port 1  View Result Date: 09/18/2023 CLINICAL DATA:  Shortness of breath for 3 days EXAM: PORTABLE CHEST 1 VIEW COMPARISON:  06/19/2023 FINDINGS: Stable cardiomediastinal silhouette. Hyperinflation and chronic bronchitic change. No focal consolidation, pleural effusion, or pneumothorax. No displaced rib fractures. IMPRESSION: No acute process.  Emphysema. Electronically Signed   By: Minerva Fester M.D.   On: 09/18/2023 22:47    ECHO pending, preliminary read with preserved EF, no WMAs  TELEMETRY reviewed by me 09/21/2023: Not on telemetry  EKG reviewed by me: NSR rate 87 bpm, stable from prior, nonacute  Data reviewed by me 09/21/2023: last 24h vitals tele labs imaging I/O hospitalist progress note  Principal Problem:   COPD exacerbation (HCC) Active Problems:   Essential hypertension   Tobacco use   Dyslipidemia   Peripheral neuropathy   Hypokalemia    ASSESSMENT AND PLAN:  JUNG BURBACK is a 62 y.o. female  with a past medical history of COPD, hypertension, tobacco use, depression, hepatitis C who presented to the ED on 09/18/2023 for shortness of breath and cough.  She reported recent URI symptoms, suspected COPD exacerbation.  Troponins were checked and found to be mildly elevated.  Cardiology was consulted for further evaluation.   # Demand ischemia # COPD exacerbation # Acute hypoxic respiratory failure Patient presented with cough, worsening shortness of breath after recent URI.  Has history of COPD and this episode is similar to prior exacerbations.  Currently being treated for COPD exacerbation with steroids, antibiotics, DuoNebs.  Troponins checked and trended 18 > 90 > 35 > 37.  EKG stable from prior and nonacute, no concerning ST-T changes. Preliminary echo read with preserved EF, no WMAs. -Management of COPD exacerbation per primary. -Mild and flat troponin elevation most consistent with demand/supply mismatch and not ACS in the absence of chest pain and in the setting of acute  COPD exacerbation and hypoxic respiratory failure. -No plan for further cardiac diagnostics at this time. -Recommend aggressive risk factor modification including smoking cessation.  #Hypertension  Patient with a history of hypertension controlled on home medications.  Elevated during admission. -Continue amlodipine 10 mg daily, chlorthalidone 12.5 mg daily.  Cardiology will sign off. Please haiku with questions or re-engage if needed.    This patient's plan of care was discussed and created with Dr. Corky Sing and he is in agreement.  Signed: Gale Journey, PA-C  09/21/2023, 9:19 AM [facility] Cardiology

## 2023-09-21 NOTE — TOC CM/SW Note (Signed)
Patient did not have qualifying o2 sats for home o2.    Food resources added to AVS

## 2023-09-21 NOTE — Discharge Instructions (Signed)
 Food Resources  Agency Name: [facility] Agency Address: [address] [place], [place] [address] Phone: [phone] Website: [email or web address] Service(s) Offered: Housing services, self-sufficiency, congregate meal program, weatherization program, Event organiser program, emergency food assistance,  housing counseling, home ownership program, wheels - to work program.  [employer] free for 60 and older at various locations from [employer], Monday-Friday:  [employer], [address] [place], [phone] -[facility], [address], [place] [phone]  -[facility] [address], [place] [phone]  -[address], [place], [phone]  Agency Name: [facility] Address: [phone] [address], [place], [place] [address] Phone: [phone] Website: www.[email or web address] Service(s) Offered: Home delivered hot, frozen, and emergency  meals. Grocery assistance program which matches  volunteers one-on-one with seniors unable to grocery shop  for themselves. Must be 60 years and older; less than 20  hours of in-home aide service, limited or no driving ability;  live alone or with someone with a disability; live in  [place].  Agency Name: [employer] [facility] [employer]) Address: [address] [place], [place] [address] Phone: [phone] Service(s) Offered: Food is served to shut-ins, homeless, elderly, and low income people in the community every Saturday (11:30 am-12:30 pm) and Sunday (12:30 pm-1:30pm). Volunteers also offer help and encouragement in seeking employment,  and spiritual guidance.  Agency Name: [employer] Address: [address] [place], [place] [address] Phone: [phone] Service(s) Offered: Child support services; child welfare services; food stamps; Medicaid; work first family assistance; and aid  with fuel,  rent, food and medicine.  Agency Name: [employer] Address: [address] [place], [place] Phone: [phone] Website: [email or web address] Services Offered: Monday 10:00am-12:00, 8:00pm-9:00pm, and Friday 10:00am-12:00.  Agency Name: [employer] Address: [address], [place], [place] [address] Phone: [phone] Website: [email or web address] Service(s) Offered: Serves weekday meals, open from 11:30 am- 1:00 pm., and 6:30-7:30pm, Monday-Wednesday-Friday distributes food 3:30-6pm, Monday-Wednesday-Friday.  Agency Name: [facility] Address: [address] [place], [place] Phone: [phone] Website: [email or web address] Services Offered: Distributes food the 4th Saturday of the month, starting at 8:00 am  Agency Name: [facility] Address: [phone] [address], [place], [place] [address] Phone: [phone] Website: [email or web address] Service(s) Offered: Bread of life, weekly food pantry. Open Wednesdays from 10:00am-noon.  Agency Name: [facility] [employer] Address: [address] [place], [place], [place] Phone: [phone] Services Offered: Distributes food 9am-1pm, Monday-Thursday. Call for details.  Agency Name: [facility] Address: [address] [place], [place] [address] Phone: [phone] Website: [email or web address] Service(s) Offered: Games developer. Call for assistance.  Agency Name: [facility] Address: [address], [place], [place] [address] Phone: [phone] Service Offered: Emergency Food Pantry. Call for appointment.  Agency Name: [facility] Address: [address] [place], [place] [address] Phone: [phone] Website: [email or web address] Services Offered: Games developer. Call for details  Agency Name: [facility] Address: [address] [place], [place] Phone:  [phone] Website: [email or web address] Service(s) Offered: Emergency Food Pantry. Call for details.  Agency Name: [employer] Address: [address] [place], [place] [address] Phone: [phone] or [phone] Website: [email or web address] Service(s) Offered: Distribute food 9am-11:30 am, Tuesday-Friday, and 1-3:30pm, Monday-Friday. Food pantry Monday-Friday 1pm-3pm, fresh items, Mon.-Wed.-Fri.  Agency Name: [facility] Address: [address] [place], [place] [address] Phone: [phone] Website: [email or web address] Services Offered: Distribute food Tues and Sats from 9:00am-noon.  Closed 1st Saturday of each month. Call for details  Agency Name: [employer] Address: [facility] [address] [place] [address] Phone: [phone]  Services Offered: Delivers meals every Thursday  [facility] patient has an appointment for Monday November 4th 2024 at 745 am.

## 2023-09-21 NOTE — Progress Notes (Signed)
SATURATION QUALIFICATIONS: (This note is used to comply with regulatory documentation for home oxygen)  Patient Saturations on Room Air at Rest = 96%  Patient Saturations on Room Air while Ambulating = 95%  

## 2023-09-21 NOTE — Plan of Care (Signed)

## 2023-09-21 NOTE — Discharge Summary (Signed)
Physician Discharge Summary   Patient: Cindy Gutierrez MRN: [id number] DOB: 1960/12/31  Admit date:     09/18/2023  Discharge date: 09/21/23  Discharge Physician: Marcio Hoque   [facility]   Recommendations at discharge:   Take medications as recommended  Discharge Diagnoses: Principal Problem:   COPD exacerbation (HCC) Active Problems:   Hypokalemia   Essential hypertension   Tobacco use   Dyslipidemia   Peripheral neuropathy  Resolved Problems:   * No resolved hospital problems. *  Hospital Course: Cindy Gutierrez is a 62 y.o. female with medical history significant for COPD, depression, hepatitis C, essential hypertension, and tobacco abuse, who presented to the emergency room with acute onset of worsening dyspnea with associated congested cough with inability to expectorate and wheezing since Saturday which was associated with URI symptoms including rhinorrhea and nasal congestion.  No fever or chills.  She was noted to be hypoxic by EMS with diminished breath sounds throughout.  She was given multiple nebulized bronchodilator therapy, IV Solu-Medrol and IV magnesium sulfate.  No chest pain or palpitations.  No nausea or vomiting or abdominal pain.  No dysuria, oliguria or hematuria or flank pain.  No bleeding diathesis.   ED Course: When the patient came to the ER, BP was 171/98 with respiratory rate of 23 and pulse oximetry was 100% on 6 L of O2 by nasal cannula.  Labs revealed mild hypokalemia 3.4 and calcium of 8.3 with a blood glucose of 121.  High sensitive troponin I was 18 and CBC was normal.  COVID-19 PCR came back negative. EKG as reviewed by me : EKG showed normal sinus rhythm with a rate of 87 with prolonged PR interval, probable left atrial enlargement and LVH with anterior Q waves. Imaging: Portable chest x-ray showed no acute cardiopulmonary disease.   The patient will be admitted to a medical telemetry observation bed for further evaluation and  management.    Assessment and Plan:  * COPD exacerbation (HCC) Acute respiratory failure with hypoxia. Secondary to acute COPD exacerbation Patient was hypoxic upon arrival and initially required 6 L of oxygen to maintain pulse oximetry greater than 94% She has been weaned off oxygen and room air pulse oximetry at rest was 96% and with ambulation was 95% Patient does not need home oxygen at this time Continue scheduled and as needed bronchodilator therapy, antitussives and systemic steroids      Hypokalemia Secondary to diuretic use Supplemented     Tobacco use - Smoking cessation counseling was discussed with patient in detail.   Essential hypertension Continue Amlodipine and chlorthalidone     Dyslipidemia - We will continue statin therapy.     Peripheral neuropathy - Continue Lyrica.     Elevated Troponin Most likely related to demand ischemia from acute COPD exacerbation/hypoxia Troponin is flat 2D echocardiogram showed an LVEF of 60 to 65% with normal LV function and no evidence of regional wall motion abnormality.       Patient was seen and examined at bedside prior to discharge and is in stable condition      Consultants: Cardiology Procedures performed: 2D echocardiogram Disposition: Home Diet recommendation:  Cardiac diet DISCHARGE MEDICATION: Allergies as of 09/21/2023       Reactions   Sulfa Antibiotics Hives, Rash   Bee Pollen Hives   Lisinopril Nausea Only   Losartan    Penicillins Hives   Tiotropium Bromide Monohydrate    Other reaction(s): Cough   Carvedilol  Medication List     STOP taking these medications    gabapentin 300 MG capsule Commonly known as: NEURONTIN   nicotine 14 mg/24hr patch Commonly known as: NICODERM CQ - dosed in mg/24 hours   OVER THE COUNTER MEDICATION   terbinafine 250 MG tablet Commonly known as: LAMISIL       TAKE these medications    albuterol (2.5 MG/3ML) 0.083% nebulizer  solution Commonly known as: PROVENTIL Take 2.5 mg by nebulization every 6 (six) hours as needed for wheezing or shortness of breath.   albuterol 108 (90 Base) MCG/ACT inhaler Commonly known as: VENTOLIN HFA Inhale 1-2 puffs into the lungs every 6 (six) hours as needed for wheezing or shortness of breath.   amLODipine 10 MG tablet Commonly known as: NORVASC Take 10 mg by mouth daily.   aspirin 81 MG chewable tablet Chew 81 mg by mouth daily.   budesonide-formoterol 160-4.5 MCG/ACT inhaler Commonly known as: SYMBICORT Inhale 2 puffs into the lungs 2 (two) times daily.   calcium carbonate 500 MG chewable tablet Commonly known as: TUMS - dosed in mg elemental calcium Chew 1 tablet by mouth daily. As needed   celecoxib 100 MG capsule Commonly known as: CELEBREX Take 100 mg by mouth 2 (two) times daily.   chlorthalidone 25 MG tablet Commonly known as: HYGROTON Take 12.5 mg by mouth daily.   Cholecalciferol 25 MCG (1000 UT) tablet Take 3,000 Units by mouth daily.   diclofenac Sodium 1 % Gel Commonly known as: VOLTAREN Apply 2 g topically 4 (four) times daily.   furosemide 20 MG tablet Commonly known as: LASIX Take 20 mg by mouth daily.   guaiFENesin 100 MG/5ML liquid Commonly known as: ROBITUSSIN Take 5 mLs by mouth every 4 (four) hours as needed for cough or to loosen phlegm.   Mavyret 100-40 MG Tabs Generic drug: Glecaprevir-Pibrentasvir Take 3 tablets by mouth daily.   predniSONE 20 MG tablet Commonly known as: DELTASONE Take 2 tablets (40 mg total) by mouth daily with breakfast for 5 days. Start taking on: September 22, 2023   pregabalin 150 MG capsule Commonly known as: LYRICA Take 150 mg by mouth 2 (two) times daily.        Discharge Exam: Filed Weights   09/18/23 2108  Weight: 87.6 kg   General exam: Anxious and tearful Respiratory system: Decreased breathing sounds. Rales at the bases Respiratory effort normal. Cardiovascular system: S1 & S2  heard, RRR. No JVD, murmurs, rubs, gallops or clicks. No pedal edema. Gastrointestinal system: Abdomen is nondistended, soft and nontender. No organomegaly or masses felt. Normal bowel sounds heard. Central nervous system: Alert and oriented. No focal neurological deficits. Extremities: Symmetric 5 x 5 power. Skin: No rashes, lesions or ulcers Psychiatry: Judgement and insight appear normal. Mood & affect appropriate.     Condition at discharge: stable  The results of significant diagnostics from this hospitalization (including imaging, microbiology, ancillary and laboratory) are listed below for reference.   Imaging Studies: ECHOCARDIOGRAM COMPLETE Result Date: 09/21/2023    ECHOCARDIOGRAM REPORT   Patient Name:   Cindy Gutierrez [facility] Date of Exam: 09/20/2023 Medical Rec #:  [id number]     Height:       69.0 in Accession #:    [id number]    Weight:       193.1 lb Date of Birth:  [birth year]     BSA:          2.036 m Patient Age:    46 years  BP:           153/51 mmHg Patient Gender: F             HR:           79 bpm. Exam Location:  [facility] Procedure: 2D Echo, Cardiac Doppler and Color Doppler Indications:     Elevated Troponin  History:         Patient has prior history of Echocardiogram examinations, most                  recent 05/24/2021. COPD; Risk Factors:Hypertension. Pulmonary                  hypertension.  Sonographer:     Daphine Deutscher RDCS Referring Phys:  [id number] Rashunda Passon Diagnosing Phys: Mellody Drown Alluri IMPRESSIONS  1. Left ventricular ejection fraction, by estimation, is 60 to 65%. The left ventricle has normal function. The left ventricle has no regional wall motion abnormalities. Left ventricular diastolic parameters were normal.  2. Right ventricular systolic function is normal. The right ventricular size is normal. There is mildly elevated pulmonary artery systolic pressure.  3. The mitral valve is normal in structure. Mild mitral valve regurgitation.  4. The aortic valve  is tricuspid. Aortic valve regurgitation is moderate.  5. The inferior vena cava is dilated in size with >50% respiratory variability, suggesting right atrial pressure of 8 mmHg. FINDINGS  Left Ventricle: Left ventricular ejection fraction, by estimation, is 60 to 65%. The left ventricle has normal function. The left ventricle has no regional wall motion abnormalities. The left ventricular internal cavity size was normal in size. There is  no left ventricular hypertrophy. Left ventricular diastolic parameters were normal. Right Ventricle: The right ventricular size is normal. No increase in right ventricular wall thickness. Right ventricular systolic function is normal. There is mildly elevated pulmonary artery systolic pressure. The tricuspid regurgitant velocity is 2.76  m/s, and with an assumed right atrial pressure of 8 mmHg, the estimated right ventricular systolic pressure is 38.5 mmHg. Left Atrium: Left atrial size was normal in size. Right Atrium: Right atrial size was normal in size. Pericardium: There is no evidence of pericardial effusion. Mitral Valve: The mitral valve is normal in structure. Mild mitral valve regurgitation. Tricuspid Valve: The tricuspid valve is normal in structure. Tricuspid valve regurgitation is trivial. The aortic valve is tricuspid. Aortic valve regurgitation is moderate. Pulmonic Valve: The pulmonic valve was not well visualized. Pulmonic valve regurgitation is trivial. Aorta: The aortic root and ascending aorta are structurally normal, with no evidence of dilitation. Venous: The inferior vena cava is dilated in size with greater than 50% respiratory variability, suggesting right atrial pressure of 8 mmHg. IAS/Shunts: No atrial level shunt detected by color flow Doppler.  LEFT VENTRICLE PLAX 2D LVIDd:         4.80 cm   Diastology LVIDs:         2.70 cm   LV e' medial:    7.18 cm/s LV PW:         0.80 cm   LV E/e' medial:  13.4 LV IVS:        0.70 cm   LV e' lateral:   9.14 cm/s  LVOT diam:     2.10 cm   LV E/e' lateral: 10.5 LV SV:         93 LV SV Index:   46 LVOT Area:     3.46 cm  RIGHT VENTRICLE  IVC RV Basal diam:  3.80 cm     IVC diam: 2.20 cm RV S prime:     18.85 cm/s TAPSE (M-mode): 3.1 cm LEFT ATRIUM             Index        RIGHT ATRIUM           Index LA diam:        3.70 cm 1.82 cm/m   RA Area:     13.20 cm LA Vol (A2C):   41.5 ml 20.39 ml/m  RA Volume:   32.30 ml  15.87 ml/m LA Vol (A4C):   34.3 ml 16.85 ml/m LA Biplane Vol: 39.4 ml 19.36 ml/m  AORTIC VALVE LVOT Vmax:   125.00 cm/s LVOT Vmean:  81.833 cm/s LVOT VTI:    0.268 m AI PHT:      436 msec  AORTA Ao Root diam: 3.00 cm Ao Asc diam:  3.00 cm MITRAL VALVE                TRICUSPID VALVE MV Area (PHT): 2.80 cm     TR Peak grad:   30.5 mmHg MV Decel Time: 271 msec     TR Vmax:        276.00 cm/s MV E velocity: 95.95 cm/s MV A velocity: 103.50 cm/s  SHUNTS MV E/A ratio:  0.93         Systemic VTI:  0.27 m                             Systemic Diam: 2.10 cm Windell Norfolk Electronically signed by Windell Norfolk Signature Date/Time: 09/21/2023/1:26:37 PM    Final    DG Chest 1 View Result Date: 09/20/2023 CLINICAL DATA:  Shortness of breath. EXAM: CHEST  1 VIEW COMPARISON:  September 18, 2023. FINDINGS: The heart size and mediastinal contours are within normal limits. No acute pulmonary disease is noted. Hyperinflation of the lungs is noted. The visualized skeletal structures are unremarkable. IMPRESSION: No active disease.  Hyperinflation of lungs. Electronically Signed   By: Lupita Raider M.D.   On: 09/20/2023 14:50   DG Chest Port 1 View Result Date: 09/18/2023 CLINICAL DATA:  Shortness of breath for 3 days EXAM: PORTABLE CHEST 1 VIEW COMPARISON:  06/19/2023 FINDINGS: Stable cardiomediastinal silhouette. Hyperinflation and chronic bronchitic change. No focal consolidation, pleural effusion, or pneumothorax. No displaced rib fractures. IMPRESSION: No acute process.  Emphysema. Electronically Signed    By: Minerva Fester M.D.   On: 09/18/2023 22:47    Microbiology: Results for orders placed or performed during the hospital encounter of 09/18/23  SARS Coronavirus 2 by RT PCR (hospital order, performed in [facility] hospital lab) *cepheid single result test* Anterior Nasal Swab     Status: None   Collection Time: 09/18/23  9:38 PM   Specimen: Anterior Nasal Swab  Result Value Ref Range Status   SARS Coronavirus 2 by RT PCR NEGATIVE NEGATIVE Final    Comment: (NOTE) SARS-CoV-2 target nucleic acids are NOT DETECTED.  The SARS-CoV-2 RNA is generally detectable in upper and lower respiratory specimens during the acute phase of infection. The lowest concentration of SARS-CoV-2 viral copies this assay can detect is 250 copies / mL. A negative result does not preclude SARS-CoV-2 infection and should not be used as the sole basis for treatment or other patient management decisions.  A negative result may occur with improper specimen collection / handling,  submission of specimen other than nasopharyngeal swab, presence of viral mutation(s) within the areas targeted by this assay, and inadequate number of viral copies (<250 copies / mL). A negative result must be combined with clinical observations, patient history, and epidemiological information.  Fact Sheet for Patients:   RoadLapTop.co.za  Fact Sheet for Healthcare Providers: http://kim-miller.com/  This test is not yet approved or  cleared by the [place] FDA and has been authorized for detection and/or diagnosis of SARS-CoV-2 by FDA under an Emergency Use Authorization (EUA).  This EUA will remain in effect (meaning this test can be used) for the duration of the COVID-19 declaration under Section 564(b)(1) of the Act, 21 U.S.C. section 360bbb-3(b)(1), unless the authorization is terminated or revoked sooner.  Performed at [facility], [address]  [place], [place] [address]     Labs: CBC: Recent Labs  Lab 09/18/23 2138 09/19/23 0508  WBC 8.7 8.7  HGB 12.0 11.8*  HCT 38.3 37.0  MCV 85.5 84.5  PLT 318 292   Basic Metabolic Panel: Recent Labs  Lab 09/18/23 2138 09/19/23 0508 09/21/23 0521  NA 143 140 138  K 3.4* 3.6 3.3*  CL 107 106 101  CO2 28 23 28   GLUCOSE 121* 214* 131*  BUN 11 10 19   CREATININE 0.89 0.69 0.88  CALCIUM 8.3* 8.3* 8.6*   Liver Function Tests: No results for input(s): "AST", "ALT", "ALKPHOS", "BILITOT", "PROT", "ALBUMIN" in the last 168 hours. CBG: Recent Labs  Lab 09/18/23 2109  GLUCAP 134*    Discharge time spent: greater than 30 minutes.  Signed: Lucile Shutters, MD [facility] 09/21/2023

## 2023-09-21 NOTE — Progress Notes (Signed)
Cindy Gutierrez to be D/C'd Home per MD order.  Discussed prescriptions and follow up appointments with the patient. Prescriptions given to patient, medication list explained in detail. Pt verbalized understanding.  Allergies as of 09/21/2023       Reactions   Sulfa Antibiotics Hives, Rash   Bee Pollen Hives   Lisinopril Nausea Only   Losartan    Penicillins Hives   Tiotropium Bromide Monohydrate    Other reaction(s): Cough   Carvedilol         Medication List     STOP taking these medications    gabapentin 300 MG capsule Commonly known as: NEURONTIN   nicotine 14 mg/24hr patch Commonly known as: NICODERM CQ - dosed in mg/24 hours   OVER THE COUNTER MEDICATION   terbinafine 250 MG tablet Commonly known as: LAMISIL       TAKE these medications    albuterol (2.5 MG/3ML) 0.083% nebulizer solution Commonly known as: PROVENTIL Take 2.5 mg by nebulization every 6 (six) hours as needed for wheezing or shortness of breath.   albuterol 108 (90 Base) MCG/ACT inhaler Commonly known as: VENTOLIN HFA Inhale 1-2 puffs into the lungs every 6 (six) hours as needed for wheezing or shortness of breath.   amLODipine 10 MG tablet Commonly known as: NORVASC Take 10 mg by mouth daily.   aspirin 81 MG chewable tablet Chew 81 mg by mouth daily.   budesonide-formoterol 160-4.5 MCG/ACT inhaler Commonly known as: SYMBICORT Inhale 2 puffs into the lungs 2 (two) times daily.   calcium carbonate 500 MG chewable tablet Commonly known as: TUMS - dosed in mg elemental calcium Chew 1 tablet by mouth daily. As needed   celecoxib 100 MG capsule Commonly known as: CELEBREX Take 100 mg by mouth 2 (two) times daily.   chlorthalidone 25 MG tablet Commonly known as: HYGROTON Take 12.5 mg by mouth daily.   Cholecalciferol 25 MCG (1000 UT) tablet Take 3,000 Units by mouth daily.   diclofenac Sodium 1 % Gel Commonly known as: VOLTAREN Apply 2 g topically 4 (four) times daily.    furosemide 20 MG tablet Commonly known as: LASIX Take 20 mg by mouth daily.   guaiFENesin 100 MG/5ML liquid Commonly known as: ROBITUSSIN Take 5 mLs by mouth every 4 (four) hours as needed for cough or to loosen phlegm.   Mavyret 100-40 MG Tabs Generic drug: Glecaprevir-Pibrentasvir Take 3 tablets by mouth daily.   predniSONE 20 MG tablet Commonly known as: DELTASONE Take 2 tablets (40 mg total) by mouth daily with breakfast for 5 days. Start taking on: September 22, 2023   pregabalin 150 MG capsule Commonly known as: LYRICA Take 150 mg by mouth 2 (two) times daily.        Vitals:   09/21/23 0303 09/21/23 0729  BP: (!) 141/51 (!) 149/62  Pulse: (!) 57 (!) 56  Resp: 16 18  Temp: 97.9 F (36.6 C) 98.4 F (36.9 C)  SpO2: 98% 99%    Skin clean, dry and intact without evidence of skin break down, no evidence of skin tears noted. IV catheter discontinued intact. Site without signs and symptoms of complications. Dressing and pressure applied. Pt denies pain at this time. No complaints noted.  An After Visit Summary was printed and given to the patient. Patient escorted via WC, and D/C home via private auto.  Cindy Gutierrez C. Jilda [place]

## 2023-10-16 ENCOUNTER — Encounter: Payer: Self-pay | Admitting: Intensive Care

## 2023-10-16 ENCOUNTER — Inpatient Hospital Stay
Admission: EM | Admit: 2023-10-16 | Discharge: 2023-10-19 | DRG: 189 | Disposition: A | Discharge status: Home / Self Care | Payer: No Typology Code available for payment source | Attending: Obstetrics and Gynecology | Admitting: Obstetrics and Gynecology

## 2023-10-16 ENCOUNTER — Inpatient Hospital Stay: Payer: No Typology Code available for payment source

## 2023-10-16 ENCOUNTER — Emergency Department: Payer: No Typology Code available for payment source

## 2023-10-16 DIAGNOSIS — J189 Pneumonia, unspecified organism: Secondary | ICD-10-CM

## 2023-10-16 DIAGNOSIS — E8729 Other acidosis: Secondary | ICD-10-CM

## 2023-10-16 DIAGNOSIS — J9600 Acute respiratory failure, unspecified whether with hypoxia or hypercapnia: Secondary | ICD-10-CM | POA: Diagnosis present

## 2023-10-16 DIAGNOSIS — I161 Hypertensive emergency: Secondary | ICD-10-CM

## 2023-10-16 DIAGNOSIS — J441 Chronic obstructive pulmonary disease with (acute) exacerbation: Secondary | ICD-10-CM

## 2023-10-16 DIAGNOSIS — J810 Acute pulmonary edema: Secondary | ICD-10-CM

## 2023-10-16 DIAGNOSIS — R0603 Acute respiratory distress: Principal | ICD-10-CM

## 2023-10-16 DIAGNOSIS — A419 Sepsis, unspecified organism: Secondary | ICD-10-CM

## 2023-10-16 DIAGNOSIS — R6510 Systemic inflammatory response syndrome (SIRS) of non-infectious origin without acute organ dysfunction: Secondary | ICD-10-CM

## 2023-10-16 DIAGNOSIS — I214 Non-ST elevation (NSTEMI) myocardial infarction: Secondary | ICD-10-CM

## 2023-10-16 DIAGNOSIS — Z7982 Long term (current) use of aspirin: Secondary | ICD-10-CM | POA: Diagnosis not present

## 2023-10-16 DIAGNOSIS — I10 Essential (primary) hypertension: Secondary | ICD-10-CM | POA: Diagnosis present

## 2023-10-16 DIAGNOSIS — Z7951 Long term (current) use of inhaled steroids: Secondary | ICD-10-CM | POA: Diagnosis not present

## 2023-10-16 DIAGNOSIS — R7303 Prediabetes: Secondary | ICD-10-CM | POA: Diagnosis present

## 2023-10-16 DIAGNOSIS — Z880 Allergy status to penicillin: Secondary | ICD-10-CM

## 2023-10-16 DIAGNOSIS — E8770 Fluid overload, unspecified: Secondary | ICD-10-CM | POA: Diagnosis present

## 2023-10-16 DIAGNOSIS — I272 Pulmonary hypertension, unspecified: Secondary | ICD-10-CM | POA: Diagnosis present

## 2023-10-16 DIAGNOSIS — J9602 Acute respiratory failure with hypercapnia: Secondary | ICD-10-CM | POA: Diagnosis not present

## 2023-10-16 DIAGNOSIS — R0602 Shortness of breath: Secondary | ICD-10-CM | POA: Diagnosis present

## 2023-10-16 DIAGNOSIS — Z91148 Patient's other noncompliance with medication regimen for other reason: Secondary | ICD-10-CM | POA: Diagnosis not present

## 2023-10-16 DIAGNOSIS — Z1152 Encounter for screening for COVID-19: Secondary | ICD-10-CM | POA: Diagnosis not present

## 2023-10-16 DIAGNOSIS — Z79899 Other long term (current) drug therapy: Secondary | ICD-10-CM | POA: Diagnosis not present

## 2023-10-16 DIAGNOSIS — J9601 Acute respiratory failure with hypoxia: Secondary | ICD-10-CM | POA: Diagnosis present

## 2023-10-16 DIAGNOSIS — I2489 Other forms of acute ischemic heart disease: Secondary | ICD-10-CM | POA: Diagnosis present

## 2023-10-16 DIAGNOSIS — Z882 Allergy status to sulfonamides status: Secondary | ICD-10-CM | POA: Diagnosis not present

## 2023-10-16 HISTORY — DX: Chronic obstructive pulmonary disease, unspecified: J44.9

## 2023-10-16 LAB — BLOOD GAS, VENOUS
Acid-Base Excess: 1.3 mmol/L (ref 0.0–2.0)
Acid-base deficit: 8.9 mmol/L — ABNORMAL HIGH (ref 0.0–2.0)
Bicarbonate: 24.7 mmol/L (ref 20.0–28.0)
Bicarbonate: 31 mmol/L — ABNORMAL HIGH (ref 20.0–28.0)
Delivery systems: POSITIVE
FIO2: 40 %
O2 Saturation: 90.1 %
O2 Saturation: 40.3 %
PEEP: 5 cmH2O
Patient temperature: 37
Patient temperature: 37
Pressure support: 10 cmH2O
pCO2, Ven: 100 mm[Hg] (ref 44–60)
pCO2, Ven: 74 mm[Hg] (ref 44–60)
pH, Ven: 7 — CL (ref 7.25–7.43)
pH, Ven: 7.23 — ABNORMAL LOW (ref 7.25–7.43)
pO2, Ven: 71 mm[Hg] — ABNORMAL HIGH (ref 32–45)

## 2023-10-16 LAB — CBC WITH DIFFERENTIAL/PLATELET
Abs Immature Granulocytes: 0.41 10*3/uL — ABNORMAL HIGH (ref 0.00–0.07)
Basophils Absolute: 0.1 10*3/uL (ref 0.0–0.1)
Basophils Relative: 1 %
Eosinophils Absolute: 0.2 10*3/uL (ref 0.0–0.5)
Eosinophils Relative: 1 %
HCT: 40.8 % (ref 36.0–46.0)
Hemoglobin: 12.2 g/dL (ref 12.0–15.0)
Immature Granulocytes: 3 %
Lymphocytes Relative: 26 %
Lymphs Abs: 4 10*3/uL (ref 0.7–4.0)
MCH: 27.3 pg (ref 26.0–34.0)
MCHC: 29.9 g/dL — ABNORMAL LOW (ref 30.0–36.0)
MCV: 91.3 fL (ref 80.0–100.0)
Monocytes Absolute: 0.9 10*3/uL (ref 0.1–1.0)
Monocytes Relative: 6 %
Neutro Abs: 9.7 10*3/uL — ABNORMAL HIGH (ref 1.7–7.7)
Neutrophils Relative %: 63 %
Platelets: 445 10*3/uL — ABNORMAL HIGH (ref 150–400)
RBC: 4.47 MIL/uL (ref 3.87–5.11)
RDW: 15.3 % (ref 11.5–15.5)
WBC: 15.2 10*3/uL — ABNORMAL HIGH (ref 4.0–10.5)
nRBC: 0 % (ref 0.0–0.2)

## 2023-10-16 LAB — TSH: TSH: 0.464 u[IU]/mL (ref 0.350–4.500)

## 2023-10-16 LAB — SURGICAL PCR SCREEN
MRSA, PCR: NEGATIVE
Staphylococcus aureus: NEGATIVE

## 2023-10-16 LAB — HIV ANTIBODY (ROUTINE TESTING W REFLEX): HIV Screen 4th Generation wRfx: NONREACTIVE

## 2023-10-16 LAB — COMPREHENSIVE METABOLIC PANEL
ALT: 16 U/L (ref 0–44)
AST: 33 U/L (ref 15–41)
Albumin: 3.5 g/dL (ref 3.5–5.0)
Alkaline Phosphatase: 114 U/L (ref 38–126)
Anion gap: 11 (ref 5–15)
BUN: 14 mg/dL (ref 8–23)
CO2: 23 mmol/L (ref 22–32)
Calcium: 8.7 mg/dL — ABNORMAL LOW (ref 8.9–10.3)
Chloride: 108 mmol/L (ref 98–111)
Creatinine, Ser: 0.87 mg/dL (ref 0.44–1.00)
GFR, Estimated: >60 mL/min (ref >60)
Glucose, Bld: 247 mg/dL — ABNORMAL HIGH (ref 70–99)
Potassium: 5 mmol/L (ref 3.5–5.1)
Sodium: 142 mmol/L (ref 135–145)
Total Bilirubin: 0.5 mg/dL (ref 0.0–1.2)
Total Protein: 6.9 g/dL (ref 6.5–8.1)

## 2023-10-16 LAB — BLOOD GAS, ARTERIAL
Acid-base deficit: 4.2 mmol/L — ABNORMAL HIGH (ref 0.0–2.0)
Bicarbonate: 24.8 mmol/L (ref 20.0–28.0)
Delivery systems: POSITIVE
Expiratory PAP: 5 cm[H2O]
FIO2: 0.4 %
Inspiratory PAP: 10 cm[H2O]
O2 Saturation: 100 %
Patient temperature: 37
pCO2 arterial: 62 mm[Hg] — ABNORMAL HIGH (ref 32–48)
pH, Arterial: 7.21 — ABNORMAL LOW (ref 7.35–7.45)
pO2, Arterial: 132 mm[Hg] — ABNORMAL HIGH (ref 83–108)

## 2023-10-16 LAB — TROPONIN I (HIGH SENSITIVITY)
Troponin I (High Sensitivity): 33 ng/L — ABNORMAL HIGH (ref <18)
Troponin I (High Sensitivity): 111 ng/L (ref <18)
Troponin I (High Sensitivity): 279 ng/L (ref <18)
Troponin I (High Sensitivity): 439 ng/L (ref <18)

## 2023-10-16 LAB — BRAIN NATRIURETIC PEPTIDE: B Natriuretic Peptide: 810.3 pg/mL — ABNORMAL HIGH (ref 0.0–100.0)

## 2023-10-16 LAB — RESP PANEL BY RT-PCR (RSV, FLU A&B, COVID)  RVPGX2
Influenza A by PCR: NEGATIVE
Influenza B by PCR: NEGATIVE
Resp Syncytial Virus by PCR: NEGATIVE
SARS Coronavirus 2 by RT PCR: NEGATIVE

## 2023-10-16 LAB — LACTIC ACID, PLASMA
Lactic Acid, Venous: 3.5 mmol/L (ref 0.5–1.9)
Lactic Acid, Venous: 1.3 mmol/L (ref 0.5–1.9)

## 2023-10-16 LAB — D-DIMER, QUANTITATIVE: D-Dimer, Quant: 1.88 ug{FEU}/mL — ABNORMAL HIGH (ref 0.00–0.50)

## 2023-10-16 LAB — PROCALCITONIN
Procalcitonin: <0.1 ng/mL
Procalcitonin: 2.83 ng/mL

## 2023-10-16 LAB — PROTIME-INR
INR: 1 (ref 0.8–1.2)
Prothrombin Time: 13.6 s (ref 11.4–15.2)

## 2023-10-16 LAB — GLUCOSE, CAPILLARY
Glucose-Capillary: 217 mg/dL — ABNORMAL HIGH (ref 70–99)
Glucose-Capillary: 112 mg/dL — ABNORMAL HIGH (ref 70–99)

## 2023-10-16 MED ORDER — NITROGLYCERIN IN D5W 200-5 MCG/ML-% IV SOLN
0.0000 ug/min | INTRAVENOUS | Status: DC
  Administered 2023-10-16: 25 ug/min via INTRAVENOUS
  Administered 2023-10-16: 15 ug/min via INTRAVENOUS
  Administered 2023-10-16: 5 ug/min via INTRAVENOUS
  Filled 2023-10-16: qty 250

## 2023-10-16 MED ORDER — ROSUVASTATIN CALCIUM 10 MG PO TABS
20.0000 mg | ORAL_TABLET | Freq: Every day | ORAL | Status: DC
  Administered 2023-10-17 – 2023-10-19 (×3): 20 mg via ORAL
  Filled 2023-10-16 (×3): qty 2

## 2023-10-16 MED ORDER — BUDESONIDE 0.25 MG/2ML IN SUSP
0.2500 mg | Freq: Two times a day (BID) | RESPIRATORY_TRACT | Status: DC
  Administered 2023-10-16 – 2023-10-19 (×6): 0.25 mg via RESPIRATORY_TRACT
  Filled 2023-10-16 (×6): qty 2

## 2023-10-16 MED ORDER — VITAMIN D 25 MCG (1000 UNIT) PO TABS
2000.0000 [IU] | ORAL_TABLET | Freq: Every day | ORAL | Status: DC
  Administered 2023-10-17 – 2023-10-19 (×3): 2000 [IU] via ORAL
  Filled 2023-10-16 (×3): qty 2

## 2023-10-16 MED ORDER — ACETAMINOPHEN 650 MG RE SUPP: 650.0000 mg | Freq: Four times a day (QID) | RECTAL | Status: DC | PRN

## 2023-10-16 MED ORDER — PREDNISONE 20 MG PO TABS
40.0000 mg | ORAL_TABLET | Freq: Every day | ORAL | Status: DC
  Administered 2023-10-17 – 2023-10-19 (×3): 40 mg via ORAL
  Filled 2023-10-16 (×3): qty 2

## 2023-10-16 MED ORDER — ESCITALOPRAM OXALATE 10 MG PO TABS
10.0000 mg | ORAL_TABLET | Freq: Every day | ORAL | Status: DC
  Administered 2023-10-17 – 2023-10-19 (×3): 10 mg via ORAL
  Filled 2023-10-16 (×3): qty 1

## 2023-10-16 MED ORDER — IOHEXOL 350 MG/ML SOLN
100.0000 mL | Freq: Once | INTRAVENOUS | Status: AC | PRN
  Administered 2023-10-16: 100 mL via INTRAVENOUS

## 2023-10-16 MED ORDER — SODIUM CHLORIDE 0.9 % IV SOLN
1.0000 g | Freq: Once | INTRAVENOUS | Status: AC
  Administered 2023-10-16: 1 g via INTRAVENOUS
  Filled 2023-10-16: qty 10

## 2023-10-16 MED ORDER — CHLORTHALIDONE 25 MG PO TABS
12.5000 mg | ORAL_TABLET | Freq: Every day | ORAL | Status: DC
  Administered 2023-10-16 – 2023-10-19 (×4): 12.5 mg via ORAL
  Filled 2023-10-16 (×2): qty 1
  Filled 2023-10-16: qty 0.5
  Filled 2023-10-16: qty 1

## 2023-10-16 MED ORDER — METHYLPREDNISOLONE SODIUM SUCC 125 MG IJ SOLR
125.0000 mg | Freq: Once | INTRAMUSCULAR | Status: AC
  Administered 2023-10-16: 125 mg via INTRAVENOUS
  Filled 2023-10-16: qty 2

## 2023-10-16 MED ORDER — SODIUM CHLORIDE 0.9 % IV SOLN
500.0000 mg | INTRAVENOUS | Status: DC
  Administered 2023-10-16 – 2023-10-17 (×2): 500 mg via INTRAVENOUS
  Filled 2023-10-16 (×2): qty 5

## 2023-10-16 MED ORDER — ACETAMINOPHEN 325 MG PO TABS
650.0000 mg | ORAL_TABLET | Freq: Four times a day (QID) | ORAL | Status: DC | PRN
  Administered 2023-10-18 (×3): 650 mg via ORAL
  Filled 2023-10-16 (×3): qty 2

## 2023-10-16 MED ORDER — DOXYCYCLINE HYCLATE 100 MG IV SOLR
100.0000 mg | Freq: Once | INTRAVENOUS | Status: AC
  Administered 2023-10-16: 100 mg via INTRAVENOUS
  Filled 2023-10-16: qty 100

## 2023-10-16 MED ORDER — CEFTRIAXONE SODIUM 1 G IJ SOLR
1.0000 g | INTRAMUSCULAR | Status: DC
  Administered 2023-10-17 – 2023-10-18 (×2): 1 g via INTRAVENOUS
  Filled 2023-10-16 (×2): qty 10

## 2023-10-16 MED ORDER — ONDANSETRON HCL 4 MG/2ML IJ SOLN: 4.0000 mg | Freq: Four times a day (QID) | INTRAMUSCULAR | Status: DC | PRN

## 2023-10-16 MED ORDER — AMLODIPINE BESYLATE 10 MG PO TABS
10.0000 mg | ORAL_TABLET | Freq: Every day | ORAL | Status: DC
  Administered 2023-10-16 – 2023-10-19 (×4): 10 mg via ORAL
  Filled 2023-10-16 (×4): qty 1

## 2023-10-16 MED ORDER — ASPIRIN 300 MG RE SUPP
300.0000 mg | Freq: Once | RECTAL | Status: DC
  Filled 2023-10-16: qty 1

## 2023-10-16 MED ORDER — IPRATROPIUM-ALBUTEROL 0.5-2.5 (3) MG/3ML IN SOLN
3.0000 mL | Freq: Once | RESPIRATORY_TRACT | Status: AC
  Administered 2023-10-16: 3 mL via RESPIRATORY_TRACT
  Filled 2023-10-16: qty 3

## 2023-10-16 MED ORDER — MORPHINE SULFATE (PF) 2 MG/ML IV SOLN
2.0000 mg | Freq: Once | INTRAVENOUS | Status: AC
  Administered 2023-10-16: 2 mg via INTRAVENOUS
  Filled 2023-10-16: qty 1

## 2023-10-16 MED ORDER — IPRATROPIUM-ALBUTEROL 0.5-2.5 (3) MG/3ML IN SOLN
3.0000 mL | Freq: Four times a day (QID) | RESPIRATORY_TRACT | Status: DC
  Administered 2023-10-16 – 2023-10-17 (×5): 3 mL via RESPIRATORY_TRACT
  Filled 2023-10-16 (×5): qty 3

## 2023-10-16 MED ORDER — ENOXAPARIN SODIUM 40 MG/0.4ML IJ SOSY
40.0000 mg | PREFILLED_SYRINGE | INTRAMUSCULAR | Status: DC
  Filled 2023-10-16 (×2): qty 0.4

## 2023-10-16 MED ORDER — NITROGLYCERIN 0.4 MG SL SUBL
0.4000 mg | SUBLINGUAL_TABLET | SUBLINGUAL | Status: DC | PRN
  Administered 2023-10-16: 0.4 mg via SUBLINGUAL
  Filled 2023-10-16: qty 1

## 2023-10-16 MED ORDER — ASPIRIN 81 MG PO CHEW
81.0000 mg | CHEWABLE_TABLET | Freq: Every day | ORAL | Status: DC
  Administered 2023-10-17 – 2023-10-19 (×3): 81 mg via ORAL
  Filled 2023-10-16 (×3): qty 1

## 2023-10-16 MED ORDER — IPRATROPIUM-ALBUTEROL 0.5-2.5 (3) MG/3ML IN SOLN
RESPIRATORY_TRACT | Status: AC
  Filled 2023-10-16: qty 9

## 2023-10-16 MED ORDER — FAMOTIDINE 20 MG PO TABS
20.0000 mg | ORAL_TABLET | Freq: Two times a day (BID) | ORAL | Status: DC
Start: 2023-10-16 — End: 2023-10-19
  Administered 2023-10-16 – 2023-10-19 (×6): 20 mg via ORAL
  Filled 2023-10-16 (×6): qty 1

## 2023-10-16 MED ORDER — ONDANSETRON HCL 4 MG PO TABS
4.0000 mg | ORAL_TABLET | Freq: Four times a day (QID) | ORAL | Status: DC | PRN
Start: 2023-10-16 — End: 2023-10-19

## 2023-10-16 MED ORDER — FUROSEMIDE 10 MG/ML IJ SOLN
40.0000 mg | Freq: Once | INTRAMUSCULAR | Status: AC
Start: 2023-10-16 — End: 2023-10-16
  Administered 2023-10-16: 40 mg via INTRAVENOUS
  Filled 2023-10-16: qty 4

## 2023-10-16 MED ORDER — PREGABALIN 75 MG PO CAPS
150.0000 mg | ORAL_CAPSULE | Freq: Two times a day (BID) | ORAL | Status: DC
Start: 2023-10-16 — End: 2023-10-19
  Administered 2023-10-16 – 2023-10-19 (×6): 150 mg via ORAL
  Filled 2023-10-16 (×6): qty 2

## 2023-10-16 NOTE — Progress Notes (Signed)
   10/16/23 1500  Spiritual Encounters  Type of Visit Follow up  Care provided to: Pt and family  Conversation partners present during encounter Nurse  Referral source Chaplain assessment  Reason for visit Routine spiritual support  OnCall Visit No  Spiritual Framework  Presenting Themes Coping tools;Caregiving needs   Chaplain went to follow up with patient and family. Patient was alert and talking when chaplain came. Family was grateful for the visit and the support. Chaplain services will remain available for spiritual and emotional support.

## 2023-10-16 NOTE — Assessment & Plan Note (Signed)
 Mild elevation of troponin from 33-1 10, most consistent with demand ischemia in the setting of respiratory failure.  Will continue to trend troponin to peak.  If marked elevations are noted, will start heparin  at that time.  - Troponin pending - Telemetry monitoring

## 2023-10-16 NOTE — Assessment & Plan Note (Signed)
 On presentation, patient was noted to be tachycardic, tachypneic, with leukocytosis concerning for SIRS/sepsis.  Although chest x-ray is being read as a possible infiltrate in the right upper lobe, no other signs of infection at this time.  Patient is afebrile with no reported cough, rhinorrhea, congestion, nausea, vomiting, diarrhea.  Suspect noninfectious etiology at this time  - Respiratory viral panel pending - CAP coverage as noted above - Blood cultures obtained

## 2023-10-16 NOTE — H&P (Addendum)
 History and Physical    Patient: Cindy Gutierrez FMW:[id number] DOB: 1961-05-17 DOA: 10/16/2023 DOS: the patient was seen and examined on 10/16/2023 PCP: Patient, No Pcp Per  Patient coming from: Home  Chief Complaint: No chief complaint on file.  HPI: Cindy Gutierrez is a 63 y.o. female with medical history significant of Hypertension, COPD, prediabetes, carotid artery disease, who presents to the ED due to unresponsiveness.  Cindy Gutierrez states that Cindy Gutierrez remembers earlier this morning Cindy Gutierrez went to use the restroom and had noted sudden onset shortness of breath.  Cindy Gutierrez is set up with nebulizer treatment and this did not help her breathing at all.  Cindy Gutierrez asked her son to call 911.  Cindy Gutierrez does not remember anything after this.  Her son at bedside states that they went into the room to check on her and noted that Cindy Gutierrez was passed out not responding to them.  Cindy Gutierrez denies any recent shortness of breath prior to today, chest pain, palpitations.  Cindy Gutierrez notes that Cindy Gutierrez has chronic orthopnea that has been ongoing for years as well as bilateral lower extremity swelling.  Cindy Gutierrez denies any recent fever, chills, cough.  ED course: On arrival to the ED, patient was saturating at 99% on BiPAP.  Cindy Gutierrez was hypertensive at 199/131 with heart rate of 189.  Cindy Gutierrez was afebrile at 97.8.  Cindy Gutierrez is tachypneic at 29.  Initial workup notable for WBC of 15.2, glucose 247, creatinine 0.87 with GFR above 60.  BNP 810 with troponin of 33.  Lactic acid of 3.5.  Troponin increased to 111, lactic acid normalized.  COVID-19, influenza and RSV PCR negative.  Chest x-ray was obtained that demonstrated right upper lobe infiltrate.  Patient started on Rocephin , doxycycline , Lasix , DuoNebs, Solu-Medrol  and nitroglycerin  infusion.  TRH contacted for admission.   Review of Systems: As mentioned in the history of present illness. All other systems reviewed and are negative.  Past Medical History:  Diagnosis Date   COPD (chronic obstructive pulmonary disease)  (HCC)    History reviewed. No pertinent surgical history. Social History:  has no history on file for tobacco use, alcohol use, and drug use.  Allergies  Allergen Reactions   Penicillins    Sulfa Antibiotics     History reviewed. No pertinent family history.  Prior to Admission medications   Medication Sig Start Date End Date Taking? Authorizing Provider  acetaminophen  (TYLENOL ) 325 MG tablet Take 650 mg by mouth 3 (three) times daily as needed for mild pain (pain score 1-3).   Yes [provider]  albuterol  (PROVENTIL ) (2.5 MG/3ML) 0.083% nebulizer solution Take 2.5 mg by nebulization every 4 (four) hours as needed for wheezing or shortness of breath.   Yes [provider]  aspirin  81 MG chewable tablet Chew 81 mg by mouth daily.   Yes [provider]  budesonide -formoterol  (SYMBICORT ) 160-4.5 MCG/ACT inhaler Inhale 2 puffs into the lungs 2 (two) times daily.   Yes [provider]  calcium  carbonate (TUMS - DOSED IN MG ELEMENTAL CALCIUM ) 500 MG chewable tablet Chew 1 tablet by mouth daily.   Yes [provider]  calcium  citrate (CALCITRATE - DOSED IN MG ELEMENTAL CALCIUM ) 950 (200 Ca) MG tablet Take 200 mg of elemental calcium  by mouth daily.   Yes [provider]  celecoxib  (CELEBREX ) 200 MG capsule Take 200 mg by mouth 2 (two) times daily.   Yes [provider]  cholecalciferol  (VITAMIN D3) 25 MCG (1000 UNIT) tablet Take 2,000 Units by mouth daily.  Yes [provider]  escitalopram  (LEXAPRO ) 10 MG tablet Take 10 mg by mouth daily.   Yes [provider]  famotidine  (PEPCID ) 20 MG tablet Take 20 mg by mouth 2 (two) times daily.   Yes [provider]  furosemide  (LASIX ) 20 MG tablet Take 20 mg by mouth daily.   Yes [provider]  pregabalin  (LYRICA ) 150 MG capsule Take 150 mg by mouth 2 (two) times daily.   Yes [provider]  rosuvastatin  (CRESTOR ) 20 MG tablet Take 20 mg by  mouth daily.   Yes [provider]  Tiotropium Bromide Monohydrate (SPIRIVA RESPIMAT) 2.5 MCG/ACT AERS Inhale 2 puffs into the lungs daily.   Yes [provider]    Physical Exam: Vitals:   10/16/23 1430 10/16/23 1500 10/16/23 1515 10/16/23 1550  BP: (!) 149/61 (!) 163/80 (!) 158/76 (!) 154/77  Pulse: (!) 105 100 96   Resp: (!) 26 (!) 28 (!) 24   Temp:    (!) 97.4 F (36.3 C)  TempSrc:    Axillary  SpO2: 100% 100% 100%   Weight:    86 kg  Height:    5' 7 (1.702 m)   Physical Exam Vitals and nursing note reviewed.  HENT:     Head: Normocephalic and atraumatic.     Mouth/Throat:     Mouth: Mucous membranes are dry.  Eyes:     Extraocular Movements: Extraocular movements intact.     Pupils: Pupils are equal, round, and reactive to light.  Cardiovascular:     Rate and Rhythm: Regular rhythm. Tachycardia present.     Heart sounds: No murmur heard.    No gallop.  Pulmonary:     Effort: Tachypnea and accessory muscle usage present.     Breath sounds: Decreased breath sounds (Notably diminished breath sounds bilaterally in the bases) and wheezing (Diffuse expiratory wheezing in the middle and upper lobes bilaterally) present. No rhonchi or rales.  Abdominal:     General: Bowel sounds are normal. There is no distension.     Palpations: Abdomen is soft.     Tenderness: There is no abdominal tenderness. There is no guarding.  Musculoskeletal:     Right lower leg: Edema present.     Left lower leg: Edema present.     Comments: +1 pitting edema bilaterally  Skin:    General: Skin is warm and dry.  Neurological:     General: No focal deficit present.     Mental Status: Cindy Gutierrez is alert and oriented to person, place, and time. Mental status is at baseline.  Psychiatric:        Mood and Affect: Mood normal.        Behavior: Behavior normal.    Data Reviewed: CBC with WBC of 15.2, hemoglobin of 12.2, and platelets of 445 CMP with sodium of 142, potassium 5.0, bicarb  23, glucose 247, creatinine 0.87, AST 33, ALT 16, GFR above 60 Lactic acid 3.5 with improvement to 1.3 BNP 810 Troponin of 33 with increased to 110 INR within normal limits at 1.0 COVID-19, influenza and RSV PCR negative Procalcitonin negative  Initial VBG with pH below 7 and pCO2 of 100 Repeat ABG with pH of 7.21 and pCO2 of 62  EKG obtained at 1127 with sinus tachycardia with rate of 148.  Changes consistent with LVH.  Concern for ST elevation noted, however findings are diffuse.  No PR depression.  DG Chest Port 1 View Result Date: 10/16/2023 CLINICAL DATA:  Shortness  of breath. EXAM: PORTABLE CHEST 1 VIEW COMPARISON:  Chest radiograph dated 09/20/2023. FINDINGS: Right upper lobe reticulonodular densities most consistent with infiltrate. Clinical correlation is recommended. Left lung base atelectasis/scarring. No large pleural effusion. No pneumothorax. Stable cardiac silhouette. No acute osseous pathology. Surgical clips over the neck. IMPRESSION: Right upper lobe infiltrate. Electronically Signed   By: Vanetta Chou M.D.   On: 10/16/2023 11:54   Results are pending, will review when available.  Assessment and Plan:  * Acute respiratory failure (HCC) Patient is presenting with sudden onset shortness of breath with no reported respiratory symptoms over the last few days.  Due to this, primary suspicion at this point is flash pulmonary edema in the setting of severe uncontrolled hypertension.  BNP is markedly elevated 810.  Chest x-ray read as possible right upper lobe infiltrate, however unclear on personal review.  Differential includes COPD exacerbation vs PE.  - Continue BiPAP - Repeat VBG pending - Continue CAP coverage for now pending clinical improvement although procalcitonin is negative - Respiratory viral panel - S/p one-time dose Lasix  40 mg IV - Echocardiogram ordered - Strict in and out - Daily weights - D-dimer pending; if positive, will obtain CTA  Hypertensive  emergency On admission, patient was markedly hyper tensive at 199/131 with concern for pulmonary edema.  Per chart review, patient was previously on chlorthalidone  and amlodipine , however this does not appear to be on her medication list now  - Continue nitroglycerin  infusion with goal SBP of 160. - Wean as tolerated - Restart home amlodipine  and chlorthalidone   SIRS (systemic inflammatory response syndrome) (HCC) On presentation, patient was noted to be tachycardic, tachypneic, with leukocytosis concerning for SIRS/sepsis.  Although chest x-ray is being read as a possible infiltrate in the right upper lobe, no other signs of infection at this time.  Patient is afebrile with no reported cough, rhinorrhea, congestion, nausea, vomiting, diarrhea.  Suspect noninfectious etiology at this time  - Respiratory viral panel pending - CAP coverage as noted above - Blood cultures obtained  NSTEMI (non-ST elevated myocardial infarction) (HCC) Mild elevation of troponin from 33-1 10, most consistent with demand ischemia in the setting of respiratory failure.  Will continue to trend troponin to peak.  If marked elevations are noted, will start heparin  at that time.  - Troponin pending - Telemetry monitoring  COPD with acute exacerbation (HCC) History of significant COPD with multiple visits for exacerbation with most recent admission from 09/18/2023 to 09/21/2023.  Notable wheezing on examination.  - S/p Solu-Medrol  125 mg once - Start prednisone  40 mg tomorrow to complete a 5-day course - DuoNebs every 6 hours - Pulmicort  twice daily  Advance Care Planning:   Code Status: Full Code verified by patient.  Cindy Gutierrez states Cindy Gutierrez would want both pulmonary and cardiac resuscitation if needed.  Cindy Gutierrez is very hesitant about intubation but states Cindy Gutierrez is willing to undergo if that is her last resort  Consults: None  Family Communication: Patient's 2 sons updated at bedside  Severity of Illness: The appropriate  patient status for this patient is INPATIENT. Inpatient status is judged to be reasonable and necessary in order to provide the required intensity of service to ensure the patient's safety. The patient's presenting symptoms, physical exam findings, and initial radiographic and laboratory data in the context of their chronic comorbidities is felt to place them at high risk for further clinical deterioration. Furthermore, it is not anticipated that the patient will be medically stable for discharge from the hospital within  2 midnights of admission.   * I certify that at the point of admission it is my clinical judgment that the patient will require inpatient hospital care spanning beyond 2 midnights from the point of admission due to high intensity of service, high risk for further deterioration and high frequency of surveillance required.*  Author: Clayborne Broom, MD 10/16/2023 5:24 PM  For on call review [email or web address].

## 2023-10-16 NOTE — Progress Notes (Signed)
   10/16/23 1200  Spiritual Encounters  Type of Visit Initial  Care provided to: Pt and family  Conversation partners present during encounter Nurse;Physician  Referral source Nurse (RN/NT/LPN)  Reason for visit Urgent spiritual support  OnCall Visit No  Spiritual Framework  Presenting Themes Meaning/purpose/sources of inspiration;Courage hope and growth;Impactful experiences and emotions  Community/Connection Family;Friend(s)  Patient Stress Factors Health changes;Family relationships  Intervention Outcomes  Outcomes Connection to spiritual care;Awareness around self/spiritual resourses;Reduced isolation;Reduced anxiety   Chaplain received a call from the nurse to come and sit with the patients son. Patient was brought into the ED and is in critical condition. Chaplain spoke with nurses and Dr about diagnosis and plans. Chaplain spoke with family and sat and offered comforting presence and offered words of hope. Chaplain will follow up with family and patient.

## 2023-10-16 NOTE — ED Notes (Signed)
Attempted report. Secretary reports she will have RN call back for report

## 2023-10-16 NOTE — ED Notes (Signed)
Attempted report. Secretary took this RN's name and number for nurse to call back for report.

## 2023-10-16 NOTE — Assessment & Plan Note (Addendum)
 Patient is presenting with sudden onset shortness of breath with no reported respiratory symptoms over the last few days.  Due to this, primary suspicion at this point is flash pulmonary edema in the setting of severe uncontrolled hypertension.  BNP is markedly elevated 810.  Chest x-ray read as possible right upper lobe infiltrate, however unclear on personal review.  Differential includes COPD exacerbation vs PE.  - Continue BiPAP - Repeat VBG pending - Continue CAP coverage for now pending clinical improvement although procalcitonin is negative - Respiratory viral panel - S/p one-time dose Lasix  40 mg IV - Echocardiogram ordered - Strict in and out - Daily weights - D-dimer pending; if positive, will obtain CTA

## 2023-10-16 NOTE — ED Notes (Signed)
Son at bedside.

## 2023-10-16 NOTE — ED Notes (Signed)
Lab reported troponin 111. MD Bing Neighbors made aware

## 2023-10-16 NOTE — ED Triage Notes (Signed)
Patient brought in by EMS from home found by family unresponsive. Upon arrival patient being bagged by EMS. EMS reported 145-150 HR and blood sugar 260.

## 2023-10-16 NOTE — Assessment & Plan Note (Addendum)
 History of significant COPD with multiple visits for exacerbation with most recent admission from 09/18/2023 to 09/21/2023.  Notable wheezing on examination.  - S/p Solu-Medrol  125 mg once - Start prednisone  40 mg tomorrow to complete a 5-day course - DuoNebs every 6 hours - Pulmicort  twice daily

## 2023-10-16 NOTE — Progress Notes (Signed)
   10/16/23 1345  Spiritual Encounters  Type of Visit Follow up  Care provided to: Pt and family  Referral source Chaplain team (On-call chaplain Beth requested a follow-up visit)  Reason for visit Routine spiritual support  OnCall Visit Yes  Interventions  Spiritual Care Interventions Made Established relationship of care and support (Reminded the patient and family that chaplain service is available 24/7)   Requested by Elia Hun to revisit patient and family. Introduced myself and reminded the patient and family we are available 24/7.

## 2023-10-16 NOTE — ED Notes (Signed)
Admitting hospitalist at bedside

## 2023-10-16 NOTE — ED Provider Notes (Addendum)
 Cindy Gutierrez Provider Note    Event Date/Time   First MD Initiated Contact with Patient 10/16/23 1053     (approximate)   History   No chief complaint on file.   HPI  Cindy Gutierrez is a 63 y.o. female with history of COPD, presenting with shortness of breath and unresponsiveness.  Per EMS, son found patient lying in bed, unresponsive.  Had her inhaler treatment in her hand.  Per EMS firefighters got their first and started bagging her.  Their initial pulse ox was 100%.  She is tachycardic, hypertensive.  Fingerstick was ended 200s.  Per son, she was last normal yesterday at 10 PM.  States she went to bed, no obvious illness, no trauma.  States she is not in anticoagulation.  States that she was admitted for respiratory distress and COPD exacerbation last year and refused to be intubated.  On independent review of the patient's chart, she does have history of pulmonary hypertension and AV regurgitation.  Last echo was normal.   Physical Exam   Triage Vital Signs: ED Triage Vitals  Encounter Vitals Group     BP 10/16/23 1051 (!) 199/131     Systolic BP Percentile --      Diastolic BP Percentile --      Pulse Rate 10/16/23 1051 (!) 189     Resp 10/16/23 1051 (!) 27     Temp --      Temp src --      SpO2 10/16/23 1051 99 %     Weight 10/16/23 1058 180 lb (81.6 kg)     Height 10/16/23 1058 5' 7 (1.702 m)     Head Circumference --      Peak Flow --      Pain Score --      Pain Loc --      Pain Education --      Exclude from Growth Chart --     Most recent vital signs: Vitals:   10/16/23 1315 10/16/23 1330  BP: (!) 147/87 (!) 143/72  Pulse: (!) 120 (!) 114  Resp: (!) 28 (!) 26  Temp:    SpO2: 100% 100%     General: Opens eyes to voice. CV:  Cool extremities, tachycardic Resp:  Tachypneic, diminished breath sounds bilaterally Abd:  No distention.  Other:  DP pulses are equal bilaterally, no obvious signs of trauma.  Trace lower extremity edema.   No unilateral calf swelling.   ED Results / Procedures / Treatments   Labs (all labs ordered are listed, but only abnormal results are displayed) Labs Reviewed  COMPREHENSIVE METABOLIC PANEL - Abnormal; Notable for the following components:      Result Value   Glucose, Bld 247 (*)    Calcium  8.7 (*)    All other components within normal limits  LACTIC ACID, PLASMA - Abnormal; Notable for the following components:   Lactic Acid, Venous 3.5 (*)    All other components within normal limits  CBC WITH DIFFERENTIAL/PLATELET - Abnormal; Notable for the following components:   WBC 15.2 (*)    MCHC 29.9 (*)    Platelets 445 (*)    Neutro Abs 9.7 (*)    Abs Immature Granulocytes 0.41 (*)    All other components within normal limits  BRAIN NATRIURETIC PEPTIDE - Abnormal; Notable for the following components:   B Natriuretic Peptide 810.3 (*)    All other components within normal limits  BLOOD GAS, VENOUS - Abnormal; Notable  for the following components:   pH, Ven 7 (*)    pCO2, Ven 100 (*)    pO2, Ven 71 (*)    Acid-base deficit 8.9 (*)    All other components within normal limits  BLOOD GAS, ARTERIAL - Abnormal; Notable for the following components:   pH, Arterial 7.21 (*)    pCO2 arterial 62 (*)    pO2, Arterial 132 (*)    Acid-base deficit 4.2 (*)    All other components within normal limits  TROPONIN I (HIGH SENSITIVITY) - Abnormal; Notable for the following components:   Troponin I (High Sensitivity) 33 (*)    All other components within normal limits  TROPONIN I (HIGH SENSITIVITY) - Abnormal; Notable for the following components:   Troponin I (High Sensitivity) 111 (*)    All other components within normal limits  RESP PANEL BY RT-PCR (RSV, FLU A&B, COVID)  RVPGX2  CULTURE, BLOOD (ROUTINE X 2)  CULTURE, BLOOD (ROUTINE X 2)  LACTIC ACID, PLASMA  PROTIME-INR  URINALYSIS, W/ REFLEX TO CULTURE (INFECTION SUSPECTED)     EKG  .EKG: Sinus tachycardia, lateral ST depressions  that are new compared to prior, no ischemic ST elevation.  Normal QRS.  No obvious ischemic ST elevation.  ST changes are new compared to prior    RADIOLOGY Independent review and interpretation of x-ray, consolidation noted on the right.  No obvious pneumothorax.  Radiology impression noted right upper lobe infiltrate.    PROCEDURES:  Critical Care performed: Yes, see critical care procedure note(s)  Ultrasound ED Echo  Date/Time: 10/16/2023 12:39 PM  Performed by: Waymond Lorelle Cummins, MD Authorized by: Waymond Lorelle Cummins, MD   Procedure details:    Indications: dyspnea     Views: subxiphoid, parasternal long axis view and IVC view     Images: not archived     Limitations:  Body habitus Findings:    Pericardium: no pericardial effusion     LV Function: normal (>50% EF)     RV Diameter: normal     IVC: dilated   Impression:    Impression: probable elevated CVP   Ultrasound ED Thoracic  Date/Time: 10/16/2023 12:39 PM  Performed by: Waymond Lorelle Cummins, MD Authorized by: Waymond Lorelle Cummins, MD   Procedure details:    Indications: dyspnea     Assessment for:  Interstitial syndrome   Left lung pleural:  Visualized   Right lung pleural:  Visualized   Images: not archived     Limitations:  Body habitus Findings:    B-lines noted throughout: identified   Impression:    Impression: pulmonary edema   .Critical Care  Performed by: Waymond Lorelle Cummins, MD Authorized by: Waymond Lorelle Cummins, MD   Critical care provider statement:    Critical care time (minutes):  50   Critical care was necessary to treat or prevent imminent or life-threatening deterioration of the following conditions:  Respiratory failure and sepsis   Critical care was time spent personally by me on the following activities:  Development of treatment plan with patient or surrogate, discussions with consultants, evaluation of patient's response to treatment, examination of patient, ordering and review of laboratory studies, ordering and review of  radiographic studies, ordering and performing treatments and interventions, pulse oximetry, re-evaluation of patient's condition and review of old charts .1-3 Lead EKG Interpretation  Performed by: Waymond Lorelle Cummins, MD Authorized by: Waymond Lorelle Cummins, MD     Interpretation: abnormal     ECG rate assessment: tachycardic  Rhythm: sinus tachycardia   .Ultrasound ED Peripheral IV (Provider)  Date/Time: 10/16/2023 7:51 PM  Performed by: Waymond Lorelle Cummins, MD Authorized by: Waymond Lorelle Cummins, MD   Procedure details:    Indications: multiple failed IV attempts     Skin Prep: chlorhexidine  gluconate     Location: right arm.   Angiocath:  20 G   Bedside Ultrasound Guided: Yes     Images: not archived     Patient tolerated procedure without complications: Yes     Dressing applied: Yes      MEDICATIONS ORDERED IN ED: Medications  nitroGLYCERIN  (NITROSTAT ) SL tablet 0.4 mg (0.4 mg Sublingual Given 10/16/23 1056)  nitroGLYCERIN  50 mg in dextrose  5 % 250 mL (0.2 mg/mL) infusion (20 mcg/min Intravenous Rate/Dose Change 10/16/23 1333)  doxycycline  (VIBRAMYCIN ) 100 mg in dextrose  5 % 250 mL IVPB (100 mg Intravenous New Bag/Given 10/16/23 1243)  aspirin  suppository 300 mg (300 mg Rectal Patient Refused/Not Given 10/16/23 1340)  ipratropium-albuterol  (DUONEB) 0.5-2.5 (3) MG/3ML nebulizer solution (  Given by Other 10/16/23 1056)  furosemide  (LASIX ) injection 40 mg (40 mg Intravenous Given 10/16/23 1136)  methylPREDNISolone  sodium succinate (SOLU-MEDROL ) 125 mg/2 mL injection 125 mg (125 mg Intravenous Given 10/16/23 1148)  cefTRIAXone  (ROCEPHIN ) 1 g in sodium chloride  0.9 % 100 mL IVPB (0 g Intravenous Stopped 10/16/23 1240)  ipratropium-albuterol  (DUONEB) 0.5-2.5 (3) MG/3ML nebulizer solution 3 mL (3 mLs Nebulization Given 10/16/23 1301)  ipratropium-albuterol  (DUONEB) 0.5-2.5 (3) MG/3ML nebulizer solution 3 mL (3 mLs Nebulization Given 10/16/23 1301)     IMPRESSION / MDM / ASSESSMENT AND PLAN / ED COURSE  I reviewed  the triage vital signs and the nursing notes.                              Differential diagnosis includes, but is not limited to, COPD exacerbation, pneumonia, viral illness, COVID, influenza, RSV, new onset CHF, pulmonary edema, hypertensive emergency, atypical ACS, considered PE the patient had no recent travel or surgeries.  No unilateral calf swelling or tenderness.  Patient's presentation is most consistent with acute presentation with potential threat to life or bodily function.  Patient history of COPD, pulmonary hypertension, AV regurgitation presenting with decreased responsiveness and respiratory distress.  Placed on BiPAP initially since she is breathing spontaneously.  Satting mid 90s.  Will treat her COPD exacerbation with DuoNebs, Solu-Medrol , initial antibiotics of ceftriaxone  and azithromycin .  No reported history of CHF but given tachycardia, hypertension, also considered new onset CHF with flash pulm edema as well as hypertensive emergency.  Bedside ultrasound showed normal EF, plethoric IVC and bilateral B-lines.  Will treat as hypertensive emergency with pulmonary edema and start her on nitro drip as well as give her some Lasix .  On repeat reassessment, patient is now more lucid, following commands, she is DNI, discussed the risks and benefits of it and she understands that the risks of declining intubation includes decompensation and death.  Patient is still adamantly against intubation despite the risk.  Had a shared discussion with her sons as well and they understand how seriously ill their mom is as well as how the options are limited if she fails BiPAP but remains DNI.  Shared all findings with ICU as well as hospitalist who is agreeable plan for admission and will evaluate the patient.  Independent review of labs, troponin increased compared to prior, suspect this is due to demand but will give for rectal aspirin .  Patient  declined rectal aspirin .  Venous blood gas initially  was pH of 7 with pCO2 of 100, repeat ABG after BiPAP pH now 7.2, pCO2 is in the 60s.  Lactate also improving compared to prior.  Her COVID, influenza and RSV are all negative.  Electrolytes are not severely deranged.  Her BNP is mildly elevated, she has a leukocytosis.  Clinical Course as of 10/16/23 1406  Mon Oct 16, 2023  1147 Patient presenting with decreased breath sounds, responsiveness.  Was bagged by EMS but moving her over she is breathing spontaneously.  Presents are diminished bilaterally, she has trace pulmonary edema bilaterally.  Discussed with sons who said that patient had refused intubation last year when she was admitted for COPD and respiratory distress. [TT]  1151 Blood gas, venous(!!) Respiratory acidosis likely 2/2 COPD exacerbation, retention. Pt now moving all 4 extremities and sitting up on BIPAP. Will continue to trial BIPAP. [TT]  1207 Pt starting to pull of mask, was able to redirect, is following commands. Had shared decision making about intubation but pt declines. Understands the risk of declining intubation, including decompensation and death. Will continue her on BIPAP. Family at bedside and updated.  [TT]  1252 Lactic Acid, Venous(!!): 3.5 Noted lab.  Holding off additional fluids for now given that patient is volume overloaded per ultrasound. [TT]  1255 Paged ICU twice, called APP number but unable to connect. Will continue to page. Sent secure chat.  [TT]  1303 Consulted Dr. Askar in the ICU via phone, states since patient is DO NOT INTUBATE and is improving, no indication for ICU at this time, she can go to stepdown unit.  States that if hospitalist is a comfortable plan, they can contact him to discuss further. [TT]  1330 Blood gas, arterial(!) Improving pH and PCO2 compared to prior VBG. [TT]  1332 Lactic Acid, Venous: 1.3 Downtrending compared to prior.  [TT]  1332 Troponin I (High Sensitivity)(!!): 111 Increased compared to prior, likely demand from  hypertensive emergency but will give a dose of aspirin . [TT]  1405 Patient refused aspirin . [TT]  1406 Discussed with hospitalist who is agreeable plan for admission will evaluate the patient. [TT]    Clinical Course User Index [TT] Waymond Lorelle Cummins, MD     FINAL CLINICAL IMPRESSION(S) / ED DIAGNOSES   Final diagnoses:  Respiratory distress  Respiratory acidosis  COPD exacerbation (HCC)  Sepsis, due to unspecified organism, unspecified whether acute organ dysfunction present [facility])  Pneumonia of right upper lobe due to infectious organism  Hypertensive emergency  Acute pulmonary edema (HCC)     Rx / DC Orders   ED Discharge Orders     None        Note:  This document was prepared using Dragon voice recognition software and may include unintentional dictation errors.    Waymond Lorelle Cummins, MD 10/16/23 1358    Waymond Lorelle Cummins, MD 10/16/23 [id number]

## 2023-10-16 NOTE — Assessment & Plan Note (Signed)
 On admission, patient was markedly hyper tensive at 199/131 with concern for pulmonary edema.  Per chart review, patient was previously on chlorthalidone  and amlodipine , however this does not appear to be on her medication list now  - Continue nitroglycerin  infusion with goal SBP of 160. - Wean as tolerated - Restart home amlodipine  and chlorthalidone 

## 2023-10-17 ENCOUNTER — Inpatient Hospital Stay (HOSPITAL_COMMUNITY)
Admit: 2023-10-17 | Discharge: 2023-10-17 | Disposition: A | Discharge status: Home / Self Care | Payer: No Typology Code available for payment source | Attending: Internal Medicine | Admitting: Internal Medicine

## 2023-10-17 DIAGNOSIS — J9601 Acute respiratory failure with hypoxia: Secondary | ICD-10-CM

## 2023-10-17 DIAGNOSIS — R0603 Acute respiratory distress: Secondary | ICD-10-CM | POA: Diagnosis not present

## 2023-10-17 DIAGNOSIS — J9602 Acute respiratory failure with hypercapnia: Secondary | ICD-10-CM | POA: Diagnosis not present

## 2023-10-17 LAB — ECHOCARDIOGRAM COMPLETE
AR max vel: 1.9 cm2
AV Area VTI: 2.03 cm2
AV Area mean vel: 1.95 cm2
AV Mean grad: 8 mm[Hg]
AV Peak grad: 14.6 mm[Hg]
Ao pk vel: 1.91 m/s
Area-P 1/2: 4.15 cm2
Height: 67 in
MV M vel: 4.77 m/s
MV Peak grad: 91 mm[Hg]
P 1/2 time: 266 ms
S' Lateral: 3.2 cm
Weight: 3026.47 [oz_av]

## 2023-10-17 LAB — BASIC METABOLIC PANEL
Anion gap: 11 (ref 5–15)
BUN: 19 mg/dL (ref 8–23)
CO2: 26 mmol/L (ref 22–32)
Calcium: 8.7 mg/dL — ABNORMAL LOW (ref 8.9–10.3)
Chloride: 106 mmol/L (ref 98–111)
Creatinine, Ser: 0.86 mg/dL (ref 0.44–1.00)
GFR, Estimated: >60 mL/min (ref >60)
Glucose, Bld: 128 mg/dL — ABNORMAL HIGH (ref 70–99)
Potassium: 4.2 mmol/L (ref 3.5–5.1)
Sodium: 143 mmol/L (ref 135–145)

## 2023-10-17 LAB — TROPONIN I (HIGH SENSITIVITY)
Troponin I (High Sensitivity): 527 ng/L (ref <18)
Troponin I (High Sensitivity): 537 ng/L (ref <18)
Troponin I (High Sensitivity): 455 ng/L (ref <18)

## 2023-10-17 LAB — CBC WITH DIFFERENTIAL/PLATELET
Abs Immature Granulocytes: 0.04 10*3/uL (ref 0.00–0.07)
Basophils Absolute: 0 10*3/uL (ref 0.0–0.1)
Basophils Relative: 0 %
Eosinophils Absolute: 0 10*3/uL (ref 0.0–0.5)
Eosinophils Relative: 0 %
HCT: 33.9 % — ABNORMAL LOW (ref 36.0–46.0)
Hemoglobin: 11 g/dL — ABNORMAL LOW (ref 12.0–15.0)
Immature Granulocytes: 0 %
Lymphocytes Relative: 7 %
Lymphs Abs: 0.8 10*3/uL (ref 0.7–4.0)
MCH: 27.5 pg (ref 26.0–34.0)
MCHC: 32.4 g/dL (ref 30.0–36.0)
MCV: 84.8 fL (ref 80.0–100.0)
Monocytes Absolute: 0.2 10*3/uL (ref 0.1–1.0)
Monocytes Relative: 2 %
Neutro Abs: 9.3 10*3/uL — ABNORMAL HIGH (ref 1.7–7.7)
Neutrophils Relative %: 91 %
Platelets: 318 10*3/uL (ref 150–400)
RBC: 4 MIL/uL (ref 3.87–5.11)
RDW: 15 % (ref 11.5–15.5)
WBC: 10.3 10*3/uL (ref 4.0–10.5)
nRBC: 0 % (ref 0.0–0.2)

## 2023-10-17 LAB — RESPIRATORY PANEL BY PCR

## 2023-10-17 MED ORDER — DICLOFENAC SODIUM 25 MG PO TBEC
50.0000 mg | DELAYED_RELEASE_TABLET | Freq: Once | ORAL | Status: AC
  Administered 2023-10-17: 50 mg via ORAL
  Filled 2023-10-17: qty 2

## 2023-10-17 MED ORDER — METHOCARBAMOL 500 MG PO TABS
500.0000 mg | ORAL_TABLET | Freq: Three times a day (TID) | ORAL | Status: DC
  Administered 2023-10-17 – 2023-10-19 (×7): 500 mg via ORAL
  Filled 2023-10-17 (×7): qty 1

## 2023-10-17 MED ORDER — CHLORHEXIDINE GLUCONATE CLOTH 2 % EX PADS
6.0000 | MEDICATED_PAD | Freq: Every day | CUTANEOUS | Status: DC
  Administered 2023-10-17 – 2023-10-19 (×3): 6 via TOPICAL

## 2023-10-17 MED ORDER — CELECOXIB 200 MG PO CAPS
200.0000 mg | ORAL_CAPSULE | Freq: Two times a day (BID) | ORAL | Status: DC
  Administered 2023-10-17 – 2023-10-19 (×5): 200 mg via ORAL
  Filled 2023-10-17 (×5): qty 1

## 2023-10-17 MED ORDER — MORPHINE SULFATE (PF) 2 MG/ML IV SOLN: 2.0000 mg | INTRAVENOUS | Status: DC | PRN

## 2023-10-17 MED ORDER — OXYCODONE HCL 5 MG PO TABS
5.0000 mg | ORAL_TABLET | Freq: Four times a day (QID) | ORAL | Status: DC | PRN
  Filled 2023-10-17: qty 2

## 2023-10-17 MED ORDER — MORPHINE SULFATE (PF) 2 MG/ML IV SOLN
2.0000 mg | INTRAVENOUS | Status: DC | PRN
  Administered 2023-10-17: 2 mg via INTRAVENOUS
  Filled 2023-10-17: qty 1

## 2023-10-17 MED ORDER — ORAL CARE MOUTH RINSE: 15.0000 mL | OROMUCOSAL | Status: DC | PRN

## 2023-10-17 MED ORDER — FUROSEMIDE 20 MG PO TABS
20.0000 mg | ORAL_TABLET | Freq: Every day | ORAL | Status: DC
  Administered 2023-10-17 – 2023-10-18 (×2): 20 mg via ORAL
  Filled 2023-10-17 (×2): qty 1

## 2023-10-17 NOTE — Progress Notes (Addendum)
Made Dr. Georgeann Oppenheim aware patient is refusing lovenox shot. Aslo refused SCD

## 2023-10-17 NOTE — Progress Notes (Signed)
Made Dr. Georgeann Oppenheim aware troponin trending up, currently 537. No chest pain per patient

## 2023-10-17 NOTE — Progress Notes (Signed)
 PROGRESS NOTE    Cindy Gutierrez  FMW:[id number] DOB: Jan 28, 1961 DOA: 10/16/2023 PCP: Patient, No Pcp Per    Brief Narrative:  63 y.o. female with medical history significant of Hypertension, COPD, prediabetes, carotid artery disease, who presents to the ED due to unresponsiveness.   Ms. Kerman states that she remembers earlier this morning she went to use the restroom and had noted sudden onset shortness of breath.  She is set up with nebulizer treatment and this did not help her breathing at all.  She asked her son to call 911.  She does not remember anything after this.   Her son at bedside states that they went into the room to check on her and noted that she was passed out not responding to them.   Ms. Jurich denies any recent shortness of breath prior to today, chest pain, palpitations.  She notes that she has chronic orthopnea that has been ongoing for years as well as bilateral lower extremity swelling.  She denies any recent fever, chills, cough.   Assessment & Plan:   Principal Problem:   Acute respiratory failure (HCC) Active Problems:   Hypertensive emergency   SIRS (systemic inflammatory response syndrome) (HCC)   NSTEMI (non-ST elevated myocardial infarction) (HCC)   COPD with acute exacerbation (HCC)   Acute respiratory failure (HCC) Patient is presenting with sudden onset shortness of breath with no reported respiratory symptoms over the last few days.  Due to this, primary suspicion at this point is flash pulmonary edema in the setting of severe uncontrolled hypertension.  BNP is markedly elevated 810.  Chest x-ray read as possible right upper lobe infiltrate, however unclear on personal review.  Differential includes COPD exacerbation vs PE. Patient was on NIPPV and respiratory status is markedly improved.  On nasal cannula at 2 L/min as of 1/14 Plan: Patient received one-time dose of Lasix  IV We will restart home Lasix  20 mg daily p.o. Follow-up 2D echocardiogram Strict  ins and outs, daily weights Okay to transfer to progressive  Hypertensive emergency On admission, patient was markedly hypertensive at 199/131 with concern for pulmonary edema.  Per chart review, patient was previously on chlorthalidone  and amlodipine , however this does not appear to be on her medication list now Plan: Nitroglycerin  infusion weaned off Home amlodipine  and chlorthalidone  restarted    SIRS (systemic inflammatory response syndrome) (HCC) On presentation, patient was noted to be tachycardic, tachypneic, with leukocytosis concerning for SIRS/sepsis.  Although chest x-ray is being read as a possible infiltrate in the right upper lobe, no other signs of infection at this time.  Patient is afebrile with no reported cough, rhinorrhea, congestion, nausea, vomiting, diarrhea.  Suspect noninfectious etiology at this time.  Respiratory viral panel negative Plan: Continue CAP coverage for now.  De-escalate as appropriate   NSTEMI (non-ST elevated myocardial infarction) (HCC), ruled out Mild elevation of troponin from 33-1 10, most consistent with demand ischemia in the setting of respiratory failure.  No again delta to suggest ACS.  No chest pain Plan: Can stop trending troponins Continue telemetry monitor   COPD with acute exacerbation (HCC) History of significant COPD with multiple visits for exacerbation with most recent admission from 09/18/2023 to 09/21/2023.  Notable wheezing on examination. Plan: - S/p Solu-Medrol  125 mg once - Start prednisone  40 mg tomorrow to complete a 5-day course - DuoNebs every 6 hours - Pulmicort  twice daily   DVT prophylaxis: SQ Lovenox  (patient refused, also refused SCDs) Code Status: Full Family Communication: None Disposition Plan: Status  is: Inpatient Remains inpatient appropriate because: Multiple acute issues as above   Level of care: Progressive  Consultants:  None  Procedures:   None  Antimicrobials: Ceftriaxone  Azithromycin    Subjective: Seen and examined.  Sitting up in bed.  Reports shortness of breath is markedly improved.  Mentating clearly.  Complains of neuropathic pain in right arm.  Objective: Vitals:   10/17/23 1500 10/17/23 1600 10/17/23 1700 10/17/23 1800  BP: (!) 149/61 (!) 148/63 (!) 159/65 (!) 153/69  Pulse: 80 73 77 77  Resp: 17 19 (!) 21 16  Temp:  99.1 F (37.3 C)    TempSrc:  Oral    SpO2: 96% 98% 99% 96%  Weight:      Height:        Intake/Output Summary (Last 24 hours) at 10/17/2023 1804 Last data filed at 10/17/2023 1627 Gross per 24 hour  Intake 841.11 ml  Output 1650 ml  Net -808.89 ml   Filed Weights   10/16/23 1058 10/16/23 1550 10/17/23 0353  Weight: 81.6 kg 86 kg 85.8 kg    Examination:  General exam: Appears calm and comfortable  Respiratory system: Bibasilar crackles.  Normal work of breathing.  2 L Cardiovascular system: S1 & S2 heard, RRR. No JVD, murmurs, rubs, gallops or clicks. No pedal edema. Gastrointestinal system: Soft, NT/ND, normal bowel sounds Central nervous system: Alert and oriented. No focal neurological deficits. Extremities: Symmetric 5 x 5 power. Skin: No rashes, lesions or ulcers Psychiatry: Judgement and insight appear normal. Mood & affect appropriate.     Data Reviewed: I have personally reviewed following labs and imaging studies  CBC: Recent Labs  Lab 10/16/23 1053 10/17/23 0714  WBC 15.2* 10.3  NEUTROABS 9.7* 9.3*  HGB 12.2 11.0*  HCT 40.8 33.9*  MCV 91.3 84.8  PLT 445* 318   Basic Metabolic Panel: Recent Labs  Lab 10/16/23 1053 10/17/23 0714  NA 142 143  K 5.0 4.2  CL 108 106  CO2 23 26  GLUCOSE 247* 128*  BUN 14 19  CREATININE 0.87 0.86  CALCIUM  8.7* 8.7*   GFR: Estimated Creatinine Clearance: 75.4 mL/min (by C-G formula based on SCr of 0.86 mg/dL). Liver Function Tests: Recent Labs  Lab 10/16/23 1053  AST 33  ALT 16  ALKPHOS 114  BILITOT 0.5  PROT  6.9  ALBUMIN 3.5   No results for input(s): LIPASE, AMYLASE in the last 168 hours. No results for input(s): AMMONIA in the last 168 hours. Coagulation Profile: Recent Labs  Lab 10/16/23 1053  INR 1.0   Cardiac Enzymes: No results for input(s): CKTOTAL, CKMB, CKMBINDEX, TROPONINI in the last 168 hours. BNP (last 3 results) No results for input(s): PROBNP in the last 8760 hours. HbA1C: No results for input(s): HGBA1C in the last 72 hours. CBG: Recent Labs  Lab 10/16/23 1046 10/16/23 1545  GLUCAP 217* 112*   Lipid Profile: No results for input(s): CHOL, HDL, LDLCALC, TRIG, CHOLHDL, LDLDIRECT in the last 72 hours. Thyroid  Function Tests: Recent Labs    10/16/23 1621  TSH 0.464   Anemia Panel: No results for input(s): VITAMINB12, FOLATE, FERRITIN, TIBC, IRON , RETICCTPCT in the last 72 hours. Sepsis Labs: Recent Labs  Lab 10/16/23 1053 10/16/23 1249 10/16/23 1621  PROCALCITON  --  <0.10 2.83  LATICACIDVEN 3.5* 1.3  --     Recent Results (from the past 240 hours)  Culture, blood (Routine x 2)     Status: None (Preliminary result)   Collection Time: 10/16/23 11:20 AM  Specimen: BLOOD  Result Value Ref Range Status   Specimen Description BLOOD L HAND  Final   Special Requests   Final    BOTTLES DRAWN AEROBIC AND ANAEROBIC Blood Culture results may not be optimal due to an inadequate volume of blood received in culture bottles   Culture   Final    NO GROWTH < 24 HOURS Performed at [facility], [address] [place], [place] [address]    Report Status PENDING  Incomplete  Resp panel by RT-PCR (RSV, Flu A&B, Covid) Anterior Nasal Swab     Status: None   Collection Time: 10/16/23 12:49 PM   Specimen: Anterior Nasal Swab  Result Value Ref Range Status   SARS Coronavirus 2 by RT PCR NEGATIVE NEGATIVE Final    Comment: (NOTE) SARS-CoV-2 target nucleic acids are NOT DETECTED.  The SARS-CoV-2 RNA is generally  detectable in upper respiratory specimens during the acute phase of infection. The lowest concentration of SARS-CoV-2 viral copies this assay can detect is 138 copies/mL. A negative result does not preclude SARS-Cov-2 infection and should not be used as the sole basis for treatment or other patient management decisions. A negative result may occur with  improper specimen collection/handling, submission of specimen other than nasopharyngeal swab, presence of viral mutation(s) within the areas targeted by this assay, and inadequate number of viral copies(<138 copies/mL). A negative result must be combined with clinical observations, patient history, and epidemiological information. The expected result is Negative.  Fact Sheet for Patients:  bloggercourse.com  Fact Sheet for Healthcare Providers:  seriousbroker.it  This test is no t yet approved or cleared by the [place]  FDA and  has been authorized for detection and/or diagnosis of SARS-CoV-2 by FDA under an Emergency Use Authorization (EUA). This EUA will remain  in effect (meaning this test can be used) for the duration of the COVID-19 declaration under Section 564(b)(1) of the Act, 21 U.S.C.section 360bbb-3(b)(1), unless the authorization is terminated  or revoked sooner.       Influenza A by PCR NEGATIVE NEGATIVE Final   Influenza B by PCR NEGATIVE NEGATIVE Final    Comment: (NOTE) The Xpert Xpress SARS-CoV-2/FLU/RSV plus assay is intended as an aid in the diagnosis of influenza from Nasopharyngeal swab specimens and should not be used as a sole basis for treatment. Nasal washings and aspirates are unacceptable for Xpert Xpress SARS-CoV-2/FLU/RSV testing.  Fact Sheet for Patients: bloggercourse.com  Fact Sheet for Healthcare Providers: seriousbroker.it  This test is not yet approved or cleared by the [place]  FDA  and has been authorized for detection and/or diagnosis of SARS-CoV-2 by FDA under an Emergency Use Authorization (EUA). This EUA will remain in effect (meaning this test can be used) for the duration of the COVID-19 declaration under Section 564(b)(1) of the Act, 21 U.S.C. section 360bbb-3(b)(1), unless the authorization is terminated or revoked.     Resp Syncytial Virus by PCR NEGATIVE NEGATIVE Final    Comment: (NOTE) Fact Sheet for Patients: bloggercourse.com  Fact Sheet for Healthcare Providers: seriousbroker.it  This test is not yet approved or cleared by the [place]  FDA and has been authorized for detection and/or diagnosis of SARS-CoV-2 by FDA under an Emergency Use Authorization (EUA). This EUA will remain in effect (meaning this test can be used) for the duration of the COVID-19 declaration under Section 564(b)(1) of the Act, 21 U.S.C. section 360bbb-3(b)(1), unless the authorization is terminated or revoked.  Performed at [facility], [address] [place],  [place] [address]   Culture, blood (Routine x 2)     Status: None (Preliminary result)   Collection Time: 10/16/23  4:21 PM   Specimen: BLOOD  Result Value Ref Range Status   Specimen Description BLOOD BLOOD LEFT ARM  Final   Special Requests   Final    BLOOD BOTTLES DRAWN AEROBIC AND ANAEROBIC Blood Culture results may not be optimal due to an inadequate volume of blood received in culture bottles   Culture   Final    NO GROWTH < 24 HOURS Performed at [facility], [address], [place], [place] [address]    Report Status PENDING  Incomplete  Surgical pcr screen     Status: None   Collection Time: 10/16/23  6:23 PM   Specimen: Nasal Mucosa; Nasal Swab  Result Value Ref Range Status   MRSA, PCR NEGATIVE NEGATIVE Final   Staphylococcus aureus NEGATIVE NEGATIVE Final    Comment: (NOTE) The Xpert SA Assay (FDA approved for NASAL  specimens in patients 63 years of age and older), is one component of a comprehensive surveillance program. It is not intended to diagnose infection nor to guide or monitor treatment. Performed at [facility], [address] [place], [place] [address]   Respiratory (~20 pathogens) panel by PCR     Status: None   Collection Time: 10/16/23 11:23 PM   Specimen: Nasopharyngeal Swab; Respiratory  Result Value Ref Range Status   Adenovirus NOT DETECTED NOT DETECTED Final   Coronavirus 229E NOT DETECTED NOT DETECTED Final    Comment: (NOTE) The Coronavirus on the Respiratory Panel, DOES NOT test for the novel  Coronavirus (2019 nCoV)    Coronavirus HKU1 NOT DETECTED NOT DETECTED Final   Coronavirus NL63 NOT DETECTED NOT DETECTED Final   Coronavirus OC43 NOT DETECTED NOT DETECTED Final   Metapneumovirus NOT DETECTED NOT DETECTED Final   Rhinovirus / Enterovirus NOT DETECTED NOT DETECTED Final   Influenza A NOT DETECTED NOT DETECTED Final   Influenza B NOT DETECTED NOT DETECTED Final   Parainfluenza Virus 1 NOT DETECTED NOT DETECTED Final   Parainfluenza Virus 2 NOT DETECTED NOT DETECTED Final   Parainfluenza Virus 3 NOT DETECTED NOT DETECTED Final   Parainfluenza Virus 4 NOT DETECTED NOT DETECTED Final   Respiratory Syncytial Virus NOT DETECTED NOT DETECTED Final   Bordetella pertussis NOT DETECTED NOT DETECTED Final   Bordetella Parapertussis NOT DETECTED NOT DETECTED Final   Chlamydophila pneumoniae NOT DETECTED NOT DETECTED Final   Mycoplasma pneumoniae NOT DETECTED NOT DETECTED Final    Comment: Performed at [facility], [address] [place], [place] [address]         Radiology Studies: ECHOCARDIOGRAM COMPLETE Result Date: 10/17/2023    ECHOCARDIOGRAM REPORT   Patient Name:   HILLARY [facility] Date of Exam: 10/17/2023 Medical Rec #:  [id number]   Height:       67.0 in Accession #:    [id number]  Weight:       189.2 lb Date of Birth:  1960-11-02   BSA:          1.975  m Patient Age:    62 years    BP:           145/69 mmHg Patient Gender: F           HR:           71 bpm. Exam Location:  Inpatient Procedure: 2D Echo, Cardiac Doppler and Color Doppler Indications:  Acutre respiratory distress R06.03  History:         Patient has no prior history of Echocardiogram examinations.                  Previous Myocardial Infarction, COPD; Risk                  Factors:Hypertension.  Sonographer:     Thea Norlander RCS Referring Phys:  [id number] [place] BROOM Diagnosing Phys: Lonni Hanson MD IMPRESSIONS  1. Left ventricular ejection fraction, by estimation, is 55 to 60%. The left ventricle has normal function. The left ventricle has no regional wall motion abnormalities. Left ventricular diastolic parameters are consistent with Grade I diastolic dysfunction (impaired relaxation).  2. Right ventricular systolic function is normal. The right ventricular size is normal. There is mildly elevated pulmonary artery systolic pressure.  3. The mitral valve is abnormal. Moderate mitral valve regurgitation. No evidence of mitral stenosis.  4. The aortic valve was not well visualized. Aortic valve regurgitation is moderate. No aortic stenosis is present.  5. The inferior vena cava is dilated in size with <50% respiratory variability, suggesting right atrial pressure of 15 mmHg. FINDINGS  Left Ventricle: Left ventricular ejection fraction, by estimation, is 55 to 60%. The left ventricle has normal function. The left ventricle has no regional wall motion abnormalities. The left ventricular internal cavity size was normal in size. There is  no left ventricular hypertrophy. Left ventricular diastolic parameters are consistent with Grade I diastolic dysfunction (impaired relaxation). Right Ventricle: The right ventricular size is normal. No increase in right ventricular wall thickness. Right ventricular systolic function is normal. There is mildly elevated pulmonary artery systolic pressure. The  tricuspid regurgitant velocity is 2.55  m/s, and with an assumed right atrial pressure of 15 mmHg, the estimated right ventricular systolic pressure is 41.0 mmHg. Left Atrium: Left atrial size was normal in size. Right Atrium: Right atrial size was normal in size. Pericardium: There is no evidence of pericardial effusion. Mitral Valve: The mitral valve is abnormal. Moderate mitral valve regurgitation. No evidence of mitral valve stenosis. Tricuspid Valve: The tricuspid valve is not well visualized. Tricuspid valve regurgitation is mild. Aortic Valve: The aortic valve was not well visualized. Aortic valve regurgitation is moderate. Aortic regurgitation PHT measures 266 msec. No aortic stenosis is present. Aortic valve mean gradient measures 8.0 mmHg. Aortic valve peak gradient measures 14.6 mmHg. Aortic valve area, by VTI measures 2.03 cm. Pulmonic Valve: The pulmonic valve was not well visualized. Pulmonic valve regurgitation is mild. No evidence of pulmonic stenosis. Aorta: The aortic root and ascending aorta are structurally normal, with no evidence of dilitation. Pulmonary Artery: The pulmonary artery is not well seen. Venous: The inferior vena cava is dilated in size with less than 50% respiratory variability, suggesting right atrial pressure of 15 mmHg. IAS/Shunts: No atrial level shunt detected by color flow Doppler.  LEFT VENTRICLE PLAX 2D LVIDd:         4.60 cm   Diastology LVIDs:         3.20 cm   LV e' medial:    7.51 cm/s LV PW:         1.00 cm   LV E/e' medial:  15.2 LV IVS:        0.90 cm   LV e' lateral:   7.94 cm/s LVOT diam:     2.00 cm   LV E/e' lateral: 14.4 LV SV:         77  LV SV Index:   39 LVOT Area:     3.14 cm  RIGHT VENTRICLE             IVC RV S prime:     14.60 cm/s  IVC diam: 2.60 cm TAPSE (M-mode): 2.4 cm LEFT ATRIUM             Index        RIGHT ATRIUM           Index LA diam:        4.00 cm 2.03 cm/m   RA Area:     15.40 cm LA Vol (A2C):   49.7 ml 25.17 ml/m  RA Volume:   36.30  ml  18.38 ml/m LA Vol (A4C):   53.4 ml 27.04 ml/m LA Biplane Vol: 54.1 ml 27.39 ml/m  AORTIC VALVE AV Area (Vmax):    1.90 cm AV Area (Vmean):   1.95 cm AV Area (VTI):     2.03 cm AV Vmax:           191.00 cm/s AV Vmean:          131.000 cm/s AV VTI:            0.380 m AV Peak Grad:      14.6 mmHg AV Mean Grad:      8.0 mmHg LVOT Vmax:         115.33 cm/s LVOT Vmean:        81.467 cm/s LVOT VTI:          0.246 m LVOT/AV VTI ratio: 0.65 AI PHT:            266 msec  AORTA Ao Root diam: 3.00 cm Ao Asc diam:  3.40 cm MITRAL VALVE                TRICUSPID VALVE MV Area (PHT): 4.15 cm     TR Peak grad:   26.0 mmHg MV Decel Time: 183 msec     TR Vmax:        255.00 cm/s MR Peak grad: 91.0 mmHg MR Vmax:      477.00 cm/s   SHUNTS MV E velocity: 114.00 cm/s  Systemic VTI:  0.25 m MV A velocity: 147.00 cm/s  Systemic Diam: 2.00 cm MV E/A ratio:  0.78 Lonni End MD Electronically signed by Lonni Hanson MD Signature Date/Time: 10/17/2023/3:25:42 PM    Final    CT Angio Chest Pulmonary Embolism (PE) W or WO Contrast Result Date: 10/16/2023 CLINICAL DATA:  Short of breath. Pulmonary embolism (PE) suspected, low to intermediate prob, positive D-dimer EXAM: CT ANGIOGRAPHY CHEST WITH CONTRAST TECHNIQUE: Multidetector CT imaging of the chest was performed using the standard protocol during bolus administration of intravenous contrast. Multiplanar CT image reconstructions and MIPs were obtained to evaluate the vascular anatomy. RADIATION DOSE REDUCTION: This exam was performed according to the departmental dose-optimization program which includes automated exposure control, adjustment of the mA and/or kV according to patient size and/or use of iterative reconstruction technique. CONTRAST:  OMNIPAQUE  IOHEXOL  350 MG/ML SOLN COMPARISON:  CT angio chest 10/03/2020 FINDINGS: Cardiovascular: Satisfactory opacification of the pulmonary arteries to the segmental level. No evidence of pulmonary embolism. The main  pulmonary artery measures slightly enlarged in caliber measuring up to 3.1 cm. Normal heart size. No significant pericardial effusion. The thoracic aorta is normal in caliber. Mild atherosclerotic plaque of the thoracic aorta. At least single vessel coronary artery calcifications. Mediastinum/Nodes: No enlarged mediastinal, hilar, or axillary lymph  nodes. trachea and esophagus demonstrate no significant findings. Heterogeneous thyroid  gland - This has been evaluated on previous imaging. (ref: J Am Coll Radiol. 2015 Feb;12(2): 143-50). vascular clips along the right thyroid  gland. Lungs/Pleura: Moderate and severe emphysematous changes. Bilateral mild atelectasis and scarring. Question developing subpleural right upper lobe airspace opacity. No pulmonary nodule. No pulmonary mass. No pleural effusion. No pneumothorax. Upper Abdomen: No acute abnormality. Musculoskeletal: No chest wall abnormality. No suspicious lytic or blastic osseous lesions. No acute displaced fracture. Review of the MIP images confirms the above findings. IMPRESSION: 1. No pulmonary embolus. 2. Question developing subpleural right upper lobe airspace opacity versus atelectasis/worsening scarring. 3. Enlarged pulmonary artery suggestive of pulmonary hypertension. 4. Aortic Atherosclerosis (ICD10-I70.0) and Emphysema (ICD10-J43.9). Electronically Signed   By: Morgane  Naveau M.D.   On: 10/16/2023 23:32   DG Chest Port 1 View Result Date: 10/16/2023 CLINICAL DATA:  Shortness of breath. EXAM: PORTABLE CHEST 1 VIEW COMPARISON:  Chest radiograph dated 09/20/2023. FINDINGS: Right upper lobe reticulonodular densities most consistent with infiltrate. Clinical correlation is recommended. Left lung base atelectasis/scarring. No large pleural effusion. No pneumothorax. Stable cardiac silhouette. No acute osseous pathology. Surgical clips over the neck. IMPRESSION: Right upper lobe infiltrate. Electronically Signed   By: Vanetta Chou M.D.   On:  10/16/2023 11:54        Scheduled Meds:  amLODipine   10 mg Oral Daily   aspirin   81 mg Oral Daily   aspirin   300 mg Rectal Once   budesonide  (PULMICORT ) nebulizer solution  0.25 mg Nebulization BID   celecoxib   200 mg Oral BID   Chlorhexidine  Gluconate Cloth  6 each Topical Daily   chlorthalidone   12.5 mg Oral Daily   cholecalciferol   2,000 Units Oral Daily   enoxaparin  (LOVENOX ) injection  40 mg Subcutaneous Q24H   escitalopram   10 mg Oral Daily   famotidine   20 mg Oral BID   ipratropium-albuterol   3 mL Nebulization Q6H   methocarbamol   500 mg Oral TID   predniSONE   40 mg Oral Q breakfast   pregabalin   150 mg Oral BID   rosuvastatin   20 mg Oral Daily   Continuous Infusions:  azithromycin  Stopped (10/16/23 2228)   cefTRIAXone  (ROCEPHIN )  IV Stopped (10/17/23 0953)     LOS: 1 day      Calvin KATHEE Robson, MD [facility]   If 7PM-7AM, please contact night-coverage  10/17/2023, 6:04 PM

## 2023-10-17 NOTE — Progress Notes (Signed)
Echocardiogram 2D Echocardiogram has been performed.  Cindy Gutierrez 10/17/2023, 5:51 PM

## 2023-10-17 NOTE — Progress Notes (Signed)
Patient refusing to take oxy for pain. She states it makes her nauseous and would like a note placed in her chart stating so.

## 2023-10-17 NOTE — Plan of Care (Signed)
  Problem: Education: Goal: Knowledge of disease or condition will improve Outcome: Progressing   Problem: Activity: Goal: Ability to tolerate increased activity will improve Outcome: Progressing   Problem: Respiratory: Goal: Ability to maintain a clear airway will improve Outcome: Progressing   Problem: Education: Goal: Knowledge of General Education information will improve Description: Including pain rating scale, medication(s)/side effects and non-pharmacologic comfort measures Outcome: Progressing   Problem: Activity: Goal: Risk for activity intolerance will decrease Outcome: Progressing   Problem: Elimination: Goal: Will not experience complications related to bowel motility Outcome: Progressing   Problem: Safety: Goal: Ability to remain free from injury will improve Outcome: Progressing

## 2023-10-17 NOTE — Plan of Care (Signed)
  Problem: Education: Goal: Knowledge of disease or condition will improve Outcome: Progressing   Problem: Respiratory: Goal: Ability to maintain a clear airway will improve Outcome: Progressing Goal: Levels of oxygenation will improve Outcome: Progressing Goal: Ability to maintain adequate ventilation will improve Outcome: Progressing   Problem: Clinical Measurements: Goal: Respiratory complications will improve Outcome: Progressing Goal: Cardiovascular complication will be avoided Outcome: Progressing   Problem: Nutrition: Goal: Adequate nutrition will be maintained Outcome: Progressing   Problem: Coping: Goal: Level of anxiety will decrease Outcome: Progressing   Problem: Pain Management: Goal: General experience of comfort will improve Outcome: Progressing   Problem: Skin Integrity: Goal: Risk for impaired skin integrity will decrease Outcome: Progressing

## 2023-10-18 ENCOUNTER — Encounter: Payer: Self-pay | Admitting: Internal Medicine

## 2023-10-18 ENCOUNTER — Other Ambulatory Visit: Payer: Self-pay

## 2023-10-18 DIAGNOSIS — J9602 Acute respiratory failure with hypercapnia: Secondary | ICD-10-CM | POA: Diagnosis not present

## 2023-10-18 DIAGNOSIS — J9601 Acute respiratory failure with hypoxia: Secondary | ICD-10-CM | POA: Diagnosis not present

## 2023-10-18 MED ORDER — AZITHROMYCIN 250 MG PO TABS
500.0000 mg | ORAL_TABLET | Freq: Every day | ORAL | Status: DC
  Administered 2023-10-18: 500 mg via ORAL
  Filled 2023-10-18: qty 2

## 2023-10-18 MED ORDER — IPRATROPIUM-ALBUTEROL 0.5-2.5 (3) MG/3ML IN SOLN
3.0000 mL | Freq: Three times a day (TID) | RESPIRATORY_TRACT | Status: DC
  Administered 2023-10-18 – 2023-10-19 (×4): 3 mL via RESPIRATORY_TRACT
  Filled 2023-10-18 (×4): qty 3

## 2023-10-18 MED ORDER — FUROSEMIDE 10 MG/ML IJ SOLN
60.0000 mg | Freq: Once | INTRAMUSCULAR | Status: AC
  Administered 2023-10-18: 60 mg via INTRAVENOUS
  Filled 2023-10-18: qty 6

## 2023-10-18 NOTE — Progress Notes (Addendum)
Patient got out of bed to [facility]. Became SOB with activity. Placed on 2 L [place] for comfort. SpO2 stayed WDL. Back on RA. Dr. Ashok Pall aware.

## 2023-10-18 NOTE — Plan of Care (Signed)
  Problem: Education: Goal: Knowledge of the prescribed therapeutic regimen will improve Outcome: Progressing   Problem: Activity: Goal: Will verbalize the importance of balancing activity with adequate rest periods Outcome: Progressing   Problem: Respiratory: Goal: Ability to maintain a clear airway will improve Outcome: Progressing Goal: Levels of oxygenation will improve Outcome: Progressing Goal: Ability to maintain adequate ventilation will improve Outcome: Progressing   Problem: Education: Goal: Knowledge of General Education information will improve Description: Including pain rating scale, medication(s)/side effects and non-pharmacologic comfort measures Outcome: Progressing   Problem: Health Behavior/Discharge Planning: Goal: Ability to manage health-related needs will improve Outcome: Progressing   Problem: Clinical Measurements: Goal: Ability to maintain clinical measurements within normal limits will improve Outcome: Progressing Goal: Will remain free from infection Outcome: Progressing Goal: Diagnostic test results will improve Outcome: Progressing Goal: Respiratory complications will improve Outcome: Progressing Goal: Cardiovascular complication will be avoided Outcome: Progressing   Problem: Activity: Goal: Risk for activity intolerance will decrease Outcome: Progressing   Problem: Nutrition: Goal: Adequate nutrition will be maintained Outcome: Progressing   Problem: Coping: Goal: Level of anxiety will decrease Outcome: Progressing   Problem: Elimination: Goal: Will not experience complications related to bowel motility Outcome: Progressing Goal: Will not experience complications related to urinary retention Outcome: Progressing   Problem: Pain Management: Goal: General experience of comfort will improve Outcome: Progressing   Problem: Safety: Goal: Ability to remain free from injury will improve Outcome: Progressing   Problem: Skin  Integrity: Goal: Risk for impaired skin integrity will decrease Outcome: Progressing

## 2023-10-18 NOTE — Plan of Care (Signed)
  Problem: Education: Goal: Knowledge of disease or condition will improve Outcome: Progressing Goal: Knowledge of the prescribed therapeutic regimen will improve Outcome: Progressing Goal: Individualized Educational Video(s) Outcome: Progressing   Problem: Activity: Goal: Ability to tolerate increased activity will improve Outcome: Not Progressing Goal: Will verbalize the importance of balancing activity with adequate rest periods Outcome: Not Progressing   Problem: Respiratory: Goal: Ability to maintain a clear airway will improve Outcome: Progressing Goal: Levels of oxygenation will improve Outcome: Progressing Goal: Ability to maintain adequate ventilation will improve Outcome: Progressing   Problem: Education: Goal: Knowledge of General Education information will improve Description: Including pain rating scale, medication(s)/side effects and non-pharmacologic comfort measures Outcome: Progressing   Problem: Health Behavior/Discharge Planning: Goal: Ability to manage health-related needs will improve Outcome: Progressing   Problem: Clinical Measurements: Goal: Ability to maintain clinical measurements within normal limits will improve Outcome: Progressing Goal: Will remain free from infection Outcome: Progressing Goal: Diagnostic test results will improve Outcome: Progressing Goal: Respiratory complications will improve Outcome: Progressing Goal: Cardiovascular complication will be avoided Outcome: Progressing   Problem: Activity: Goal: Risk for activity intolerance will decrease Outcome: Not Progressing   Problem: Nutrition: Goal: Adequate nutrition will be maintained Outcome: Progressing   Problem: Coping: Goal: Level of anxiety will decrease Outcome: Progressing   Problem: Elimination: Goal: Will not experience complications related to bowel motility Outcome: Progressing Goal: Will not experience complications related to urinary retention Outcome:  Progressing   Problem: Pain Management: Goal: General experience of comfort will improve Outcome: Not Progressing   Problem: Safety: Goal: Ability to remain free from injury will improve Outcome: Progressing   Problem: Skin Integrity: Goal: Risk for impaired skin integrity will decrease Outcome: Progressing

## 2023-10-18 NOTE — Progress Notes (Signed)
PROGRESS NOTE    Cindy Gutierrez  XBM:[id number] DOB: 31-Aug-1961 DOA: 10/16/2023 PCP: Patient, No Pcp Per    Brief Narrative:  63 y.o. female with medical history significant of Hypertension, COPD, prediabetes, carotid artery disease, who presents to the ED due to unresponsiveness.   Cindy Gutierrez states that she remembers earlier this morning she went to use the restroom and had noted sudden onset shortness of breath.  She is set up with nebulizer treatment and this did not help her breathing at all.  She asked her son to call 911.  She does not remember anything after this.   Her son at bedside states that they went into the room to check on her and noted that she was passed out not responding to them.   Cindy Gutierrez denies any recent shortness of breath prior to today, chest pain, palpitations.  She notes that she has chronic orthopnea that has been ongoing for years as well as bilateral lower extremity swelling.  She denies any recent fever, chills, cough.   Assessment & Plan:   Principal Problem:   Acute respiratory failure (HCC) Active Problems:   Hypertensive emergency   SIRS (systemic inflammatory response syndrome) (HCC)   NSTEMI (non-ST elevated myocardial infarction) (HCC)   COPD with acute exacerbation (HCC)   Acute respiratory failure (HCC) Patient is presenting with sudden onset shortness of breath with no reported respiratory symptoms over the last few days.  Due to this, primary suspicion at this point is flash pulmonary edema in the setting of severe uncontrolled hypertension.  BNP is markedly elevated 810.  Chest x-ray read as possible right upper lobe infiltrate, however unclear on personal review.  Differential includes COPD exacerbation vs PE vs acs. CTA neg for PE.  Patient was on NIPPV and respiratory status is markedly improved.  On nasal cannula at 2 L/min as of 1/14. TTE showing preserved EF, elevated filling pressures Plan: Patient received one-time dose of Lasix  IV Will give additional lasix today  Hypertensive emergency On admission, patient was markedly hypertensive at 199/131 with concern for pulmonary edema.  Per chart review, patient was previously on chlorthalidone and amlodipine, however this does not appear to be on her medication list now Plan: Nitroglycerin infusion weaned off Home amlodipine and chlorthalidone restarted BPs much improved    SIRS (systemic inflammatory response syndrome) (HCC) On presentation, patient was noted to be tachycardic, tachypneic, with leukocytosis concerning for SIRS/sepsis.  Although chest x-ray is being read as a possible infiltrate in the right upper lobe, no other signs of infection at this time.  Patient is afebrile with no reported cough, rhinorrhea, congestion, nausea, vomiting, diarrhea.  Suspect noninfectious etiology at this time.  Respiratory viral panel negative Plan: D/c abx and monitor  NSTEMI (non-ST elevated myocardial infarction) (HCC),  Troponins plateaued in 500s, normal ef w/o regional wall motion abnormality on tte, does continue to complain of mild substernal pain. Thinks she may have received a cardiac cath some time many years ago - will review with cardiology  COPD with acute exacerbation (HCC) History of significant COPD with multiple visits for exacerbation with most recent admission from 09/18/2023 to 09/21/2023.  Notable wheezing on examination initially, now improving Plan: - S/p Solu-Medrol 125 mg once - cont prednisone 40 mg tomorrow to complete a 5-day course - DuoNebs every 6 hours - Pulmicort twice daily  Debility - f/u PT consult   DVT prophylaxis: SQ Lovenox (patient refused, also refused SCDs) Code Status: Full Family Communication: None Disposition  Plan: Status is: Inpatient Remains inpatient appropriate because: Multiple acute issues as above   Level of care: Progressive  Consultants:  None  Procedures:  None    Subjective: Seen and examined.   Sitting up in bed.  Reports shortness of breath is much improved. Still with some mild substernal chest pain  Objective: Vitals:   10/18/23 0900 10/18/23 1000 10/18/23 1100 10/18/23 1131  BP: (!) 159/56 (!) 163/63 (!) 135/59   Pulse: (!) 56 62 (!) 57   Resp: 13 17 17    Temp:    98.2 F (36.8 C)  TempSrc:    Oral  SpO2: 96% 99% 97%   Weight:      Height:        Intake/Output Summary (Last 24 hours) at 10/18/2023 1249 Last data filed at 10/18/2023 1100 Gross per 24 hour  Intake 860 ml  Output 1700 ml  Net -840 ml   Filed Weights   10/16/23 1058 10/16/23 1550 10/17/23 0353  Weight: 81.6 kg 86 kg 85.8 kg    Examination:  General exam: Appears calm and comfortable  Respiratory system: normal wob, rales at bases, no wheeze Cardiovascular system: S1 & S2 heard, RRR.   Gastrointestinal system: Soft, NT/ND, normal bowel sounds Central nervous system: Alert and oriented. No focal neurological deficits. Extremities: Symmetric 5 x 5 power. Skin: No rashes, lesions or ulcers Psychiatry: Judgement and insight appear normal. Mood & affect appropriate.     Data Reviewed: I have personally reviewed following labs and imaging studies  CBC: Recent Labs  Lab 10/16/23 1053 10/17/23 0714  WBC 15.2* 10.3  NEUTROABS 9.7* 9.3*  HGB 12.2 11.0*  HCT 40.8 33.9*  MCV 91.3 84.8  PLT 445* 318   Basic Metabolic Panel: Recent Labs  Lab 10/16/23 1053 10/17/23 0714  NA 142 143  K 5.0 4.2  CL 108 106  CO2 23 26  GLUCOSE 247* 128*  BUN 14 19  CREATININE 0.87 0.86  CALCIUM 8.7* 8.7*   GFR: Estimated Creatinine Clearance: 75.4 mL/min (by C-G formula based on SCr of 0.86 mg/dL). Liver Function Tests: Recent Labs  Lab 10/16/23 1053  AST 33  ALT 16  ALKPHOS 114  BILITOT 0.5  PROT 6.9  ALBUMIN 3.5   No results for input(s): "LIPASE", "AMYLASE" in the last 168 hours. No results for input(s): "AMMONIA" in the last 168 hours. Coagulation Profile: Recent Labs  Lab 10/16/23 1053   INR 1.0   Cardiac Enzymes: No results for input(s): "CKTOTAL", "CKMB", "CKMBINDEX", "TROPONINI" in the last 168 hours. BNP (last 3 results) No results for input(s): "PROBNP" in the last 8760 hours. HbA1C: No results for input(s): "HGBA1C" in the last 72 hours. CBG: Recent Labs  Lab 10/16/23 1046 10/16/23 1545  GLUCAP 217* 112*   Lipid Profile: No results for input(s): "CHOL", "HDL", "LDLCALC", "TRIG", "CHOLHDL", "LDLDIRECT" in the last 72 hours. Thyroid Function Tests: Recent Labs    10/16/23 1621  TSH 0.464   Anemia Panel: No results for input(s): "VITAMINB12", "FOLATE", "FERRITIN", "TIBC", "IRON", "RETICCTPCT" in the last 72 hours. Sepsis Labs: Recent Labs  Lab 10/16/23 1053 10/16/23 1249 10/16/23 1621  PROCALCITON  --  <0.10 2.83  LATICACIDVEN 3.5* 1.3  --     Recent Results (from the past 240 hours)  Culture, blood (Routine x 2)     Status: None (Preliminary result)   Collection Time: 10/16/23 11:20 AM   Specimen: BLOOD  Result Value Ref Range Status   Specimen Description BLOOD L HAND  Final   Special Requests   Final    BOTTLES DRAWN AEROBIC AND ANAEROBIC Blood Culture results may not be optimal due to an inadequate volume of blood received in culture bottles   Culture   Final    NO GROWTH 2 DAYS Performed at [facility], [address] [place], [place] [address]    Report Status PENDING  Incomplete  Resp panel by RT-PCR (RSV, Flu A&B, Covid) Anterior Nasal Swab     Status: None   Collection Time: 10/16/23 12:49 PM   Specimen: Anterior Nasal Swab  Result Value Ref Range Status   SARS Coronavirus 2 by RT PCR NEGATIVE NEGATIVE Final    Comment: (NOTE) SARS-CoV-2 target nucleic acids are NOT DETECTED.  The SARS-CoV-2 RNA is generally detectable in upper respiratory specimens during the acute phase of infection. The lowest concentration of SARS-CoV-2 viral copies this assay can detect is 138 copies/mL. A negative result does not preclude  SARS-Cov-2 infection and should not be used as the sole basis for treatment or other patient management decisions. A negative result may occur with  improper specimen collection/handling, submission of specimen other than nasopharyngeal swab, presence of viral mutation(s) within the areas targeted by this assay, and inadequate number of viral copies(<138 copies/mL). A negative result must be combined with clinical observations, patient history, and epidemiological information. The expected result is Negative.  Fact Sheet for Patients:  BloggerCourse.com  Fact Sheet for Healthcare Providers:  SeriousBroker.it  This test is no t yet approved or cleared by the [place] FDA and  has been authorized for detection and/or diagnosis of SARS-CoV-2 by FDA under an Emergency Use Authorization (EUA). This EUA will remain  in effect (meaning this test can be used) for the duration of the COVID-19 declaration under Section 564(b)(1) of the Act, 21 U.S.C.section 360bbb-3(b)(1), unless the authorization is terminated  or revoked sooner.       Influenza A by PCR NEGATIVE NEGATIVE Final   Influenza B by PCR NEGATIVE NEGATIVE Final    Comment: (NOTE) The Xpert Xpress SARS-CoV-2/FLU/RSV plus assay is intended as an aid in the diagnosis of influenza from Nasopharyngeal swab specimens and should not be used as a sole basis for treatment. Nasal washings and aspirates are unacceptable for Xpert Xpress SARS-CoV-2/FLU/RSV testing.  Fact Sheet for Patients: BloggerCourse.com  Fact Sheet for Healthcare Providers: SeriousBroker.it  This test is not yet approved or cleared by the [place] FDA and has been authorized for detection and/or diagnosis of SARS-CoV-2 by FDA under an Emergency Use Authorization (EUA). This EUA will remain in effect (meaning this test can be used) for the duration of  the COVID-19 declaration under Section 564(b)(1) of the Act, 21 U.S.C. section 360bbb-3(b)(1), unless the authorization is terminated or revoked.     Resp Syncytial Virus by PCR NEGATIVE NEGATIVE Final    Comment: (NOTE) Fact Sheet for Patients: BloggerCourse.com  Fact Sheet for Healthcare Providers: SeriousBroker.it  This test is not yet approved or cleared by the [place] FDA and has been authorized for detection and/or diagnosis of SARS-CoV-2 by FDA under an Emergency Use Authorization (EUA). This EUA will remain in effect (meaning this test can be used) for the duration of the COVID-19 declaration under Section 564(b)(1) of the Act, 21 U.S.C. section 360bbb-3(b)(1), unless the authorization is terminated or revoked.  Performed at [facility], [address] [place], [place] [address]   Culture, blood (Routine x 2)     Status: None (Preliminary result)  Collection Time: 10/16/23  4:21 PM   Specimen: BLOOD  Result Value Ref Range Status   Specimen Description BLOOD BLOOD LEFT ARM  Final   Special Requests   Final    BLOOD BOTTLES DRAWN AEROBIC AND ANAEROBIC Blood Culture results may not be optimal due to an inadequate volume of blood received in culture bottles   Culture   Final    NO GROWTH 2 DAYS Performed at [facility], [address] [place], [place] [address]    Report Status PENDING  Incomplete  Surgical pcr screen     Status: None   Collection Time: 10/16/23  6:23 PM   Specimen: Nasal Mucosa; Nasal Swab  Result Value Ref Range Status   MRSA, PCR NEGATIVE NEGATIVE Final   Staphylococcus aureus NEGATIVE NEGATIVE Final    Comment: (NOTE) The Xpert SA Assay (FDA approved for NASAL specimens in patients 66 years of age and older), is one component of a comprehensive surveillance program. It is not intended to diagnose infection nor to guide or monitor treatment. Performed at [facility], [address] [place], [place] [address]   Respiratory (~20 pathogens) panel by PCR     Status: None   Collection Time: 10/16/23 11:23 PM   Specimen: Nasopharyngeal Swab; Respiratory  Result Value Ref Range Status   Adenovirus NOT DETECTED NOT DETECTED Final   Coronavirus 229E NOT DETECTED NOT DETECTED Final    Comment: (NOTE) The Coronavirus on the Respiratory Panel, DOES NOT test for the novel  Coronavirus (2019 nCoV)    Coronavirus HKU1 NOT DETECTED NOT DETECTED Final   Coronavirus NL63 NOT DETECTED NOT DETECTED Final   Coronavirus OC43 NOT DETECTED NOT DETECTED Final   Metapneumovirus NOT DETECTED NOT DETECTED Final   Rhinovirus / Enterovirus NOT DETECTED NOT DETECTED Final   Influenza A NOT DETECTED NOT DETECTED Final   Influenza B NOT DETECTED NOT DETECTED Final   Parainfluenza Virus 1 NOT DETECTED NOT DETECTED Final   Parainfluenza Virus 2 NOT DETECTED NOT DETECTED Final   Parainfluenza Virus 3 NOT DETECTED NOT DETECTED Final   Parainfluenza Virus 4 NOT DETECTED NOT DETECTED Final   Respiratory Syncytial Virus NOT DETECTED NOT DETECTED Final   Bordetella pertussis NOT DETECTED NOT DETECTED Final   Bordetella Parapertussis NOT DETECTED NOT DETECTED Final   Chlamydophila pneumoniae NOT DETECTED NOT DETECTED Final   Mycoplasma pneumoniae NOT DETECTED NOT DETECTED Final    Comment: Performed at [facility], [address], [place], [place] [address]         Radiology Studies: ECHOCARDIOGRAM COMPLETE Result Date: 10/17/2023    ECHOCARDIOGRAM REPORT   Patient Name:   [facility] Date of Exam: 10/17/2023 Medical Rec #:  [id number]   Height:       67.0 in Accession #:    [id number]  Weight:       189.2 lb Date of Birth:  [birth year]   BSA:          1.975 m Patient Age:    62 years    BP:           145/69 mmHg Patient Gender: F           HR:           71 bpm. Exam Location:  Inpatient Procedure: 2D Echo, Cardiac Doppler and Color Doppler Indications:      Acutre respiratory distress R06.03  History:         Patient has no prior  history of Echocardiogram examinations.                  Previous Myocardial Infarction, COPD; Risk                  Factors:Hypertension.  Sonographer:     Lucendia Herrlich RCS Referring Phys:  [id number] Verdene Lennert Diagnosing Phys: Yvonne Kendall MD IMPRESSIONS  1. Left ventricular ejection fraction, by estimation, is 55 to 60%. The left ventricle has normal function. The left ventricle has no regional wall motion abnormalities. Left ventricular diastolic parameters are consistent with Grade I diastolic dysfunction (impaired relaxation).  2. Right ventricular systolic function is normal. The right ventricular size is normal. There is mildly elevated pulmonary artery systolic pressure.  3. The mitral valve is abnormal. Moderate mitral valve regurgitation. No evidence of mitral stenosis.  4. The aortic valve was not well visualized. Aortic valve regurgitation is moderate. No aortic stenosis is present.  5. The inferior vena cava is dilated in size with <50% respiratory variability, suggesting right atrial pressure of 15 mmHg. FINDINGS  Left Ventricle: Left ventricular ejection fraction, by estimation, is 55 to 60%. The left ventricle has normal function. The left ventricle has no regional wall motion abnormalities. The left ventricular internal cavity size was normal in size. There is  no left ventricular hypertrophy. Left ventricular diastolic parameters are consistent with Grade I diastolic dysfunction (impaired relaxation). Right Ventricle: The right ventricular size is normal. No increase in right ventricular wall thickness. Right ventricular systolic function is normal. There is mildly elevated pulmonary artery systolic pressure. The tricuspid regurgitant velocity is 2.55  m/s, and with an assumed right atrial pressure of 15 mmHg, the estimated right ventricular systolic pressure is 41.0 mmHg. Left Atrium: Left atrial size was normal in  size. Right Atrium: Right atrial size was normal in size. Pericardium: There is no evidence of pericardial effusion. Mitral Valve: The mitral valve is abnormal. Moderate mitral valve regurgitation. No evidence of mitral valve stenosis. Tricuspid Valve: The tricuspid valve is not well visualized. Tricuspid valve regurgitation is mild. Aortic Valve: The aortic valve was not well visualized. Aortic valve regurgitation is moderate. Aortic regurgitation PHT measures 266 msec. No aortic stenosis is present. Aortic valve mean gradient measures 8.0 mmHg. Aortic valve peak gradient measures 14.6 mmHg. Aortic valve area, by VTI measures 2.03 cm. Pulmonic Valve: The pulmonic valve was not well visualized. Pulmonic valve regurgitation is mild. No evidence of pulmonic stenosis. Aorta: The aortic root and ascending aorta are structurally normal, with no evidence of dilitation. Pulmonary Artery: The pulmonary artery is not well seen. Venous: The inferior vena cava is dilated in size with less than 50% respiratory variability, suggesting right atrial pressure of 15 mmHg. IAS/Shunts: No atrial level shunt detected by color flow Doppler.  LEFT VENTRICLE PLAX 2D LVIDd:         4.60 cm   Diastology LVIDs:         3.20 cm   LV e' medial:    7.51 cm/s LV PW:         1.00 cm   LV E/e' medial:  15.2 LV IVS:        0.90 cm   LV e' lateral:   7.94 cm/s LVOT diam:     2.00 cm   LV E/e' lateral: 14.4 LV SV:         77 LV SV Index:   39 LVOT Area:     3.14 cm  RIGHT VENTRICLE  IVC RV S prime:     14.60 cm/s  IVC diam: 2.60 cm TAPSE (M-mode): 2.4 cm LEFT ATRIUM             Index        RIGHT ATRIUM           Index LA diam:        4.00 cm 2.03 cm/m   RA Area:     15.40 cm LA Vol (A2C):   49.7 ml 25.17 ml/m  RA Volume:   36.30 ml  18.38 ml/m LA Vol (A4C):   53.4 ml 27.04 ml/m LA Biplane Vol: 54.1 ml 27.39 ml/m  AORTIC VALVE AV Area (Vmax):    1.90 cm AV Area (Vmean):   1.95 cm AV Area (VTI):     2.03 cm AV Vmax:            191.00 cm/s AV Vmean:          131.000 cm/s AV VTI:            0.380 m AV Peak Grad:      14.6 mmHg AV Mean Grad:      8.0 mmHg LVOT Vmax:         115.33 cm/s LVOT Vmean:        81.467 cm/s LVOT VTI:          0.246 m LVOT/AV VTI ratio: 0.65 AI PHT:            266 msec  AORTA Ao Root diam: 3.00 cm Ao Asc diam:  3.40 cm MITRAL VALVE                TRICUSPID VALVE MV Area (PHT): 4.15 cm     TR Peak grad:   26.0 mmHg MV Decel Time: 183 msec     TR Vmax:        255.00 cm/s MR Peak grad: 91.0 mmHg MR Vmax:      477.00 cm/s   SHUNTS MV E velocity: 114.00 cm/s  Systemic VTI:  0.25 m MV A velocity: 147.00 cm/s  Systemic Diam: 2.00 cm MV E/A ratio:  0.78 Cristal [place] MD Electronically signed by Yvonne Kendall MD Signature Date/Time: 10/17/2023/3:25:42 PM    Final    CT Angio Chest Pulmonary Embolism (PE) W or WO Contrast Result Date: 10/16/2023 CLINICAL DATA:  Short of breath. Pulmonary embolism (PE) suspected, low to intermediate prob, positive D-dimer EXAM: CT ANGIOGRAPHY CHEST WITH CONTRAST TECHNIQUE: Multidetector CT imaging of the chest was performed using the standard protocol during bolus administration of intravenous contrast. Multiplanar CT image reconstructions and MIPs were obtained to evaluate the vascular anatomy. RADIATION DOSE REDUCTION: This exam was performed according to the departmental dose-optimization program which includes automated exposure control, adjustment of the mA and/or kV according to patient size and/or use of iterative reconstruction technique. CONTRAST:  OMNIPAQUE IOHEXOL 350 MG/ML SOLN COMPARISON:  CT angio chest 10/03/2020 FINDINGS: Cardiovascular: Satisfactory opacification of the pulmonary arteries to the segmental level. No evidence of pulmonary embolism. The main pulmonary artery measures slightly enlarged in caliber measuring up to 3.1 cm. Normal heart size. No significant pericardial effusion. The thoracic aorta is normal in caliber. Mild atherosclerotic plaque of the  thoracic aorta. At least single vessel coronary artery calcifications. Mediastinum/Nodes: No enlarged mediastinal, hilar, or axillary lymph nodes. trachea and esophagus demonstrate no significant findings. Heterogeneous thyroid gland - This has been evaluated on previous imaging. (ref: J Am Coll Radiol. 2015 Feb;12(2): 143-50). vascular clips  along the right thyroid gland. Lungs/Pleura: Moderate and severe emphysematous changes. Bilateral mild atelectasis and scarring. Question developing subpleural right upper lobe airspace opacity. No pulmonary nodule. No pulmonary mass. No pleural effusion. No pneumothorax. Upper Abdomen: No acute abnormality. Musculoskeletal: No chest wall abnormality. No suspicious lytic or blastic osseous lesions. No acute displaced fracture. Review of the MIP images confirms the above findings. IMPRESSION: 1. No pulmonary embolus. 2. Question developing subpleural right upper lobe airspace opacity versus atelectasis/worsening scarring. 3. Enlarged pulmonary artery suggestive of pulmonary hypertension. 4. Aortic Atherosclerosis (ICD10-I70.0) and Emphysema (ICD10-J43.9). Electronically Signed   By: Tish Frederickson M.D.   On: 10/16/2023 23:32        Scheduled Meds:  amLODipine  10 mg Oral Daily   aspirin  81 mg Oral Daily   aspirin  300 mg Rectal Once   azithromycin  500 mg Oral Daily   budesonide (PULMICORT) nebulizer solution  0.25 mg Nebulization BID   celecoxib  200 mg Oral BID   Chlorhexidine Gluconate Cloth  6 each Topical Daily   chlorthalidone  12.5 mg Oral Daily   cholecalciferol  2,000 Units Oral Daily   enoxaparin (LOVENOX) injection  40 mg Subcutaneous Q24H   escitalopram  10 mg Oral Daily   famotidine  20 mg Oral BID   furosemide  60 mg Intravenous Once   ipratropium-albuterol  3 mL Nebulization TID   methocarbamol  500 mg Oral TID   predniSONE  40 mg Oral Q breakfast   pregabalin  150 mg Oral BID   rosuvastatin  20 mg Oral Daily   Continuous Infusions:   cefTRIAXone (ROCEPHIN)  IV 1 g (10/18/23 0855)     LOS: 2 days      Silvano Bilis, MD [facility]   If 7PM-7AM, please contact night-coverage  10/18/2023, 12:49 PM

## 2023-10-19 ENCOUNTER — Encounter: Payer: Self-pay | Admitting: Emergency Medicine

## 2023-10-19 DIAGNOSIS — J9601 Acute respiratory failure with hypoxia: Secondary | ICD-10-CM | POA: Diagnosis not present

## 2023-10-19 DIAGNOSIS — J9602 Acute respiratory failure with hypercapnia: Secondary | ICD-10-CM | POA: Diagnosis not present

## 2023-10-19 LAB — BASIC METABOLIC PANEL
Anion gap: 11 (ref 5–15)
BUN: 30 mg/dL — ABNORMAL HIGH (ref 8–23)
CO2: 30 mmol/L (ref 22–32)
Calcium: 8.6 mg/dL — ABNORMAL LOW (ref 8.9–10.3)
Chloride: 101 mmol/L (ref 98–111)
Creatinine, Ser: 0.96 mg/dL (ref 0.44–1.00)
GFR, Estimated: >60 mL/min (ref >60)
Glucose, Bld: 85 mg/dL (ref 70–99)
Potassium: 3.2 mmol/L — ABNORMAL LOW (ref 3.5–5.1)
Sodium: 142 mmol/L (ref 135–145)

## 2023-10-19 MED ORDER — CHLORTHALIDONE 25 MG PO TABS: 12.5000 mg | ORAL_TABLET | Freq: Every day | ORAL | 1 refills | Status: DC

## 2023-10-19 MED ORDER — AMLODIPINE BESYLATE 10 MG PO TABS: 10.0000 mg | ORAL_TABLET | Freq: Every day | ORAL | 1 refills | Status: DC

## 2023-10-19 MED ORDER — POTASSIUM CHLORIDE CRYS ER 20 MEQ PO TBCR
60.0000 meq | EXTENDED_RELEASE_TABLET | Freq: Once | ORAL | Status: AC
  Administered 2023-10-19: 60 meq via ORAL
  Filled 2023-10-19: qty 3

## 2023-10-19 MED ORDER — PREDNISONE 20 MG PO TABS: 40.0000 mg | ORAL_TABLET | Freq: Every day | ORAL | 0 refills | Status: AC

## 2023-10-19 MED ORDER — PREDNISONE 20 MG PO TABS: 40.0000 mg | ORAL_TABLET | Freq: Every day | ORAL | 0 refills | Status: DC

## 2023-10-19 MED ORDER — ALBUTEROL SULFATE HFA 108 (90 BASE) MCG/ACT IN AERS: 2.0000 | INHALATION_SPRAY | Freq: Four times a day (QID) | RESPIRATORY_TRACT | 2 refills | Status: AC | PRN

## 2023-10-19 NOTE — Progress Notes (Signed)
The patient is transferred to 2 A 239 in stable condition. She was accompanied by an Charity fundraiser and a NT. Ryli Depetro (Patient's son) is notified.

## 2023-10-19 NOTE — Care Management Important Message (Deleted)
Important Message  Patient Details  Name: Cindy Gutierrez MRN: [id number] Date of Birth: 04-Aug-1961   Important Message Given:  Yes - Medicare IM     Sherilyn Banker 10/19/2023, 12:22 PM

## 2023-10-19 NOTE — TOC CM/SW Note (Signed)
Transition of Care [facility]) - Inpatient Brief Assessment   Patient Details  Name: Cindy Gutierrez MRN: [id number] Date of Birth: Jan 04, 1961  Transition of Care [facility]) CM/SW Contact:    Margarito Liner, LCSW Phone Number: 10/19/2023, 12:31 PM   Clinical Narrative: Patient has orders to discharge home today. Chart reviewed. No TOC needs identified. CSW signing off.  Transition of Care Asessment: Insurance and Status: Insurance coverage has been reviewed Patient has primary care physician: Yes Home environment has been reviewed: Single family home Prior level of function:: Independent Prior/Current Home Services: No current home services Social Drivers of Health Review: SDOH reviewed no interventions necessary Readmission risk has been reviewed: Yes Transition of care needs: no transition of care needs at this time

## 2023-10-19 NOTE — Evaluation (Signed)
Physical Therapy Evaluation Patient Details Name: Cindy Gutierrez MRN: [id number] DOB: 1961-06-30 Today's Date: 10/19/2023  History of Present Illness  Pt is a 63 y/o RF admitted on 10/16/23 after presenting following an episode of unresponsiveness. Pt is being treated for ARF suspected 2/2 flash pulmonary edema in the setting of severe uncontrolled HTN. PMH: HTN, COPD, prediabetes, CAD  Clinical Impression  Pt seen for PT evaluation with pt agreeable to tx. Pt reports prior to adimssion she was independent without AD, denies falls, living in 2nd floor apartment with 2 sons. On this date, pt is able to complete bed mobility with mod I, STS independently, and ambulate in hallway without AD & negotiate 7 + 7 steps with B rails with supervision. Pt does demonstrate SOB during session, PT educated pt on pursed lip breathing. SpO2 in bed at beginning of session 95% on room air, 90% sitting at end of session, increased to 94%. At this time, pt does not require acute PT services but would benefit from ongoing mobilization/gait with nursing staff/mobility specialists to increase endurance/activity tolerance. PT to complete current orders, please re-consult if new needs arise.     If plan is discharge home, recommend the following: Assistance with cooking/housework;Assist for transportation;Help with stairs or ramp for entrance   Can travel by private vehicle        Equipment Recommendations None recommended by PT  Recommendations for Other Services       Functional Status Assessment Patient has had a recent decline in their functional status and demonstrates the ability to make significant improvements in function in a reasonable and predictable amount of time.     Precautions / Restrictions Precautions Precautions: None Restrictions Weight Bearing Restrictions Per Provider Order: No      Mobility  Bed Mobility Overal bed mobility: Modified Independent             General bed mobility  comments: supine>sit    Transfers Overall transfer level: Independent Equipment used: None               General transfer comment: STS from EOB    Ambulation/Gait Ambulation/Gait assistance: Supervision Gait Distance (Feet): 150 Feet Assistive device: None Gait Pattern/deviations: Decreased step length - right, Decreased step length - left, Decreased stride length Gait velocity: slightly decreased     General Gait Details: initially slightly decreased weight shift to RLE  Stairs Stairs: Yes Stairs assistance: Supervision Stair Management: Two rails, Step to pattern Number of Stairs:  (7 + 7)    Wheelchair Mobility     Tilt Bed    Modified Rankin (Stroke Patients Only)       Balance Overall balance assessment: Mild deficits observed, not formally tested, Needs assistance Sitting-balance support: Feet supported Sitting balance-Leahy Scale: Normal     Standing balance support: During functional activity, No upper extremity supported Standing balance-Leahy Scale: Good                               Pertinent Vitals/Pain Pain Assessment Pain Assessment: Faces Faces Pain Scale: Hurts little more Pain Location: chest soreness 2/2 compressions (per pt report) Pain Descriptors / Indicators: Discomfort, Sore Pain Intervention(s): Monitored during session, Limited activity within patient's tolerance    Home Living Family/patient expects to be discharged to:: Private residence Living Arrangements: Children Available Help at Discharge: Family;Available 24 hours/day Type of Home: Apartment Home Access: Stairs to enter Entrance Stairs-Rails: Left;Right Entrance Stairs-Number of Steps: flight  Home Layout: One level        Prior Function Prior Level of Function : Independent/Modified Independent             Mobility Comments: denies falls       Extremity/Trunk Assessment   Upper Extremity Assessment Upper Extremity Assessment:  Overall WFL for tasks assessed    Lower Extremity Assessment Lower Extremity Assessment: Overall WFL for tasks assessed;Generalized weakness       Communication   Communication Communication: No apparent difficulties  Cognition Arousal: Alert Behavior During Therapy: WFL for tasks assessed/performed Overall Cognitive Status: Within Functional Limits for tasks assessed                                          General Comments General comments (skin integrity, edema, etc.): max HR 84 bpm    Exercises     Assessment/Plan    PT Assessment Patient does not need any further PT services  PT Problem List         PT Treatment Interventions      PT Goals (Current goals can be found in the Care Plan section)  Acute Rehab PT Goals Patient Stated Goal: recover, go home PT Goal Formulation: With patient Time For Goal Achievement: 11/02/23 Potential to Achieve Goals: Good    Frequency       Co-evaluation               AM-PAC PT "6 Clicks" Mobility  Outcome Measure Help needed turning from your back to your side while in a flat bed without using bedrails?: None Help needed moving from lying on your back to sitting on the side of a flat bed without using bedrails?: None Help needed moving to and from a bed to a chair (including a wheelchair)?: None Help needed standing up from a chair using your arms (e.g., wheelchair or bedside chair)?: None Help needed to walk in hospital room?: A Little Help needed climbing 3-5 steps with a railing? : A Little 6 Click Score: 22    End of Session   Activity Tolerance: Patient tolerated treatment well;Patient limited by fatigue Patient left: in chair;with call bell/phone within reach        Time: 0925-0935 PT Time Calculation (min) (ACUTE ONLY): 10 min   Charges:   PT Evaluation $PT Eval Moderate Complexity: 1 Mod   PT General Charges $$ ACUTE PT VISIT: 1 Visit         Aleda Grana, PT,  DPT 10/19/23, 9:43 AM   Sandi Mariscal 10/19/2023, 9:41 AM

## 2023-10-19 NOTE — Plan of Care (Signed)
  Problem: Clinical Measurements: Goal: Ability to maintain clinical measurements within normal limits will improve Outcome: Progressing   Problem: Pain Management: Goal: General experience of comfort will improve Outcome: Progressing   Problem: Safety: Goal: Ability to remain free from injury will improve Outcome: Progressing   Problem: Elimination: Goal: Will not experience complications related to bowel motility Outcome: Progressing

## 2023-10-19 NOTE — Discharge Summary (Signed)
[name] GUY:[id number] DOB: 1961-07-14 DOA: 10/16/2023  PCP: Patient, No Pcp Per  Admit date: 10/16/2023 Discharge date: 10/19/2023  Time spent: 35 minutes  Recommendations for Outpatient Follow-up:  Close PCP f/u     Discharge Diagnoses:  Principal Problem:   Acute respiratory failure [facility]) Active Problems:   Hypertensive emergency   SIRS (systemic inflammatory response syndrome) (HCC)   NSTEMI (non-ST elevated myocardial infarction) (HCC)   COPD with acute exacerbation (HCC)   Discharge Condition: improved  Diet recommendation: heart healthy  Filed Weights   10/16/23 1550 10/17/23 0353 10/19/23 0511  Weight: 86 kg 85.8 kg 82.1 kg    History of present illness:  From admission h and p Cindy [facility] is a 63 y.o. female with medical history significant of Hypertension, COPD, prediabetes, carotid artery disease, who presents to the ED due to unresponsiveness.   Ms. Demo states that she remembers earlier this morning she went to use the restroom and had noted sudden onset shortness of breath.  She is set up with nebulizer treatment and this did not help her breathing at all.  She asked her son to call 911.  She does not remember anything after this.   Her son at bedside states that they went into the room to check on her and noted that she was passed out not responding to them.   Ms. Mcneeley denies any recent shortness of breath prior to today, chest pain, palpitations.  She notes that she has chronic orthopnea that has been ongoing for years as well as bilateral lower extremity swelling.  She denies any recent fever, chills, cough.  [facility] Course:  Patient presents with acute episode of dyspnea and unresponsiveness. Found to be severely hypertensive with pulmonary edema, so best explanation for findings is flash pulmonary edema from uncontrolled hypertension (patient reports she is not compliant with home amlodipine and chlorthalidone). She was treated with IV diuretics and  her home meds (amlodipine and chlorthalidone) were re-started. BPs now well controlled on those meds and patient's dyspnea and hypoxia are resolved, ambulated independently with PT, no home health needs identified. Also with troponin elevation to the 500s, no overt ischemia seen on EKG and TTE with preserved EF and no wall motion abnormalities, discussed with cardiology, they think this is demand from the above process and did not advise further evaluation. Also with wheeze initially so treated for possible copd exacerbation with steroids, wheezing now resolved. We discussed importance of compliance with home BP meds. Advise close f/u with PCP.   Procedures: none   Consultations: none  Discharge Exam: Vitals:   10/19/23 0855 10/19/23 1126  BP: (!) 149/56 (!) 141/55  Pulse: (!) 55 65  Resp:    Temp: (!) 96.7 F (35.9 C) 98.4 F (36.9 C)  SpO2: 91% 92%    General: NAD Cardiovascular: RRR Respiratory: CTAB  Discharge Instructions   Discharge Instructions     Diet - low sodium heart healthy   Complete by: As directed    Increase activity slowly   Complete by: As directed       Allergies as of 10/19/2023       Reactions   Penicillins    Sulfa Antibiotics         Medication List     STOP taking these medications    furosemide 20 MG tablet Commonly known as: LASIX       TAKE these medications    acetaminophen 325 MG tablet Commonly known as: TYLENOL Take 650 mg by  mouth 3 (three) times daily as needed for mild pain (pain score 1-3).   albuterol (2.5 MG/3ML) 0.083% nebulizer solution Commonly known as: PROVENTIL Take 2.5 mg by nebulization every 4 (four) hours as needed for wheezing or shortness of breath.   amLODipine 10 MG tablet Commonly known as: NORVASC Take 1 tablet (10 mg total) by mouth daily. Start taking on: October 20, 2023   aspirin 81 MG chewable tablet Chew 81 mg by mouth daily.   budesonide-formoterol 160-4.5 MCG/ACT inhaler Commonly  known as: SYMBICORT Inhale 2 puffs into the lungs 2 (two) times daily.   calcium carbonate 500 MG chewable tablet Commonly known as: TUMS - dosed in mg elemental calcium Chew 1 tablet by mouth daily.   calcium citrate 950 (200 Ca) MG tablet Commonly known as: CALCITRATE - dosed in mg elemental calcium Take 200 mg of elemental calcium by mouth daily.   celecoxib 200 MG capsule Commonly known as: CELEBREX Take 200 mg by mouth 2 (two) times daily.   chlorthalidone 25 MG tablet Commonly known as: HYGROTON Take 0.5 tablets (12.5 mg total) by mouth daily. Start taking on: October 20, 2023   cholecalciferol 25 MCG (1000 UNIT) tablet Commonly known as: VITAMIN D3 Take 2,000 Units by mouth daily.   escitalopram 10 MG tablet Commonly known as: LEXAPRO Take 10 mg by mouth daily.   famotidine 20 MG tablet Commonly known as: PEPCID Take 20 mg by mouth 2 (two) times daily.   predniSONE 20 MG tablet Commonly known as: DELTASONE Take 2 tablets (40 mg total) by mouth daily with breakfast for 3 days. Start taking on: October 20, 2023   pregabalin 150 MG capsule Commonly known as: LYRICA Take 150 mg by mouth 2 (two) times daily.   rosuvastatin 20 MG tablet Commonly known as: CRESTOR Take 20 mg by mouth daily.   Spiriva Respimat 2.5 MCG/ACT Aers Generic drug: Tiotropium Bromide Monohydrate Inhale 2 puffs into the lungs daily.       Allergies  Allergen Reactions   Penicillins    Sulfa Antibiotics     Follow-up Information     [facility]                   The results of significant diagnostics from this hospitalization (including imaging, microbiology, ancillary and laboratory) are listed below for reference.    Significant Diagnostic Studies: ECHOCARDIOGRAM COMPLETE Result Date: 10/17/2023    ECHOCARDIOGRAM REPORT   Patient Name:   [facility] Date of Exam: 10/17/2023 Medical Rec #:  [id number]   Height:       67.0 in Accession #:    [id number]   Weight:       189.2 lb Date of Birth:  29-Dec-1960   BSA:          1.975 m Patient Age:    62 years    BP:           145/69 mmHg Patient Gender: F           HR:           71 bpm. Exam Location:  Inpatient Procedure: 2D Echo, Cardiac Doppler and Color Doppler Indications:     A[place]re respiratory distress R06.03  History:         Patient has no prior history of Echocardiogram examinations.                  Previous Myocardial Infarction, COPD; Risk  Factors:Hypertension.  Sonographer:     Lucendia Herrlich RCS Referring Phys:  [id number] Verdene Lennert Diagnosing Phys: Yvonne Kendall MD IMPRESSIONS  1. Left ventricular ejection fraction, by estimation, is 55 to 60%. The left ventricle has normal function. The left ventricle has no regional wall motion abnormalities. Left ventricular diastolic parameters are consistent with Grade I diastolic dysfunction (impaired relaxation).  2. Right ventricular systolic function is normal. The right ventricular size is normal. There is mildly elevated pulmonary artery systolic pressure.  3. The mitral valve is abnormal. Moderate mitral valve regurgitation. No evidence of mitral stenosis.  4. The aortic valve was not well visualized. Aortic valve regurgitation is moderate. No aortic stenosis is present.  5. The inferior vena cava is dilated in size with <50% respiratory variability, suggesting right atrial pressure of 15 mmHg. FINDINGS  Left Ventricle: Left ventricular ejection fraction, by estimation, is 55 to 60%. The left ventricle has normal function. The left ventricle has no regional wall motion abnormalities. The left ventricular internal cavity size was normal in size. There is  no left ventricular hypertrophy. Left ventricular diastolic parameters are consistent with Grade I diastolic dysfunction (impaired relaxation). Right Ventricle: The right ventricular size is normal. No increase in right ventricular wall thickness. Right ventricular systolic function is  normal. There is mildly elevated pulmonary artery systolic pressure. The tricuspid regurgitant velocity is 2.55  m/s, and with an assumed right atrial pressure of 15 mmHg, the estimated right ventricular systolic pressure is 41.0 mmHg. Left Atrium: Left atrial size was normal in size. Right Atrium: Right atrial size was normal in size. Pericardium: There is no evidence of pericardial effusion. Mitral Valve: The mitral valve is abnormal. Moderate mitral valve regurgitation. No evidence of mitral valve stenosis. Tricuspid Valve: The tricuspid valve is not well visualized. Tricuspid valve regurgitation is mild. Aortic Valve: The aortic valve was not well visualized. Aortic valve regurgitation is moderate. Aortic regurgitation PHT measures 266 msec. No aortic stenosis is present. Aortic valve mean gradient measures 8.0 mmHg. Aortic valve peak gradient measures 14.6 mmHg. Aortic valve area, by VTI measures 2.03 cm. Pulmonic Valve: The pulmonic valve was not well visualized. Pulmonic valve regurgitation is mild. No evidence of pulmonic stenosis. Aorta: The aortic root and ascending aorta are structurally normal, with no evidence of dilitation. Pulmonary Artery: The pulmonary artery is not well seen. Venous: The inferior vena cava is dilated in size with less than 50% respiratory variability, suggesting right atrial pressure of 15 mmHg. IAS/Shunts: No atrial level shunt detected by color flow Doppler.  LEFT VENTRICLE PLAX 2D LVIDd:         4.60 cm   Diastology LVIDs:         3.20 cm   LV e' medial:    7.51 cm/s LV PW:         1.00 cm   LV E/e' medial:  15.2 LV IVS:        0.90 cm   LV e' lateral:   7.94 cm/s LVOT diam:     2.00 cm   LV E/e' lateral: 14.4 LV SV:         77 LV SV Index:   39 LVOT Area:     3.14 cm  RIGHT VENTRICLE             IVC RV S prime:     14.60 cm/s  IVC diam: 2.60 cm TAPSE (M-mode): 2.4 cm LEFT ATRIUM  Index        RIGHT ATRIUM           Index LA diam:        4.00 cm 2.03 cm/m   RA  Area:     15.40 cm LA Vol (A2C):   49.7 ml 25.17 ml/m  RA Volume:   36.30 ml  18.38 ml/m LA Vol (A4C):   53.4 ml 27.04 ml/m LA Biplane Vol: 54.1 ml 27.39 ml/m  AORTIC VALVE AV Area (Vmax):    1.90 cm AV Area (Vmean):   1.95 cm AV Area (VTI):     2.03 cm AV Vmax:           191.00 cm/s AV Vmean:          131.000 cm/s AV VTI:            0.380 m AV Peak Grad:      14.6 mmHg AV Mean Grad:      8.0 mmHg LVOT Vmax:         115.33 cm/s LVOT Vmean:        81.467 cm/s LVOT VTI:          0.246 m LVOT/AV VTI ratio: 0.65 AI PHT:            266 msec  AORTA Ao Root diam: 3.00 cm Ao Asc diam:  3.40 cm MITRAL VALVE                TRICUSPID VALVE MV Area (PHT): 4.15 cm     TR Peak grad:   26.0 mmHg MV Decel Time: 183 msec     TR Vmax:        255.00 cm/s MR Peak grad: 91.0 mmHg MR Vmax:      477.00 cm/s   SHUNTS MV E velocity: 114.00 cm/s  Systemic VTI:  0.25 m MV A velocity: 147.00 cm/s  Systemic Diam: 2.00 cm MV E/A ratio:  0.78 Cristal Deer End MD Electronically signed by Yvonne Kendall MD Signature Date/Time: 10/17/2023/3:25:42 PM    Final    CT Angio Chest Pulmonary Embolism (PE) W or WO Contrast Result Date: 10/16/2023 CLINICAL DATA:  Short of breath. Pulmonary embolism (PE) suspected, low to intermediate prob, positive D-dimer EXAM: CT ANGIOGRAPHY CHEST WITH CONTRAST TECHNIQUE: Multidetector CT imaging of the chest was performed using the standard protocol during bolus administration of intravenous contrast. Multiplanar CT image reconstructions and MIPs were obtained to evaluate the vascular anatomy. RADIATION DOSE REDUCTION: This exam was performed according to the departmental dose-optimization program which includes automated exposure control, adjustment of the mA and/or kV according to patient size and/or use of iterative reconstruction technique. CONTRAST:  OMNIPAQUE IOHEXOL 350 MG/ML SOLN COMPARISON:  CT angio chest 10/03/2020 FINDINGS: Cardiovascular: Satisfactory opacification of the pulmonary arteries  to the segmental level. No evidence of pulmonary embolism. The main pulmonary artery measures slightly enlarged in caliber measuring up to 3.1 cm. Normal heart size. No significant pericardial effusion. The thoracic aorta is normal in caliber. Mild atherosclerotic plaque of the thoracic aorta. At least single vessel coronary artery calcifications. Mediastinum/Nodes: No enlarged mediastinal, hilar, or axillary lymph nodes. trachea and esophagus demonstrate no significant findings. Heterogeneous thyroid gland - This has been evaluated on previous imaging. (ref: J Am Coll Radiol. 2015 Feb;12(2): 143-50). vascular clips along the right thyroid gland. Lungs/Pleura: Moderate and severe emphysematous changes. Bilateral mild atelectasis and scarring. Question developing subpleural right upper lobe airspace opacity. No pulmonary nodule. No pulmonary mass. No pleural effusion.  No pneumothorax. Upper Abdomen: No acute abnormality. Musculoskeletal: No chest wall abnormality. No suspicious lytic or blastic osseous lesions. No acute displaced fracture. Review of the MIP images confirms the above findings. IMPRESSION: 1. No pulmonary embolus. 2. Question developing subpleural right upper lobe airspace opacity versus atelectasis/worsening scarring. 3. Enlarged pulmonary artery suggestive of pulmonary hypertension. 4. Aortic Atherosclerosis (ICD10-I70.0) and Emphysema (ICD10-J43.9). Electronically Signed   By: Tish Frederickson M.D.   On: 10/16/2023 23:32   DG Chest Port 1 View Result Date: 10/16/2023 CLINICAL DATA:  Shortness of breath. EXAM: PORTABLE CHEST 1 VIEW COMPARISON:  Chest radiograph dated 09/20/2023. FINDINGS: Right upper lobe reticulonodular densities most consistent with infiltrate. Clinical correlation is recommended. Left lung base atelectasis/scarring. No large pleural effusion. No pneumothorax. Stable cardiac silhouette. No acute osseous pathology. Surgical clips over the neck. IMPRESSION: Right upper lobe  infiltrate. Electronically Signed   By: Elgie Collard M.D.   On: 10/16/2023 11:54    Microbiology: Recent Results (from the past 240 hours)  Culture, blood (Routine x 2)     Status: None (Preliminary result)   Collection Time: 10/16/23 11:20 AM   Specimen: BLOOD  Result Value Ref Range Status   Specimen Description BLOOD L HAND  Final   Special Requests   Final    BOTTLES DRAWN AEROBIC AND ANAEROBIC Blood Culture results may not be optimal due to an inadequate volume of blood received in culture bottles   Culture   Final    NO GROWTH 3 DAYS Performed at [facility], [address], [place], [place] [address]    Report Status PENDING  Incomplete  Resp panel by RT-PCR (RSV, Flu A&B, Covid) Anterior Nasal Swab     Status: None   Collection Time: 10/16/23 12:49 PM   Specimen: Anterior Nasal Swab  Result Value Ref Range Status   SARS Coronavirus 2 by RT PCR NEGATIVE NEGATIVE Final    Comment: (NOTE) SARS-CoV-2 target nucleic acids are NOT DETECTED.  The SARS-CoV-2 RNA is generally detectable in upper respiratory specimens during the acute phase of infection. The lowest concentration of SARS-CoV-2 viral copies this assay can detect is 138 copies/mL. A negative result does not preclude SARS-Cov-2 infection and should not be used as the sole basis for treatment or other patient management decisions. A negative result may occur with  improper specimen collection/handling, submission of specimen other than nasopharyngeal swab, presence of viral mutation(s) within the areas targeted by this assay, and inadequate number of viral copies(<138 copies/mL). A negative result must be combined with clinical observations, patient history, and epidemiological information. The expected result is Negative.  Fact Sheet for Patients:  BloggerCourse.com  Fact Sheet for Healthcare Providers:  SeriousBroker.it  This test is no t yet  approved or cleared by the [place] FDA and  has been authorized for detection and/or diagnosis of SARS-CoV-2 by FDA under an Emergency Use Authorization (EUA). This EUA will remain  in effect (meaning this test can be used) for the duration of the COVID-19 declaration under Section 564(b)(1) of the Act, 21 U.S.C.section 360bbb-3(b)(1), unless the authorization is terminated  or revoked sooner.       Influenza A by PCR NEGATIVE NEGATIVE Final   Influenza B by PCR NEGATIVE NEGATIVE Final    Comment: (NOTE) The Xpert Xpress SARS-CoV-2/FLU/RSV plus assay is intended as an aid in the diagnosis of influenza from Nasopharyngeal swab specimens and should not be used as a sole basis for treatment. Nasal washings and aspirates are unacceptable for  Xpert Xpress SARS-CoV-2/FLU/RSV testing.  Fact Sheet for Patients: BloggerCourse.com  Fact Sheet for Healthcare Providers: SeriousBroker.it  This test is not yet approved or cleared by the [place] FDA and has been authorized for detection and/or diagnosis of SARS-CoV-2 by FDA under an Emergency Use Authorization (EUA). This EUA will remain in effect (meaning this test can be used) for the duration of the COVID-19 declaration under Section 564(b)(1) of the Act, 21 U.S.C. section 360bbb-3(b)(1), unless the authorization is terminated or revoked.     Resp Syncytial Virus by PCR NEGATIVE NEGATIVE Final    Comment: (NOTE) Fact Sheet for Patients: BloggerCourse.com  Fact Sheet for Healthcare Providers: SeriousBroker.it  This test is not yet approved or cleared by the [place] FDA and has been authorized for detection and/or diagnosis of SARS-CoV-2 by FDA under an Emergency Use Authorization (EUA). This EUA will remain in effect (meaning this test can be used) for the duration of the COVID-19 declaration under Section 564(b)(1) of  the Act, 21 U.S.C. section 360bbb-3(b)(1), unless the authorization is terminated or revoked.  Performed at [facility], [address] [place], [place] [address]   Culture, blood (Routine x 2)     Status: None (Preliminary result)   Collection Time: 10/16/23  4:21 PM   Specimen: BLOOD  Result Value Ref Range Status   Specimen Description BLOOD BLOOD LEFT ARM  Final   Special Requests   Final    BLOOD BOTTLES DRAWN AEROBIC AND ANAEROBIC Blood Culture results may not be optimal due to an inadequate volume of blood received in culture bottles   Culture   Final    NO GROWTH 3 DAYS Performed at [facility], [address] [place], [place] [address]    Report Status PENDING  Incomplete  Surgical pcr screen     Status: None   Collection Time: 10/16/23  6:23 PM   Specimen: Nasal Mucosa; Nasal Swab  Result Value Ref Range Status   MRSA, PCR NEGATIVE NEGATIVE Final   Staphylococcus aureus NEGATIVE NEGATIVE Final    Comment: (NOTE) The Xpert SA Assay (FDA approved for NASAL specimens in patients 54 years of age and older), is one component of a comprehensive surveillance program. It is not intended to diagnose infection nor to guide or monitor treatment. Performed at [facility], [address] [place], [place] [address]   Respiratory (~20 pathogens) panel by PCR     Status: None   Collection Time: 10/16/23 11:23 PM   Specimen: Nasopharyngeal Swab; Respiratory  Result Value Ref Range Status   Adenovirus NOT DETECTED NOT DETECTED Final   Coronavirus 229E NOT DETECTED NOT DETECTED Final    Comment: (NOTE) The Coronavirus on the Respiratory Panel, DOES NOT test for the novel  Coronavirus (2019 nCoV)    Coronavirus HKU1 NOT DETECTED NOT DETECTED Final   Coronavirus NL63 NOT DETECTED NOT DETECTED Final   Coronavirus OC43 NOT DETECTED NOT DETECTED Final   Metapneumovirus NOT DETECTED NOT DETECTED Final   Rhinovirus / Enterovirus NOT DETECTED NOT  DETECTED Final   Influenza A NOT DETECTED NOT DETECTED Final   Influenza B NOT DETECTED NOT DETECTED Final   Parainfluenza Virus 1 NOT DETECTED NOT DETECTED Final   Parainfluenza Virus 2 NOT DETECTED NOT DETECTED Final   Parainfluenza Virus 3 NOT DETECTED NOT DETECTED Final   Parainfluenza Virus 4 NOT DETECTED NOT DETECTED Final   Respiratory Syncytial Virus NOT DETECTED NOT DETECTED Final   Bordetella pertussis NOT DETECTED NOT DETECTED  Final   Bordetella Parapertussis NOT DETECTED NOT DETECTED Final   Chlamydophila pneumoniae NOT DETECTED NOT DETECTED Final   Mycoplasma pneumoniae NOT DETECTED NOT DETECTED Final    Comment: Performed at [facility], [address], [place], [place] [address]     Labs: Basic Metabolic Panel: Recent Labs  Lab 10/16/23 1053 10/17/23 0714 10/19/23 0448  NA 142 143 142  K 5.0 4.2 3.2*  CL 108 106 101  CO2 23 26 30   GLUCOSE 247* 128* 85  BUN 14 19 30*  CREATININE 0.87 0.86 0.96  CALCIUM 8.7* 8.7* 8.6*   Liver Function Tests: Recent Labs  Lab 10/16/23 1053  AST 33  ALT 16  ALKPHOS 114  BILITOT 0.5  PROT 6.9  ALBUMIN 3.5   No results for input(s): "LIPASE", "AMYLASE" in the last 168 hours. No results for input(s): "AMMONIA" in the last 168 hours. CBC: Recent Labs  Lab 10/16/23 1053 10/17/23 0714  WBC 15.2* 10.3  NEUTROABS 9.7* 9.3*  HGB 12.2 11.0*  HCT 40.8 33.9*  MCV 91.3 84.8  PLT 445* 318   Cardiac Enzymes: No results for input(s): "CKTOTAL", "CKMB", "CKMBINDEX", "TROPONINI" in the last 168 hours. BNP: BNP (last 3 results) Recent Labs    10/16/23 1053  BNP 810.3*    ProBNP (last 3 results) No results for input(s): "PROBNP" in the last 8760 hours.  CBG: Recent Labs  Lab 10/16/23 1046 10/16/23 1545  GLUCAP 217* 112*       Signed:  Silvano Bilis MD.  [facility] 10/19/2023, 12:14 PM

## 2023-10-21 LAB — CULTURE, BLOOD (ROUTINE X 2)
Culture: NO GROWTH
Culture: NO GROWTH

## 2023-11-18 ENCOUNTER — Other Ambulatory Visit: Payer: Self-pay

## 2023-11-18 ENCOUNTER — Inpatient Hospital Stay
Admission: EM | Admit: 2023-11-18 | Discharge: 2023-11-20 | DRG: 189 | Disposition: A | Discharge status: Home / Self Care | Payer: No Typology Code available for payment source | Attending: Student | Admitting: Student

## 2023-11-18 ENCOUNTER — Encounter: Payer: Self-pay | Admitting: Emergency Medicine

## 2023-11-18 ENCOUNTER — Inpatient Hospital Stay: Payer: No Typology Code available for payment source

## 2023-11-18 ENCOUNTER — Emergency Department: Payer: No Typology Code available for payment source

## 2023-11-18 DIAGNOSIS — I110 Hypertensive heart disease with heart failure: Secondary | ICD-10-CM | POA: Diagnosis present

## 2023-11-18 DIAGNOSIS — I2510 Atherosclerotic heart disease of native coronary artery without angina pectoris: Secondary | ICD-10-CM | POA: Diagnosis present

## 2023-11-18 DIAGNOSIS — Z91048 Other nonmedicinal substance allergy status: Secondary | ICD-10-CM

## 2023-11-18 DIAGNOSIS — J9601 Acute respiratory failure with hypoxia: Secondary | ICD-10-CM | POA: Diagnosis present

## 2023-11-18 DIAGNOSIS — E890 Postprocedural hypothyroidism: Secondary | ICD-10-CM | POA: Diagnosis present

## 2023-11-18 DIAGNOSIS — Z7982 Long term (current) use of aspirin: Secondary | ICD-10-CM | POA: Diagnosis not present

## 2023-11-18 DIAGNOSIS — Z8619 Personal history of other infectious and parasitic diseases: Secondary | ICD-10-CM

## 2023-11-18 DIAGNOSIS — Z820 Family history of epilepsy and other diseases of the nervous system: Secondary | ICD-10-CM | POA: Diagnosis not present

## 2023-11-18 DIAGNOSIS — Z791 Long term (current) use of non-steroidal anti-inflammatories (NSAID): Secondary | ICD-10-CM

## 2023-11-18 DIAGNOSIS — J309 Allergic rhinitis, unspecified: Secondary | ICD-10-CM | POA: Diagnosis present

## 2023-11-18 DIAGNOSIS — Z7951 Long term (current) use of inhaled steroids: Secondary | ICD-10-CM

## 2023-11-18 DIAGNOSIS — I272 Pulmonary hypertension, unspecified: Secondary | ICD-10-CM | POA: Diagnosis present

## 2023-11-18 DIAGNOSIS — J441 Chronic obstructive pulmonary disease with (acute) exacerbation: Secondary | ICD-10-CM | POA: Diagnosis present

## 2023-11-18 DIAGNOSIS — I2489 Other forms of acute ischemic heart disease: Secondary | ICD-10-CM | POA: Diagnosis present

## 2023-11-18 DIAGNOSIS — I5032 Chronic diastolic (congestive) heart failure: Secondary | ICD-10-CM | POA: Diagnosis present

## 2023-11-18 DIAGNOSIS — R911 Solitary pulmonary nodule: Secondary | ICD-10-CM | POA: Diagnosis present

## 2023-11-18 DIAGNOSIS — Z79899 Other long term (current) drug therapy: Secondary | ICD-10-CM | POA: Diagnosis not present

## 2023-11-18 DIAGNOSIS — Z8249 Family history of ischemic heart disease and other diseases of the circulatory system: Secondary | ICD-10-CM

## 2023-11-18 DIAGNOSIS — Z880 Allergy status to penicillin: Secondary | ICD-10-CM

## 2023-11-18 DIAGNOSIS — I080 Rheumatic disorders of both mitral and aortic valves: Secondary | ICD-10-CM | POA: Diagnosis present

## 2023-11-18 DIAGNOSIS — R7303 Prediabetes: Secondary | ICD-10-CM | POA: Diagnosis present

## 2023-11-18 DIAGNOSIS — G629 Polyneuropathy, unspecified: Secondary | ICD-10-CM | POA: Diagnosis present

## 2023-11-18 DIAGNOSIS — Z1152 Encounter for screening for COVID-19: Secondary | ICD-10-CM

## 2023-11-18 DIAGNOSIS — F32A Depression, unspecified: Secondary | ICD-10-CM | POA: Diagnosis present

## 2023-11-18 DIAGNOSIS — Z888 Allergy status to other drugs, medicaments and biological substances status: Secondary | ICD-10-CM

## 2023-11-18 DIAGNOSIS — Z9889 Other specified postprocedural states: Secondary | ICD-10-CM | POA: Diagnosis not present

## 2023-11-18 DIAGNOSIS — Z9851 Tubal ligation status: Secondary | ICD-10-CM

## 2023-11-18 DIAGNOSIS — Z882 Allergy status to sulfonamides status: Secondary | ICD-10-CM

## 2023-11-18 DIAGNOSIS — M47812 Spondylosis without myelopathy or radiculopathy, cervical region: Secondary | ICD-10-CM | POA: Diagnosis present

## 2023-11-18 LAB — CBC WITH DIFFERENTIAL/PLATELET
Abs Immature Granulocytes: 0.09 10*3/uL — ABNORMAL HIGH (ref 0.00–0.07)
Basophils Absolute: 0.1 10*3/uL (ref 0.0–0.1)
Basophils Relative: 1 %
Eosinophils Absolute: 0.1 10*3/uL (ref 0.0–0.5)
Eosinophils Relative: 0 %
HCT: 36.2 % (ref 36.0–46.0)
Hemoglobin: 11.3 g/dL — ABNORMAL LOW (ref 12.0–15.0)
Immature Granulocytes: 1 %
Lymphocytes Relative: 7 %
Lymphs Abs: 1.1 10*3/uL (ref 0.7–4.0)
MCH: 27.5 pg (ref 26.0–34.0)
MCHC: 31.2 g/dL (ref 30.0–36.0)
MCV: 88.1 fL (ref 80.0–100.0)
Monocytes Absolute: 0.4 10*3/uL (ref 0.1–1.0)
Monocytes Relative: 3 %
Neutro Abs: 12.9 10*3/uL — ABNORMAL HIGH (ref 1.7–7.7)
Neutrophils Relative %: 88 %
Platelets: 268 10*3/uL (ref 150–400)
RBC: 4.11 MIL/uL (ref 3.87–5.11)
RDW: 15.3 % (ref 11.5–15.5)
WBC: 14.6 10*3/uL — ABNORMAL HIGH (ref 4.0–10.5)
nRBC: 0 % (ref 0.0–0.2)

## 2023-11-18 LAB — CBG MONITORING, ED
Glucose-Capillary: 206 mg/dL — ABNORMAL HIGH (ref 70–99)
Glucose-Capillary: 296 mg/dL — ABNORMAL HIGH (ref 70–99)

## 2023-11-18 LAB — BLOOD GAS, VENOUS
Acid-base deficit: 1.4 mmol/L (ref 0.0–2.0)
Bicarbonate: 27.4 mmol/L (ref 20.0–28.0)
O2 Saturation: 90 %
Patient temperature: 37
pCO2, Ven: 64 mm[Hg] — ABNORMAL HIGH (ref 44–60)
pH, Ven: 7.24 — ABNORMAL LOW (ref 7.25–7.43)
pO2, Ven: 62 mm[Hg] — ABNORMAL HIGH (ref 32–45)

## 2023-11-18 LAB — BASIC METABOLIC PANEL
Anion gap: 9 (ref 5–15)
BUN: 14 mg/dL (ref 8–23)
CO2: 25 mmol/L (ref 22–32)
Calcium: 8.5 mg/dL — ABNORMAL LOW (ref 8.9–10.3)
Chloride: 109 mmol/L (ref 98–111)
Creatinine, Ser: 0.72 mg/dL (ref 0.44–1.00)
GFR, Estimated: >60 mL/min (ref >60)
Glucose, Bld: 189 mg/dL — ABNORMAL HIGH (ref 70–99)
Potassium: 3.7 mmol/L (ref 3.5–5.1)
Sodium: 143 mmol/L (ref 135–145)

## 2023-11-18 LAB — URINALYSIS, ROUTINE W REFLEX MICROSCOPIC
Bilirubin Urine: NEGATIVE
Glucose, UA: NEGATIVE mg/dL
Hgb urine dipstick: NEGATIVE
Ketones, ur: NEGATIVE mg/dL
Leukocytes,Ua: NEGATIVE
Nitrite: NEGATIVE
Protein, ur: 100 mg/dL — AB
Specific Gravity, Urine: 1.016 (ref 1.005–1.030)
pH: 5 (ref 5.0–8.0)

## 2023-11-18 LAB — RESPIRATORY PANEL BY PCR

## 2023-11-18 LAB — RESP PANEL BY RT-PCR (RSV, FLU A&B, COVID)  RVPGX2
Influenza A by PCR: NEGATIVE
Influenza B by PCR: NEGATIVE
Resp Syncytial Virus by PCR: NEGATIVE
SARS Coronavirus 2 by RT PCR: NEGATIVE

## 2023-11-18 LAB — TSH: TSH: 1.252 u[IU]/mL (ref 0.350–4.500)

## 2023-11-18 LAB — IRON AND TIBC
Iron: 28 ug/dL (ref 28–170)
Saturation Ratios: 9 % — ABNORMAL LOW (ref 10.4–31.8)
TIBC: 319 ug/dL (ref 250–450)
UIBC: 291 ug/dL

## 2023-11-18 LAB — VITAMIN B12: Vitamin B-12: 223 pg/mL (ref 180–914)

## 2023-11-18 LAB — VITAMIN D 25 HYDROXY (VIT D DEFICIENCY, FRACTURES): Vit D, 25-Hydroxy: 18.76 ng/mL — ABNORMAL LOW (ref 30–100)

## 2023-11-18 LAB — BRAIN NATRIURETIC PEPTIDE: B Natriuretic Peptide: 660.6 pg/mL — ABNORMAL HIGH (ref 0.0–100.0)

## 2023-11-18 LAB — MAGNESIUM: Magnesium: 1.8 mg/dL (ref 1.7–2.4)

## 2023-11-18 LAB — PHOSPHORUS: Phosphorus: 4.9 mg/dL — ABNORMAL HIGH (ref 2.5–4.6)

## 2023-11-18 LAB — TROPONIN I (HIGH SENSITIVITY)
Troponin I (High Sensitivity): 19 ng/L — ABNORMAL HIGH (ref <18)
Troponin I (High Sensitivity): 37 ng/L — ABNORMAL HIGH (ref <18)

## 2023-11-18 LAB — FOLATE: Folate: 8.9 ng/mL (ref >5.9)

## 2023-11-18 LAB — LACTIC ACID, PLASMA: Lactic Acid, Venous: 1.3 mmol/L (ref 0.5–1.9)

## 2023-11-18 LAB — D-DIMER, QUANTITATIVE: D-Dimer, Quant: 0.98 ug{FEU}/mL — ABNORMAL HIGH (ref 0.00–0.50)

## 2023-11-18 LAB — HEMOGLOBIN A1C
Hgb A1c MFr Bld: 5.5 % (ref 4.8–5.6)
Mean Plasma Glucose: 111.15 mg/dL

## 2023-11-18 MED ORDER — SODIUM CHLORIDE 0.9 % IV SOLN: 250.0000 mL | INTRAVENOUS | Status: AC | PRN

## 2023-11-18 MED ORDER — ESCITALOPRAM OXALATE 10 MG PO TABS
10.0000 mg | ORAL_TABLET | Freq: Every day | ORAL | Status: DC
  Administered 2023-11-18 – 2023-11-20 (×3): 10 mg via ORAL
  Filled 2023-11-18 (×3): qty 1

## 2023-11-18 MED ORDER — METHOCARBAMOL 1000 MG/10ML IJ SOLN: 500.0000 mg | Freq: Four times a day (QID) | INTRAMUSCULAR | Status: DC | PRN

## 2023-11-18 MED ORDER — SODIUM CHLORIDE 0.9% FLUSH: 3.0000 mL | INTRAVENOUS | Status: DC | PRN

## 2023-11-18 MED ORDER — BUDESONIDE 0.25 MG/2ML IN SUSP
0.2500 mg | Freq: Two times a day (BID) | RESPIRATORY_TRACT | Status: DC
  Administered 2023-11-18 – 2023-11-20 (×5): 0.25 mg via RESPIRATORY_TRACT
  Filled 2023-11-18 (×5): qty 2

## 2023-11-18 MED ORDER — FUROSEMIDE 10 MG/ML IJ SOLN
40.0000 mg | Freq: Two times a day (BID) | INTRAMUSCULAR | Status: DC
  Administered 2023-11-18 – 2023-11-20 (×4): 40 mg via INTRAVENOUS
  Filled 2023-11-18 (×4): qty 4

## 2023-11-18 MED ORDER — ALBUTEROL SULFATE (2.5 MG/3ML) 0.083% IN NEBU
2.5000 mg | INHALATION_SOLUTION | Freq: Once | RESPIRATORY_TRACT | Status: AC
  Administered 2023-11-18: 2.5 mg via RESPIRATORY_TRACT
  Filled 2023-11-18: qty 3

## 2023-11-18 MED ORDER — SODIUM CHLORIDE 0.9% FLUSH
3.0000 mL | Freq: Two times a day (BID) | INTRAVENOUS | Status: DC
Start: 2023-11-18 — End: 2023-11-20
  Administered 2023-11-18 – 2023-11-20 (×5): 3 mL via INTRAVENOUS

## 2023-11-18 MED ORDER — ENOXAPARIN SODIUM 40 MG/0.4ML IJ SOSY
40.0000 mg | PREFILLED_SYRINGE | INTRAMUSCULAR | Status: DC
  Filled 2023-11-18 (×2): qty 0.4

## 2023-11-18 MED ORDER — ARFORMOTEROL TARTRATE 15 MCG/2ML IN NEBU
15.0000 ug | INHALATION_SOLUTION | Freq: Two times a day (BID) | RESPIRATORY_TRACT | Status: DC
  Administered 2023-11-18 – 2023-11-20 (×4): 15 ug via RESPIRATORY_TRACT
  Filled 2023-11-18 (×6): qty 2

## 2023-11-18 MED ORDER — BISACODYL 5 MG PO TBEC: 5.0000 mg | DELAYED_RELEASE_TABLET | Freq: Every day | ORAL | Status: DC | PRN

## 2023-11-18 MED ORDER — METHYLPREDNISOLONE SODIUM SUCC 125 MG IJ SOLR
125.0000 mg | Freq: Once | INTRAMUSCULAR | Status: AC
  Administered 2023-11-18: 125 mg via INTRAVENOUS
  Filled 2023-11-18: qty 2

## 2023-11-18 MED ORDER — SODIUM CHLORIDE 0.9 % IV SOLN
500.0000 mg | INTRAVENOUS | Status: DC
  Administered 2023-11-18 – 2023-11-19 (×2): 500 mg via INTRAVENOUS
  Filled 2023-11-18 (×3): qty 5

## 2023-11-18 MED ORDER — CALCIUM CARBONATE ANTACID 500 MG PO CHEW
1.0000 | CHEWABLE_TABLET | Freq: Every day | ORAL | Status: DC
  Administered 2023-11-19 – 2023-11-20 (×2): 200 mg via ORAL
  Filled 2023-11-18 (×2): qty 1

## 2023-11-18 MED ORDER — METHYLPREDNISOLONE SODIUM SUCC 40 MG IJ SOLR
40.0000 mg | Freq: Two times a day (BID) | INTRAMUSCULAR | Status: DC
  Administered 2023-11-18 – 2023-11-20 (×4): 40 mg via INTRAVENOUS
  Filled 2023-11-18 (×4): qty 1

## 2023-11-18 MED ORDER — ASPIRIN 81 MG PO CHEW
81.0000 mg | CHEWABLE_TABLET | Freq: Every day | ORAL | Status: DC
  Administered 2023-11-18 – 2023-11-20 (×3): 81 mg via ORAL
  Filled 2023-11-18 (×3): qty 1

## 2023-11-18 MED ORDER — POLYETHYLENE GLYCOL 3350 17 G PO PACK: 17.0000 g | PACK | Freq: Every day | ORAL | Status: DC | PRN

## 2023-11-18 MED ORDER — ACETAMINOPHEN 650 MG RE SUPP: 650.0000 mg | Freq: Four times a day (QID) | RECTAL | Status: DC | PRN

## 2023-11-18 MED ORDER — PANTOPRAZOLE SODIUM 40 MG PO TBEC
40.0000 mg | DELAYED_RELEASE_TABLET | Freq: Every day | ORAL | Status: DC
  Administered 2023-11-18 – 2023-11-20 (×3): 40 mg via ORAL
  Filled 2023-11-18 (×3): qty 1

## 2023-11-18 MED ORDER — INSULIN ASPART 100 UNIT/ML IJ SOLN
0.0000 [IU] | Freq: Three times a day (TID) | INTRAMUSCULAR | Status: DC
  Administered 2023-11-18: 5 [IU] via SUBCUTANEOUS
  Administered 2023-11-19 (×2): 2 [IU] via SUBCUTANEOUS
  Administered 2023-11-20: 3 [IU] via SUBCUTANEOUS
  Filled 2023-11-18 (×4): qty 1

## 2023-11-18 MED ORDER — PREGABALIN 75 MG PO CAPS: 150.0000 mg | ORAL_CAPSULE | Freq: Two times a day (BID) | ORAL | Status: DC

## 2023-11-18 MED ORDER — MAGNESIUM SULFATE 2 GM/50ML IV SOLN
2.0000 g | Freq: Once | INTRAVENOUS | Status: AC
  Administered 2023-11-18: 2 g via INTRAVENOUS
  Filled 2023-11-18: qty 50

## 2023-11-18 MED ORDER — PREGABALIN 75 MG PO CAPS
150.0000 mg | ORAL_CAPSULE | Freq: Once | ORAL | Status: AC
  Administered 2023-11-18: 150 mg via ORAL
  Filled 2023-11-18: qty 2

## 2023-11-18 MED ORDER — AMLODIPINE BESYLATE 5 MG PO TABS
10.0000 mg | ORAL_TABLET | Freq: Every day | ORAL | Status: DC
  Administered 2023-11-18 – 2023-11-20 (×3): 10 mg via ORAL
  Filled 2023-11-18 (×3): qty 2

## 2023-11-18 MED ORDER — ROSUVASTATIN CALCIUM 20 MG PO TABS
20.0000 mg | ORAL_TABLET | Freq: Every day | ORAL | Status: DC
  Administered 2023-11-18 – 2023-11-20 (×3): 20 mg via ORAL
  Filled 2023-11-18 (×4): qty 1

## 2023-11-18 MED ORDER — ACETAMINOPHEN 325 MG PO TABS
650.0000 mg | ORAL_TABLET | Freq: Four times a day (QID) | ORAL | Status: DC | PRN
  Administered 2023-11-18 – 2023-11-20 (×6): 650 mg via ORAL
  Filled 2023-11-18 (×7): qty 2

## 2023-11-18 MED ORDER — IPRATROPIUM-ALBUTEROL 0.5-2.5 (3) MG/3ML IN SOLN
3.0000 mL | Freq: Four times a day (QID) | RESPIRATORY_TRACT | Status: DC
  Administered 2023-11-18 (×2): 3 mL via RESPIRATORY_TRACT
  Filled 2023-11-18 (×2): qty 3

## 2023-11-18 MED ORDER — PREGABALIN 50 MG PO CAPS
150.0000 mg | ORAL_CAPSULE | Freq: Two times a day (BID) | ORAL | Status: DC
  Administered 2023-11-18 – 2023-11-20 (×4): 150 mg via ORAL
  Filled 2023-11-18 (×4): qty 3

## 2023-11-18 MED ORDER — CELECOXIB 200 MG PO CAPS
200.0000 mg | ORAL_CAPSULE | Freq: Two times a day (BID) | ORAL | Status: DC
  Administered 2023-11-18 – 2023-11-20 (×4): 200 mg via ORAL
  Filled 2023-11-18 (×5): qty 1

## 2023-11-18 NOTE — ED Notes (Signed)
Pt called RN and states she felt SOB, listened and assessed lung sounds and placed Pt on 2L [place]

## 2023-11-18 NOTE — ED Triage Notes (Signed)
Pt via [facility] from home. Pt c/o SOB, pt has hx of COPD exab. Reports this AM started having increasing SOB. Pt was admitted to the ICU for same recently. Per EMS, dimished lung sounds, 1 albuterol inhaler, 2 duoneb. Denies any pain. Pt is A&Ox4 but on CPAP on arrival, pt still tripoding, tachypneic.  EMS reports 180/100 BP, 68% on RA and increased to 98% on CPAP

## 2023-11-18 NOTE — ED Notes (Signed)
All 1300 due medications not yet verified.

## 2023-11-18 NOTE — ED Notes (Signed)
Pt ambulated to bathroom with steady even gait - upon returning to the bathroom pt was short of breath - SpO2 was 96%, however this RN administered 1L [place] for comfort which helped the pt.

## 2023-11-18 NOTE — ED Provider Notes (Signed)
[facility] Provider Note    Event Date/Time   First MD Initiated Contact with Patient 11/18/23 1027     (approximate)   History   Respiratory Distress   HPI  Cindy Gutierrez is a 63 y.o. female with a history of hypertension, COPD, CAD, and prediabetes who presents with shortness of breath, acute onset this morning, associated with cough.  The patient denies chest pain.  Per EMS, on their arrival, her O2 saturation was in the 60s and the patient was in a tripod position in acute respiratory distress.  I reviewed the past medical records.  The patient was admitted to the hospitalist service last month after presenting with acute pulmonary edema thought to be due to severe hypertension.  She was treated with antihypertensives and diuretics.   Physical Exam   Triage Vital Signs: ED Triage Vitals [11/18/23 1026]  Encounter Vitals Group     BP      Systolic BP Percentile      Diastolic BP Percentile      Pulse      Resp      Temp      Temp src      SpO2      Weight 180 lb (81.6 kg)     Height 5\' 8"  (1.727 m)     Head Circumference      Peak Flow      Pain Score 0     Pain Loc      Pain Education      Exclude from Growth Chart     Most recent vital signs: Vitals:   11/18/23 1452 11/18/23 1517  BP:  (!) 170/57  Pulse:    Resp:    Temp: 98.6 F (37 C)   SpO2:       General: Alert, acute respiratory distress, speaking in short sentences. CV:  Good peripheral perfusion.  Resp:  Increased respiratory effort.  Diminished breath sounds bilaterally with faint wheezing.  No rales. Abd:  No distention.  Other:  Trace bilateral lower extremity edema.   ED Results / Procedures / Treatments   Labs (all labs ordered are listed, but only abnormal results are displayed) Labs Reviewed  URINALYSIS, ROUTINE W REFLEX MICROSCOPIC - Abnormal; Notable for the following components:      Result Value   Color, Urine YELLOW (*)    APPearance CLEAR (*)     Protein, ur 100 (*)    Bacteria, UA RARE (*)    All other components within normal limits  BASIC METABOLIC PANEL - Abnormal; Notable for the following components:   Glucose, Bld 189 (*)    Calcium 8.5 (*)    All other components within normal limits  CBC WITH DIFFERENTIAL/PLATELET - Abnormal; Notable for the following components:   WBC 14.6 (*)    Hemoglobin 11.3 (*)    Neutro Abs 12.9 (*)    Abs Immature Granulocytes 0.09 (*)    All other components within normal limits  BRAIN NATRIURETIC PEPTIDE - Abnormal; Notable for the following components:   B Natriuretic Peptide 660.6 (*)    All other components within normal limits  BLOOD GAS, VENOUS - Abnormal; Notable for the following components:   pH, Ven 7.24 (*)    pCO2, Ven 64 (*)    pO2, Ven 62 (*)    All other components within normal limits  PHOSPHORUS - Abnormal; Notable for the following components:   Phosphorus 4.9 (*)    All other  components within normal limits  IRON AND TIBC - Abnormal; Notable for the following components:   Saturation Ratios 9 (*)    All other components within normal limits  D-DIMER, QUANTITATIVE - Abnormal; Notable for the following components:   D-Dimer, Quant 0.98 (*)    All other components within normal limits  TROPONIN I (HIGH SENSITIVITY) - Abnormal; Notable for the following components:   Troponin I (High Sensitivity) 19 (*)    All other components within normal limits  TROPONIN I (HIGH SENSITIVITY) - Abnormal; Notable for the following components:   Troponin I (High Sensitivity) 37 (*)    All other components within normal limits  RESP PANEL BY RT-PCR (RSV, FLU A&B, COVID)  RVPGX2  CULTURE, BLOOD (ROUTINE X 2)  CULTURE, BLOOD (ROUTINE X 2)  RESPIRATORY PANEL BY PCR  LACTIC ACID, PLASMA  MAGNESIUM  FOLATE  TSH  VITAMIN B12  VITAMIN D 25 HYDROXY (VIT D DEFICIENCY, FRACTURES)  HEMOGLOBIN A1C     EKG  ED ECG REPORT I, Dionne Bucy, the attending physician, personally viewed  and interpreted this ECG.  Date: 11/18/2023 EKG Time: 1047 Rate: 81 Rhythm: normal sinus rhythm QRS Axis: normal Intervals: normal ST/T Wave abnormalities: normal Narrative Interpretation: no evidence of acute ischemia    RADIOLOGY  Chest x-ray: I independently viewed and interpreted the images; there is no focal consolidation or edema   PROCEDURES:  Critical Care performed: Yes, see critical care procedure note(s)  .Critical Care  Performed by: Dionne Bucy, MD Authorized by: Dionne Bucy, MD   Critical care provider statement:    Critical care time (minutes):  30   Critical care time was exclusive of:  Separately billable procedures and treating other patients   Critical care was necessary to treat or prevent imminent or life-threatening deterioration of the following conditions:  Respiratory failure   Critical care was time spent personally by me on the following activities:  Development of treatment plan with patient or surrogate, discussions with consultants, evaluation of patient's response to treatment, examination of patient, ordering and review of laboratory studies, ordering and review of radiographic studies, ordering and performing treatments and interventions, pulse oximetry, re-evaluation of patient's condition, review of old charts and obtaining history from patient or surrogate   Care discussed with: admitting provider      MEDICATIONS ORDERED IN ED: Medications  enoxaparin (LOVENOX) injection 40 mg (has no administration in time range)  sodium chloride flush (NS) 0.9 % injection 3 mL (3 mLs Intravenous Given 11/18/23 1316)  sodium chloride flush (NS) 0.9 % injection 3 mL (has no administration in time range)  0.9 %  sodium chloride infusion (has no administration in time range)  acetaminophen (TYLENOL) tablet 650 mg (650 mg Oral Given 11/18/23 1352)    Or  acetaminophen (TYLENOL) suppository 650 mg ( Rectal See Alternative 11/18/23 1352)   methocarbamol (ROBAXIN) injection 500 mg (has no administration in time range)  polyethylene glycol (MIRALAX / GLYCOLAX) packet 17 g (has no administration in time range)  bisacodyl (DULCOLAX) EC tablet 5 mg (has no administration in time range)  methylPREDNISolone sodium succinate (SOLU-MEDROL) 40 mg/mL injection 40 mg (has no administration in time range)  pantoprazole (PROTONIX) EC tablet 40 mg (40 mg Oral Given 11/18/23 1311)  furosemide (LASIX) injection 40 mg (40 mg Intravenous Given 11/18/23 1312)  insulin aspart (novoLOG) injection 0-15 Units (has no administration in time range)  arformoterol (BROVANA) nebulizer solution 15 mcg (15 mcg Nebulization Given 11/18/23 1408)  budesonide (PULMICORT) nebulizer  solution 0.25 mg (0.25 mg Nebulization Given 11/18/23 1352)  ipratropium-albuterol (DUONEB) 0.5-2.5 (3) MG/3ML nebulizer solution 3 mL (3 mLs Nebulization Given 11/18/23 1312)  amLODipine (NORVASC) tablet 10 mg (10 mg Oral Given 11/18/23 1517)  aspirin chewable tablet 81 mg (81 mg Oral Given 11/18/23 1516)  calcium carbonate (TUMS - dosed in mg elemental calcium) chewable tablet 200 mg of elemental calcium (has no administration in time range)  celecoxib (CELEBREX) capsule 200 mg (has no administration in time range)  escitalopram (LEXAPRO) tablet 10 mg (10 mg Oral Given 11/18/23 1517)  rosuvastatin (CRESTOR) tablet 20 mg (20 mg Oral Given 11/18/23 1516)  pregabalin (LYRICA) capsule 150 mg (has no administration in time range)  azithromycin (ZITHROMAX) 500 mg in sodium chloride 0.9 % 250 mL IVPB (500 mg Intravenous New Bag/Given 11/18/23 1546)  magnesium sulfate IVPB 2 g 50 mL (0 g Intravenous Stopped 11/18/23 1150)  methylPREDNISolone sodium succinate (SOLU-MEDROL) 125 mg/2 mL injection 125 mg (125 mg Intravenous Given 11/18/23 1041)  albuterol (PROVENTIL) (2.5 MG/3ML) 0.083% nebulizer solution 2.5 mg (2.5 mg Nebulization Given 11/18/23 1044)  albuterol (PROVENTIL) (2.5 MG/3ML) 0.083% nebulizer  solution 2.5 mg (2.5 mg Nebulization Given 11/18/23 1043)  pregabalin (LYRICA) capsule 150 mg (150 mg Oral Given 11/18/23 1311)     IMPRESSION / MDM / ASSESSMENT AND PLAN / ED COURSE  I reviewed the triage vital signs and the nursing notes.  63 year old female with PMH as noted above presents with acute onset of shortness of breath this morning, found to be significantly hypoxic and in respiratory distress by EMS.  CPAP was started while en route.  On arrival to the ED the patient remains in respiratory distress.  Breath sounds are diminished bilaterally with scattered wheezing but no rales.  Differential diagnosis includes, but is not limited to, COPD exacerbation, acute bronchitis, pneumonia, acute pulmonary edema, new onset CHF.  Overall presentation favors COPD.  We switched the patient over to BiPAP with immediate improvement.  We will continue treatment with BiPAP, bronchodilators, and I have ordered magnesium and Solu-Medrol.  Will obtain chest x-ray, lab workup, and reassess.  Patient's presentation is most consistent with acute presentation with potential threat to life or bodily function.  The patient is on the cardiac monitor to evaluate for evidence of arrhythmia and/or significant heart rate changes.  ----------------------------------------- 12:14 PM on 11/18/2023 -----------------------------------------  X-ray shows improvement from prior with no evidence of edema.  BNP is slightly elevated but improved from prior.  CBC shows mild leukocytosis.  Respiratory panel is negative.    On reassessment the patient appears much more comfortable on BiPAP and her work of breathing is markedly decreased.  She will need admission for further management.  I consulted Dr. Lucianne Muss from the hospitalist service; based on our discussion he agrees to evaluate the patient for admission.   FINAL CLINICAL IMPRESSION(S) / ED DIAGNOSES   Final diagnoses:  Acute respiratory failure with hypoxia (HCC)   COPD exacerbation (HCC)     Rx / DC Orders   ED Discharge Orders     None        Note:  This document was prepared using Dragon voice recognition software and may include unintentional dictation errors.    Dionne Bucy, MD 11/18/23 [phone]

## 2023-11-18 NOTE — ED Notes (Signed)
Patient ambulated to bedside toilet without Bi-pap. Oxygen maintaining at 98% at room air.

## 2023-11-18 NOTE — H&P (Signed)
[facility] History and Physical   Patient: Cindy Gutierrez ZOX:[id number]   PCP: Patient, No Pcp Per DOB: Feb 23, 1961   DOA: 11/18/2023   DOS: 11/18/2023   DOS: the patient was seen and examined on 11/18/2023  Patient coming from: The patient is coming from Home  Chief Complaint: Shortness of breath  HPI: Cindy Gutierrez is a 63 y.o. female with Past medical history of COPD, HTN, CAD, neuropathy, cervical DJD, hypothyroidism, pre-DM, hepatitis C treated in the past, depression, recently admitted in January with respiratory failure, as reviewed from EMR, presented with sudden onset of shortness of breath in the morning.  Patient says that she was sleeping and she woke up in the morning and soon after she started having shortness of breath.  She tried nebulizer treatment which did not help.  EMS was called and patient was found to have O2 saturation 68%, she was placed on CPAP and brought into the ED. Patient denies any chest pain or palpitations, no abdominal pain, denies any other complaints.  No fever or chills   ED Course: VS afebrile, pulse 92, RR 21, BP 174/69, patient was placed on BiPAP in the ED ABG 7.24/64/62/20 7.4/90% BMP: Glucose 189, calcium 8.5, phosphorus 4.9, rest within normal range. BNP 660 elevated Troponin 19-37, most likely demand ischemia Lactic acid 1.3, TSH 1.2 within normal range.  Negative COVID flu and RSV WBC 14.6 elevated D-dimer 0.98 slightly elevated Respiratory viral panel pending CXR: Interval improvement in the alveolar opacities overlying the right upper mid lung zones, as described above.    Review of Systems: as mentioned in the history of present illness.  All other systems reviewed and are negative.  Past Medical History:  Diagnosis Date   Aortic valve regurgitation    COPD (chronic obstructive pulmonary disease) (HCC)    Depression    Hepatitis C    Hyperparathyroidism (HCC)    Hypertension    Mitral valve regurgitation    Non-toxic  multinodular goiter    Pulmonary hypertension (HCC) 2022   noted on CT   Pulmonary nodule    Thyroid disease    Past Surgical History:  Procedure Laterality Date   COLONOSCOPY WITH PROPOFOL N/A 08/21/2018   Procedure: COLONOSCOPY WITH PROPOFOL;  Surgeon: Toney Reil, MD;  Location: [facility];  Service: Gastroenterology;  Laterality: N/A;   PARATHYROIDECTOMY Left    TUBAL LIGATION     Social History:  reports that she has never smoked. She has never used smokeless tobacco. She reports that she does not drink alcohol and does not use drugs.  Allergies  Allergen Reactions   Sulfa Antibiotics Hives and Rash   Bee Pollen Hives   Lisinopril Nausea Only   Losartan    Penicillins Hives   Penicillins    Sulfa Antibiotics    Tiotropium Bromide Monohydrate     Other reaction(s): Cough   Carvedilol      Family history reviewed and not pertinent Family History  Problem Relation Age of Onset   Alzheimer's disease Mother    Heart disease Father      Prior to Admission medications   Medication Sig Start Date End Date Taking? Authorizing Provider  acetaminophen (TYLENOL) 325 MG tablet Take 650 mg by mouth 3 (three) times daily as needed for mild pain (pain score 1-3).    [provider]  albuterol (PROVENTIL HFA;VENTOLIN HFA) 108 (90 Base) MCG/ACT inhaler Inhale 1-2 puffs into the lungs every 6 (six) hours as needed for wheezing or  shortness of breath. 06/26/16   Hagler, Jami L, PA-C  albuterol (PROVENTIL) (2.5 MG/3ML) 0.083% nebulizer solution Take 2.5 mg by nebulization every 6 (six) hours as needed for wheezing or shortness of breath.    [provider]  albuterol (PROVENTIL) (2.5 MG/3ML) 0.083% nebulizer solution Take 2.5 mg by nebulization every 4 (four) hours as needed for wheezing or shortness of breath.    [provider]  albuterol (VENTOLIN HFA) 108 (90 Base) MCG/ACT inhaler Inhale 2 puffs into the lungs every 6 (six) hours as needed for  wheezing or shortness of breath. 10/19/23   Wouk, Wilfred Curtis, MD  amLODipine (NORVASC) 10 MG tablet Take 10 mg by mouth daily. Patient not taking: Reported on 09/19/2023    [provider]  amLODipine (NORVASC) 10 MG tablet Take 1 tablet (10 mg total) by mouth daily. 10/20/23   Wouk, Wilfred Curtis, MD  aspirin 81 MG chewable tablet Chew 81 mg by mouth daily. 04/13/22   [provider]  aspirin 81 MG chewable tablet Chew 81 mg by mouth daily.    [provider]  budesonide-formoterol (SYMBICORT) 160-4.5 MCG/ACT inhaler Inhale 2 puffs into the lungs 2 (two) times daily.    [provider]  budesonide-formoterol (SYMBICORT) 160-4.5 MCG/ACT inhaler Inhale 2 puffs into the lungs 2 (two) times daily.    [provider]  calcium carbonate (TUMS - DOSED IN MG ELEMENTAL CALCIUM) 500 MG chewable tablet Chew 1 tablet by mouth daily. As needed    [provider]  calcium carbonate (TUMS - DOSED IN MG ELEMENTAL CALCIUM) 500 MG chewable tablet Chew 1 tablet by mouth daily.    [provider]  calcium citrate (CALCITRATE - DOSED IN MG ELEMENTAL CALCIUM) 950 (200 Ca) MG tablet Take 200 mg of elemental calcium by mouth daily.    [provider]  celecoxib (CELEBREX) 100 MG capsule Take 100 mg by mouth 2 (two) times daily.    [provider]  celecoxib (CELEBREX) 200 MG capsule Take 200 mg by mouth 2 (two) times daily.    [provider]  chlorthalidone (HYGROTON) 25 MG tablet Take 12.5 mg by mouth daily.    [provider]  chlorthalidone (HYGROTON) 25 MG tablet Take 0.5 tablets (12.5 mg total) by mouth daily. 10/20/23   Wouk, Wilfred Curtis, MD  cholecalciferol (VITAMIN D3) 25 MCG (1000 UNIT) tablet Take 2,000 Units by mouth daily.    [provider]  Cholecalciferol 25 MCG (1000 UT) tablet Take 3,000 Units by mouth daily. 06/22/22   [provider]  diclofenac Sodium (VOLTAREN) 1 % GEL Apply 2 g topically  4 (four) times daily. 09/21/22   [provider]  escitalopram (LEXAPRO) 10 MG tablet Take 10 mg by mouth daily.    [provider]  famotidine (PEPCID) 20 MG tablet Take 20 mg by mouth 2 (two) times daily.    [provider]  furosemide (LASIX) 20 MG tablet Take 20 mg by mouth daily. 08/01/23   [provider]  Glecaprevir-Pibrentasvir (MAVYRET) 100-40 MG TABS Take 3 tablets by mouth daily.    [provider]  guaiFENesin (ROBITUSSIN) 100 MG/5ML liquid Take 5 mLs by mouth every 4 (four) hours as needed for cough or to loosen phlegm.    [provider]  pregabalin (LYRICA) 150 MG capsule Take 150 mg by mouth 2 (two) times daily.    [provider]  pregabalin (LYRICA) 150 MG capsule Take 150 mg by mouth 2 (two) times  daily.    [provider]  rosuvastatin (CRESTOR) 20 MG tablet Take 20 mg by mouth daily.    [provider]  Tiotropium Bromide Monohydrate (SPIRIVA RESPIMAT) 2.5 MCG/ACT AERS Inhale 2 puffs into the lungs daily.    [provider]    Physical Exam: Vitals:   11/18/23 1043 11/18/23 1045 11/18/23 1048 11/18/23 1100  BP:    (!) 165/65  Pulse: 85 82  79  Resp: 20 20  (!) 21  Temp:   98.5 F (36.9 C)   TempSrc:   Oral   SpO2: 100% 100%  100%  Weight:      Height:        General: alert and oriented to time, place, and person. Appear in moderate distress, affect appropriate Eyes: PERRLA, Conjunctiva normal ENT: Oral Mucosa Clear, moist  Neck: no JVD, no Abnormal Mass Or lumps Cardiovascular: S1 and S2 Present, no Murmur, peripheral pulses symmetrical Respiratory: increased respiratory effort, Bilateral Air entry equal and Decreased, signs of accessory muscle use, mild basal Crackles, mild wheezes Abdomen: Bowel Sound present, Soft and no tenderness, no hernia Skin: no rashes  Extremities: 1-2+ Pedal edema, no calf tenderness Neurologic: without any new focal findings Gait not checked  due to patient safety concerns  Data Reviewed: I have personally reviewed and interpreted labs, imaging as discussed below.  CBC: Recent Labs  Lab 11/18/23 1033  WBC 14.6*  NEUTROABS 12.9*  HGB 11.3*  HCT 36.2  MCV 88.1  PLT 268   Basic Metabolic Panel: Recent Labs  Lab 11/18/23 1033  NA 143  K 3.7  CL 109  CO2 25  GLUCOSE 189*  BUN 14  CREATININE 0.72  CALCIUM 8.5*   GFR: Estimated Creatinine Clearance: 80.7 mL/min (by C-G formula based on SCr of 0.72 mg/dL). Liver Function Tests: No results for input(s): "AST", "ALT", "ALKPHOS", "BILITOT", "PROT", "ALBUMIN" in the last 168 hours. No results for input(s): "LIPASE", "AMYLASE" in the last 168 hours. No results for input(s): "AMMONIA" in the last 168 hours. Coagulation Profile: No results for input(s): "INR", "PROTIME" in the last 168 hours. Cardiac Enzymes: No results for input(s): "CKTOTAL", "CKMB", "CKMBINDEX", "TROPONINI" in the last 168 hours. BNP (last 3 results) No results for input(s): "PROBNP" in the last 8760 hours. HbA1C: No results for input(s): "HGBA1C" in the last 72 hours. CBG: No results for input(s): "GLUCAP" in the last 168 hours. Lipid Profile: No results for input(s): "CHOL", "HDL", "LDLCALC", "TRIG", "CHOLHDL", "LDLDIRECT" in the last 72 hours. Thyroid Function Tests: No results for input(s): "TSH", "T4TOTAL", "FREET4", "T3FREE", "THYROIDAB" in the last 72 hours. Anemia Panel: No results for input(s): "VITAMINB12", "FOLATE", "FERRITIN", "TIBC", "IRON", "RETICCTPCT" in the last 72 hours. Urine analysis:    Component Value Date/Time   COLORURINE YELLOW (A) 09/24/2022 0138   APPEARANCEUR CLEAR (A) 09/24/2022 0138   LABSPEC 1.016 09/24/2022 0138   PHURINE 5.0 09/24/2022 0138   GLUCOSEU >=500 (A) 09/24/2022 0138   HGBUR NEGATIVE 09/24/2022 0138   BILIRUBINUR NEGATIVE 09/24/2022 0138   KETONESUR NEGATIVE 09/24/2022 0138   PROTEINUR NEGATIVE 09/24/2022 0138   NITRITE NEGATIVE 09/24/2022 0138    LEUKOCYTESUR NEGATIVE 09/24/2022 0138    Radiological Exams on Admission: DG Chest Port 1 View Result Date: 11/18/2023 CLINICAL DATA:  Shortness of breath. EXAM: PORTABLE CHEST 1 VIEW COMPARISON:  10/16/2023. FINDINGS: Redemonstration of small sub 5 mm heterogeneous alveolar opacities overlying the right upper mid lung zones, decreased in size and opacity when compared to the prior radiograph from  10/16/2023; which were new since the prior radiograph from 09/20/2023. Findings favor resolving infection/atypical pneumonia. Bilateral lung fields are otherwise clear. No acute consolidation or lung collapse. Bilateral costophrenic angles are clear. Normal cardio-mediastinal silhouette. No acute osseous abnormalities. The soft tissues are within normal limits. Redemonstration of surgical staples overlying the lower neck. IMPRESSION: Interval improvement in the alveolar opacities overlying the right upper mid lung zones, as described above. Electronically Signed   By: Jules Schick M.D.   On: 11/18/2023 11:13   EKG: Independently reviewed. normal EKG, normal sinus rhythm. Echocardiogram: LVEF 55 to 60%, grade 1 diastolic dysfunction, moderate MR.  I reviewed all nursing notes, pharmacy notes, vitals, pertinent old records.  Assessment/Plan Principal Problem:   Acute hypoxic respiratory failure (HCC)  # Acute hypoxic respiratory failure due to COPD exacerbation D-dimer slightly trending, less likely PE, CT angio was done last month during similar presentation and it was negative for PE, so avoiding excessive radiation exposure Patient is a frequent flyer due to respiratory failure and COPD exacerbation Continue BiPAP Continue supplemental O2 inhalation and gradually wean off S/p Solu-Medrol 125 mg IV one-time dose given Started Solu-Medrol 40 mg IV twice daily Started azithromycin 500 mg IV x 3 doses for prophylaxis Started Brovana and Pulmicort nebulizer treatment, DuoNeb every 6 hourly Started  pantoprazole for GI prophylaxis   # Chronic diastolic CHF TTE LVEF 55 to 60%, grade 1 diastolic Chane, moderate MR BNP elevated, 1-2+ lower extremity edema Started Lasix 40 mg IV twice daily Check venous duplex to rule out DVT Monitor renal functions  # HTN, CAD Continue aspirin 81 mg daily Continue amlodipine 10 mg p.o. daily, Crestor 20 mg p.o. 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  Opportunity was given to ask question and all questions were answered satisfactorily.  Disposition: Admitted as inpatient, telemetry unit. Likely to be discharged home, in 2-3 days when stable.  I have discussed plan of care as described above with RN and patient/family.  Severity of Illness: The appropriate patient status for this patient is INPATIENT. Inpatient status is judged to be reasonable and necessary in order to provide the required intensity of service to ensure the patient's safety. The patient's presenting symptoms, physical exam findings, and initial radiographic and laboratory data in the context of their chronic comorbidities is felt to place them at high risk for further clinical deterioration. Furthermore, it is not anticipated that the patient will be medically stable for discharge from the hospital within 2 midnights of admission.   * I certify that at the point of admission it is my clinical judgment that the patient will require inpatient hospital care spanning beyond 2 midnights from the point of admission due  to high intensity of service, high risk for further deterioration and high frequency of surveillance required.*   Author: Gillis Santa, MD [facility] 11/18/2023 12:56 PM   To reach On-call, see care teams to locate the attending and reach out to them via [email or web address]. If 7PM-7AM, please contact night-coverage If you still have difficulty reaching the attending provider, please  page the [facility] (Director on Call) for [facility] on amion for assistance.

## 2023-11-18 NOTE — ED Notes (Addendum)
RT removed Bi-pap.

## 2023-11-19 DIAGNOSIS — J9601 Acute respiratory failure with hypoxia: Secondary | ICD-10-CM

## 2023-11-19 LAB — PROTIME-INR
INR: 1 (ref 0.8–1.2)
Prothrombin Time: 13.1 s (ref 11.4–15.2)

## 2023-11-19 LAB — BASIC METABOLIC PANEL
Anion gap: 11 (ref 5–15)
BUN: 15 mg/dL (ref 8–23)
CO2: 25 mmol/L (ref 22–32)
Calcium: 8.4 mg/dL — ABNORMAL LOW (ref 8.9–10.3)
Chloride: 105 mmol/L (ref 98–111)
Creatinine, Ser: 0.75 mg/dL (ref 0.44–1.00)
GFR, Estimated: >60 mL/min (ref >60)
Glucose, Bld: 137 mg/dL — ABNORMAL HIGH (ref 70–99)
Potassium: 3.6 mmol/L (ref 3.5–5.1)
Sodium: 141 mmol/L (ref 135–145)

## 2023-11-19 LAB — CBC
HCT: 32.6 % — ABNORMAL LOW (ref 36.0–46.0)
Hemoglobin: 10.3 g/dL — ABNORMAL LOW (ref 12.0–15.0)
MCH: 27.3 pg (ref 26.0–34.0)
MCHC: 31.6 g/dL (ref 30.0–36.0)
MCV: 86.5 fL (ref 80.0–100.0)
Platelets: 221 10*3/uL (ref 150–400)
RBC: 3.77 MIL/uL — ABNORMAL LOW (ref 3.87–5.11)
RDW: 15.3 % (ref 11.5–15.5)
WBC: 5.7 10*3/uL (ref 4.0–10.5)
nRBC: 0 % (ref 0.0–0.2)

## 2023-11-19 LAB — CBG MONITORING, ED
Glucose-Capillary: 136 mg/dL — ABNORMAL HIGH (ref 70–99)
Glucose-Capillary: 134 mg/dL — ABNORMAL HIGH (ref 70–99)
Glucose-Capillary: 133 mg/dL — ABNORMAL HIGH (ref 70–99)
Glucose-Capillary: 115 mg/dL — ABNORMAL HIGH (ref 70–99)

## 2023-11-19 LAB — MAGNESIUM: Magnesium: 2.3 mg/dL (ref 1.7–2.4)

## 2023-11-19 LAB — PHOSPHORUS: Phosphorus: 2.2 mg/dL — ABNORMAL LOW (ref 2.5–4.6)

## 2023-11-19 MED ORDER — IPRATROPIUM-ALBUTEROL 0.5-2.5 (3) MG/3ML IN SOLN
3.0000 mL | Freq: Four times a day (QID) | RESPIRATORY_TRACT | Status: DC | PRN
Start: 2023-11-19 — End: 2023-11-20
  Administered 2023-11-19 (×2): 3 mL via RESPIRATORY_TRACT
  Filled 2023-11-19 (×2): qty 3

## 2023-11-19 MED ORDER — POTASSIUM CHLORIDE CRYS ER 20 MEQ PO TBCR
40.0000 meq | EXTENDED_RELEASE_TABLET | Freq: Once | ORAL | Status: DC
  Filled 2023-11-19: qty 2

## 2023-11-19 MED ORDER — POLYSACCHARIDE IRON COMPLEX 150 MG PO CAPS
150.0000 mg | ORAL_CAPSULE | Freq: Every day | ORAL | Status: DC
  Administered 2023-11-19 – 2023-11-20 (×2): 150 mg via ORAL
  Filled 2023-11-19 (×3): qty 1

## 2023-11-19 MED ORDER — K PHOS MONO-SOD PHOS DI & MONO 155-852-130 MG PO TABS
500.0000 mg | ORAL_TABLET | Freq: Once | ORAL | Status: AC
  Administered 2023-11-19: 500 mg via ORAL
  Filled 2023-11-19 (×2): qty 2

## 2023-11-19 MED ORDER — VITAMIN C 500 MG PO TABS
500.0000 mg | ORAL_TABLET | Freq: Every day | ORAL | Status: DC
  Administered 2023-11-19 – 2023-11-20 (×2): 500 mg via ORAL
  Filled 2023-11-19 (×2): qty 1

## 2023-11-19 MED ORDER — CYANOCOBALAMIN 500 MCG PO TABS: 1000.0000 ug | ORAL_TABLET | Freq: Every day | ORAL | Status: DC

## 2023-11-19 MED ORDER — VITAMIN D (ERGOCALCIFEROL) 1.25 MG (50000 UNIT) PO CAPS
50000.0000 [IU] | ORAL_CAPSULE | ORAL | Status: DC
  Administered 2023-11-19: 50000 [IU] via ORAL
  Filled 2023-11-19 (×2): qty 1

## 2023-11-19 MED ORDER — HYDRALAZINE HCL 20 MG/ML IJ SOLN: 10.0000 mg | Freq: Four times a day (QID) | INTRAMUSCULAR | Status: DC | PRN

## 2023-11-19 MED ORDER — CYANOCOBALAMIN 1000 MCG/ML IJ SOLN
1000.0000 ug | Freq: Every day | INTRAMUSCULAR | Status: DC
  Administered 2023-11-19 – 2023-11-20 (×2): 1000 ug via INTRAMUSCULAR
  Filled 2023-11-19 (×3): qty 1

## 2023-11-19 MED ORDER — HYDRALAZINE HCL 50 MG PO TABS: 50.0000 mg | ORAL_TABLET | Freq: Four times a day (QID) | ORAL | Status: DC | PRN

## 2023-11-19 NOTE — Progress Notes (Signed)
[facility] Progress Note  Patient: Cindy Gutierrez    UJW:[id number]  DOA: 11/18/2023     Date of Service: the patient was seen and examined on 11/19/2023  Chief Complaint  Patient presents with   Respiratory Distress   Brief hospital course: SOLAE NORLING is a 63 y.o. female with Past medical history of COPD, HTN, CAD, neuropathy, cervical DJD, hypothyroidism, pre-DM, hepatitis C treated in the past, depression, recently admitted in January with respiratory failure, as reviewed from EMR, presented with sudden onset of shortness of breath in the morning.  Patient says that she was sleeping and she woke up in the morning and soon after she started having shortness of breath.  She tried nebulizer treatment which did not help.  EMS was called and patient was found to have O2 saturation 68%, she was placed on CPAP and brought into the ED. Patient denies any chest pain or palpitations, no abdominal pain, denies any other complaints.  No fever or chills     ED Course: VS afebrile, pulse 92, RR 21, BP 174/69, patient was placed on BiPAP in the ED ABG 7.24/64/62/20 7.4/90% BMP: Glucose 189, calcium 8.5, phosphorus 4.9, rest within normal range. BNP 660 elevated Troponin 19-37, most likely demand ischemia Lactic acid 1.3, TSH 1.2 within normal range.  Negative COVID flu and RSV WBC 14.6 elevated D-dimer 0.98 slightly elevated Respiratory viral panel pending CXR: Interval improvement in the alveolar opacities overlying the right upper mid lung zones, as described above.     Assessment and Plan: Principal Problem:   Acute hypoxic respiratory failure (HCC)   # Acute hypoxic respiratory failure due to COPD exacerbation D-dimer slightly trending, less likely PE, CT angio was done last month during similar presentation and it was negative for PE, so avoiding excessive radiation exposure Patient is a frequent flyer due to respiratory failure and COPD exacerbation Continue BiPAP Continue  supplemental O2 inhalation and gradually wean off S/p Solu-Medrol 125 mg IV one-time dose given Started Solu-Medrol 40 mg IV twice daily Started azithromycin 500 mg IV x 3 doses for prophylaxis Started Brovana and Pulmicort nebulizer treatment, DuoNeb every 6 hourly Started pantoprazole for GI prophylaxis     # Chronic diastolic CHF TTE LVEF 55 to 60%, grade 1 diastolic Chane, moderate MR BNP elevated, 1-2+ lower extremity edema Continue Lasix 40 mg IV twice daily venous duplex negative for DVT Monitor renal functions   # Hypophosphatemia, Phos repleted. Monitor electrolytes.  # HTN, CAD Continue aspirin 81 mg daily Continue amlodipine 10 mg p.o. daily, Crestor 20 mg p.o. 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  Interventions:  Diet: Carb modified and heart healthy diet DVT Prophylaxis: Subcutaneous Lovenox   Advance goals of care discussion: Full code  Family Communication: family was present at bedside, at the time of interview.  The pt provided permission to discuss medical plan with the family. Opportunity was given to ask question and all questions were answered satisfactorily.   Disposition:  Pt is from Home, admitted with COPD resp failure, still has DOE, which precludes a safe discharge. Discharge to home, when stable, most likely discharge home tomorrow a.m.  Subjective: No significant events overnight, patient's breathing is improving, but  is still has significant dyspnea on exertion.  Patient was very short of breath while walking to the bathroom.  Denied any chest pain or palpitation, no any other complaints.  Physical Exam: General: NAD, lying comfortably Appear in no distress,  affect appropriate Eyes: PERRLA ENT: Oral Mucosa Clear, moist  Neck: no JVD,  Cardiovascular: S1 and S2 Present, no Murmur,  Respiratory: Equal air entry bilaterally, occasional wheezes, mild crackles. Abdomen: Bowel Sound present, Soft and no tenderness,  Skin: no rashes Extremities: no Pedal edema, no calf tenderness Neurologic: without any new focal findings Gait not checked due to patient safety concerns  Vitals:   11/19/23 1124 11/19/23 1200 11/19/23 1300 11/19/23 1400  BP:  (!) 156/58 (!) 143/48 (!) 158/53  Pulse:  66 72 75  Resp:  20 20 (!) 22  Temp: 98.7 F (37.1 C)     TempSrc: Oral     SpO2:  97% 98% 96%  Weight:      Height:       No intake or output data in the 24 hours ending 11/19/23 1507 Filed Weights   11/18/23 1026  Weight: 81.6 kg    Data Reviewed: I have personally reviewed and interpreted daily labs, tele strips, imagings as discussed above. I reviewed all nursing notes, pharmacy notes, vitals, pertinent old records I have discussed plan of care as described above with RN and patient/family.  CBC: Recent Labs  Lab 11/18/23 1033 11/19/23 0500  WBC 14.6* 5.7  NEUTROABS 12.9*  --   HGB 11.3* 10.3*  HCT 36.2 32.6*  MCV 88.1 86.5  PLT 268 221   Basic Metabolic Panel: Recent Labs  Lab 11/18/23 1033 11/19/23 0500  NA 143 141  K 3.7 3.6  CL 109 105  CO2 25 25  GLUCOSE 189* 137*  BUN 14 15  CREATININE 0.72 0.75  CALCIUM 8.5* 8.4*  MG 1.8 2.3  PHOS 4.9* 2.2*    Studies: No results found.  Scheduled Meds:  amLODipine  10 mg Oral Daily   arformoterol  15 mcg Nebulization BID   vitamin C  500 mg Oral Daily   aspirin  81 mg Oral Daily   budesonide (PULMICORT) nebulizer solution  0.25 mg Nebulization BID   calcium carbonate  1 tablet Oral Daily   celecoxib  200 mg Oral BID   cyanocobalamin  1,000 mcg Intramuscular Daily   Followed by   Melene Muller ON 11/22/2023] vitamin B-12  1,000 mcg Oral Daily   enoxaparin (LOVENOX) injection  40 mg  Subcutaneous Q24H   escitalopram  10 mg Oral Daily   furosemide  40 mg Intravenous Q12H   insulin aspart  0-15 Units Subcutaneous TID WC   iron polysaccharides  150 mg Oral Daily   methylPREDNISolone (SOLU-MEDROL) injection  40 mg Intravenous Q12H   pantoprazole  40 mg Oral Daily   pregabalin  150 mg Oral BID   rosuvastatin  20 mg Oral Daily   sodium chloride flush  3 mL Intravenous Q12H   Vitamin D (Ergocalciferol)  50,000 Units Oral Q7 days   Continuous Infusions:  azithromycin Stopped (11/18/23 1646)   PRN Meds: acetaminophen **OR** acetaminophen, bisacodyl, ipratropium-albuterol, methocarbamol (ROBAXIN) injection, polyethylene glycol, sodium chloride flush  Time spent: 35 minutes  Author: Gillis Santa. MD [facility] 11/19/2023 3:07 PM  To reach On-call, see care teams to locate the attending and reach out to them via [email or web address]. If 7PM-7AM, please contact night-coverage If you still have difficulty reaching the attending provider, please page the  DOC (Director on Call) for [facility] on amion for assistance.

## 2023-11-19 NOTE — ED Notes (Signed)
Pt ambulated to restroom by herself. Pt became very short of breath and told this RN she needed some oxygen. 2L on [place] applied to pt.

## 2023-11-20 ENCOUNTER — Other Ambulatory Visit: Payer: Self-pay

## 2023-11-20 DIAGNOSIS — J9601 Acute respiratory failure with hypoxia: Secondary | ICD-10-CM | POA: Diagnosis not present

## 2023-11-20 LAB — BASIC METABOLIC PANEL
Anion gap: 11 (ref 5–15)
BUN: 23 mg/dL (ref 8–23)
CO2: 29 mmol/L (ref 22–32)
Calcium: 8.1 mg/dL — ABNORMAL LOW (ref 8.9–10.3)
Chloride: 101 mmol/L (ref 98–111)
Creatinine, Ser: 0.96 mg/dL (ref 0.44–1.00)
GFR, Estimated: >60 mL/min (ref >60)
Glucose, Bld: 210 mg/dL — ABNORMAL HIGH (ref 70–99)
Potassium: 3.4 mmol/L — ABNORMAL LOW (ref 3.5–5.1)
Sodium: 141 mmol/L (ref 135–145)

## 2023-11-20 LAB — CBG MONITORING, ED
Glucose-Capillary: 175 mg/dL — ABNORMAL HIGH (ref 70–99)
Glucose-Capillary: 75 mg/dL (ref 70–99)

## 2023-11-20 LAB — PHOSPHORUS: Phosphorus: 4.3 mg/dL (ref 2.5–4.6)

## 2023-11-20 LAB — CBC
HCT: 33.6 % — ABNORMAL LOW (ref 36.0–46.0)
Hemoglobin: 10.7 g/dL — ABNORMAL LOW (ref 12.0–15.0)
MCH: 27.2 pg (ref 26.0–34.0)
MCHC: 31.8 g/dL (ref 30.0–36.0)
MCV: 85.3 fL (ref 80.0–100.0)
Platelets: 284 10*3/uL (ref 150–400)
RBC: 3.94 MIL/uL (ref 3.87–5.11)
RDW: 15.4 % (ref 11.5–15.5)
WBC: 10.7 10*3/uL — ABNORMAL HIGH (ref 4.0–10.5)
nRBC: 0 % (ref 0.0–0.2)

## 2023-11-20 LAB — MAGNESIUM: Magnesium: 2.1 mg/dL (ref 1.7–2.4)

## 2023-11-20 MED ORDER — LORATADINE 10 MG PO TABS
10.0000 mg | ORAL_TABLET | Freq: Every day | ORAL | 2 refills | Status: DC | PRN
  Filled 2023-11-20: qty 30, 30d supply, fill #0

## 2023-11-20 MED ORDER — VITAMIN D (ERGOCALCIFEROL) 1.25 MG (50000 UNIT) PO CAPS
50000.0000 [IU] | ORAL_CAPSULE | ORAL | 2 refills | Status: AC
  Filled 2023-11-20: qty 4, 28d supply, fill #0

## 2023-11-20 MED ORDER — CYANOCOBALAMIN 1000 MCG PO TABS
1000.0000 ug | ORAL_TABLET | Freq: Every day | ORAL | 2 refills | Status: AC
  Filled 2023-11-20: qty 30, 30d supply, fill #0

## 2023-11-20 MED ORDER — PREDNISONE 20 MG PO TABS
ORAL_TABLET | ORAL | 0 refills | Status: AC
  Filled 2023-11-20: qty 5, 4d supply, fill #0

## 2023-11-20 MED ORDER — ASCORBIC ACID 500 MG PO TABS
500.0000 mg | ORAL_TABLET | Freq: Every day | ORAL | 2 refills | Status: AC
  Filled 2023-11-20: qty 30, 30d supply, fill #0

## 2023-11-20 MED ORDER — POLYSACCHARIDE IRON COMPLEX 150 MG PO CAPS
150.0000 mg | ORAL_CAPSULE | Freq: Every day | ORAL | 2 refills | Status: DC
  Filled 2023-11-20: qty 30, 30d supply, fill #0

## 2023-11-20 MED ORDER — DEXTROMETHORPHAN-GUAIFENESIN 20-200 MG/20ML PO LIQD
20.0000 mL | Freq: Three times a day (TID) | ORAL | 0 refills | Status: DC | PRN
  Filled 2023-11-20: qty 118, 2d supply, fill #0

## 2023-11-20 MED ORDER — AZITHROMYCIN 500 MG PO TABS
500.0000 mg | ORAL_TABLET | Freq: Once | ORAL | Status: AC
  Administered 2023-11-20: 500 mg via ORAL
  Filled 2023-11-20: qty 1

## 2023-11-20 NOTE — ED Notes (Signed)
Personal Hygiene items provided to patient at this time.

## 2023-11-20 NOTE — ED Notes (Signed)
Pt ambulated to bathroom and around nursing station on pulse ox. Pt becomes very winded w/ tachypnea and sats have dipped as low as 89% briefly but will increase from 93-97% with breaks and focused breathing.

## 2023-11-20 NOTE — ED Notes (Addendum)
Pt's O2 sats were 98% on 2L at rest. This tech assisted pt to ambulate on 2L. The patient was averaging between 96-98% at first, towards the end pt experienced tachypnea and her O2 sats dropped down to 93% on the 2L. When this tech asked pt how they felt after their walk, pt stated "I felt short of breath and tired".

## 2023-11-20 NOTE — ED Notes (Addendum)
Pt's O2 saturation on room air at rest: 97 Pt's O2 saturation on room air while ambulating:89%-93% Pt's O2 saturation on 2L at rest: 98% Pt's O2 saturation on 2L while ambulation: 93%

## 2023-11-20 NOTE — Discharge Summary (Signed)
[facility] Discharge Summary   Patient: Cindy Gutierrez WUJ:[id number]  PCP: Patient, No Pcp Per  Date of admission: 11/18/2023   Date of discharge:  11/20/2023     Discharge Diagnoses:  Principal Problem:   Acute hypoxic respiratory failure (HCC)   Admitted From: Home Disposition:  Home   Recommendations for Outpatient Follow-up:  F/u with PCP in 1 wk F/u with Pulmo in 1 wk if remain Hypoxic  F/u Cards in 1-2 wks if lower extremity edema relapse Follow up LABS/TEST:     Follow-up Information     PCP Follow up in 1 week(s).                 Diet recommendation: Cardiac diet  Activity: The patient is advised to gradually reintroduce usual activities, as tolerated  Discharge Condition: stable  Code Status: Full code   History of present illness: As per the H and P dictated on admission Hospital Course:  Cindy Gutierrez is a 63 y.o. female with Past medical history of COPD, HTN, CAD, neuropathy, cervical DJD, hypothyroidism, pre-DM, hepatitis C treated in the past, depression, recently admitted in January with respiratory failure, as reviewed from EMR, presented with sudden onset of shortness of breath in the morning.  Patient says that she was sleeping and she woke up in the morning and soon after she started having shortness of breath.  She tried nebulizer treatment which did not help.  EMS was called and patient was found to have O2 saturation 68%, she was placed on CPAP and brought into the ED. Patient denies any chest pain or palpitations, no abdominal pain, denies any other complaints.  No fever or chills     ED Course: VS afebrile, pulse 92, RR 21, BP 174/69, patient was placed on BiPAP in the ED ABG 7.24/64/62/20 7.4/90% BMP: Glucose 189, calcium 8.5, phosphorus 4.9, rest within normal range. BNP 660 elevated Troponin 19-37, most likely demand ischemia Lactic acid 1.3, TSH 1.2 within normal range.  Negative COVID flu and RSV WBC 14.6 elevated D-dimer 0.98  slightly elevated Respiratory viral panel pending CXR: Interval improvement in the alveolar opacities overlying the right upper mid lung zones, as described above.    Assessment and Plan: Principal Problem:   Acute hypoxic respiratory failure (HCC)   # Acute hypoxic respiratory failure due to COPD exacerbation D-dimer slightly trending, less likely PE, CT angio was done last month during similar presentation and it was negative for PE, so avoiding excessive radiation exposure. Patient is a frequent flyer due to respiratory failure and COPD exacerbation. S/p BiPAP prn. S/p supplemental O2 inhalation and gradually weaned off. /p Solu-Medrol 125 mg IV one-time dose given, s/p Solu-Medrol 40 mg IV twice daily. S/p azithromycin 500 mg IV x 3 doses for prophylaxis. S/p Brovana and Pulmicort nebulizer treatment, DuoNeb every 6 hourly. Started pantoprazole for GI prophylaxis Patient's breathing gradually improved and oxygen was weaned off.  Patient was ambulated hallway but she did not qualify for home oxygen as per RN. Patient was discharged on prednisone tapering dose.  Albuterol inhaler/nebulizer as needed, continued alert.  # Chronic diastolic CHF TTE LVEF 55 to 60%, grade 1 diastolic Chane, moderate MR BNP elevated, 1-2+ lower extremity edema. s/p Continue Lasix 40 mg IV twice daily. venous duplex negative for DVT.  Resumed furosemide 20 mg p.o. daily # Hypophosphatemia, Phos repleted. # HTN, CAD, Continue aspirin 81 mg daily Continue amlodipine 10 mg p.o. daily, Crestor 20 mg p.o. daily # Cervical neurology and  peripheral neuropathy of right arm Continued home med Lyrica and Celebrex # Depression, continue Lexapro # Prediabetic, mild hyperglycemia, patient is not on any medication at home Continue diabetic diet Started NovoLog sliding scale, monitor CBG     Body mass index is 27.37 kg/m.  Nutrition Interventions:  - Patient was instructed, not to drive, operate heavy machinery,  perform activities at heights, swimming or participation in water activities or provide baby sitting services while on Pain, Sleep and Anxiety Medications; until her outpatient Physician has advised to do so again.  - Also recommended to not to take more than prescribed Pain, Sleep and Anxiety Medications.  Patient was ambulatory without any assistance. On the day of the discharge the patient's vitals were stable, and no other acute medical condition were reported by patient. the patient was felt safe to be discharge at Home.  Consultants: Nephrology Procedures: None  Discharge Exam: General: Appear in no distress, no Rash; Oral Mucosa Clear, moist. Cardiovascular: S1 and S2 Present, no Murmur, Respiratory: normal respiratory effort, Bilateral Air entry present and no Crackles, no wheezes Abdomen: Bowel Sound present, Soft and no tenderness, no hernia Extremities: no Pedal edema, no calf tenderness Neurology: alert and oriented to time, place, and person affect appropriate.  Filed Weights   11/18/23 1026  Weight: 81.6 kg   Vitals:   11/20/23 1330 11/20/23 1400  BP: (!) 170/71 (!) 160/65  Pulse: 68 63  Resp:  (!) 24  Temp:    SpO2: 99% 96%    DISCHARGE MEDICATION: Allergies as of 11/20/2023       Reactions   Sulfa Antibiotics Hives, Rash   Bee Pollen Hives   Lisinopril Nausea Only   Losartan    Penicillins Hives   Penicillins    Sulfa Antibiotics    Tiotropium Bromide Monohydrate    Other reaction(s): Cough   Carvedilol         Medication List     STOP taking these medications    cholecalciferol 25 MCG (1000 UNIT) tablet Commonly known as: VITAMIN D3   Cholecalciferol 25 MCG (1000 UT) tablet   guaiFENesin 100 MG/5ML liquid Commonly known as: ROBITUSSIN       TAKE these medications    acetaminophen 325 MG tablet Commonly known as: TYLENOL Take 650 mg by mouth 3 (three) times daily as needed for mild pain (pain score 1-3).   albuterol (2.5 MG/3ML)  0.083% nebulizer solution Commonly known as: PROVENTIL Take 2.5 mg by nebulization every 4 (four) hours as needed for wheezing or shortness of breath. What changed: Another medication with the same name was removed. Continue taking this medication, and follow the directions you see here.   albuterol 108 (90 Base) MCG/ACT inhaler Commonly known as: VENTOLIN HFA Inhale 2 puffs into the lungs every 6 (six) hours as needed for wheezing or shortness of breath. What changed: Another medication with the same name was removed. Continue taking this medication, and follow the directions you see here.   amLODipine 10 MG tablet Commonly known as: NORVASC Take 1 tablet (10 mg total) by mouth daily. What changed: Another medication with the same name was removed. Continue taking this medication, and follow the directions you see here.   ascorbic acid 500 MG tablet Commonly known as: VITAMIN C Take 1 tablet (500 mg total) by mouth daily. Start taking on: November 21, 2023   aspirin 81 MG chewable tablet Chew 81 mg by mouth daily. What changed: Another medication with the same name was removed.  Continue taking this medication, and follow the directions you see here.   B-12 1000 MCG Tabs Take 1 tablet (1,000 mcg total) by mouth daily. Start taking on: November 22, 2023   budesonide-formoterol 160-4.5 MCG/ACT inhaler Commonly known as: SYMBICORT Inhale 2 puffs into the lungs 2 (two) times daily. What changed: Another medication with the same name was removed. Continue taking this medication, and follow the directions you see here.   calcium carbonate 500 MG chewable tablet Commonly known as: TUMS - dosed in mg elemental calcium Chew 1 tablet by mouth daily. As needed What changed: Another medication with the same name was removed. Continue taking this medication, and follow the directions you see here.   calcium citrate 950 (200 Ca) MG tablet Commonly known as: CALCITRATE - dosed in mg elemental  calcium Take 200 mg of elemental calcium by mouth daily.   celecoxib 200 MG capsule Commonly known as: CELEBREX Take 200 mg by mouth 2 (two) times daily. What changed: Another medication with the same name was removed. Continue taking this medication, and follow the directions you see here.   chlorthalidone 25 MG tablet Commonly known as: HYGROTON Take 0.5 tablets (12.5 mg total) by mouth daily. What changed: Another medication with the same name was removed. Continue taking this medication, and follow the directions you see here.   diclofenac Sodium 1 % Gel Commonly known as: VOLTAREN Apply 2 g topically 4 (four) times daily.   escitalopram 10 MG tablet Commonly known as: LEXAPRO Take 10 mg by mouth daily.   famotidine 20 MG tablet Commonly known as: PEPCID Take 20 mg by mouth 2 (two) times daily.   Ferrex 150 150 MG capsule Generic drug: iron polysaccharides Take 1 capsule (150 mg total) by mouth daily. Start taking on: November 21, 2023   furosemide 20 MG tablet Commonly known as: LASIX Take 20 mg by mouth daily.   loratadine 10 MG tablet Commonly known as: Claritin Take 1 tablet (10 mg total) by mouth daily as needed for allergies or rhinitis.   Mavyret 100-40 MG Tabs Generic drug: Glecaprevir-Pibrentasvir Take 3 tablets by mouth daily.   predniSONE 20 MG tablet Commonly known as: DELTASONE Take 2 tablets (40 mg total) by mouth daily with breakfast for 1 day, THEN 1.5 tablets (30 mg total) daily with breakfast for 1 day, THEN 1 tablet (20 mg total) daily with breakfast for 1 day, THEN 0.5 tablets (10 mg total) daily with breakfast for 1 day. Start taking on: November 20, 2023   pregabalin 150 MG capsule Commonly known as: LYRICA Take 150 mg by mouth 2 (two) times daily. What changed: Another medication with the same name was removed. Continue taking this medication, and follow the directions you see here.   Robafen DM 20-200 MG/20ML Liqd Generic drug:  Dextromethorphan-guaiFENesin Take 20 mLs by mouth every 8 (eight) hours as needed.   rosuvastatin 20 MG tablet Commonly known as: CRESTOR Take 20 mg by mouth daily.   Spiriva Respimat 2.5 MCG/ACT Aers Generic drug: Tiotropium Bromide Monohydrate Inhale 2 puffs into the lungs daily.   Vitamin D (Ergocalciferol) 1.25 MG (50000 UNIT) Caps capsule Commonly known as: DRISDOL Take 1 capsule (50,000 Units total) by mouth every 7 (seven) days. Start taking on: November 26, 2023               Durable Medical Equipment  (From admission, onward)           Start     Ordered  11/20/23 1109  For home use only DME oxygen  Once       Question Answer Comment  Length of Need 6 Months   Mode or (Route) Nasal cannula   Liters per Minute 2   Oxygen delivery system Gas      11/20/23 1108           Allergies  Allergen Reactions   Sulfa Antibiotics Hives and Rash   Bee Pollen Hives   Lisinopril Nausea Only   Losartan    Penicillins Hives   Penicillins    Sulfa Antibiotics    Tiotropium Bromide Monohydrate     Other reaction(s): Cough   Carvedilol    Discharge Instructions     Call MD for:  difficulty breathing, headache or visual disturbances   Complete by: As directed    Call MD for:  extreme fatigue   Complete by: As directed    Call MD for:  persistant dizziness or light-headedness   Complete by: As directed    Call MD for:  persistant nausea and vomiting   Complete by: As directed    Call MD for:  severe uncontrolled pain   Complete by: As directed    Call MD for:  temperature >100.4   Complete by: As directed    Diet - low sodium heart healthy   Complete by: As directed    Discharge instructions   Complete by: As directed    F/u with PCP in 1 wk F/u with Pulmo in 1 wk if remain Hypoxic  F/u Cards in 1-2 wks if lower extremity edema relapse   Increase activity slowly   Complete by: As directed        The results of significant diagnostics from this  hospitalization (including imaging, microbiology, ancillary and laboratory) are listed below for reference.    Significant Diagnostic Studies: US Venous Img Lower Bilateral (DVT) Result Date: 11/18/2023 CLINICAL DATA:  Bilateral lower extremity edema. EXAM: BILATERAL LOWER EXTREMITY VENOUS DOPPLER ULTRASOUND TECHNIQUE: Gray-scale sonography with compression, as well as color and duplex ultrasound, were performed to evaluate the deep venous system(s) from the level of the common femoral vein through the popliteal and proximal calf veins. COMPARISON:  None Available. FINDINGS: VENOUS Normal compressibility of the common femoral, superficial femoral, and popliteal veins, as well as the visualized calf veins. Visualized portions of profunda femoral vein and great saphenous vein unremarkable. No filling defects to suggest DVT on grayscale or color Doppler imaging. Doppler waveforms show normal direction of venous flow, normal respiratory plasticity and response to augmentation. OTHER None. Limitations: none IMPRESSION: *No acute deep vein thrombosis noted in the bilateral lower extremity. Electronically Signed   By: Jules Schick M.D.   On: 11/18/2023 15:07   DG Chest Port 1 View Result Date: 11/18/2023 CLINICAL DATA:  Shortness of breath. EXAM: PORTABLE CHEST 1 VIEW COMPARISON:  10/16/2023. FINDINGS: Redemonstration of small sub 5 mm heterogeneous alveolar opacities overlying the right upper mid lung zones, decreased in size and opacity when compared to the prior radiograph from 10/16/2023; which were new since the prior radiograph from 09/20/2023. Findings favor resolving infection/atypical pneumonia. Bilateral lung fields are otherwise clear. No acute consolidation or lung collapse. Bilateral costophrenic angles are clear. Normal cardio-mediastinal silhouette. No acute osseous abnormalities. The soft tissues are within normal limits. Redemonstration of surgical staples overlying the lower neck. IMPRESSION:  Interval improvement in the alveolar opacities overlying the right upper mid lung zones, as described above. Electronically Signed   By: Rhea Belton  Ramiro Harvest M.D.   On: 11/18/2023 11:13    Microbiology: Recent Results (from the past 240 hours)  Culture, blood (routine x 2)     Status: None (Preliminary result)   Collection Time: 11/18/23 10:30 AM   Specimen: BLOOD  Result Value Ref Range Status   Specimen Description BLOOD RIGHT ANTECUBITAL  Final   Special Requests   Final    BOTTLES DRAWN AEROBIC AND ANAEROBIC Blood Culture adequate volume   Culture   Final    NO GROWTH 2 DAYS Performed at [facility], [address], [place], [place] [address]    Report Status PENDING  Incomplete  Culture, blood (routine x 2)     Status: None (Preliminary result)   Collection Time: 11/18/23 10:33 AM   Specimen: BLOOD  Result Value Ref Range Status   Specimen Description BLOOD BLOOD LEFT WRIST  Final   Special Requests   Final    BOTTLES DRAWN AEROBIC AND ANAEROBIC Blood Culture results may not be optimal due to an inadequate volume of blood received in culture bottles   Culture   Final    NO GROWTH 2 DAYS Performed at [facility], [address], [place], [place] [address]    Report Status PENDING  Incomplete  Resp panel by RT-PCR (RSV, Flu A&B, Covid) Anterior Nasal Swab     Status: None   Collection Time: 11/18/23 10:33 AM   Specimen: Anterior Nasal Swab  Result Value Ref Range Status   SARS Coronavirus 2 by RT PCR NEGATIVE NEGATIVE Final    Comment: (NOTE) SARS-CoV-2 target nucleic acids are NOT DETECTED.  The SARS-CoV-2 RNA is generally detectable in upper respiratory specimens during the acute phase of infection. The lowest concentration of SARS-CoV-2 viral copies this assay can detect is 138 copies/mL. A negative result does not preclude SARS-Cov-2 infection and should not be used as the sole basis for treatment or other patient management decisions. A negative  result may occur with  improper specimen collection/handling, submission of specimen other than nasopharyngeal swab, presence of viral mutation(s) within the areas targeted by this assay, and inadequate number of viral copies(<138 copies/mL). A negative result must be combined with clinical observations, patient history, and epidemiological information. The expected result is Negative.  Fact Sheet for Patients:  BloggerCourse.com  Fact Sheet for Healthcare Providers:  SeriousBroker.it  This test is no t yet approved or cleared by the [place] FDA and  has been authorized for detection and/or diagnosis of SARS-CoV-2 by FDA under an Emergency Use Authorization (EUA). This EUA will remain  in effect (meaning this test can be used) for the duration of the COVID-19 declaration under Section 564(b)(1) of the Act, 21 U.S.C.section 360bbb-3(b)(1), unless the authorization is terminated  or revoked sooner.       Influenza A by PCR NEGATIVE NEGATIVE Final   Influenza B by PCR NEGATIVE NEGATIVE Final    Comment: (NOTE) The Xpert Xpress SARS-CoV-2/FLU/RSV plus assay is intended as an aid in the diagnosis of influenza from Nasopharyngeal swab specimens and should not be used as a sole basis for treatment. Nasal washings and aspirates are unacceptable for Xpert Xpress SARS-CoV-2/FLU/RSV testing.  Fact Sheet for Patients: BloggerCourse.com  Fact Sheet for Healthcare Providers: SeriousBroker.it  This test is not yet approved or cleared by the [place] FDA and has been authorized for detection and/or diagnosis of SARS-CoV-2 by FDA under an Emergency Use Authorization (EUA). This EUA will remain in effect (meaning this test can be used)  for the duration of the COVID-19 declaration under Section 564(b)(1) of the Act, 21 U.S.C. section 360bbb-3(b)(1), unless the authorization is  terminated or revoked.     Resp Syncytial Virus by PCR NEGATIVE NEGATIVE Final    Comment: (NOTE) Fact Sheet for Patients: BloggerCourse.com  Fact Sheet for Healthcare Providers: SeriousBroker.it  This test is not yet approved or cleared by the [place] FDA and has been authorized for detection and/or diagnosis of SARS-CoV-2 by FDA under an Emergency Use Authorization (EUA). This EUA will remain in effect (meaning this test can be used) for the duration of the COVID-19 declaration under Section 564(b)(1) of the Act, 21 U.S.C. section 360bbb-3(b)(1), unless the authorization is terminated or revoked.  Performed at [facility], [address] [place], [place] [address]   Respiratory (~20 pathogens) panel by PCR     Status: None   Collection Time: 11/18/23 12:23 PM   Specimen: Nasopharyngeal Swab; Respiratory  Result Value Ref Range Status   Adenovirus NOT DETECTED NOT DETECTED Final   Coronavirus 229E NOT DETECTED NOT DETECTED Final    Comment: (NOTE) The Coronavirus on the Respiratory Panel, DOES NOT test for the novel  Coronavirus (2019 nCoV)    Coronavirus HKU1 NOT DETECTED NOT DETECTED Final   Coronavirus NL63 NOT DETECTED NOT DETECTED Final   Coronavirus OC43 NOT DETECTED NOT DETECTED Final   Metapneumovirus NOT DETECTED NOT DETECTED Final   Rhinovirus / Enterovirus NOT DETECTED NOT DETECTED Final   Influenza A NOT DETECTED NOT DETECTED Final   Influenza B NOT DETECTED NOT DETECTED Final   Parainfluenza Virus 1 NOT DETECTED NOT DETECTED Final   Parainfluenza Virus 2 NOT DETECTED NOT DETECTED Final   Parainfluenza Virus 3 NOT DETECTED NOT DETECTED Final   Parainfluenza Virus 4 NOT DETECTED NOT DETECTED Final   Respiratory Syncytial Virus NOT DETECTED NOT DETECTED Final   Bordetella pertussis NOT DETECTED NOT DETECTED Final   Bordetella Parapertussis NOT DETECTED NOT DETECTED Final   Chlamydophila  pneumoniae NOT DETECTED NOT DETECTED Final   Mycoplasma pneumoniae NOT DETECTED NOT DETECTED Final    Comment: Performed at [facility], [address], [place], [place] [address]     Labs: CBC: Recent Labs  Lab 11/18/23 1033 11/19/23 0500 11/20/23 0858  WBC 14.6* 5.7 10.7*  NEUTROABS 12.9*  --   --   HGB 11.3* 10.3* 10.7*  HCT 36.2 32.6* 33.6*  MCV 88.1 86.5 85.3  PLT 268 221 284   Basic Metabolic Panel: Recent Labs  Lab 11/18/23 1033 11/19/23 0500 11/20/23 0858  NA 143 141 141  K 3.7 3.6 3.4*  CL 109 105 101  CO2 25 25 29   GLUCOSE 189* 137* 210*  BUN 14 15 23   CREATININE 0.72 0.75 0.96  CALCIUM 8.5* 8.4* 8.1*  MG 1.8 2.3 2.1  PHOS 4.9* 2.2* 4.3   Liver Function Tests: No results for input(s): "AST", "ALT", "ALKPHOS", "BILITOT", "PROT", "ALBUMIN" in the last 168 hours. No results for input(s): "LIPASE", "AMYLASE" in the last 168 hours. No results for input(s): "AMMONIA" in the last 168 hours. Cardiac Enzymes: No results for input(s): "CKTOTAL", "CKMB", "CKMBINDEX", "TROPONINI" in the last 168 hours. BNP (last 3 results) Recent Labs    09/20/23 0837 10/16/23 1053 11/18/23 1033  BNP 460.4* 810.3* 660.6*   CBG: Recent Labs  Lab 11/19/23 0745 11/19/23 1309 11/19/23 1639 11/20/23 0731 11/20/23 1143  GLUCAP 134* 133* 115* 175* 75    Time spent: 35 minutes  Signed:  Phoebie Shad  Lucianne Muss  [facility]  ***11/20/2023 2:44 PM

## 2023-11-23 LAB — CULTURE, BLOOD (ROUTINE X 2)
Culture: NO GROWTH
Culture: NO GROWTH
Special Requests: ADEQUATE

## 2023-12-05 ENCOUNTER — Other Ambulatory Visit: Payer: Self-pay

## 2023-12-05 ENCOUNTER — Inpatient Hospital Stay
Admission: EM | Admit: 2023-12-05 | Discharge: 2023-12-08 | DRG: 190 | Disposition: A | Discharge status: Home Health | Attending: Internal Medicine | Admitting: Internal Medicine

## 2023-12-05 ENCOUNTER — Emergency Department

## 2023-12-05 DIAGNOSIS — Z79899 Other long term (current) drug therapy: Secondary | ICD-10-CM

## 2023-12-05 DIAGNOSIS — G629 Polyneuropathy, unspecified: Secondary | ICD-10-CM | POA: Diagnosis present

## 2023-12-05 DIAGNOSIS — Z7982 Long term (current) use of aspirin: Secondary | ICD-10-CM

## 2023-12-05 DIAGNOSIS — Z7951 Long term (current) use of inhaled steroids: Secondary | ICD-10-CM | POA: Diagnosis not present

## 2023-12-05 DIAGNOSIS — Z91030 Bee allergy status: Secondary | ICD-10-CM

## 2023-12-05 DIAGNOSIS — Z820 Family history of epilepsy and other diseases of the nervous system: Secondary | ICD-10-CM

## 2023-12-05 DIAGNOSIS — R0902 Hypoxemia: Secondary | ICD-10-CM

## 2023-12-05 DIAGNOSIS — Z888 Allergy status to other drugs, medicaments and biological substances status: Secondary | ICD-10-CM | POA: Diagnosis not present

## 2023-12-05 DIAGNOSIS — Z8249 Family history of ischemic heart disease and other diseases of the circulatory system: Secondary | ICD-10-CM

## 2023-12-05 DIAGNOSIS — R059 Cough, unspecified: Secondary | ICD-10-CM | POA: Insufficient documentation

## 2023-12-05 DIAGNOSIS — I2489 Other forms of acute ischemic heart disease: Secondary | ICD-10-CM | POA: Diagnosis present

## 2023-12-05 DIAGNOSIS — R051 Acute cough: Principal | ICD-10-CM

## 2023-12-05 DIAGNOSIS — F419 Anxiety disorder, unspecified: Secondary | ICD-10-CM | POA: Diagnosis present

## 2023-12-05 DIAGNOSIS — Z9981 Dependence on supplemental oxygen: Secondary | ICD-10-CM

## 2023-12-05 DIAGNOSIS — Z87891 Personal history of nicotine dependence: Secondary | ICD-10-CM | POA: Diagnosis not present

## 2023-12-05 DIAGNOSIS — I2510 Atherosclerotic heart disease of native coronary artery without angina pectoris: Secondary | ICD-10-CM | POA: Diagnosis present

## 2023-12-05 DIAGNOSIS — I10 Essential (primary) hypertension: Secondary | ICD-10-CM | POA: Diagnosis present

## 2023-12-05 DIAGNOSIS — F17200 Nicotine dependence, unspecified, uncomplicated: Secondary | ICD-10-CM | POA: Diagnosis present

## 2023-12-05 DIAGNOSIS — I272 Pulmonary hypertension, unspecified: Secondary | ICD-10-CM | POA: Diagnosis present

## 2023-12-05 DIAGNOSIS — J9601 Acute respiratory failure with hypoxia: Secondary | ICD-10-CM | POA: Diagnosis present

## 2023-12-05 DIAGNOSIS — Z880 Allergy status to penicillin: Secondary | ICD-10-CM

## 2023-12-05 DIAGNOSIS — Z882 Allergy status to sulfonamides status: Secondary | ICD-10-CM

## 2023-12-05 DIAGNOSIS — R54 Age-related physical debility: Secondary | ICD-10-CM | POA: Diagnosis present

## 2023-12-05 DIAGNOSIS — J441 Chronic obstructive pulmonary disease with (acute) exacerbation: Secondary | ICD-10-CM | POA: Diagnosis present

## 2023-12-05 DIAGNOSIS — R7989 Other specified abnormal findings of blood chemistry: Secondary | ICD-10-CM | POA: Insufficient documentation

## 2023-12-05 DIAGNOSIS — F17211 Nicotine dependence, cigarettes, in remission: Secondary | ICD-10-CM | POA: Diagnosis not present

## 2023-12-05 DIAGNOSIS — E210 Primary hyperparathyroidism: Secondary | ICD-10-CM | POA: Diagnosis present

## 2023-12-05 DIAGNOSIS — F32A Depression, unspecified: Secondary | ICD-10-CM | POA: Diagnosis present

## 2023-12-05 DIAGNOSIS — U071 COVID-19: Secondary | ICD-10-CM | POA: Insufficient documentation

## 2023-12-05 DIAGNOSIS — E785 Hyperlipidemia, unspecified: Secondary | ICD-10-CM | POA: Diagnosis present

## 2023-12-05 DIAGNOSIS — R0600 Dyspnea, unspecified: Secondary | ICD-10-CM

## 2023-12-05 HISTORY — DX: Atherosclerotic heart disease of native coronary artery without angina pectoris: I25.10

## 2023-12-05 HISTORY — DX: Polyneuropathy, unspecified: G62.9

## 2023-12-05 HISTORY — DX: Heart failure, unspecified: I50.9

## 2023-12-05 LAB — BASIC METABOLIC PANEL
Anion gap: 15 (ref 5–15)
BUN: 14 mg/dL (ref 8–23)
CO2: 24 mmol/L (ref 22–32)
Calcium: 8.8 mg/dL — ABNORMAL LOW (ref 8.9–10.3)
Chloride: 103 mmol/L (ref 98–111)
Creatinine, Ser: 0.74 mg/dL (ref 0.44–1.00)
GFR, Estimated: >60 mL/min (ref >60)
Glucose, Bld: 68 mg/dL — ABNORMAL LOW (ref 70–99)
Potassium: 3.6 mmol/L (ref 3.5–5.1)
Sodium: 142 mmol/L (ref 135–145)

## 2023-12-05 LAB — BLOOD GAS, VENOUS
Acid-Base Excess: 2.3 mmol/L — ABNORMAL HIGH (ref 0.0–2.0)
Bicarbonate: 29.1 mmol/L — ABNORMAL HIGH (ref 20.0–28.0)
O2 Saturation: 88.7 %
Patient temperature: 37
pCO2, Ven: 54 mmHg (ref 44–60)
pH, Ven: 7.34 (ref 7.25–7.43)
pO2, Ven: 58 mmHg — ABNORMAL HIGH (ref 32–45)

## 2023-12-05 LAB — CBC
HCT: 37.1 % (ref 36.0–46.0)
Hemoglobin: 11.5 g/dL — ABNORMAL LOW (ref 12.0–15.0)
MCH: 28 pg (ref 26.0–34.0)
MCHC: 31 g/dL (ref 30.0–36.0)
MCV: 90.3 fL (ref 80.0–100.0)
Platelets: 202 10*3/uL (ref 150–400)
RBC: 4.11 MIL/uL (ref 3.87–5.11)
RDW: 15.1 % (ref 11.5–15.5)
WBC: 9.6 10*3/uL (ref 4.0–10.5)
nRBC: 0 % (ref 0.0–0.2)

## 2023-12-05 LAB — TROPONIN I (HIGH SENSITIVITY)
Troponin I (High Sensitivity): 35 ng/L — ABNORMAL HIGH (ref <18)
Troponin I (High Sensitivity): 36 ng/L — ABNORMAL HIGH (ref <18)

## 2023-12-05 LAB — RESP PANEL BY RT-PCR (RSV, FLU A&B, COVID)  RVPGX2
Influenza A by PCR: NEGATIVE
Influenza B by PCR: NEGATIVE
Resp Syncytial Virus by PCR: NEGATIVE
SARS Coronavirus 2 by RT PCR: POSITIVE — AB

## 2023-12-05 LAB — BRAIN NATRIURETIC PEPTIDE: B Natriuretic Peptide: 253.8 pg/mL — ABNORMAL HIGH (ref 0.0–100.0)

## 2023-12-05 MED ORDER — HEPARIN SODIUM (PORCINE) 5000 UNIT/ML IJ SOLN
5000.0000 [IU] | Freq: Three times a day (TID) | INTRAMUSCULAR | Status: DC
  Administered 2023-12-06 (×3): 5000 [IU] via SUBCUTANEOUS
  Filled 2023-12-05 (×5): qty 1

## 2023-12-05 MED ORDER — ONDANSETRON HCL 4 MG/2ML IJ SOLN
4.0000 mg | Freq: Four times a day (QID) | INTRAMUSCULAR | Status: DC | PRN
  Administered 2023-12-08: 4 mg via INTRAVENOUS
  Filled 2023-12-05: qty 2

## 2023-12-05 MED ORDER — PREGABALIN 75 MG PO CAPS
150.0000 mg | ORAL_CAPSULE | Freq: Two times a day (BID) | ORAL | Status: DC
  Administered 2023-12-06 – 2023-12-08 (×5): 150 mg via ORAL
  Filled 2023-12-05 (×5): qty 2

## 2023-12-05 MED ORDER — METHYLPREDNISOLONE SODIUM SUCC 125 MG IJ SOLR
125.0000 mg | Freq: Once | INTRAMUSCULAR | Status: AC
  Administered 2023-12-05: 125 mg via INTRAVENOUS
  Filled 2023-12-05: qty 2

## 2023-12-05 MED ORDER — ASPIRIN 81 MG PO CHEW
81.0000 mg | CHEWABLE_TABLET | Freq: Every day | ORAL | Status: DC
  Administered 2023-12-06 – 2023-12-08 (×3): 81 mg via ORAL
  Filled 2023-12-05 (×3): qty 1

## 2023-12-05 MED ORDER — IPRATROPIUM-ALBUTEROL 0.5-2.5 (3) MG/3ML IN SOLN
3.0000 mL | Freq: Once | RESPIRATORY_TRACT | Status: AC
  Administered 2023-12-05: 3 mL via RESPIRATORY_TRACT
  Filled 2023-12-05: qty 3

## 2023-12-05 MED ORDER — AMLODIPINE BESYLATE 10 MG PO TABS
10.0000 mg | ORAL_TABLET | Freq: Every day | ORAL | Status: DC
  Administered 2023-12-06 – 2023-12-08 (×3): 10 mg via ORAL
  Filled 2023-12-05 (×3): qty 1

## 2023-12-05 MED ORDER — IPRATROPIUM-ALBUTEROL 0.5-2.5 (3) MG/3ML IN SOLN
9.0000 mL | Freq: Once | RESPIRATORY_TRACT | Status: AC
  Administered 2023-12-05: 9 mL via RESPIRATORY_TRACT
  Filled 2023-12-05: qty 9

## 2023-12-05 MED ORDER — IPRATROPIUM-ALBUTEROL 0.5-2.5 (3) MG/3ML IN SOLN: 3.0000 mL | Freq: Four times a day (QID) | RESPIRATORY_TRACT | Status: DC

## 2023-12-05 MED ORDER — ACETAMINOPHEN 325 MG PO TABS
650.0000 mg | ORAL_TABLET | Freq: Four times a day (QID) | ORAL | Status: DC | PRN
  Administered 2023-12-07 – 2023-12-08 (×5): 650 mg via ORAL
  Filled 2023-12-05 (×5): qty 2

## 2023-12-05 MED ORDER — SODIUM CHLORIDE 0.9 % IV SOLN
100.0000 mg | Freq: Once | INTRAVENOUS | Status: AC
  Administered 2023-12-05: 100 mg via INTRAVENOUS
  Filled 2023-12-05: qty 100

## 2023-12-05 MED ORDER — SENNOSIDES-DOCUSATE SODIUM 8.6-50 MG PO TABS: 1.0000 | ORAL_TABLET | Freq: Every evening | ORAL | Status: DC | PRN

## 2023-12-05 MED ORDER — HYDRALAZINE HCL 20 MG/ML IJ SOLN: 5.0000 mg | Freq: Four times a day (QID) | INTRAMUSCULAR | Status: DC | PRN

## 2023-12-05 MED ORDER — FAMOTIDINE 20 MG PO TABS
20.0000 mg | ORAL_TABLET | Freq: Two times a day (BID) | ORAL | Status: DC
  Administered 2023-12-06 – 2023-12-08 (×5): 20 mg via ORAL
  Filled 2023-12-05 (×5): qty 1

## 2023-12-05 MED ORDER — MOMETASONE FURO-FORMOTEROL FUM 200-5 MCG/ACT IN AERO
2.0000 | INHALATION_SPRAY | Freq: Two times a day (BID) | RESPIRATORY_TRACT | Status: DC
  Administered 2023-12-06 – 2023-12-08 (×5): 2 via RESPIRATORY_TRACT
  Filled 2023-12-05 (×2): qty 8.8

## 2023-12-05 MED ORDER — METHYLPREDNISOLONE SODIUM SUCC 40 MG IJ SOLR
40.0000 mg | Freq: Every day | INTRAMUSCULAR | Status: AC
  Administered 2023-12-06: 40 mg via INTRAVENOUS
  Filled 2023-12-05: qty 1

## 2023-12-05 MED ORDER — GUAIFENESIN 100 MG/5ML PO LIQD
5.0000 mL | Freq: Four times a day (QID) | ORAL | Status: DC | PRN
  Administered 2023-12-06 – 2023-12-08 (×4): 5 mL via ORAL
  Filled 2023-12-05 (×4): qty 10

## 2023-12-05 MED ORDER — ACETAMINOPHEN 650 MG RE SUPP: 650.0000 mg | Freq: Four times a day (QID) | RECTAL | Status: DC | PRN

## 2023-12-05 MED ORDER — CHLORTHALIDONE 25 MG PO TABS
12.5000 mg | ORAL_TABLET | Freq: Every day | ORAL | Status: DC
  Administered 2023-12-06 – 2023-12-08 (×3): 12.5 mg via ORAL
  Filled 2023-12-05 (×3): qty 0.5

## 2023-12-05 MED ORDER — ONDANSETRON HCL 4 MG PO TABS: 4.0000 mg | ORAL_TABLET | Freq: Four times a day (QID) | ORAL | Status: DC | PRN

## 2023-12-05 MED ORDER — PREGABALIN 75 MG PO CAPS
150.0000 mg | ORAL_CAPSULE | Freq: Once | ORAL | Status: AC
  Administered 2023-12-05: 150 mg via ORAL
  Filled 2023-12-05: qty 2

## 2023-12-05 MED ORDER — ROSUVASTATIN CALCIUM 20 MG PO TABS
20.0000 mg | ORAL_TABLET | Freq: Every day | ORAL | Status: DC
  Administered 2023-12-06 – 2023-12-07 (×2): 20 mg via ORAL
  Filled 2023-12-05 (×2): qty 1

## 2023-12-05 MED ORDER — HYDROCOD POLI-CHLORPHE POLI ER 10-8 MG/5ML PO SUER
5.0000 mL | Freq: Every evening | ORAL | Status: AC | PRN
  Administered 2023-12-07: 5 mL via ORAL
  Filled 2023-12-05: qty 5

## 2023-12-05 NOTE — ED Notes (Signed)
 CCMD notified that pt is moving from ED6 to ED30

## 2023-12-05 NOTE — ED Triage Notes (Signed)
 Pt to ED for SOB since 2 days. Hx COPD. Breathing is labored.SPO2 84-88% on RA. Placed on 2L then 3L, 92% on 3L. Has been using inhalers. Also has cough since today after doing breathing tx. Respirations are labored.  Lab stick in triage.

## 2023-12-05 NOTE — Assessment & Plan Note (Addendum)
 DuoNebs 4 times daily, 3 doses ordered for 12/06/2023 Solu-Medrol 40 mg IV one-time dose for 3/5 Symptomatic support with antitussive agents Continue oxygen supplementation to maintain SpO2 greater than 92% Continuous pulse oximetry Given COVID-19 infection, postviral MRSA pneumonia cannot be excluded at this time Check MRSA PCR, if positive will initiate vancomycin per pharmacy

## 2023-12-05 NOTE — ED Notes (Signed)
 CCMD called and pt placed on tele.

## 2023-12-05 NOTE — Hospital Course (Addendum)
 [name] is a 63 year old female with history of depression, anxiety, neuropathy, hyperlipidemia, hypertension, history of COPD, who presents to the emergency department for chief concerns of shortness of breath for 2 days.  Vitals in the ED showed temperature nine 9.1, respiration 24, heart rate 83, 160/56, SpO2 of 93% on 3 L nasal cannula.  Per EDP, patient desatted to 87/88% on room air and was placed on 3 L nasal cannula with improvement to the low 90s.  Serum sodium is 142, potassium 3.6, chloride 103, bicarb 24, BUN of 14, serum creatinine 0.474, EGFR greater than 60, nonfasting blood glucose 68, WBC 9.6, hemoglobin 11.5, platelets of 202.  BNP elevated at 253.8.  High sensitive troponin was 35 and on repeat was 36.  ED treatment: Solu-Medrol 125 mg IV one-time dose, DuoNebs one-time treatment, Lyrica 150 mg p.o. one-time dose.

## 2023-12-05 NOTE — H&P (Addendum)
 History and Physical   Cindy Gutierrez:[id number] DOB: Oct 17, 1960 DOA: 12/05/2023  PCP: [facility]  Patient coming from: Shortness of breath for 2 days  I have personally briefly reviewed patient's old medical records in [facility] EMR.  Chief Concern: Shortness of breath for 2 days, cough  HPI: Cindy Gutierrez is a 63 year old female with history of depression, anxiety, neuropathy, hyperlipidemia, hypertension, history of COPD, who presents to the emergency department for chief concerns of shortness of breath for 2 days.  Vitals in the ED showed temperature nine 9.1, respiration 24, heart rate 83, 160/56, SpO2 of 93% on 3 L nasal cannula.  Per EDP, patient desatted to 87/88% on room air and was placed on 3 L nasal cannula with improvement to the low 90s.  Serum sodium is 142, potassium 3.6, chloride 103, bicarb 24, BUN of 14, serum creatinine 0.474, EGFR greater than 60, nonfasting blood glucose 68, WBC 9.6, hemoglobin 11.5, platelets of 202.  BNP elevated at 253.8.  High sensitive troponin was 35 and on repeat was 36.  ED treatment: Solu-Medrol 125 mg IV one-time dose, DuoNebs one-time treatment, Lyrica 150 mg p.o. one-time dose. ------------------------------ At bedside, patient able to tell me her first and last name, age, location, current calendar year.  She reports worsening shortness of breath over the last 2 days.  She denies known sick contacts and does not know how she got COVID-19.  She reports she has been having difficulty coughing however after being admitted to the hospital, she did have a cough reflex and is coughing up thick green sputum.  She reports that over the past several months, she has noticed days where her O2 dropped to 82% on room air.  She reports that she knows she needs oxygen supplementation however has been refused/declined by health insurance in the past because it never dropped below 88.  Patient reports that during prior testing, she  has had oxygen levels at 89%.  She denies trauma to her person, chest pain, dysuria, hematuria, diarrhea, new swelling of her lower extremities.  She denies blood in her stool.  Social history: She lives at home.  She denies tobacco, EtOH, recreational drug use.  She is retired.  ROS: Constitutional: no weight change, no fever ENT/Mouth: no sore throat, no rhinorrhea Eyes: no eye pain, no vision changes Cardiovascular: no chest pain, + dyspnea,  no edema, no palpitations Respiratory: + cough, + sputum, no wheezing Gastrointestinal: no nausea, no vomiting, no diarrhea, no constipation Genitourinary: no urinary incontinence, no dysuria, no hematuria Musculoskeletal: no arthralgias, no myalgias Skin: no skin lesions, no pruritus, Neuro: + weakness, no loss of consciousness, no syncope Psych: no anxiety, no depression, no decrease appetite Heme/Lymph: no bruising, no bleeding  ED Course: Discussed with the EDP, patient requiring hospitalization for chief concerns of COPD exacerbation.  Assessment/Plan  Principal Problem:   COPD exacerbation (HCC) Active Problems:   Acute hypoxic respiratory failure (HCC)   Essential hypertension   Dyslipidemia   Depression   Primary hyperparathyroidism (HCC)   Nicotine dependence   COVID-19 virus infection   Cough   Elevated troponin   Assessment and Plan:  * COPD exacerbation (HCC) DuoNebs 4 times daily, 3 doses ordered for 12/06/2023 Solu-Medrol 40 mg IV one-time dose for 3/5 Symptomatic support with antitussive agents Continue oxygen supplementation to maintain SpO2 greater than 92% Continuous pulse oximetry Given COVID-19 infection, postviral MRSA pneumonia cannot be excluded at this time Check MRSA PCR, if positive will initiate vancomycin per  pharmacy  Acute hypoxic respiratory failure (HCC) Continuous pulse oximetry ordered Continue oxygen supplementation to maintain SpO2 greater than 92% Patient would benefit from being discharged  home with O2 supplementation at least as needed. Otherwise, extended discussion with patient at bedside that showed her O2 saturation dropped below 88%, she needs to call EMS right away to come to the ED for evaluation I will also discussed that she needs to discuss with her PCP for further evaluation regarding O2 supplementation. Patient endorses understanding and compliance In team to consider ambulating patient's with oxygen monitoring, as patient would likely benefit from as needed O2 supplementation on discharge TOC consulted for possible DME need/oxygen supply  Essential hypertension Amlodipine 10 mg daily, chlorthalidone 12.5 mg daily were resumed on admission Hydralazine 5 mg IV every 6 hours as needed for SBP greater than 165, 5 days ordered  Dyslipidemia Rosuvastatin 20 mg daily resumed  Elevated troponin Suspect secondary to demand ischemia in setting of acute hypoxic respiratory failure secondary to COVID-19 infection Low clinical suspicion for ACS at this time  Cough Guaifenesin as needed to loosen phlegm, 3 days ordered Tussionex nightly as needed for cough, 2 days ordered  COVID-19 virus infection Droplet and airborne precaution Check MRSA PCR, if positive will initiate vancomycin per pharmacy for post viral MRSA pneumonia coverage  Chart reviewed.   DVT prophylaxis: Heparin 5000 units subcutaneous every 8 hours Code Status: Full code Diet: Heart healthy Family Communication: A phone call has been offered, patient declined stating that her son dropped her off at the hospital.  He knows that she was sick and is being admitted. Disposition Plan: Pending clinical course Consults called: None at this time Admission status: Telemetry medical, inpatient  Past Medical History:  Diagnosis Date   Aortic valve regurgitation    CHF (congestive heart failure) (HCC)    COPD (chronic obstructive pulmonary disease) (HCC)    Coronary artery disease    Depression    Hepatitis  C    Hyperparathyroidism (HCC)    Hypertension    Mitral valve regurgitation    Neuropathy    to R arm only   Non-toxic multinodular goiter    Pulmonary hypertension (HCC) 2022   noted on CT   Pulmonary nodule    Thyroid disease    Past Surgical History:  Procedure Laterality Date   COLONOSCOPY WITH PROPOFOL N/A 08/21/2018   Procedure: COLONOSCOPY WITH PROPOFOL;  Surgeon: Toney Reil, MD;  Location: [facility];  Service: Gastroenterology;  Laterality: N/A;   PARATHYROIDECTOMY Left    TUBAL LIGATION      Social History:  reports that she quit smoking about 30 years ago. Her smoking use included cigarettes. She started smoking about 2 years ago. She has never used smokeless tobacco. She reports that she does not drink alcohol and does not use drugs.  Allergies  Allergen Reactions   Sulfa Antibiotics Hives and Rash   Bee Pollen Hives   Lisinopril Nausea Only   Losartan    Penicillins Hives   Penicillins    Sulfa Antibiotics    Tiotropium Bromide Monohydrate     Other reaction(s): Cough   Carvedilol    Family History  Problem Relation Age of Onset   Alzheimer's disease Mother    Heart disease Father    Family history: Family history reviewed and not pertinent.  Prior to Admission medications   Medication Sig Start Date End Date Taking? Authorizing Provider  acetaminophen (TYLENOL) 325 MG tablet Take 650 mg by mouth  3 (three) times daily as needed for mild pain (pain score 1-3).    [provider]  albuterol (PROVENTIL) (2.5 MG/3ML) 0.083% nebulizer solution Take 2.5 mg by nebulization every 4 (four) hours as needed for wheezing or shortness of breath.    [provider]  albuterol (VENTOLIN HFA) 108 (90 Base) MCG/ACT inhaler Inhale 2 puffs into the lungs every 6 (six) hours as needed for wheezing or shortness of breath. 10/19/23   Wouk, Wilfred Curtis, MD  amLODipine (NORVASC) 10 MG tablet Take 1 tablet (10 mg total) by mouth daily. 10/20/23    Wouk, Wilfred Curtis, MD  ascorbic acid (VITAMIN C) 500 MG tablet Take 1 tablet (500 mg total) by mouth daily. 11/21/23 02/19/24  Gillis Santa, MD  aspirin 81 MG chewable tablet Chew 81 mg by mouth daily. 04/13/22   [provider]  budesonide-formoterol (SYMBICORT) 160-4.5 MCG/ACT inhaler Inhale 2 puffs into the lungs 2 (two) times daily.    [provider]  calcium carbonate (TUMS - DOSED IN MG ELEMENTAL CALCIUM) 500 MG chewable tablet Chew 1 tablet by mouth daily. As needed    [provider]  calcium citrate (CALCITRATE - DOSED IN MG ELEMENTAL CALCIUM) 950 (200 Ca) MG tablet Take 200 mg of elemental calcium by mouth daily.    [provider]  celecoxib (CELEBREX) 200 MG capsule Take 200 mg by mouth 2 (two) times daily.    [provider]  chlorthalidone (HYGROTON) 25 MG tablet Take 0.5 tablets (12.5 mg total) by mouth daily. 10/20/23   Wouk, Wilfred Curtis, MD  cyanocobalamin 1000 MCG tablet Take 1 tablet (1,000 mcg total) by mouth daily. 11/22/23 02/20/24  Gillis Santa, MD  Dextromethorphan-guaiFENesin 20-200 MG/20ML LIQD Take 20 mLs by mouth every 8 (eight) hours as needed. 11/20/23   Gillis Santa, MD  diclofenac Sodium (VOLTAREN) 1 % GEL Apply 2 g topically 4 (four) times daily. 09/21/22   [provider]  escitalopram (LEXAPRO) 10 MG tablet Take 10 mg by mouth daily.    [provider]  famotidine (PEPCID) 20 MG tablet Take 20 mg by mouth 2 (two) times daily.    [provider]  furosemide (LASIX) 20 MG tablet Take 20 mg by mouth daily. 08/01/23   [provider]  Glecaprevir-Pibrentasvir (MAVYRET) 100-40 MG TABS Take 3 tablets by mouth daily.    [provider]  iron polysaccharides (NIFEREX) 150 MG capsule Take 1 capsule (150 mg total) by mouth daily. 11/21/23 02/19/24  Gillis Santa, MD  loratadine (CLARITIN) 10 MG tablet Take 1 tablet (10 mg total) by mouth daily as needed for allergies or rhinitis. 11/20/23  11/19/24  Gillis Santa, MD  pregabalin (LYRICA) 150 MG capsule Take 150 mg by mouth 2 (two) times daily.    [provider]  rosuvastatin (CRESTOR) 20 MG tablet Take 20 mg by mouth daily.    [provider]  Tiotropium Bromide Monohydrate (SPIRIVA RESPIMAT) 2.5 MCG/ACT AERS Inhale 2 puffs into the lungs daily.    [provider]  Vitamin D, Ergocalciferol, (DRISDOL) 1.25 MG (50000 UNIT) CAPS capsule Take 1 capsule (50,000 Units total) by mouth every 7 (seven) days. 11/26/23 02/24/24  Gillis Santa, MD    Physical Exam: Vitals:   12/05/23 1829 12/05/23 1832 12/05/23 1835 12/05/23 2129  BP:  (!) 160/56    Pulse:  83  94  Resp:  (!) 24  20  Temp:  99.1 F (37.3 C)    TempSrc:  Oral  SpO2: (!) 86% 93%  91%  Weight:   81 kg   Height:   5\' 8"  (1.727 m)    Constitutional: appears age-appropriate, frail, coughing Eyes: PERRL, lids and conjunctivae normal ENMT: Mucous membranes are moist. Posterior pharynx clear of any exudate or lesions. Age-appropriate dentition. Hearing appropriate Neck: normal, supple, no masses, no thyromegaly Respiratory: clear to auscultation bilaterally,+ wheezing, no crackles. Normal respiratory effort. No accessory muscle use.  Cardiovascular: Regular rate and rhythm, no murmurs / rubs / gallops. No extremity edema. 2+ pedal pulses. No carotid bruits.  Abdomen: no tenderness, no masses palpated, no hepatosplenomegaly. Bowel sounds positive.  Musculoskeletal: no clubbing / cyanosis. No joint deformity upper and lower extremities. Good ROM, no contractures, no atrophy. Normal muscle tone.  Skin: no rashes, lesions, ulcers. No induration Neurologic: Sensation intact. Strength 5/5 in all 4.  Psychiatric: Normal judgment and insight. Alert and oriented x 3. Normal mood.   EKG: independently reviewed, showing sinus rhythm with rate of 87, QTc 493  Chest x-ray on Admission: I personally reviewed and I agree with radiologist reading as  below.  DG Chest Port 1 View Result Date: 12/05/2023 CLINICAL DATA:  Cough and shortness of breath with labored breathing. EXAM: PORTABLE CHEST 1 VIEW COMPARISON:  November 18, 2023 FINDINGS: The heart size and mediastinal contours are within normal limits. The lungs are hyperinflated. There is no evidence of an acute infiltrate, pleural effusion or pneumothorax. Radiopaque surgical clips are seen overlying the neck soft tissues. Chronic left-sided rib fractures are noted. The visualized skeletal structures are otherwise unremarkable. IMPRESSION: No active cardiopulmonary disease. Electronically Signed   By: Aram Candela M.D.   On: 12/05/2023 21:14   Labs on Admission: I have personally reviewed following labs  CBC: Recent Labs  Lab 12/05/23 1942  WBC 9.6  HGB 11.5*  HCT 37.1  MCV 90.3  PLT 202   Basic Metabolic Panel: Recent Labs  Lab 12/05/23 1942  NA 142  K 3.6  CL 103  CO2 24  GLUCOSE 68*  BUN 14  CREATININE 0.74  CALCIUM 8.8*   GFR: Estimated Creatinine Clearance: 80.3 mL/min (by C-G formula based on SCr of 0.74 mg/dL).  Urine analysis:    Component Value Date/Time   COLORURINE YELLOW (A) 11/18/2023 1223   APPEARANCEUR CLEAR (A) 11/18/2023 1223   LABSPEC 1.016 11/18/2023 1223   PHURINE 5.0 11/18/2023 1223   GLUCOSEU NEGATIVE 11/18/2023 1223   HGBUR NEGATIVE 11/18/2023 1223   BILIRUBINUR NEGATIVE 11/18/2023 1223   KETONESUR NEGATIVE 11/18/2023 1223   PROTEINUR 100 (A) 11/18/2023 1223   NITRITE NEGATIVE 11/18/2023 1223   LEUKOCYTESUR NEGATIVE 11/18/2023 1223   This document was prepared using Dragon Voice Recognition software and may include unintentional dictation errors.  Dr. Sedalia Muta [facility]  If 7PM-7AM, please contact overnight-coverage provider If 7AM-7PM, please contact day attending provider [email or web address]  12/05/2023, 11:27 PM

## 2023-12-05 NOTE — Assessment & Plan Note (Signed)
 Guaifenesin as needed to loosen phlegm, 3 days ordered Tussionex nightly as needed for cough, 2 days ordered

## 2023-12-05 NOTE — Assessment & Plan Note (Signed)
-   Rosuvastatin 20 mg daily resumed 

## 2023-12-05 NOTE — Assessment & Plan Note (Addendum)
 Suspect secondary to demand ischemia in setting of acute hypoxic respiratory failure secondary to COVID-19 infection Low clinical suspicion for ACS at this time

## 2023-12-05 NOTE — Assessment & Plan Note (Addendum)
 Droplet and airborne precaution Check MRSA PCR, if positive will initiate vancomycin per pharmacy for post viral MRSA pneumonia coverage

## 2023-12-05 NOTE — ED Notes (Addendum)
 Pt removed from oxygen by Tan, MD. Pt dropped to 90% on RA.provider notified and ordered Oxygen to be replaced at this time.

## 2023-12-05 NOTE — Assessment & Plan Note (Addendum)
 Continuous pulse oximetry ordered Continue oxygen supplementation to maintain SpO2 greater than 92% Patient would benefit from being discharged home with O2 supplementation at least as needed. Otherwise, extended discussion with patient at bedside that showed her O2 saturation dropped below 88%, she needs to call EMS right away to come to the ED for evaluation I will also discussed that she needs to discuss with her PCP for further evaluation regarding O2 supplementation. Patient endorses understanding and compliance In team to consider ambulating patient's with oxygen monitoring, as patient would likely benefit from as needed O2 supplementation on discharge TOC consulted for possible DME need/oxygen supply

## 2023-12-05 NOTE — Assessment & Plan Note (Signed)
 Amlodipine 10 mg daily, chlorthalidone 12.5 mg daily were resumed on admission Hydralazine 5 mg IV every 6 hours as needed for SBP greater than 165, 5 days ordered

## 2023-12-05 NOTE — ED Provider Notes (Signed)
 [facility] Provider Note    Event Date/Time   First MD Initiated Contact with Patient 12/05/23 1854     (approximate)   History   Shortness of Breath, Cough, and COPD   HPI  Cindy Gutierrez is a 63 y.o. female history of COPD not on oxygen, CHF, CAD, hypertension, presenting with shortness of breath and cough.  States that he started 2 days ago.  Has been using her inhalers without any improvement.  Was found to be 84 to 88% on room air.  Placed on 3 L of nasal cannula with improvement to her pulse ox to 93%.  Denies any fever, no chest pain.  On independent chart review she was admitted in mid February for hypoxemic respiratory failure due to COPD exacerbation.  Has had multiple admissions for respiratory failure and COPD exacerbation.  Was initially on supplemental oxygen but was able to get weaned off.    Physical Exam   Triage Vital Signs: ED Triage Vitals  Encounter Vitals Group     BP 12/05/23 1832 (!) 160/56     Systolic BP Percentile --      Diastolic BP Percentile --      Pulse Rate 12/05/23 1832 83     Resp 12/05/23 1832 (!) 24     Temp 12/05/23 1832 99.1 F (37.3 C)     Temp Source 12/05/23 1832 Oral     SpO2 12/05/23 1829 (!) 86 %     Weight 12/05/23 1835 178 lb 9.2 oz (81 kg)     Height 12/05/23 1835 5\' 8"  (1.727 m)     Head Circumference --      Peak Flow --      Pain Score 12/05/23 1829 0     Pain Loc --      Pain Education --      Exclude from Growth Chart --     Most recent vital signs: Vitals:   12/05/23 1832 12/05/23 2129  BP: (!) 160/56   Pulse: 83 94  Resp: (!) 24 20  Temp: 99.1 F (37.3 C)   SpO2: 93% 91%     General: Awake, no distress.  CV:  Good peripheral perfusion.  Resp:  Normal effort.  Diminished breath sounds bilaterally, tachypneic Abd:  No distention.  Soft nontender Other:  Trace lower extremity edema.  No unilateral calf swelling or tenderness.   ED Results / Procedures / Treatments    Labs (all labs ordered are listed, but only abnormal results are displayed) Labs Reviewed  RESP PANEL BY RT-PCR (RSV, FLU A&B, COVID)  RVPGX2 - Abnormal; Notable for the following components:      Result Value   SARS Coronavirus 2 by RT PCR POSITIVE (*)    All other components within normal limits  BASIC METABOLIC PANEL - Abnormal; Notable for the following components:   Glucose, Bld 68 (*)    Calcium 8.8 (*)    All other components within normal limits  CBC - Abnormal; Notable for the following components:   Hemoglobin 11.5 (*)    All other components within normal limits  BRAIN NATRIURETIC PEPTIDE - Abnormal; Notable for the following components:   B Natriuretic Peptide 253.8 (*)    All other components within normal limits  BLOOD GAS, VENOUS - Abnormal; Notable for the following components:   pO2, Ven 58 (*)    Bicarbonate 29.1 (*)    Acid-Base Excess 2.3 (*)    All other components within  normal limits  TROPONIN I (HIGH SENSITIVITY) - Abnormal; Notable for the following components:   Troponin I (High Sensitivity) 35 (*)    All other components within normal limits  TROPONIN I (HIGH SENSITIVITY) - Abnormal; Notable for the following components:   Troponin I (High Sensitivity) 36 (*)    All other components within normal limits  MRSA NEXT GEN BY PCR, NASAL  BASIC METABOLIC PANEL  CBC     EKG  EKG shows sinus rhythm, rate 87, normal QS, normal QTc, no ischemic ST elevation, not significant change compared to prior   RADIOLOGY Chest x-ray on my interpretation without focal consolidation   PROCEDURES:  Critical Care performed: Yes, see critical care procedure note(s)  .Critical Care  Performed by: Claybon Jabs, MD Authorized by: Claybon Jabs, MD   Critical care provider statement:    Critical care time (minutes):  35   Critical care was necessary to treat or prevent imminent or life-threatening deterioration of the following conditions:  Respiratory failure    Critical care was time spent personally by me on the following activities:  Development of treatment plan with patient or surrogate, discussions with consultants, evaluation of patient's response to treatment, examination of patient, ordering and review of laboratory studies, ordering and review of radiographic studies, ordering and performing treatments and interventions, pulse oximetry, re-evaluation of patient's condition and review of old charts    MEDICATIONS ORDERED IN ED: Medications  doxycycline (VIBRAMYCIN) 100 mg in sodium chloride 0.9 % 250 mL IVPB (100 mg Intravenous New Bag/Given 12/05/23 2032)  ipratropium-albuterol (DUONEB) 0.5-2.5 (3) MG/3ML nebulizer solution 3 mL (has no administration in time range)  aspirin chewable tablet 81 mg (has no administration in time range)  acetaminophen (TYLENOL) tablet 650 mg (has no administration in time range)    Or  acetaminophen (TYLENOL) suppository 650 mg (has no administration in time range)  ondansetron (ZOFRAN) tablet 4 mg (has no administration in time range)    Or  ondansetron (ZOFRAN) injection 4 mg (has no administration in time range)  heparin injection 5,000 Units (has no administration in time range)  senna-docusate (Senokot-S) tablet 1 tablet (has no administration in time range)  methylPREDNISolone sodium succinate (SOLU-MEDROL) 125 mg/2 mL injection 125 mg (125 mg Intravenous Given 12/05/23 1943)  ipratropium-albuterol (DUONEB) 0.5-2.5 (3) MG/3ML nebulizer solution 9 mL (9 mLs Nebulization Given 12/05/23 1927)  pregabalin (LYRICA) capsule 150 mg (150 mg Oral Given 12/05/23 1926)     IMPRESSION / MDM / ASSESSMENT AND PLAN / ED COURSE  I reviewed the triage vital signs and the nursing notes.                              Differential diagnosis includes, but is not limited to, COPD exacerbation, CHF, atypical ACS, pneumonia, viral illness such as influenza, COVID, RSV.  Will get labs, EKG, troponin, chest x-ray, BNP, will give her  DuoNebs, Solu-Medrol, doxycycline.  Patient's presentation is most consistent with acute presentation with potential threat to life or bodily function.  Independent review of labs and imaging are below.  Given her hypoxia, COPD exacerbation, COVID diagnosis, patient is at high risk and needs to be admitted.  Consult to hospitalist who will admit the patient.  She is admitted.  Clinical Course as of 12/05/23 2158  Tue Dec 05, 2023  2109 Troponin I (High Sensitivity)(!) Troponin x 2 is stable. [TT]  2109 Brain natriuretic peptide(!) Downtrending compared to  prior [TT]  2109 Resp panel by RT-PCR (RSV, Flu A&B, Covid) Anterior Nasal Swab(!) COVID-positive. [TT]  2109 Independent review of labs, no leukocytosis, electrolytes not severely deranged, creatinine is normal, VBG pH and pCO2 are normal. [TT]  2110 On reassessment patient is feeling a lot better, no tachypnea or increased work of breathing, turned off her oxygen and she was satting 95%.  Discussed with her about laboratory findings including COVID-positive results.   [TT]  2131 When back to reassess and she is now 91%, having some shortness of breath.  Will plan to have her admitted for further management. [TT]    Clinical Course User Index [TT] Jodie Echevaria, Franchot Erichsen, MD     FINAL CLINICAL IMPRESSION(S) / ED DIAGNOSES   Final diagnoses:  Acute cough  Dyspnea, unspecified type  COPD exacerbation (HCC)  COVID  Hypoxia     Rx / DC Orders   ED Discharge Orders     None        Note:  This document was prepared using Dragon voice recognition software and may include unintentional dictation errors.    Claybon Jabs, MD 12/05/23 2158

## 2023-12-06 DIAGNOSIS — J9601 Acute respiratory failure with hypoxia: Secondary | ICD-10-CM | POA: Diagnosis not present

## 2023-12-06 DIAGNOSIS — I10 Essential (primary) hypertension: Secondary | ICD-10-CM

## 2023-12-06 DIAGNOSIS — J441 Chronic obstructive pulmonary disease with (acute) exacerbation: Secondary | ICD-10-CM

## 2023-12-06 DIAGNOSIS — E785 Hyperlipidemia, unspecified: Secondary | ICD-10-CM

## 2023-12-06 DIAGNOSIS — U071 COVID-19: Secondary | ICD-10-CM

## 2023-12-06 DIAGNOSIS — F17200 Nicotine dependence, unspecified, uncomplicated: Secondary | ICD-10-CM

## 2023-12-06 DIAGNOSIS — E210 Primary hyperparathyroidism: Secondary | ICD-10-CM

## 2023-12-06 DIAGNOSIS — R7989 Other specified abnormal findings of blood chemistry: Secondary | ICD-10-CM

## 2023-12-06 LAB — CBC
HCT: 34.3 % — ABNORMAL LOW (ref 36.0–46.0)
Hemoglobin: 10.8 g/dL — ABNORMAL LOW (ref 12.0–15.0)
MCH: 28.3 pg (ref 26.0–34.0)
MCHC: 31.5 g/dL (ref 30.0–36.0)
MCV: 89.8 fL (ref 80.0–100.0)
Platelets: 155 10*3/uL (ref 150–400)
RBC: 3.82 MIL/uL — ABNORMAL LOW (ref 3.87–5.11)
RDW: 14.9 % (ref 11.5–15.5)
WBC: 6.6 10*3/uL (ref 4.0–10.5)
nRBC: 0 % (ref 0.0–0.2)

## 2023-12-06 LAB — BASIC METABOLIC PANEL
Anion gap: 13 (ref 5–15)
BUN: 15 mg/dL (ref 8–23)
CO2: 21 mmol/L — ABNORMAL LOW (ref 22–32)
Calcium: 8.2 mg/dL — ABNORMAL LOW (ref 8.9–10.3)
Chloride: 105 mmol/L (ref 98–111)
Creatinine, Ser: 0.75 mg/dL (ref 0.44–1.00)
GFR, Estimated: >60 mL/min (ref >60)
Glucose, Bld: 175 mg/dL — ABNORMAL HIGH (ref 70–99)
Potassium: 3.5 mmol/L (ref 3.5–5.1)
Sodium: 139 mmol/L (ref 135–145)

## 2023-12-06 LAB — MRSA NEXT GEN BY PCR, NASAL: MRSA by PCR Next Gen: NOT DETECTED

## 2023-12-06 MED ORDER — METHYLPREDNISOLONE SODIUM SUCC 40 MG IJ SOLR
40.0000 mg | Freq: Every day | INTRAMUSCULAR | Status: DC
  Administered 2023-12-07 – 2023-12-08 (×2): 40 mg via INTRAVENOUS
  Filled 2023-12-06 (×2): qty 1

## 2023-12-06 MED ORDER — DOXYCYCLINE HYCLATE 100 MG PO TABS
100.0000 mg | ORAL_TABLET | Freq: Two times a day (BID) | ORAL | Status: DC
  Administered 2023-12-06 – 2023-12-08 (×5): 100 mg via ORAL
  Filled 2023-12-06 (×5): qty 1

## 2023-12-06 MED ORDER — IPRATROPIUM-ALBUTEROL 20-100 MCG/ACT IN AERS
1.0000 | INHALATION_SPRAY | Freq: Four times a day (QID) | RESPIRATORY_TRACT | Status: AC
  Administered 2023-12-06 (×3): 1 via RESPIRATORY_TRACT
  Filled 2023-12-06: qty 4

## 2023-12-06 NOTE — TOC Initial Note (Signed)
 Transition of Care [facility]) - Initial/Assessment Note    Patient Details  Name: Cindy Gutierrez MRN: [id number] Date of Birth: 27-Sep-1961  Transition of Care [facility]) CM/SW Contact:    Marlowe Sax, RN Phone Number: 12/06/2023, 3:39 PM  Clinical Narrative:                  Hx COPD. Breathing is labored.SPO2 84-88% on RA. Placed on 2L then 3L, 92% on 3L. Has been using inhalers Anticipate home with [facility]  Expected Discharge Plan: Home w Home Health Services Barriers to Discharge: Continued Medical Work up   Patient Goals and CMS Choice            Expected Discharge Plan and Services   Discharge Planning Services: CM Consult                                          Prior Living Arrangements/Services   Lives with:: Self Patient language and need for interpreter reviewed:: Yes Do you feel safe going back to the place where you live?: Yes      Need for Family Participation in Patient Care: Yes (Comment) Care giver support system in place?: Yes (comment)   Criminal Activity/Legal Involvement Pertinent to Current Situation/Hospitalization: No - Comment as needed  Activities of Daily Living   ADL Screening (condition at time of admission) Independently performs ADLs?: Yes (appropriate for developmental age) Is the patient deaf or have difficulty hearing?: No Does the patient have difficulty seeing, even when wearing glasses/contacts?: No Does the patient have difficulty concentrating, remembering, or making decisions?: No  Permission Sought/Granted   Permission granted to share information with : Yes, Verbal Permission Granted              Emotional Assessment Appearance:: Appears stated age Attitude/Demeanor/Rapport: Engaged Affect (typically observed): Pleasant Orientation: : Oriented to Self, Oriented to Place, Oriented to  Time, Oriented to Situation Alcohol / Substance Use: Not Applicable Psych Involvement: No (comment)  Admission diagnosis:   Hypoxia [R09.02] COPD exacerbation (HCC) [J44.1] Dyspnea, unspecified type [R06.00] COVID [U07.1] Acute cough [R05.1] Patient Active Problem List   Diagnosis Date Noted   COVID-19 virus infection 12/05/2023   Cough 12/05/2023   Elevated troponin 12/05/2023   Acute hypoxic respiratory failure (HCC) 11/18/2023   Acute respiratory failure (HCC) 10/16/2023   NSTEMI (non-ST elevated myocardial infarction) (HCC) 10/16/2023   SIRS (systemic inflammatory response syndrome) (HCC) 10/16/2023   Hypertensive emergency 10/16/2023   Dyslipidemia 09/18/2023   Peripheral neuropathy 09/18/2023   Hypokalemia 09/18/2023   Severe sepsis (HCC) 09/24/2022   Influenza A with pneumonia 09/23/2022   Nicotine dependence 09/23/2022   Onychomycosis 09/23/2022   COPD exacerbation (HCC) 09/23/2022   Hyperglycemia 09/23/2022   Lactic acidosis 09/23/2022   Acute on chronic respiratory failure with hypoxia (HCC) 05/23/2021   Aortic valve regurgitation 05/19/2021   Depression 05/19/2021   Primary hyperparathyroidism (HCC) 05/19/2021   Lymphedema, not elsewhere classified 05/19/2021   Major depressive disorder, single episode, 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/19/2021   Other chest pain 05/19/2021   Pulmonary nodule 05/19/2021   Viral hepatitis C 04/02/2021   Colon cancer screening    Heart murmur 10/20/2017   Essential hypertension 06/13/2017   Tobacco use 06/13/2017   Herpes simplex 05/31/2017   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 [place] [address] Phone: [phone]  Fax: [phone]     Social Drivers of Health (SDOH) Social History: SDOH Screenings   Food Insecurity: No Food Insecurity (12/06/2023)  Recent Concern: Food Insecurity - Food Insecurity Present (09/19/2023)  Housing: Unknown (12/06/2023)  Transportation Needs: No Transportation Needs (12/06/2023)  Utilities: Not At Risk (12/06/2023)  Depression (PHQ2-9): High Risk (09/08/2022)  Tobacco Use: Medium Risk (12/05/2023)   SDOH Interventions:     Readmission Risk Interventions     No data to display

## 2023-12-06 NOTE — Progress Notes (Signed)
 Progress Note   Patient: Cindy Gutierrez ZOX:[id number] DOB: 1961-09-22 DOA: 12/05/2023     1 DOS: the patient was seen and examined on 12/06/2023   Brief hospital course: Ms. Kambra Beachem is a 63 year old female with history of depression, anxiety, neuropathy, hyperlipidemia, hypertension, history of COPD, who presents to the emergency department for chief concerns of shortness of breath for 2 days.  Patient is admitted for evaluation of acute hypoxic respiratory failure secondary to COPD exacerbation, tested positive for COVID.  Assessment and Plan: * COPD exacerbation (HCC) COVID infection Acute hypoxic respiratory failure (HCC) Continue oxygen supplementation to maintain SpO2 greater than 92% Continuous pulse oximetry. Continue DuoNebs q4 PRN Continue Solu-Medrol 40 mg IV daily and plan to taper to oral in next 2 days.  Continue antitussive agents. Continue COVID isolation precautions. MRSA negative. Will give her doxy for 3 days. TOC for possible DME need/oxygen supply. Pulmonary clinic follow up after discharge will be arranged.  Essential hypertension Continue Amlodipine 10 mg daily, chlorthalidone 12.5 mg.  Dyslipidemia Continue Rosuvastatin 20 mg  Elevated troponin Suspect secondary to demand ischemia in setting of acute hypoxic respiratory failure secondary to COVID-19 infection Low clinical suspicion for ACS at this time    Out of bed to chair. Incentive spirometry. Nursing supportive care. Fall, aspiration precautions. DVT prophylaxis   Code Status: Full Code Subjective: Patient is seen and examined today morning.  She is complaining of cough, has dyspnea even with resting.  Patient is currently on 4 L supplemental oxygen.  Physical Exam: Vitals:   12/06/23 0210 12/06/23 0212 12/06/23 0407 12/06/23 0816  BP: (!) 164/59  (!) 173/70 (!) 174/57  Pulse: 74  82 72  Resp: 16  18 17   Temp:  98.2 F (36.8 C) 98.6 F (37 C) 98.3 F (36.8 C)  TempSrc:  Oral Oral    SpO2: 100% 100% 100% 100%  Weight:      Height:        General - Elderly can American female, in respiratory distress while talking HEENT - PERRLA, EOMI, atraumatic head, non tender sinuses. Lung - Clear, diffuse rhonchi, wheezes.  Using accessory muscles Heart - S1, S2 heard, no murmurs, rubs, trace pedal edema. Abdomen - Soft, non tender, bowel sounds good Neuro - Alert, awake and oriented x 3, non focal exam. Skin - Warm and dry.  Data Reviewed:      Latest Ref Rng & Units 12/06/2023    2:07 AM 12/05/2023    7:42 PM 11/20/2023    8:58 AM  CBC  WBC 4.0 - 10.5 K/uL 6.6  9.6  10.7   Hemoglobin 12.0 - 15.0 g/dL 81.1  91.4  78.2   Hematocrit 36.0 - 46.0 % 34.3  37.1  33.6   Platelets 150 - 400 K/uL 155  202  284       Latest Ref Rng & Units 12/06/2023    2:07 AM 12/05/2023    7:42 PM 11/20/2023    8:58 AM  BMP  Glucose 70 - 99 mg/dL 956  68  213   BUN 8 - 23 mg/dL 15  14  23    Creatinine 0.44 - 1.00 mg/dL 0.86  5.78  4.69   Sodium 135 - 145 mmol/L 139  142  141   Potassium 3.5 - 5.1 mmol/L 3.5  3.6  3.4   Chloride 98 - 111 mmol/L 105  103  101   CO2 22 - 32 mmol/L 21  24  29    Calcium  8.9 - 10.3 mg/dL 8.2  8.8  8.1    DG Chest Port 1 View Result Date: 12/05/2023 CLINICAL DATA:  Cough and shortness of breath with labored breathing. EXAM: PORTABLE CHEST 1 VIEW COMPARISON:  November 18, 2023 FINDINGS: The heart size and mediastinal contours are within normal limits. The lungs are hyperinflated. There is no evidence of an acute infiltrate, pleural effusion or pneumothorax. Radiopaque surgical clips are seen overlying the neck soft tissues. Chronic left-sided rib fractures are noted. The visualized skeletal structures are otherwise unremarkable. IMPRESSION: No active cardiopulmonary disease. Electronically Signed   By: Aram Candela M.D.   On: 12/05/2023 21:14   Family Communication: Discussed with patient, she understand and agree. All questions answereed.  Disposition: Status  is: Inpatient Remains inpatient appropriate because: COPD exacerbation, COVID  Planned Discharge Destination: Home with Home Health     Time spent: 39 minutes  Author: Marcelino Duster, MD 12/06/2023 12:57 PM Secure chat 7am to 7pm For on call review [email or web address].

## 2023-12-06 NOTE — Plan of Care (Signed)
  Problem: Education: Goal: Knowledge of risk factors and measures for prevention of condition will improve Outcome: Progressing   Problem: Coping: Goal: Psychosocial and spiritual needs will be supported Outcome: Progressing   Problem: Respiratory: Goal: Will maintain a patent airway Outcome: Progressing Goal: Complications related to the disease process, condition or treatment will be avoided or minimized Outcome: Progressing   Problem: Education: Goal: Knowledge of General Education information will improve Description: Including pain rating scale, medication(s)/side effects and non-pharmacologic comfort measures Outcome: Progressing   Problem: Health Behavior/Discharge Planning: Goal: Ability to manage health-related needs will improve Outcome: Progressing   Problem: Clinical Measurements: Goal: Ability to maintain clinical measurements within normal limits will improve Outcome: Progressing Goal: Will remain free from infection Outcome: Progressing Goal: Diagnostic test results will improve Outcome: Progressing Goal: Respiratory complications will improve Outcome: Progressing Goal: Cardiovascular complication will be avoided Outcome: Progressing   Problem: Activity: Goal: Risk for activity intolerance will decrease Outcome: Progressing   Problem: Nutrition: Goal: Adequate nutrition will be maintained Outcome: Progressing   Problem: Coping: Goal: Level of anxiety will decrease Outcome: Progressing   Problem: Elimination: Goal: Will not experience complications related to bowel motility Outcome: Progressing Goal: Will not experience complications related to urinary retention Outcome: Progressing   Problem: Pain Managment: Goal: General experience of comfort will improve and/or be controlled Outcome: Progressing   Problem: Safety: Goal: Ability to remain free from injury will improve Outcome: Progressing   Problem: Skin Integrity: Goal: Risk for impaired  skin integrity will decrease Outcome: Progressing   Problem: Education: Goal: Knowledge of disease or condition will improve Outcome: Progressing Goal: Knowledge of the prescribed therapeutic regimen will improve Outcome: Progressing Goal: Individualized Educational Video(s) Outcome: Progressing   Problem: Activity: Goal: Ability to tolerate increased activity will improve Outcome: Progressing Goal: Will verbalize the importance of balancing activity with adequate rest periods Outcome: Progressing   Problem: Respiratory: Goal: Ability to maintain a clear airway will improve Outcome: Progressing Goal: Levels of oxygenation will improve Outcome: Progressing Goal: Ability to maintain adequate ventilation will improve Outcome: Progressing

## 2023-12-07 DIAGNOSIS — J9601 Acute respiratory failure with hypoxia: Secondary | ICD-10-CM | POA: Diagnosis not present

## 2023-12-07 DIAGNOSIS — U071 COVID-19: Secondary | ICD-10-CM | POA: Diagnosis not present

## 2023-12-07 DIAGNOSIS — J441 Chronic obstructive pulmonary disease with (acute) exacerbation: Secondary | ICD-10-CM | POA: Diagnosis not present

## 2023-12-07 DIAGNOSIS — F32A Depression, unspecified: Secondary | ICD-10-CM

## 2023-12-07 DIAGNOSIS — I10 Essential (primary) hypertension: Secondary | ICD-10-CM | POA: Diagnosis not present

## 2023-12-07 DIAGNOSIS — F419 Anxiety disorder, unspecified: Secondary | ICD-10-CM

## 2023-12-07 MED ORDER — ALPRAZOLAM 0.25 MG PO TABS
0.2500 mg | ORAL_TABLET | Freq: Three times a day (TID) | ORAL | Status: DC | PRN
  Administered 2023-12-07 – 2023-12-08 (×3): 0.25 mg via ORAL
  Filled 2023-12-07 (×3): qty 1

## 2023-12-07 MED ORDER — IPRATROPIUM-ALBUTEROL 0.5-2.5 (3) MG/3ML IN SOLN
3.0000 mL | RESPIRATORY_TRACT | Status: DC | PRN
  Administered 2023-12-08 (×2): 3 mL via RESPIRATORY_TRACT
  Filled 2023-12-07 (×2): qty 3

## 2023-12-07 NOTE — Plan of Care (Signed)
  Problem: Clinical Measurements: Goal: Will remain free from infection Outcome: Progressing   Problem: Coping: Goal: Level of anxiety will decrease Outcome: Progressing   Problem: Elimination: Goal: Will not experience complications related to bowel motility Outcome: Progressing

## 2023-12-07 NOTE — Progress Notes (Signed)
 Progress Note   Patient: Cindy Gutierrez WUJ:[id number] DOB: April 23, 1961 DOA: 12/05/2023     2 DOS: the patient was seen and examined on 12/07/2023   Brief hospital course: [name] is a 63 year old female with history of depression, anxiety, neuropathy, hyperlipidemia, hypertension, history of COPD, who presents to the emergency department for chief concerns of shortness of breath for 2 days.  Patient is admitted for evaluation of acute hypoxic respiratory failure secondary to COPD exacerbation, tested positive for COVID.  Assessment and Plan: * COPD exacerbation (HCC) COVID infection Acute hypoxic respiratory failure (HCC) Continue oxygen supplementation to maintain SpO2 greater than 92% She is saturating well but feels anxious, will give small dose xanax tid prn. Continue DuoNebs q4 PRN. Continue Solu-Medrol 40 mg IV daily and plan to taper to oral in next 2 days.  Continue antitussive agents. Continue COVID isolation precautions. MRSA negative. Finish doxy for 3 days. Pulmonary clinic follow up after discharge will be arranged.  Essential hypertension Continue Amlodipine 10 mg daily, chlorthalidone 12.5 mg.  Dyslipidemia Continue Rosuvastatin 20 mg  Elevated troponin Suspect secondary to demand ischemia in setting of acute hypoxic respiratory failure secondary to COVID-19 infection Low clinical suspicion for ACS at this time    PRN anxiolytics ordered. Out of bed to chair. Incentive spirometry. Nursing supportive care. Fall, aspiration precautions. DVT prophylaxis   Code Status: Full Code Subjective: Patient is seen and examined today morning.  She is complaining feeling anxious, panicky, tearful.  Patient is currently on 2 L supplemental oxygen. RN at bedside reassuring her.  Physical Exam: Vitals:   12/06/23 1632 12/06/23 2302 12/07/23 0756 12/07/23 1435  BP: (!) 148/51 (!) 152/55 (!) 154/68 (!) 148/49  Pulse: 72 76 62 65  Resp: 17 18 17 16   Temp: 99.1 F (37.3  C) 98.2 F (36.8 C) 98.3 F (36.8 C) 98.4 F (36.9 C)  TempSrc:   Oral Oral  SpO2: 100% 100% 99% 100%  Weight:      Height:        General - Elderly can American female, anxious. HEENT - PERRLA, EOMI, atraumatic head, non tender sinuses. Lung - Clear, diffuse rhonchi, wheezes improved. Heart - S1, S2 heard, no murmurs, rubs, trace pedal edema. Abdomen - Soft, non tender, bowel sounds good Neuro - Alert, awake and oriented x 3, non focal exam. Skin - Warm and dry.  Data Reviewed:      Latest Ref Rng & Units 12/06/2023    2:07 AM 12/05/2023    7:42 PM 11/20/2023    8:58 AM  CBC  WBC 4.0 - 10.5 K/uL 6.6  9.6  10.7   Hemoglobin 12.0 - 15.0 g/dL 95.6  21.3  08.6   Hematocrit 36.0 - 46.0 % 34.3  37.1  33.6   Platelets 150 - 400 K/uL 155  202  284       Latest Ref Rng & Units 12/06/2023    2:07 AM 12/05/2023    7:42 PM 11/20/2023    8:58 AM  BMP  Glucose 70 - 99 mg/dL 578  68  469   BUN 8 - 23 mg/dL 15  14  23    Creatinine 0.44 - 1.00 mg/dL 6.29  5.28  4.13   Sodium 135 - 145 mmol/L 139  142  141   Potassium 3.5 - 5.1 mmol/L 3.5  3.6  3.4   Chloride 98 - 111 mmol/L 105  103  101   CO2 22 - 32 mmol/L 21  24  29   Calcium 8.9 - 10.3 mg/dL 8.2  8.8  8.1    DG Chest Port 1 View Result Date: 12/05/2023 CLINICAL DATA:  Cough and shortness of breath with labored breathing. EXAM: PORTABLE CHEST 1 VIEW COMPARISON:  November 18, 2023 FINDINGS: The heart size and mediastinal contours are within normal limits. The lungs are hyperinflated. There is no evidence of an acute infiltrate, pleural effusion or pneumothorax. Radiopaque surgical clips are seen overlying the neck soft tissues. Chronic left-sided rib fractures are noted. The visualized skeletal structures are otherwise unremarkable. IMPRESSION: No active cardiopulmonary disease. Electronically Signed   By: Aram Candela M.D.   On: 12/05/2023 21:14   Family Communication: Discussed with patient, she understand and agree. All questions  answereed.  Disposition: Status is: Inpatient Remains inpatient appropriate because: COPD exacerbation, COVID  Planned Discharge Destination: Home with Home Health     Time spent: 40 minutes  Author: Marcelino Duster, MD 12/07/2023 5:00 PM Secure chat 7am to 7pm For on call review [email or web address].

## 2023-12-07 NOTE — Plan of Care (Signed)
  Problem: Education: Goal: Knowledge of risk factors and measures for prevention of condition will improve Outcome: Progressing   Problem: Coping: Goal: Psychosocial and spiritual needs will be supported Outcome: Progressing   Problem: Respiratory: Goal: Will maintain a patent airway Outcome: Progressing   Problem: Clinical Measurements: Goal: Ability to maintain clinical measurements within normal limits will improve Outcome: Progressing   Problem: Pain Managment: Goal: General experience of comfort will improve and/or be controlled Outcome: Progressing   Problem: Safety: Goal: Ability to remain free from injury will improve Outcome: Progressing   Problem: Respiratory: Goal: Ability to maintain a clear airway will improve Outcome: Progressing

## 2023-12-08 ENCOUNTER — Other Ambulatory Visit: Payer: Self-pay

## 2023-12-08 DIAGNOSIS — J441 Chronic obstructive pulmonary disease with (acute) exacerbation: Secondary | ICD-10-CM | POA: Diagnosis not present

## 2023-12-08 DIAGNOSIS — F17211 Nicotine dependence, cigarettes, in remission: Secondary | ICD-10-CM

## 2023-12-08 DIAGNOSIS — U071 COVID-19: Secondary | ICD-10-CM | POA: Diagnosis not present

## 2023-12-08 DIAGNOSIS — J9601 Acute respiratory failure with hypoxia: Secondary | ICD-10-CM | POA: Diagnosis not present

## 2023-12-08 DIAGNOSIS — I10 Essential (primary) hypertension: Secondary | ICD-10-CM | POA: Diagnosis not present

## 2023-12-08 MED ORDER — OXYCODONE HCL 5 MG PO TABS
5.0000 mg | ORAL_TABLET | ORAL | Status: DC | PRN
  Administered 2023-12-08: 5 mg via ORAL
  Filled 2023-12-08: qty 1

## 2023-12-08 MED ORDER — PREDNISONE 20 MG PO TABS
40.0000 mg | ORAL_TABLET | Freq: Every day | ORAL | 0 refills | Status: AC
  Filled 2023-12-08: qty 6, 3d supply, fill #0

## 2023-12-08 NOTE — TOC Progression Note (Signed)
 Transition of Care [facility]) - Progression Note    Patient Details  Name: Cindy Gutierrez MRN: [id number] Date of Birth: [birth year]  [facility]) CM/SW Contact  Marlowe Sax, RN Phone Number: 12/08/2023, 12:06 PM  Clinical Narrative:    Spoke with the patient, she needs oxygen and [employer] will deliver to the bedside She does not have a preference for [facility] agencies, [facility] has accepted the referral She stated that her son's father will transport her home and is at the bedside   Expected Discharge Plan: Home w Home Health Services Barriers to Discharge: Continued Medical Work up  Expected Discharge Plan and Services   Discharge Planning Services: CM Consult   Living arrangements for the past 2 months: Single Family Home Expected Discharge Date: 12/08/23               DME Arranged: Oxygen DME Agency: [facility] Date DME Agency Contacted: 12/08/23 Time DME Agency Contacted: 1205 Representative spoke with at DME Agency: Cletis [place] HH Arranged: PT, OT [facility] Agency: [facility] Date HH Agency Contacted: 12/08/23 Time HH Agency Contacted: 1205 Representative spoke with at [facility] Agency: Renee Harder   Social Determinants of Health (SDOH) Interventions SDOH Screenings   Food Insecurity: No Food Insecurity (12/06/2023)  Recent Concern: Food Insecurity - Food Insecurity Present (09/19/2023)  Housing: Unknown (12/06/2023)  Transportation Needs: No Transportation Needs (12/06/2023)  Utilities: Not At Risk (12/06/2023)  Depression (PHQ2-9): High Risk (09/08/2022)  Tobacco Use: Medium Risk (12/05/2023)    Readmission Risk Interventions     No data to display

## 2023-12-08 NOTE — Progress Notes (Signed)
SATURATION QUALIFICATIONS: (This note is used to comply with regulatory documentation for home oxygen)  Patient Saturations on Room Air at Rest = 94%  Patient Saturations on Room Air while Ambulating = 86%  Patient Saturations on 2 Liters of oxygen while Ambulating = 93%  

## 2023-12-08 NOTE — Plan of Care (Signed)
  Problem: Clinical Measurements: Goal: Ability to maintain clinical measurements within normal limits will improve Outcome: Progressing Goal: Will remain free from infection Outcome: Progressing   Problem: Coping: Goal: Level of anxiety will decrease Outcome: Progressing   Problem: Elimination: Goal: Will not experience complications related to bowel motility Outcome: Progressing

## 2023-12-09 NOTE — Discharge Summary (Signed)
Physician Discharge Summary   Patient: Cindy Gutierrez MRN: [id number] DOB: 1961-02-05  Admit date:     12/05/2023  Discharge date: 12/09/23  Discharge Physician: Marcelino Duster   PCP: [facility]   Recommendations at discharge:    PCP follow up in 1 week. Pulmonary follow up as scheduled.  Discharge Diagnoses: Principal Problem:   COPD exacerbation (HCC) Active Problems:   Acute hypoxic respiratory failure (HCC)   Essential hypertension   Dyslipidemia   Depression   Primary hyperparathyroidism (HCC)   Nicotine dependence   COVID-19 virus infection   Cough   Elevated troponin  Resolved Problems:   COPD with acute exacerbation [facility])  Hospital Course: Cindy Gutierrez is a 63 year old female with history of depression, anxiety, neuropathy, hyperlipidemia, hypertension, history of COPD, who presents to the emergency department for chief concerns of shortness of breath for 2 days.   Patient is admitted for evaluation of acute hypoxic respiratory failure secondary to COPD exacerbation, tested positive for COVID.    Assessment and Plan: COPD exacerbation (HCC) COVID infection Acute hypoxic respiratory failure (HCC) Continue oxygen supplementation to maintain SpO2 greater than 92% Continued on DuoNebs q4 PRN. Solumedrol tapered to oral prednisone therapy.  She qualified for 2L supplemental O2 at home. TOC arranged O2. Continue antitussive agents. Finished doxy for 3 days. Pulmonary clinic follow up after discharge arranged.   Essential hypertension Continue Amlodipine 10 mg daily, chlorthalidone 12.5 mg.   Dyslipidemia Continue Rosuvastatin 20 mg   Elevated troponin Suspect secondary to demand ischemia in setting of acute hypoxic respiratory failure secondary to COVID-19 infection Low clinical suspicion for ACS at this time       Consultants: none Procedures performed: none  Disposition:  home with home o2 Diet recommendation:  Discharge Diet Orders  (From admission, onward)     Start     Ordered   12/08/23 0000  Diet - low sodium heart healthy        12/08/23 1101           Cardiac diet DISCHARGE MEDICATION: Allergies as of 12/08/2023       Reactions   Sulfa Antibiotics Hives, Rash   Bee Pollen Hives   Lisinopril Nausea Only   Losartan    Penicillins Hives   Penicillins    Sulfa Antibiotics    Tiotropium Bromide Monohydrate    Other reaction(s): Cough   Carvedilol         Medication List     TAKE these medications    acetaminophen 325 MG tablet Commonly known as: TYLENOL Take 650 mg by mouth 3 (three) times daily as needed for mild pain (pain score 1-3).   albuterol (2.5 MG/3ML) 0.083% nebulizer solution Commonly known as: PROVENTIL Take 2.5 mg by nebulization every 4 (four) hours as needed for wheezing or shortness of breath.   albuterol 108 (90 Base) MCG/ACT inhaler Commonly known as: VENTOLIN HFA Inhale 2 puffs into the lungs every 6 (six) hours as needed for wheezing or shortness of breath.   amLODipine 10 MG tablet Commonly known as: NORVASC Take 1 tablet (10 mg total) by mouth daily.   ascorbic acid 500 MG tablet Commonly known as: VITAMIN C Take 1 tablet (500 mg total) by mouth daily.   aspirin 81 MG chewable tablet Chew 81 mg by mouth daily.   B-12 1000 MCG Tabs Take 1 tablet (1,000 mcg total) by mouth daily.   budesonide-formoterol 160-4.5 MCG/ACT inhaler Commonly known as: SYMBICORT Inhale 2 puffs into the lungs 2 (  two) times daily.   calcium carbonate 500 MG chewable tablet Commonly known as: TUMS - dosed in mg elemental calcium Chew 1 tablet by mouth daily. As needed   calcium citrate 950 (200 Ca) MG tablet Commonly known as: CALCITRATE - dosed in mg elemental calcium Take 200 mg of elemental calcium by mouth daily.   celecoxib 200 MG capsule Commonly known as: CELEBREX Take 200 mg by mouth 2 (two) times daily.   chlorthalidone 25 MG tablet Commonly known as: HYGROTON Take  0.5 tablets (12.5 mg total) by mouth daily.   diclofenac Sodium 1 % Gel Commonly known as: VOLTAREN Apply 2 g topically 4 (four) times daily.   escitalopram 10 MG tablet Commonly known as: LEXAPRO Take 10 mg by mouth daily.   famotidine 20 MG tablet Commonly known as: PEPCID Take 20 mg by mouth 2 (two) times daily.   Ferrex 150 150 MG capsule Generic drug: iron polysaccharides Take 1 capsule (150 mg total) by mouth daily.   furosemide 20 MG tablet Commonly known as: LASIX Take 20 mg by mouth daily.   loratadine 10 MG tablet Commonly known as: Claritin Take 1 tablet (10 mg total) by mouth daily as needed for allergies or rhinitis.   Mavyret 100-40 MG Tabs Generic drug: Glecaprevir-Pibrentasvir Take 3 tablets by mouth daily.   predniSONE 20 MG tablet Commonly known as: DELTASONE Take 2 tablets (40 mg total) by mouth daily for 3 days.   pregabalin 150 MG capsule Commonly known as: LYRICA Take 150 mg by mouth 2 (two) times daily.   Robafen DM 20-200 MG/20ML Liqd Generic drug: Dextromethorphan-guaiFENesin Take 20 mLs by mouth every 8 (eight) hours as needed.   rosuvastatin 20 MG tablet Commonly known as: CRESTOR Take 20 mg by mouth daily.   Spiriva Respimat 2.5 MCG/ACT Aers Generic drug: Tiotropium Bromide Monohydrate Inhale 2 puffs into the lungs daily.   Vitamin D (Ergocalciferol) 1.25 MG (50000 UNIT) Caps capsule Commonly known as: DRISDOL Take 1 capsule (50,000 Units total) by mouth every 7 (seven) days.               Durable Medical Equipment  (From admission, onward)           Start     Ordered   12/08/23 0000  For home use only DME oxygen       Question Answer Comment  Length of Need 6 Months   Mode or (Route) Nasal cannula   Liters per Minute 2   Frequency Continuous (stationary and portable oxygen unit needed)   Oxygen conserving device Yes   Oxygen delivery system Gas      12/08/23 1101            Follow-up Information      Vida Rigger, MD Follow up.   Specialty: Pulmonary Disease Why: Severe COPD Needs refferal to be seen. Contact information: [address] [place] [address] [phone]         [facility] Follow up in 1 week(s).   Contact information: [address] [place] [address] [phone]                Discharge Exam: Ceasar Mons Weights   12/05/23 1835  Weight: 81 kg      12/08/2023    4:59 PM 12/08/2023   10:32 AM 12/08/2023    8:17 AM  Vitals with BMI  Systolic 164  158  Diastolic 54  50  Pulse 65 66 57   General - Elderly  can American female, anxious. HEENT - PERRLA, EOMI, atraumatic head, non tender sinuses. Lung - Clear, diffuse rhonchi, wheezes improved. Heart - S1, S2 heard, no murmurs, rubs, trace pedal edema. Abdomen - Soft, non tender, bowel sounds good Neuro - Alert, awake and oriented x 3, non focal exam. Skin - Warm and dry.  Condition at discharge: stable  The results of significant diagnostics from this hospitalization (including imaging, microbiology, ancillary and laboratory) are listed below for reference.   Imaging Studies: DG Chest Port 1 View Result Date: 12/05/2023 CLINICAL DATA:  Cough and shortness of breath with labored breathing. EXAM: PORTABLE CHEST 1 VIEW COMPARISON:  November 18, 2023 FINDINGS: The heart size and mediastinal contours are within normal limits. The lungs are hyperinflated. There is no evidence of an acute infiltrate, pleural effusion or pneumothorax. Radiopaque surgical clips are seen overlying the neck soft tissues. Chronic left-sided rib fractures are noted. The visualized skeletal structures are otherwise unremarkable. IMPRESSION: No active cardiopulmonary disease. Electronically Signed   By: Aram Candela M.D.   On: 12/05/2023 21:14   US Venous Img Lower Bilateral (DVT) Result Date: 11/18/2023 CLINICAL DATA:  Bilateral lower extremity edema. EXAM: BILATERAL LOWER EXTREMITY VENOUS DOPPLER ULTRASOUND  TECHNIQUE: Gray-scale sonography with compression, as well as color and duplex ultrasound, were performed to evaluate the deep venous system(s) from the level of the common femoral vein through the popliteal and proximal calf veins. COMPARISON:  None Available. FINDINGS: VENOUS Normal compressibility of the common femoral, superficial femoral, and popliteal veins, as well as the visualized calf veins. Visualized portions of profunda femoral vein and great saphenous vein unremarkable. No filling defects to suggest DVT on grayscale or color Doppler imaging. Doppler waveforms show normal direction of venous flow, normal respiratory plasticity and response to augmentation. OTHER None. Limitations: none IMPRESSION: *No acute deep vein thrombosis noted in the bilateral lower extremity. Electronically Signed   By: Jules Schick M.D.   On: 11/18/2023 15:07   DG Chest Port 1 View Result Date: 11/18/2023 CLINICAL DATA:  Shortness of breath. EXAM: PORTABLE CHEST 1 VIEW COMPARISON:  10/16/2023. FINDINGS: Redemonstration of small sub 5 mm heterogeneous alveolar opacities overlying the right upper mid lung zones, decreased in size and opacity when compared to the prior radiograph from 10/16/2023; which were new since the prior radiograph from 09/20/2023. Findings favor resolving infection/atypical pneumonia. Bilateral lung fields are otherwise clear. No acute consolidation or lung collapse. Bilateral costophrenic angles are clear. Normal cardio-mediastinal silhouette. No acute osseous abnormalities. The soft tissues are within normal limits. Redemonstration of surgical staples overlying the lower neck. IMPRESSION: Interval improvement in the alveolar opacities overlying the right upper mid lung zones, as described above. Electronically Signed   By: Jules Schick M.D.   On: 11/18/2023 11:13    Microbiology: Results for orders placed or performed during the hospital encounter of 12/05/23  Resp panel by RT-PCR (RSV, Flu  A&B, Covid) Anterior Nasal Swab     Status: Abnormal   Collection Time: 12/05/23  7:42 PM   Specimen: Anterior Nasal Swab  Result Value Ref Range Status   SARS Coronavirus 2 by RT PCR POSITIVE (A) NEGATIVE Final    Comment: (NOTE) SARS-CoV-2 target nucleic acids are DETECTED.  The SARS-CoV-2 RNA is generally detectable in upper respiratory specimens during the acute phase of infection. Positive results are indicative of the presence of the identified virus, but do not rule out bacterial infection or co-infection with other pathogens not detected by the test. Clinical correlation with patient  history and other diagnostic information is necessary to determine patient infection status. The expected result is Negative.  Fact Sheet for Patients: BloggerCourse.com  Fact Sheet for Healthcare Providers: SeriousBroker.it  This test is not yet approved or cleared by the [place] FDA and  has been authorized for detection and/or diagnosis of SARS-CoV-2 by FDA under an Emergency Use Authorization (EUA).  This EUA will remain in effect (meaning this test can be used) for the duration of  the COVID-19 declaration under Section 564(b)(1) of the A ct, 21 U.S.C. section 360bbb-3(b)(1), unless the authorization is terminated or revoked sooner.     Influenza A by PCR NEGATIVE NEGATIVE Final   Influenza B by PCR NEGATIVE NEGATIVE Final    Comment: (NOTE) The Xpert Xpress SARS-CoV-2/FLU/RSV plus assay is intended as an aid in the diagnosis of influenza from Nasopharyngeal swab specimens and should not be used as a sole basis for treatment. Nasal washings and aspirates are unacceptable for Xpert Xpress SARS-CoV-2/FLU/RSV testing.  Fact Sheet for Patients: BloggerCourse.com  Fact Sheet for Healthcare Providers: SeriousBroker.it  This test is not yet approved or cleared by the [place]  FDA and has been authorized for detection and/or diagnosis of SARS-CoV-2 by FDA under an Emergency Use Authorization (EUA). This EUA will remain in effect (meaning this test can be used) for the duration of the COVID-19 declaration under Section 564(b)(1) of the Act, 21 U.S.C. section 360bbb-3(b)(1), unless the authorization is terminated or revoked.     Resp Syncytial Virus by PCR NEGATIVE NEGATIVE Final    Comment: (NOTE) Fact Sheet for Patients: BloggerCourse.com  Fact Sheet for Healthcare Providers: SeriousBroker.it  This test is not yet approved or cleared by the [place] FDA and has been authorized for detection and/or diagnosis of SARS-CoV-2 by FDA under an Emergency Use Authorization (EUA). This EUA will remain in effect (meaning this test can be used) for the duration of the COVID-19 declaration under Section 564(b)(1) of the Act, 21 U.S.C. section 360bbb-3(b)(1), unless the authorization is terminated or revoked.  Performed at [facility], [address] [place], [place] [address]   MRSA Next Gen by PCR, Nasal     Status: None   Collection Time: 12/06/23  2:07 AM   Specimen: Nasal Mucosa; Nasal Swab  Result Value Ref Range Status   MRSA by PCR Next Gen NOT DETECTED NOT DETECTED Final    Comment: (NOTE) The GeneXpert MRSA Assay (FDA approved for NASAL specimens only), is one component of a comprehensive MRSA colonization surveillance program. It is not intended to diagnose MRSA infection nor to guide or monitor treatment for MRSA infections. Test performance is not FDA approved in patients less than 86 years old. Performed at [facility], [address] [place], [place] [address]     Labs: CBC: Recent Labs  Lab 12/05/23 1942 12/06/23 0207  WBC 9.6 6.6  HGB 11.5* 10.8*  HCT 37.1 34.3*  MCV 90.3 89.8  PLT 202 155   Basic Metabolic Panel: Recent Labs  Lab 12/05/23 1942  12/06/23 0207  NA 142 139  K 3.6 3.5  CL 103 105  CO2 24 21*  GLUCOSE 68* 175*  BUN 14 15  CREATININE 0.74 0.75  CALCIUM 8.8* 8.2*   Liver Function Tests: No results for input(s): "AST", "ALT", "ALKPHOS", "BILITOT", "PROT", "ALBUMIN" in the last 168 hours. CBG: No results for input(s): "GLUCAP" in the last 168 hours.  Discharge time spent: 35 minutes.  Signed: Marcelino Duster, MD [facility]  12/09/2023 

## 2024-04-14 ENCOUNTER — Other Ambulatory Visit: Payer: Self-pay

## 2024-04-14 ENCOUNTER — Encounter: Payer: Self-pay | Admitting: Emergency Medicine

## 2024-04-14 ENCOUNTER — Emergency Department

## 2024-04-14 ENCOUNTER — Inpatient Hospital Stay
Admission: EM | Admit: 2024-04-14 | Discharge: 2024-04-18 | DRG: 291 | Disposition: A | Discharge status: Home / Self Care | Attending: Internal Medicine | Admitting: Internal Medicine

## 2024-04-14 DIAGNOSIS — J441 Chronic obstructive pulmonary disease with (acute) exacerbation: Secondary | ICD-10-CM | POA: Diagnosis present

## 2024-04-14 DIAGNOSIS — Z7951 Long term (current) use of inhaled steroids: Secondary | ICD-10-CM

## 2024-04-14 DIAGNOSIS — I2510 Atherosclerotic heart disease of native coronary artery without angina pectoris: Secondary | ICD-10-CM | POA: Diagnosis present

## 2024-04-14 DIAGNOSIS — Z1152 Encounter for screening for COVID-19: Secondary | ICD-10-CM

## 2024-04-14 DIAGNOSIS — J9602 Acute respiratory failure with hypercapnia: Principal | ICD-10-CM | POA: Diagnosis present

## 2024-04-14 DIAGNOSIS — J9601 Acute respiratory failure with hypoxia: Principal | ICD-10-CM | POA: Diagnosis present

## 2024-04-14 DIAGNOSIS — I272 Pulmonary hypertension, unspecified: Secondary | ICD-10-CM | POA: Diagnosis present

## 2024-04-14 DIAGNOSIS — I340 Nonrheumatic mitral (valve) insufficiency: Secondary | ICD-10-CM | POA: Diagnosis present

## 2024-04-14 DIAGNOSIS — I5033 Acute on chronic diastolic (congestive) heart failure: Secondary | ICD-10-CM | POA: Diagnosis present

## 2024-04-14 DIAGNOSIS — Z87891 Personal history of nicotine dependence: Secondary | ICD-10-CM

## 2024-04-14 DIAGNOSIS — Z7982 Long term (current) use of aspirin: Secondary | ICD-10-CM

## 2024-04-14 DIAGNOSIS — Z8619 Personal history of other infectious and parasitic diseases: Secondary | ICD-10-CM

## 2024-04-14 DIAGNOSIS — Z77120 Contact with and (suspected) exposure to mold (toxic): Secondary | ICD-10-CM

## 2024-04-14 DIAGNOSIS — I160 Hypertensive urgency: Secondary | ICD-10-CM | POA: Diagnosis present

## 2024-04-14 DIAGNOSIS — D72829 Elevated white blood cell count, unspecified: Secondary | ICD-10-CM | POA: Diagnosis present

## 2024-04-14 DIAGNOSIS — Z79899 Other long term (current) drug therapy: Secondary | ICD-10-CM | POA: Diagnosis not present

## 2024-04-14 DIAGNOSIS — I110 Hypertensive heart disease with heart failure: Principal | ICD-10-CM | POA: Diagnosis present

## 2024-04-14 DIAGNOSIS — Z8249 Family history of ischemic heart disease and other diseases of the circulatory system: Secondary | ICD-10-CM | POA: Diagnosis not present

## 2024-04-14 DIAGNOSIS — G629 Polyneuropathy, unspecified: Secondary | ICD-10-CM | POA: Diagnosis present

## 2024-04-14 DIAGNOSIS — E663 Overweight: Secondary | ICD-10-CM | POA: Diagnosis present

## 2024-04-14 DIAGNOSIS — Z888 Allergy status to other drugs, medicaments and biological substances status: Secondary | ICD-10-CM

## 2024-04-14 DIAGNOSIS — Z820 Family history of epilepsy and other diseases of the nervous system: Secondary | ICD-10-CM | POA: Diagnosis not present

## 2024-04-14 DIAGNOSIS — E785 Hyperlipidemia, unspecified: Secondary | ICD-10-CM | POA: Diagnosis present

## 2024-04-14 DIAGNOSIS — Z5941 Food insecurity: Secondary | ICD-10-CM

## 2024-04-14 DIAGNOSIS — Z6827 Body mass index (BMI) 27.0-27.9, adult: Secondary | ICD-10-CM

## 2024-04-14 DIAGNOSIS — Z9981 Dependence on supplemental oxygen: Secondary | ICD-10-CM | POA: Diagnosis not present

## 2024-04-14 LAB — RESP PANEL BY RT-PCR (RSV, FLU A&B, COVID)  RVPGX2
Influenza A by PCR: NEGATIVE
Influenza B by PCR: NEGATIVE
Resp Syncytial Virus by PCR: NEGATIVE
SARS Coronavirus 2 by RT PCR: NEGATIVE

## 2024-04-14 LAB — CBC WITH DIFFERENTIAL/PLATELET
Abs Immature Granulocytes: 0.04 K/uL (ref 0.00–0.07)
Basophils Absolute: 0.2 K/uL — ABNORMAL HIGH (ref 0.0–0.1)
Basophils Relative: 1 %
Eosinophils Absolute: 0.5 K/uL (ref 0.0–0.5)
Eosinophils Relative: 4 %
HCT: 42.7 % (ref 36.0–46.0)
Hemoglobin: 13.1 g/dL (ref 12.0–15.0)
Immature Granulocytes: 0 %
Lymphocytes Relative: 34 %
Lymphs Abs: 4.2 K/uL — ABNORMAL HIGH (ref 0.7–4.0)
MCH: 27.3 pg (ref 26.0–34.0)
MCHC: 30.7 g/dL (ref 30.0–36.0)
MCV: 89 fL (ref 80.0–100.0)
Monocytes Absolute: 0.9 K/uL (ref 0.1–1.0)
Monocytes Relative: 7 %
Neutro Abs: 6.4 K/uL (ref 1.7–7.7)
Neutrophils Relative %: 54 %
Platelets: 305 K/uL (ref 150–400)
RBC: 4.8 MIL/uL (ref 3.87–5.11)
RDW: 14.3 % (ref 11.5–15.5)
WBC: 12.2 K/uL — ABNORMAL HIGH (ref 4.0–10.5)
nRBC: 0 % (ref 0.0–0.2)

## 2024-04-14 LAB — COMPREHENSIVE METABOLIC PANEL WITH GFR
ALT: 12 U/L (ref 0–44)
AST: 20 U/L (ref 15–41)
Albumin: 4.2 g/dL (ref 3.5–5.0)
Alkaline Phosphatase: 99 U/L (ref 38–126)
Anion gap: 6 (ref 5–15)
BUN: 17 mg/dL (ref 8–23)
CO2: 30 mmol/L (ref 22–32)
Calcium: 9 mg/dL (ref 8.9–10.3)
Chloride: 108 mmol/L (ref 98–111)
Creatinine, Ser: 0.63 mg/dL (ref 0.44–1.00)
GFR, Estimated: >60 mL/min (ref >60)
Glucose, Bld: 118 mg/dL — ABNORMAL HIGH (ref 70–99)
Potassium: 4.4 mmol/L (ref 3.5–5.1)
Sodium: 144 mmol/L (ref 135–145)
Total Bilirubin: 0.5 mg/dL (ref 0.0–1.2)
Total Protein: 7.1 g/dL (ref 6.5–8.1)

## 2024-04-14 LAB — BLOOD GAS, VENOUS
Acid-Base Excess: 3.6 mmol/L — ABNORMAL HIGH (ref 0.0–2.0)
Bicarbonate: 33.3 mmol/L — ABNORMAL HIGH (ref 20.0–28.0)
O2 Content: 15 L/min
O2 Saturation: 97.3 %
Patient temperature: 37
pCO2, Ven: 76 mmHg (ref 44–60)
pH, Ven: 7.25 (ref 7.25–7.43)
pO2, Ven: 86 mmHg — ABNORMAL HIGH (ref 32–45)

## 2024-04-14 LAB — BRAIN NATRIURETIC PEPTIDE: B Natriuretic Peptide: 720.4 pg/mL — ABNORMAL HIGH (ref 0.0–100.0)

## 2024-04-14 LAB — TROPONIN I (HIGH SENSITIVITY)
Troponin I (High Sensitivity): 18 ng/L — ABNORMAL HIGH (ref <18)
Troponin I (High Sensitivity): 71 ng/L — ABNORMAL HIGH (ref <18)

## 2024-04-14 LAB — PROCALCITONIN: Procalcitonin: 0.37 ng/mL

## 2024-04-14 MED ORDER — METHYLPREDNISOLONE SODIUM SUCC 125 MG IJ SOLR
125.0000 mg | INTRAMUSCULAR | Status: AC
  Administered 2024-04-14: 125 mg via INTRAVENOUS
  Filled 2024-04-14: qty 2

## 2024-04-14 MED ORDER — MAGNESIUM SULFATE 2 GM/50ML IV SOLN
2.0000 g | INTRAVENOUS | Status: AC
  Administered 2024-04-14: 2 g via INTRAVENOUS
  Filled 2024-04-14: qty 50

## 2024-04-14 MED ORDER — FUROSEMIDE 10 MG/ML IJ SOLN
40.0000 mg | Freq: Once | INTRAMUSCULAR | Status: AC
  Administered 2024-04-14: 40 mg via INTRAVENOUS
  Filled 2024-04-14: qty 4

## 2024-04-14 MED ORDER — ENALAPRILAT 1.25 MG/ML IV SOLN: 1.2500 mg | Freq: Once | INTRAVENOUS | Status: DC

## 2024-04-14 MED ORDER — LORAZEPAM 2 MG/ML IJ SOLN
1.0000 mg | Freq: Once | INTRAMUSCULAR | Status: AC
  Administered 2024-04-14: 1 mg via INTRAVENOUS
  Filled 2024-04-14: qty 1

## 2024-04-14 MED ORDER — ALBUTEROL SULFATE (2.5 MG/3ML) 0.083% IN NEBU
5.0000 mg | INHALATION_SOLUTION | Freq: Once | RESPIRATORY_TRACT | Status: AC
  Administered 2024-04-14: 5 mg via RESPIRATORY_TRACT
  Filled 2024-04-14: qty 6

## 2024-04-14 NOTE — Progress Notes (Signed)
 Patient trialed off of Bipap at this time, placed on 4L [place]. SAT 98%, no distress noted

## 2024-04-14 NOTE — ED Triage Notes (Signed)
 [name] from home where she called EMS with sudden onset of shortness of breath. Patient normally wears 2 L of O2 chronically. EMS stated her Sp02 was not good on room air. Did not specify Spo2. Hypertensive with EMS 200/98. Diminished lung sounds.

## 2024-04-14 NOTE — ED Provider Notes (Signed)
 [facility] Provider Note    Event Date/Time   First MD Initiated Contact with Patient 04/14/24 2000     (approximate)   History   Chief Complaint: Respiratory Distress   HPI  Cindy Gutierrez is a 63 y.o. female with a history of CHF COPD who comes ED complaining of sudden onset of shortness of breath that started this evening.  Denies chest pain or fever.  No cough.  Patient was not able to tolerate CPAP with EMS.  Would not allow IV.  They gave bronchodilators and transported patient on nonrebreather.        Past Medical History:  Diagnosis Date   Aortic valve regurgitation    CHF (congestive heart failure) (HCC)    COPD (chronic obstructive pulmonary disease) (HCC)    Coronary artery disease    Depression    Hepatitis C    Hyperparathyroidism (HCC)    Hypertension    Mitral valve regurgitation    Neuropathy    to R arm only   Non-toxic multinodular goiter    Pulmonary hypertension (HCC) 2022   noted on CT   Pulmonary nodule    Thyroid  disease     Current Outpatient Rx   Order #: [id number] Class: Historical Med   Order #: [id number] Class: Historical Med   Order #: [id number] Class: Normal   Order #: [id number] Class: Normal   Order #: [id number] Class: Historical Med   Order #: [id number] Class: Historical Med   Order #: [id number] Class: Historical Med   Order #: [id number] Class: Historical Med   Order #: [id number] Class: Historical Med   Order #: [id number] Class: Historical Med   Order #: [id number] Class: Historical Med   Order #: [id number] Class: Normal   Order #: [id number] Class: Normal   Order #: [id number] Class: Historical Med   Order #: [id number] Class: Historical Med   Order #: [id number] Class: Historical Med   Order #: [id number] Class: Historical Med   Order #: [id number] Class: Historical Med   Order #: [id number] Class: Normal   Order #: [id number] Class: Normal   Order #: [id number] Class: Historical Med    Past Surgical History:  Procedure  Laterality Date   COLONOSCOPY WITH PROPOFOL  N/A 08/21/2018   Procedure: COLONOSCOPY WITH PROPOFOL ;  Surgeon: Unk Corinn Skiff, MD;  Location: [facility] ENDOSCOPY;  Service: Gastroenterology;  Laterality: N/A;   PARATHYROIDECTOMY Left    TUBAL LIGATION      Physical Exam   Triage Vital Signs: ED Triage Vitals  Encounter Vitals Group     BP      Girls Systolic BP Percentile      Girls Diastolic BP Percentile      Boys Systolic BP Percentile      Boys Diastolic BP Percentile      Pulse      Resp      Temp      Temp src      SpO2      Weight      Height      Head Circumference      Peak Flow      Pain Score      Pain Loc      Pain Education      Exclude from Growth Chart     Most recent vital signs: Vitals:   04/14/24 2200 04/14/24 2215  BP: (!) 142/73   Pulse: 90 88  Resp: 20 20  Temp:    SpO2: 97% 98%    General: Awake, respiratory distress CV:  Good peripheral perfusion.  Tachycardia heart rate  130, normal peripheral pulses Resp:  Tachypnea.  Accessory muscle use.  Very diminished breath sounds in all lung fields. Abd:  No distention.  Soft nontender Other:  No lower extremity edema   ED Results / Procedures / Treatments   Labs (all labs ordered are listed, but only abnormal results are displayed) Labs Reviewed  COMPREHENSIVE METABOLIC PANEL WITH GFR - Abnormal; Notable for the following components:      Result Value   Glucose, Bld 118 (*)    All other components within normal limits  BRAIN NATRIURETIC PEPTIDE - Abnormal; Notable for the following components:   B Natriuretic Peptide 720.4 (*)    All other components within normal limits  CBC WITH DIFFERENTIAL/PLATELET - Abnormal; Notable for the following components:   WBC 12.2 (*)    Lymphs Abs 4.2 (*)    Basophils Absolute 0.2 (*)    All other components within normal limits  BLOOD GAS, VENOUS - Abnormal; Notable for the following components:   pCO2, Ven 76 (*)    pO2, Ven 86 (*)    Bicarbonate 33.3  (*)    Acid-Base Excess 3.6 (*)    All other components within normal limits  TROPONIN I (HIGH SENSITIVITY) - Abnormal; Notable for the following components:   Troponin I (High Sensitivity) 18 (*)    All other components within normal limits  TROPONIN I (HIGH SENSITIVITY) - Abnormal; Notable for the following components:   Troponin I (High Sensitivity) 71 (*)    All other components within normal limits  RESP PANEL BY RT-PCR (RSV, FLU A&B, COVID)  [id number]  PROCALCITONIN     EKG Interpreted by me Sinus tachycardia rate 110.  Left axis, normal intervals.  Poor R wave progression.  No acute ischemic changes.   RADIOLOGY Chest x-ray interpreted by me, shows hazy opacity of bilateral lower lungs.  Radiology report reviewed   PROCEDURES:  .Critical Care  Performed by: Viviann Pastor, MD Authorized by: Viviann Pastor, MD   Critical care provider statement:    Critical care time (minutes):  35   Critical care time was exclusive of:  Separately billable procedures and treating other patients   Critical care was necessary to treat or prevent imminent or life-threatening deterioration of the following conditions:  Respiratory failure   Critical care was time spent personally by me on the following activities:  Development of treatment plan with patient or surrogate, discussions with consultants, evaluation of patient's response to treatment, examination of patient, obtaining history from patient or surrogate, ordering and performing treatments and interventions, ordering and review of laboratory studies, ordering and review of radiographic studies, pulse oximetry, re-evaluation of patient's condition and review of old charts   Care discussed with: admitting provider      MEDICATIONS ORDERED IN ED: Medications  enalaprilat  (VASOTEC) injection 1.25 mg (0 mg Intravenous Hold 04/14/24 2104)  magnesium  sulfate IVPB 2 g 50 mL (0 g Intravenous Stopped 04/14/24 2122)  methylPREDNISolone   sodium succinate (SOLU-MEDROL ) 125 mg/2 mL injection 125 mg (125 mg Intravenous Given 04/14/24 2009)  albuterol  (PROVENTIL ) (2.5 MG/3ML) 0.083% nebulizer solution 5 mg (5 mg Nebulization Given by Other 04/14/24 2043)  LORazepam  (ATIVAN ) injection 1 mg (1 mg Intravenous Given 04/14/24 2007)  furosemide  (LASIX ) injection 40 mg (40 mg Intravenous Given 04/14/24 2050)     IMPRESSION / MDM / ASSESSMENT AND PLAN / ED COURSE  I reviewed the triage vital signs and the nursing notes.  DDx: COPD exacerbation, pneumothorax, pleural effusion, pulmonary edema, non-STEMI, COVID,  influenza.  Doubt PE, dissection, pericardial effusion.  Patient's presentation is most consistent with acute presentation with potential threat to life or bodily function.  Patient presents in respiratory distress.  BiPAP initiated on arrival.  Patient given Ativan  2 help facilitate BiPAP use.  Solu-Medrol  bronchodilators magnesium  given for her COPD.  Lasix  given for suspected CHF and pulmonary edema.  BNP is elevated, troponin slightly elevated, uptrending.  ----------------------------------------- 11:23 PM on 04/14/2024 ----------------------------------------- Improved on BiPAP.  On 30% FiO2, stable.  Will contact hospitalist.       FINAL CLINICAL IMPRESSION(S) / ED DIAGNOSES   Final diagnoses:  Acute respiratory failure with hypoxia and hypercapnia (HCC)     Rx / DC Orders   ED Discharge Orders     None        Note:  This document was prepared using Dragon voice recognition software and may include unintentional dictation errors.   Viviann Pastor, MD 04/14/24 [phone]

## 2024-04-15 ENCOUNTER — Telehealth (HOSPITAL_COMMUNITY): Payer: Self-pay | Admitting: Pharmacy Technician

## 2024-04-15 ENCOUNTER — Other Ambulatory Visit (HOSPITAL_COMMUNITY): Payer: Self-pay

## 2024-04-15 DIAGNOSIS — I5033 Acute on chronic diastolic (congestive) heart failure: Secondary | ICD-10-CM

## 2024-04-15 DIAGNOSIS — J9601 Acute respiratory failure with hypoxia: Secondary | ICD-10-CM | POA: Diagnosis not present

## 2024-04-15 DIAGNOSIS — I160 Hypertensive urgency: Secondary | ICD-10-CM

## 2024-04-15 DIAGNOSIS — J9602 Acute respiratory failure with hypercapnia: Secondary | ICD-10-CM | POA: Diagnosis not present

## 2024-04-15 LAB — BASIC METABOLIC PANEL WITH GFR
Anion gap: 10 (ref 5–15)
BUN: 16 mg/dL (ref 8–23)
CO2: 29 mmol/L (ref 22–32)
Calcium: 8.6 mg/dL — ABNORMAL LOW (ref 8.9–10.3)
Chloride: 104 mmol/L (ref 98–111)
Creatinine, Ser: 0.69 mg/dL (ref 0.44–1.00)
GFR, Estimated: >60 mL/min (ref >60)
Glucose, Bld: 231 mg/dL — ABNORMAL HIGH (ref 70–99)
Potassium: 4.2 mmol/L (ref 3.5–5.1)
Sodium: 143 mmol/L (ref 135–145)

## 2024-04-15 LAB — CBC
HCT: 38.2 % (ref 36.0–46.0)
Hemoglobin: 12.3 g/dL (ref 12.0–15.0)
MCH: 28 pg (ref 26.0–34.0)
MCHC: 32.2 g/dL (ref 30.0–36.0)
MCV: 87 fL (ref 80.0–100.0)
Platelets: 223 K/uL (ref 150–400)
RBC: 4.39 MIL/uL (ref 3.87–5.11)
RDW: 14.4 % (ref 11.5–15.5)
WBC: 6.7 K/uL (ref 4.0–10.5)
nRBC: 0 % (ref 0.0–0.2)

## 2024-04-15 MED ORDER — DAPAGLIFLOZIN PROPANEDIOL 10 MG PO TABS
10.0000 mg | ORAL_TABLET | Freq: Every day | ORAL | Status: DC
  Administered 2024-04-15 – 2024-04-18 (×4): 10 mg via ORAL
  Filled 2024-04-15 (×4): qty 1

## 2024-04-15 MED ORDER — PREGABALIN 75 MG PO CAPS
150.0000 mg | ORAL_CAPSULE | Freq: Two times a day (BID) | ORAL | Status: DC
  Administered 2024-04-15 – 2024-04-18 (×8): 150 mg via ORAL
  Filled 2024-04-15 (×7): qty 2
  Filled 2024-04-15: qty 3

## 2024-04-15 MED ORDER — ACETAMINOPHEN 325 MG PO TABS
650.0000 mg | ORAL_TABLET | Freq: Four times a day (QID) | ORAL | Status: DC | PRN
  Administered 2024-04-15 – 2024-04-18 (×8): 650 mg via ORAL
  Filled 2024-04-15 (×8): qty 2

## 2024-04-15 MED ORDER — ALBUTEROL SULFATE (2.5 MG/3ML) 0.083% IN NEBU
2.5000 mg | INHALATION_SOLUTION | RESPIRATORY_TRACT | Status: DC | PRN
  Administered 2024-04-15 – 2024-04-18 (×4): 2.5 mg via RESPIRATORY_TRACT
  Filled 2024-04-15 (×4): qty 3

## 2024-04-15 MED ORDER — ONDANSETRON HCL 4 MG/2ML IJ SOLN: 4.0000 mg | Freq: Four times a day (QID) | INTRAMUSCULAR | Status: DC | PRN

## 2024-04-15 MED ORDER — HYDRALAZINE HCL 20 MG/ML IJ SOLN
10.0000 mg | Freq: Four times a day (QID) | INTRAMUSCULAR | Status: DC | PRN
  Administered 2024-04-15: 10 mg via INTRAVENOUS
  Filled 2024-04-15: qty 1

## 2024-04-15 MED ORDER — SPIRONOLACTONE 25 MG PO TABS
25.0000 mg | ORAL_TABLET | Freq: Every day | ORAL | Status: DC
  Administered 2024-04-15 – 2024-04-18 (×4): 25 mg via ORAL
  Filled 2024-04-15 (×4): qty 1

## 2024-04-15 MED ORDER — ARFORMOTEROL TARTRATE 15 MCG/2ML IN NEBU
15.0000 ug | INHALATION_SOLUTION | Freq: Two times a day (BID) | RESPIRATORY_TRACT | Status: DC
  Administered 2024-04-15 – 2024-04-18 (×6): 15 ug via RESPIRATORY_TRACT
  Filled 2024-04-15 (×8): qty 2

## 2024-04-15 MED ORDER — PREDNISONE 20 MG PO TABS: 40.0000 mg | ORAL_TABLET | Freq: Every day | ORAL | Status: DC

## 2024-04-15 MED ORDER — ONDANSETRON HCL 4 MG PO TABS: 4.0000 mg | ORAL_TABLET | Freq: Four times a day (QID) | ORAL | Status: DC | PRN

## 2024-04-15 MED ORDER — IPRATROPIUM-ALBUTEROL 0.5-2.5 (3) MG/3ML IN SOLN
3.0000 mL | Freq: Four times a day (QID) | RESPIRATORY_TRACT | Status: DC
  Administered 2024-04-15 – 2024-04-16 (×5): 3 mL via RESPIRATORY_TRACT
  Filled 2024-04-15 (×5): qty 3

## 2024-04-15 MED ORDER — CALCIUM CARBONATE ANTACID 500 MG PO CHEW
1.0000 | CHEWABLE_TABLET | Freq: Every day | ORAL | Status: DC
  Administered 2024-04-15 – 2024-04-18 (×4): 200 mg via ORAL
  Filled 2024-04-15 (×4): qty 1

## 2024-04-15 MED ORDER — MAGNESIUM HYDROXIDE 400 MG/5ML PO SUSP: 30.0000 mL | Freq: Every day | ORAL | Status: DC | PRN

## 2024-04-15 MED ORDER — ACETAMINOPHEN 650 MG RE SUPP: 650.0000 mg | Freq: Four times a day (QID) | RECTAL | Status: DC | PRN

## 2024-04-15 MED ORDER — SODIUM CHLORIDE 0.9 % IV SOLN: 1.0000 g | INTRAVENOUS | Status: DC

## 2024-04-15 MED ORDER — AMLODIPINE BESYLATE 5 MG PO TABS
5.0000 mg | ORAL_TABLET | Freq: Every day | ORAL | Status: DC
  Administered 2024-04-15 – 2024-04-18 (×4): 5 mg via ORAL
  Filled 2024-04-15 (×4): qty 1

## 2024-04-15 MED ORDER — IPRATROPIUM-ALBUTEROL 0.5-2.5 (3) MG/3ML IN SOLN: 3.0000 mL | Freq: Four times a day (QID) | RESPIRATORY_TRACT | Status: DC

## 2024-04-15 MED ORDER — CELECOXIB 200 MG PO CAPS
200.0000 mg | ORAL_CAPSULE | Freq: Two times a day (BID) | ORAL | Status: DC
  Administered 2024-04-16 – 2024-04-18 (×6): 200 mg via ORAL
  Filled 2024-04-15 (×6): qty 1

## 2024-04-15 MED ORDER — ENOXAPARIN SODIUM 40 MG/0.4ML IJ SOSY
40.0000 mg | PREFILLED_SYRINGE | INTRAMUSCULAR | Status: DC
  Administered 2024-04-15 – 2024-04-16 (×2): 40 mg via SUBCUTANEOUS
  Filled 2024-04-15 (×4): qty 0.4

## 2024-04-15 MED ORDER — METHYLPREDNISOLONE SODIUM SUCC 40 MG IJ SOLR
40.0000 mg | Freq: Two times a day (BID) | INTRAMUSCULAR | Status: DC
  Administered 2024-04-15 – 2024-04-16 (×3): 40 mg via INTRAVENOUS
  Filled 2024-04-15 (×3): qty 1

## 2024-04-15 MED ORDER — FUROSEMIDE 10 MG/ML IJ SOLN
40.0000 mg | Freq: Two times a day (BID) | INTRAMUSCULAR | Status: DC
  Administered 2024-04-15 – 2024-04-16 (×3): 40 mg via INTRAVENOUS
  Filled 2024-04-15 (×3): qty 4

## 2024-04-15 MED ORDER — AMLODIPINE BESYLATE 5 MG PO TABS: 10.0000 mg | ORAL_TABLET | Freq: Every day | ORAL | Status: DC

## 2024-04-15 MED ORDER — TRAZODONE HCL 50 MG PO TABS
25.0000 mg | ORAL_TABLET | Freq: Every evening | ORAL | Status: DC | PRN
Start: 2024-04-15 — End: 2024-04-18
  Administered 2024-04-16 – 2024-04-17 (×2): 25 mg via ORAL
  Filled 2024-04-15 (×2): qty 1

## 2024-04-15 MED ORDER — ROSUVASTATIN CALCIUM 20 MG PO TABS
20.0000 mg | ORAL_TABLET | Freq: Every day | ORAL | Status: DC
  Administered 2024-04-15 – 2024-04-18 (×4): 20 mg via ORAL
  Filled 2024-04-15 (×4): qty 1

## 2024-04-15 MED ORDER — METHYLPREDNISOLONE SODIUM SUCC 40 MG IJ SOLR: 40.0000 mg | Freq: Two times a day (BID) | INTRAMUSCULAR | Status: DC

## 2024-04-15 MED ORDER — ALBUTEROL SULFATE (2.5 MG/3ML) 0.083% IN NEBU: 2.5000 mg | INHALATION_SOLUTION | Freq: Four times a day (QID) | RESPIRATORY_TRACT | Status: DC

## 2024-04-15 MED ORDER — SODIUM CHLORIDE 0.9 % IV SOLN
1.0000 g | INTRAVENOUS | Status: DC
  Administered 2024-04-15 – 2024-04-17 (×4): 1 g via INTRAVENOUS
  Filled 2024-04-15 (×4): qty 10

## 2024-04-15 NOTE — Telephone Encounter (Signed)
 Pharmacy Patient Advocate Encounter   Received notification from Inpatient Request that prior authorization for Farxiga  10MG  tablets is required/requested.   Insurance verification completed.   The patient is insured through [facility] MEDICAID .   Per test claim: PA required; PA submitted to above mentioned insurance via CoverMyMeds Key/confirmation #/EOC [id number] Status is pending

## 2024-04-15 NOTE — ED Notes (Signed)
 Provided snack tray and ginger ale

## 2024-04-15 NOTE — Assessment & Plan Note (Signed)
 On Crestor

## 2024-04-15 NOTE — Assessment & Plan Note (Signed)
Will continue Lyrica.

## 2024-04-15 NOTE — Progress Notes (Signed)
 Updated son Cindy Gutierrez on patient's current condition.

## 2024-04-15 NOTE — Plan of Care (Signed)
  Problem: Health Behavior/Discharge Planning: Goal: Ability to manage health-related needs will improve Outcome: Progressing   Problem: Clinical Measurements: Goal: Ability to maintain clinical measurements within normal limits will improve Outcome: Progressing Goal: Respiratory complications will improve Outcome: Progressing   Problem: Nutrition: Goal: Adequate nutrition will be maintained Outcome: Progressing   

## 2024-04-15 NOTE — Progress Notes (Signed)
 PROGRESS NOTE    Cindy Gutierrez  FMW:[id number] DOB: 1960/10/26 DOA: 04/14/2024 [facility]CP[facility]    Brief Narrative:  63 y.o. female with medical history significant for COPD, CHF, coronary artery disease, depression, hepatitis C, hyperparathyroidism, hypertension, mitral valve regurgitation, and pulmonary hypertension, who presented to the emergency room with acute onset of acute respiratory distress with worsening dyspnea that started this evening.  She has not been able to tolerate CPAP that was placed by EMS.  She has associated cough or wheezing.  No fever or chills.  No nausea or vomiting or abdominal pain.  No dysuria, oliguria or hematuria or flank pain.    Assessment & Plan:   Principal Problem:   Acute respiratory failure with hypoxia and hypercarbia (HCC) Active Problems:   COPD exacerbation (HCC)   COPD with acute exacerbation (HCC)   Hypertensive urgency   Dyslipidemia   Peripheral neuropathy   Acute on chronic diastolic CHF (congestive heart failure) (HCC)   * Acute respiratory failure with hypoxia and hypercarbia (HCC) - This is likely multifactorial due to acute on chronic diastolic CHF and COPD exacerbation. I suspect that COPD is large and driving force to hypoxia rather than congestive heart failure though patient is in a fluid overloaded state. Plan: IV steroids IV Lasix  aggressive bronchodilator Strict I's and outs Daily weights Cardiology consulted on admission     COPD with acute exacerbation (HCC) See above     Hypertensive urgency Improved.  Continue antihypertensives and IV Lasix   Dyslipidemia Statin   Peripheral neuropathy Lyrica      DVT prophylaxis: SQ Lovenox  Code Status: Full Family Communication: None today Disposition Plan: Status is: Inpatient Remains inpatient appropriate because: Respiratory failure, decompensated COPD, decompensated heart failure   Level of care: Telemetry Medical  Consultants:   Cardiology-[facility]  Procedures:  None  Antimicrobials: None   Subjective: Seen and examined.  Resting in bed.  Reports improvement in shortness of breath.  Objective: Vitals:   04/15/24 1150 04/15/24 1345 04/15/24 1606 04/15/24 1726  BP:  (!) 160/54 (!) 177/53 (!) 152/49  Pulse:  81 79 83  Resp:  17 20   Temp: 98 F (36.7 C)  98.4 F (36.9 C)   TempSrc:   Oral   SpO2:  98% 97%   Weight:      Height:        Intake/Output Summary (Last 24 hours) at 04/15/2024 1802 Last data filed at 04/15/2024 0118 Gross per 24 hour  Intake 100 ml  Output 900 ml  Net -800 ml   Filed Weights   04/14/24 2002  Weight: 81 kg    Examination:  General exam: Appears calm and comfortable  Respiratory system: Diminished breath sounds bilaterally.  Normal work of breathing.  3 L Cardiovascular system: S1-S2, RRR, no murmurs, 1+ pedal edema Gastrointestinal system: Soft, NT/ND, normal bowel sounds Central nervous system: Alert and oriented. No focal neurological deficits. Extremities: Symmetric 5 x 5 power. Skin: No rashes, lesions or ulcers Psychiatry: Judgement and insight appear normal. Mood & affect appropriate.     Data Reviewed: I have personally reviewed following labs and imaging studies  CBC: Recent Labs  Lab 04/14/24 2005 04/15/24 0622  WBC 12.2* 6.7  NEUTROABS 6.4  --   HGB 13.1 12.3  HCT 42.7 38.2  MCV 89.0 87.0  PLT 305 223   Basic Metabolic Panel: Recent Labs  Lab 04/14/24 2005 04/15/24 0622  NA 144 143  K 4.4 4.2  CL 108 104  CO2 30  29  GLUCOSE 118* 231*  BUN 17 16  CREATININE 0.63 0.69  CALCIUM  9.0 8.6*   GFR: Estimated Creatinine Clearance: 79.3 mL/min (by C-G formula based on SCr of 0.69 mg/dL). Liver Function Tests: Recent Labs  Lab 04/14/24 2005  AST 20  ALT 12  ALKPHOS 99  BILITOT 0.5  PROT 7.1  ALBUMIN 4.2   No results for input(s): LIPASE, AMYLASE in the last 168 hours. No results for input(s): AMMONIA in the last 168  hours. Coagulation Profile: No results for input(s): INR, PROTIME in the last 168 hours. Cardiac Enzymes: No results for input(s): CKTOTAL, CKMB, CKMBINDEX, TROPONINI in the last 168 hours. BNP (last 3 results) No results for input(s): PROBNP in the last 8760 hours. HbA1C: No results for input(s): HGBA1C in the last 72 hours. CBG: No results for input(s): GLUCAP in the last 168 hours. Lipid Profile: No results for input(s): CHOL, HDL, LDLCALC, TRIG, CHOLHDL, LDLDIRECT in the last 72 hours. Thyroid  Function Tests: No results for input(s): TSH, T4TOTAL, FREET4, T3FREE, THYROIDAB in the last 72 hours. Anemia Panel: No results for input(s): VITAMINB12, FOLATE, FERRITIN, TIBC, IRON , RETICCTPCT in the last 72 hours. Sepsis Labs: Recent Labs  Lab 04/14/24 2209  PROCALCITON 0.37    Recent Results (from the past 240 hours)  Resp panel by RT-PCR (RSV, Flu A&B, Covid) Anterior Nasal Swab     Status: None   Collection Time: 04/14/24  8:04 PM   Specimen: Anterior Nasal Swab  Result Value Ref Range Status   SARS Coronavirus 2 by RT PCR NEGATIVE NEGATIVE Final    Comment: (NOTE) SARS-CoV-2 target nucleic acids are NOT DETECTED.  The SARS-CoV-2 RNA is generally detectable in upper respiratory specimens during the acute phase of infection. The lowest concentration of SARS-CoV-2 viral copies this assay can detect is 138 copies/mL. A negative result does not preclude SARS-Cov-2 infection and should not be used as the sole basis for treatment or other patient management decisions. A negative result may occur with  improper specimen collection/handling, submission of specimen other than nasopharyngeal swab, presence of viral mutation(s) within the areas targeted by this assay, and inadequate number of viral copies(<138 copies/mL). A negative result must be combined with clinical observations, patient history, and epidemiological information.  The expected result is Negative.  Fact Sheet for Patients:  BloggerCourse.com  Fact Sheet for Healthcare Providers:  SeriousBroker.it  This test is no t yet approved or cleared by the [place]  FDA and  has been authorized for detection and/or diagnosis of SARS-CoV-2 by FDA under an Emergency Use Authorization (EUA). This EUA will remain  in effect (meaning this test can be used) for the duration of the COVID-19 declaration under Section 564(b)(1) of the Act, 21 U.S.C.section 360bbb-3(b)(1), unless the authorization is terminated  or revoked sooner.       Influenza A by PCR NEGATIVE NEGATIVE Final   Influenza B by PCR NEGATIVE NEGATIVE Final    Comment: (NOTE) The Xpert Xpress SARS-CoV-2/FLU/RSV plus assay is intended as an aid in the diagnosis of influenza from Nasopharyngeal swab specimens and should not be used as a sole basis for treatment. Nasal washings and aspirates are unacceptable for Xpert Xpress SARS-CoV-2/FLU/RSV testing.  Fact Sheet for Patients: BloggerCourse.com  Fact Sheet for Healthcare Providers: SeriousBroker.it  This test is not yet approved or cleared by the [place]  FDA and has been authorized for detection and/or diagnosis of SARS-CoV-2 by FDA under an Emergency Use Authorization (EUA). This EUA will remain in effect (meaning  this test can be used) for the duration of the COVID-19 declaration under Section 564(b)(1) of the Act, 21 U.S.C. section 360bbb-3(b)(1), unless the authorization is terminated or revoked.     Resp Syncytial Virus by PCR NEGATIVE NEGATIVE Final    Comment: (NOTE) Fact Sheet for Patients: BloggerCourse.com  Fact Sheet for Healthcare Providers: SeriousBroker.it  This test is not yet approved or cleared by the [place]  FDA and has been authorized for detection and/or  diagnosis of SARS-CoV-2 by FDA under an Emergency Use Authorization (EUA). This EUA will remain in effect (meaning this test can be used) for the duration of the COVID-19 declaration under Section 564(b)(1) of the Act, 21 U.S.C. section 360bbb-3(b)(1), unless the authorization is terminated or revoked.  Performed at [facility], [address] [place], [place] [address]          Radiology Studies: DG Chest Portable 1 View Result Date: 04/14/2024 CLINICAL DATA:  Shortness of breath EXAM: PORTABLE CHEST 1 VIEW COMPARISON:  12/05/2023 FINDINGS: The heart size and mediastinal contours are within normal limits. Diffuse interstitial opacity superimposed upon emphysema. The visualized skeletal structures are unremarkable. IMPRESSION: Diffuse interstitial opacity superimposed upon emphysema, consistent with edema or atypical/viral infection. No focal airspace opacity. Electronically Signed   By: Marolyn JONETTA Jaksch M.D.   On: 04/14/2024 20:25        Scheduled Meds:  amLODipine   5 mg Oral Daily   arformoterol   15 mcg Nebulization BID   calcium  carbonate  1 tablet Oral Daily   dapagliflozin  propanediol  10 mg Oral Daily   enoxaparin  (LOVENOX ) injection  40 mg Subcutaneous Q24H   furosemide   40 mg Intravenous Q12H   ipratropium-albuterol   3 mL Nebulization Q6H   methylPREDNISolone  (SOLU-MEDROL ) injection  40 mg Intravenous Q12H   pregabalin   150 mg Oral BID   rosuvastatin   20 mg Oral Daily   spironolactone   25 mg Oral Daily   Continuous Infusions:  cefTRIAXone  (ROCEPHIN )  IV Stopped (04/15/24 0118)     LOS: 1 day     Calvin KATHEE Robson, MD [facility]   If 7PM-7AM, please contact night-coverage  04/15/2024, 6:02 PM

## 2024-04-15 NOTE — Assessment & Plan Note (Addendum)
Blood pressure much improved 

## 2024-04-15 NOTE — Telephone Encounter (Signed)
 Pharmacy Patient Advocate Encounter  Received notification from [facility] MEDICAID that Prior Authorization for Farxiga  10MG  tablet  has been APPROVED from 04/15/2024 to 04/15/2025. Ran test claim, Copay is $4.00. This test claim was processed through [facility]- copay amounts may vary at other pharmacies due to pharmacy/plan contracts, or as the patient moves through the different stages of their insurance plan.   PA #/Case ID/Reference #: [id number]

## 2024-04-15 NOTE — Evaluation (Signed)
 Physical Therapy Evaluation Patient Details Name: Cindy Gutierrez MRN: [id number] DOB: October 03, 1961 Today's Date: 04/15/2024  History of Present Illness  Pt is a 63 y.o. female who presented to the ED with acute onset of acute respiratory distress with worsening dyspnea that started this evening. Admitted with acute respiratory failure with hypoxia and hypercarbia, COPD with acute exacerbation, hypertensive urgency. PMH of COPD, CHF, coronary artery disease, depression, hepatitis C, hyperparathyroidism, hypertension, mitral valve regurgitation, and pulmonary hypertension.  Clinical Impression  Pt is a pleasant 63 year old female who was admitted for acute respiratory failure w/ hypoxia. Vitals monitored throughout session, pt on 2L O2, stats maintaining above 94%. Pt performs STS transfer with supervision assist and no AD used, no LOB. Ambulation x110' with no AD, pt demo baseline level per pt, no LOB experienced. Pt stating she does not need any further physical therapy needs at this time, consult if statuys changes.         If plan is discharge home, recommend the following: A little help with walking and/or transfers;Help with stairs or ramp for entrance   Can travel by private vehicle        Equipment Recommendations None recommended by PT  Recommendations for Other Services       Functional Status Assessment Patient has not had a recent decline in their functional status     Precautions / Restrictions Precautions Precautions: Fall Recall of Precautions/Restrictions: Intact Restrictions Weight Bearing Restrictions Per Provider Order: No      Mobility  Bed Mobility               General bed mobility comments: found sitting up EOB    Transfers Overall transfer level: Modified independent Equipment used: None               General transfer comment: supervision for ambulation and STS,  2L 02 throughout session and stable in mid 90s     Ambulation/Gait Ambulation/Gait assistance: Supervision Gait Distance (Feet): 110 Feet Assistive device: None Gait Pattern/deviations: WFL(Within Functional Limits)       General Gait Details: WNL, no LOB experienced, no use of AD  Stairs            Wheelchair Mobility     Tilt Bed    Modified Rankin (Stroke Patients Only)       Balance Overall balance assessment: Needs assistance Sitting-balance support: Feet supported Sitting balance-Leahy Scale: Normal Sitting balance - Comments: sitting EOB   Standing balance support: No upper extremity supported, During functional activity Standing balance-Leahy Scale: Good                               Pertinent Vitals/Pain Pain Assessment Pain Assessment: No/denies pain    Home Living Family/patient expects to be discharged to:: Private residence Living Arrangements: Children Available Help at Discharge: Family;Available 24 hours/day Type of Home: Apartment Home Access: Stairs to enter Entrance Stairs-Rails: Lawyer of Steps: flight   Home Layout: One level Home Equipment: None Additional Comments: no use of AD    Prior Function Prior Level of Function : Independent/Modified Independent             Mobility Comments: denies falls; lives with her 2 sons, reports there is mold in her apt; intermittent/PRN use of 02 at home ADLs Comments: IND     Extremity/Trunk Assessment   Upper Extremity Assessment Upper Extremity Assessment: Overall WFL for tasks assessed  Lower Extremity Assessment Lower Extremity Assessment: Overall WFL for tasks assessed    Cervical / Trunk Assessment Cervical / Trunk Assessment: Normal  Communication   Communication Communication: No apparent difficulties    Cognition Arousal: Alert Behavior During Therapy: WFL for tasks assessed/performed   PT - Cognitive impairments: No apparent impairments                          Following commands: Intact       Cueing Cueing Techniques: Verbal cues     General Comments General comments (skin integrity, edema, etc.): sp02 stable on 2L in 90's throughout session, HR stable    Exercises     Assessment/Plan    PT Assessment Patient does not need any further PT services  PT Problem List         PT Treatment Interventions      PT Goals (Current goals can be found in the Care Plan section)  Acute Rehab PT Goals Patient Stated Goal: to return home PT Goal Formulation: With patient Time For Goal Achievement: 04/29/24 Potential to Achieve Goals: Good    Frequency       Co-evaluation               AM-PAC PT 6 Clicks Mobility  Outcome Measure Help needed turning from your back to your side while in a flat bed without using bedrails?: None Help needed moving from lying on your back to sitting on the side of a flat bed without using bedrails?: None Help needed moving to and from a bed to a chair (including a wheelchair)?: A Little Help needed standing up from a chair using your arms (e.g., wheelchair or bedside chair)?: A Little Help needed to walk in hospital room?: A Little Help needed climbing 3-5 steps with a railing? : A Little 6 Click Score: 20    End of Session Equipment Utilized During Treatment: Oxygen Activity Tolerance: Patient tolerated treatment well Patient left: in bed;with call bell/phone within reach Nurse Communication: Mobility status PT Visit Diagnosis: Unsteadiness on feet (R26.81)    Time: 8596-8584 PT Time Calculation (min) (ACUTE ONLY): 12 min   Charges:                  Arinze Rivadeneira Romero-Perozo, SPT  04/15/2024, 3:51 PM

## 2024-04-15 NOTE — Telephone Encounter (Signed)
 Patient Product/process development scientist completed.    The patient is insured through [facility] MEDICAID.     Ran test claim for Farxiga  10 mg and Requires Prior Authorization   This test claim was processed through [facility]- copay amounts may vary at other pharmacies due to [employer], or as the patient moves through the different stages of their insurance plan.     Reyes Sharps, CPHT Pharmacy Technician III Certified Patient Advocate [facility] Patient Advocate Team Direct Number: [phone]  Fax: [phone]

## 2024-04-15 NOTE — H&P (Addendum)
 [place]   PATIENT NAME: Cindy Gutierrez    MR#:  [id number]  DATE OF BIRTH:  Jan 31, 1961  DATE OF ADMISSION:  04/14/2024  PRIMARY CARE PHYSICIAN: [facility], [facility]   Patient is coming from: Home  REQUESTING/REFERRING PHYSICIAN: Viviann Mungo, MD  CHIEF COMPLAINT:   Chief Complaint  Patient presents with   Respiratory Distress    HISTORY OF PRESENT ILLNESS:  Cindy Gutierrez is a 63 y.o. female with medical history significant for COPD, CHF, coronary artery disease, depression, hepatitis C, hyperparathyroidism, hypertension, mitral valve regurgitation, and pulmonary hypertension, who presented to the emergency room with acute onset of acute respiratory distress with worsening dyspnea that started this evening.  She has not been able to tolerate CPAP that was placed by EMS.  She has associated cough or wheezing.  No fever or chills.  No nausea or vomiting or abdominal pain.  No dysuria, oliguria or hematuria or flank pain.     ED course: When the patient came to the ER BP was 214/113 and later 178/79 with heart rate 153 and respiratory rate of 25 and pulse continues 98% on 40% FiO2 on BiPAP.  BP later on was down to 160/68.  Labs revealed a VBG with pH 7.25, PCO2 of 76 and pO2 86 with HCO3 of 33.3 and O2 sat of 97.3% on 100% nonrebreather.  CMP was unremarkable.  BNP was elevated at 720.4.  High sensitive troponin I was 18 and later 71.  CBC showed leukocytosis 12.2.  Procalcitonin was 0.37 EKG as reviewed by me : EKG showed sinus tachycardia with rate of 110 with left axis deviation and minimal voltage criteria for LVH with Q waves anteroseptally. Imaging: Portable chest x-ray showed diffuse interstitial opacities superimposed upon emphysema consistent with edema or atypical infection with no focal airspace opacity.  The patient was given 40 mg IV Lasix , 1 mg of IV lorazepam , 2 g of IV magnesium  sulfate and 125 mg of IV Solu-Medrol  as well as 5 mg of nebulized albuterol  and  1.25 mg of IV Vasotec.  The patient will be admitted to a progressive unit bed for further evaluation and management. PAST MEDICAL HISTORY:   Past Medical History:  Diagnosis Date   Aortic valve regurgitation    CHF (congestive heart failure) (HCC)    COPD (chronic obstructive pulmonary disease) (HCC)    Coronary artery disease    Depression    Hepatitis C    Hyperparathyroidism (HCC)    Hypertension    Mitral valve regurgitation    Neuropathy    to R arm only   Non-toxic multinodular goiter    Pulmonary hypertension (HCC) 2022   noted on CT   Pulmonary nodule    Thyroid  disease     PAST SURGICAL HISTORY:   Past Surgical History:  Procedure Laterality Date   COLONOSCOPY WITH PROPOFOL  N/A 08/21/2018   Procedure: COLONOSCOPY WITH PROPOFOL ;  Surgeon: Unk Corinn Skiff, MD;  Location: [facility];  Service: Gastroenterology;  Laterality: N/A;   PARATHYROIDECTOMY Left    TUBAL LIGATION      SOCIAL HISTORY:   Social History   Tobacco Use   Smoking status: Former    Types: Cigarettes    Start date: 2023    Quit date: 12/04/1993    Years since quitting: 30.3   Smokeless tobacco: Never  Substance Use Topics   Alcohol use: Never    FAMILY HISTORY:   Family History  Problem Relation Age of Onset  Alzheimer's disease Mother    Heart disease Father     DRUG ALLERGIES:   Allergies  Allergen Reactions   Sulfa Antibiotics Hives and Rash   Bee Pollen Hives   Lisinopril Nausea Only   Losartan     Penicillins Hives   Penicillins    Sulfa Antibiotics    Tiotropium Bromide Monohydrate     Other reaction(s): Cough   Carvedilol     REVIEW OF SYSTEMS:   ROS As per history of present illness. All pertinent systems were reviewed above. Constitutional, HEENT, cardiovascular, respiratory, GI, GU, musculoskeletal, neuro, psychiatric, endocrine, integumentary and hematologic systems were reviewed and are otherwise negative/unremarkable except for positive findings  mentioned above in the HPI.   MEDICATIONS AT HOME:   Prior to Admission medications   Medication Sig Start Date End Date Taking? Authorizing Provider  acetaminophen  (TYLENOL ) 325 MG tablet Take 650 mg by mouth 3 (three) times daily as needed for mild pain (pain score 1-3).   Yes [provider]  albuterol  (PROVENTIL ) (2.5 MG/3ML) 0.083% nebulizer solution Take 2.5 mg by nebulization every 4 (four) hours as needed for wheezing or shortness of breath.   Yes [provider]  albuterol  (VENTOLIN  HFA) 108 (90 Base) MCG/ACT inhaler Inhale 2 puffs into the lungs every 6 (six) hours as needed for wheezing or shortness of breath. 10/19/23  Yes Wouk, Devaughn Sayres, MD  amLODipine  (NORVASC ) 10 MG tablet Take 1 tablet (10 mg total) by mouth daily. 10/20/23  Yes Wouk, Devaughn Sayres, MD  budesonide -formoterol  (SYMBICORT ) 160-4.5 MCG/ACT inhaler Inhale 2 puffs into the lungs 2 (two) times daily.   Yes [provider]  calcium  carbonate (TUMS - DOSED IN MG ELEMENTAL CALCIUM ) 500 MG chewable tablet Chew 1 tablet by mouth daily. As needed   Yes [provider]  celecoxib  (CELEBREX ) 200 MG capsule Take 200 mg by mouth 2 (two) times daily.   Yes [provider]  pregabalin  (LYRICA ) 150 MG capsule Take 150 mg by mouth 2 (two) times daily.   Yes [provider]  rosuvastatin  (CRESTOR ) 20 MG tablet Take 20 mg by mouth daily.   Yes [provider]  aspirin  81 MG chewable tablet Chew 81 mg by mouth daily. Patient not taking: Reported on 04/14/2024 04/13/22   [provider]  calcium  citrate (CALCITRATE - DOSED IN MG ELEMENTAL CALCIUM ) 950 (200 Ca) MG tablet Take 200 mg of elemental calcium  by mouth daily. Patient not taking: Reported on 04/14/2024    [provider]  chlorthalidone  (HYGROTON ) 25 MG tablet Take 0.5 tablets (12.5 mg total) by mouth daily. Patient not taking: Reported on 04/14/2024 10/20/23   Wouk, Devaughn Sayres, MD   Dextromethorphan -guaiFENesin  20-200 MG/20ML LIQD Take 20 mLs by mouth every 8 (eight) hours as needed. Patient not taking: Reported on 04/14/2024 11/20/23   Von Bellis, MD  diclofenac  Sodium (VOLTAREN ) 1 % GEL Apply 2 g topically 4 (four) times daily. Patient not taking: Reported on 04/14/2024 09/21/22   [provider]  escitalopram  (LEXAPRO ) 10 MG tablet Take 10 mg by mouth daily. Patient not taking: Reported on 04/14/2024    [provider]  famotidine  (PEPCID ) 20 MG tablet Take 20 mg by mouth 2 (two) times daily. Patient not taking: Reported on 04/14/2024    [provider]  furosemide  (LASIX ) 20 MG tablet Take 20 mg by mouth daily. Patient not taking: Reported on 04/14/2024 08/01/23   [provider]  Glecaprevir -Pibrentasvir  (MAVYRET ) 100-40 MG TABS Take 3 tablets by mouth  daily. Patient not taking: Reported on 04/14/2024    [provider]  iron  polysaccharides (NIFEREX) 150 MG capsule Take 1 capsule (150 mg total) by mouth daily. Patient not taking: Reported on 04/14/2024 11/21/23 02/19/24  Von Bellis, MD  loratadine  (CLARITIN ) 10 MG tablet Take 1 tablet (10 mg total) by mouth daily as needed for allergies or rhinitis. Patient not taking: Reported on 04/14/2024 11/20/23 11/19/24  Von Bellis, MD  Tiotropium Bromide Monohydrate (SPIRIVA RESPIMAT) 2.5 MCG/ACT AERS Inhale 2 puffs into the lungs daily. Patient not taking: Reported on 04/14/2024    [provider]      VITAL SIGNS:  Blood pressure (!) 157/64, pulse 66, temperature 98.5 F (36.9 C), temperature source Axillary, resp. rate 18, height 5' 8 (1.727 m), weight 81 kg, SpO2 97%.  PHYSICAL EXAMINATION:  Physical Exam  GENERAL: Acutely ill 63 y.o.-year-old female patient lying in the bed with moderate respiratory distress with conversational dyspnea, initially on BiPAP and later on nasal cannula. EYES: Pupils equal, round, reactive to light and accommodation. No scleral  icterus. Extraocular muscles intact.  HEENT: Head atraumatic, normocephalic. Oropharynx and nasopharynx clear.  NECK:  Supple, no jugular venous distention. No thyroid  enlargement, no tenderness.  LUNGS: Diminished bibasilar breath sounds with bibasilar rales.  No use of accessory muscles of respiration.  CARDIOVASCULAR: Regular rate and rhythm, S1, S2 normal. No murmurs, rubs, or gallops.  ABDOMEN: Soft, nondistended, nontender. Bowel sounds present. No organomegaly or mass.  EXTREMITIES: 1+ bilateral pedal and leg edema, with no cyanosis, or clubbing.  NEUROLOGIC: Cranial nerves II through XII are intact. Muscle strength 5/5 in all extremities. Sensation intact. Gait not checked.  PSYCHIATRIC: The patient is alert and oriented x 3.  Normal affect and good eye contact. SKIN: No obvious rash, lesion, or ulcer.   LABORATORY PANEL:   CBC Recent Labs  Lab 04/14/24 2005  WBC 12.2*  HGB 13.1  HCT 42.7  PLT 305   ------------------------------------------------------------------------------------------------------------------  Chemistries  Recent Labs  Lab 04/14/24 2005  NA 144  K 4.4  CL 108  CO2 30  GLUCOSE 118*  BUN 17  CREATININE 0.63  CALCIUM  9.0  AST 20  ALT 12  ALKPHOS 99  BILITOT 0.5   ------------------------------------------------------------------------------------------------------------------  Cardiac Enzymes No results for input(s): TROPONINI in the last 168 hours. ------------------------------------------------------------------------------------------------------------------  RADIOLOGY:  DG Chest Portable 1 View Result Date: 04/14/2024 CLINICAL DATA:  Shortness of breath EXAM: PORTABLE CHEST 1 VIEW COMPARISON:  12/05/2023 FINDINGS: The heart size and mediastinal contours are within normal limits. Diffuse interstitial opacity superimposed upon emphysema. The visualized skeletal structures are unremarkable. IMPRESSION: Diffuse interstitial opacity  superimposed upon emphysema, consistent with edema or atypical/viral infection. No focal airspace opacity. Electronically Signed   By: Marolyn JONETTA Jaksch M.D.   On: 04/14/2024 20:25      IMPRESSION AND PLAN:  Assessment and Plan: * Acute respiratory failure with hypoxia and hypercarbia (HCC) - This is likely multifactorial due to acute on chronic diastolic CHF and COPD exacerbation.  -The patient will be admitted to a cardiac telemetry bed. - We will continue diuresis with IV Lasix . - We Will follow serial troponins. - We will follow I's and O's and daily weights. - Cardiology consult be obtained. - I notified Dr. Florencio about the patient was followed in the past by Dr. Fernand. -Management otherwise as below.   COPD with acute exacerbation (HCC) - The patient will be admitted to a medically monitored bed. - We will place the patient IV steroid  therapy with IV Solu-Medrol  as well as nebulized bronchodilator therapy with duonebs q.i.d. and q.4 hours p.r.n.SABRA - Mucolytic therapy will be provided with Mucinex  and antibiotic therapy with IV Rocephin . - O2 protocol will be followed.   Hypertensive urgency - The could be contributing to his acute CHF. - Will continue antihypertensive therapy. - The patient will be placed on as needed IV hydralazine .  Dyslipidemia - Will continue statin therapy.  Peripheral neuropathy - Will continue Lyrica .   DVT prophylaxis: Lovenox .  Advanced Care Planning:  Code Status: full code.  Family Communication:  The plan of care was discussed in details with the patient (and family). I answered all questions. The patient agreed to proceed with the above mentioned plan. Further management will depend upon hospital course. Disposition Plan: Back to previous home environment Consults called: Cardiology All the records are reviewed and case discussed with ED provider.  Status is: Inpatient  At the time of the admission, it appears that the appropriate  admission status for this patient is inpatient.  This is judged to be reasonable and necessary in order to provide the required intensity of service to ensure the patient's safety given the presenting symptoms, physical exam findings and initial radiographic and laboratory data in the context of comorbid conditions.  The patient requires inpatient status due to high intensity of service, high risk of further deterioration and high frequency of surveillance required.  I certify that at the time of admission, it is my clinical judgment that the patient will require inpatient hospital care extending more than 2 midnights.                            Dispo: The patient is from: Home              Anticipated d/c is to: Home              Patient currently is not medically stable to d/c.              Difficult to place patient: No  Authorized and performed by: Madison Peaches, MD Total critical care time:   55   minutes. Due to a high probability of clinically significant, life-threatening deterioration, the patient required my highest level of preparedness to intervene emergently and I personally spent this critical care time directly and personally managing the patient.  This critical care time included obtaining a history, examining the patient, pulse oximetry, ordering and review of studies, arranging urgent treatment with development of management plan, evaluation of patient's response to treatment, frequent reassessment, and discussions with other providers. This critical care time was performed to assess and manage the high probability of imminent, life-threatening deterioration that could result in multiorgan failure.  It was exclusive of separately billable procedures and treating other patients and teaching time.   Madison DELENA Peaches M.D on 04/15/2024 at 2:36 AM  [facility]   From 7 PM-7 AM, contact night-coverage [email or web address]  CC: Primary care physician; Administration, [employer]

## 2024-04-15 NOTE — Evaluation (Signed)
 Occupational Therapy Evaluation Patient Details Name: Cindy Gutierrez MRN: [id number] DOB: 10-22-1960 Today's Date: 04/15/2024   History of Present Illness   Pt is a 63 y.o. female who presented to the ED with acute onset of acute respiratory distress with worsening dyspnea that started this evening. Admitted with acute respiratory failure with hypoxia and hypercarbia, COPD with acute exacerbation, hypertensive urgency. PMH of COPD, CHF, coronary artery disease, depression, hepatitis C, hyperparathyroidism, hypertension, mitral valve regurgitation, and pulmonary hypertension.     Clinical Impressions Pt was seen for OT evaluation this date. PTA, pt resides in a 2nd level apartment with her 2 sons where she reports IND with ADLs/IADLs without AD use. Denies falls. Uses 02 PRN. Pt reports she was about [facility] her neuropathy in her hands.  Pt presents to acute OT demonstrating impaired ADL performance and functional mobility 2/2 mild weakness and low activity tolerance. Pt currently requires MOD I for bed mobility, supervision for STS and SPT to [facility] along with all aspects of toileting. Pt remained stable on 2L 02 via [place]. She ambulated ~80-90 ft using no device with SBA and no LOB on 2L 02 with sp02 remaining in mid 90's. HR stable. Pt with some increased WOB, but reports she feels she is doing well.  Pt would benefit from skilled OT services to address noted impairments and functional limitations to maximize safety and independence while minimizing falls risk and caregiver burden. Do anticipate the need for follow up OT services upon acute hospital DC.      If plan is discharge home, recommend the following:   A little help with walking and/or transfers;A little help with bathing/dressing/bathroom;Assistance with cooking/housework;Help with stairs or ramp for entrance     Functional Status Assessment   Patient has had a recent decline in their functional  status and demonstrates the ability to make significant improvements in function in a reasonable and predictable amount of time.     Equipment Recommendations         Recommendations for Other Services         Precautions/Restrictions   Precautions Precautions: Fall Recall of Precautions/Restrictions: Intact Restrictions Weight Bearing Restrictions Per Provider Order: No     Mobility Bed Mobility Overal bed mobility: Modified Independent                  Transfers Overall transfer level: Modified independent Equipment used: None               General transfer comment: supervision for ambulation and STS, however increased WOB with all activity; 2L 02 throughout session and stable in mid 90s      Balance Overall balance assessment: Needs assistance Sitting-balance support: Feet supported Sitting balance-Leahy Scale: Normal     Standing balance support: No upper extremity supported, During functional activity Standing balance-Leahy Scale: Fair                             ADL either performed or assessed with clinical judgement   ADL Overall ADL's : Needs assistance/impaired                     Lower Body Dressing: Contact guard assist Lower Body Dressing Details (indicate cue type and reason): in standing at bedside Toilet Transfer: Supervision/safety;Contact guard assist;BSC/3in1   Toileting- Clothing Manipulation and Hygiene: Supervision/safety;Sitting/lateral lean;Sit to/from stand       Functional mobility during  ADLs: Supervision/safety       Vision         Perception         Praxis         Pertinent Vitals/Pain Pain Assessment Pain Assessment: Faces Faces Pain Scale: Hurts a little bit Pain Location: BLEs Pain Intervention(s): Monitored during session, Repositioned     Extremity/Trunk Assessment Upper Extremity Assessment Upper Extremity Assessment: Overall WFL for tasks assessed   Lower  Extremity Assessment Lower Extremity Assessment: Generalized weakness       Communication Communication Communication: No apparent difficulties   Cognition Arousal: Alert Behavior During Therapy: WFL for tasks assessed/performed Cognition: No apparent impairments                               Following commands: Intact       Cueing  General Comments      sp02 stable on 2L in 90's throughout session, HR stable   Exercises Other Exercises Other Exercises: Edu on role of OT in acute setting and importance of pacing, ECS, and PLB.   Shoulder Instructions      Home Living Family/patient expects to be discharged to:: Private residence Living Arrangements: Children Available Help at Discharge: Family;Available 24 hours/day Type of Home: Apartment Home Access: Stairs to enter [employer] of Steps: flight Entrance Stairs-Rails: Left;Right Home Layout: One level                          Prior Functioning/Environment Prior Level of Function : Independent/Modified Independent             Mobility Comments: denies falls; lives with her 2 sons, reports there is mold in her apt; intermittent/PRN use of 02 at home ADLs Comments: IND    OT Problem List: Decreased activity tolerance;Decreased strength   OT Treatment/Interventions: Self-care/ADL training;Therapeutic exercise;Therapeutic activities;Energy conservation;DME and/or AE instruction;Patient/family education;Balance training      OT Goals(Current goals can be found in the care plan section)   Acute Rehab OT Goals Patient Stated Goal: return home and improve breathing OT Goal Formulation: With patient Time For Goal Achievement: 04/29/24 Potential to Achieve Goals: Good ADL Goals Pt Will Perform Lower Body Bathing: with set-up;sit to/from stand;sitting/lateral leans Pt Will Perform Lower Body Dressing: with set-up;sitting/lateral leans;sit to/from stand Pt Will Transfer to  Toilet: with modified independence;ambulating;regular height toilet Additional ADL Goal #1: Pt will demo 1 learned ECS during ADL performance 2/2 trials to maximize IND/safety and prevent overexertion.   OT Frequency:  Min 2X/week    Co-evaluation              AM-PAC OT 6 Clicks Daily Activity     Outcome Measure Help from another person eating meals?: None Help from another person taking care of personal grooming?: None Help from another person toileting, which includes using toliet, bedpan, or urinal?: A Little Help from another person bathing (including washing, rinsing, drying)?: A Little Help from another person to put on and taking off regular upper body clothing?: None Help from another person to put on and taking off regular lower body clothing?: A Little 6 Click Score: 21   End of Session Equipment Utilized During Treatment: Gait belt;Oxygen Nurse Communication: Mobility status  Activity Tolerance: Patient tolerated treatment well Patient left: in bed;with call bell/phone within reach;with bed alarm set  OT Visit Diagnosis: Other abnormalities of gait and mobility (R26.89)  Time: 8881-8854 OT Time Calculation (min): 27 min Charges:  OT General Charges $OT Visit: 1 Visit OT Evaluation $OT Eval Moderate Complexity: 1 Mod OT Treatments $Self Care/Home Management : 8-22 mins Milly Goggins, OTR/L 04/15/24, 1:02 PM  Yavonne Kiss E Adonai Selsor 04/15/2024, 12:57 PM

## 2024-04-15 NOTE — Assessment & Plan Note (Signed)
Continue Solu-Medrol and nebulizer treatments.

## 2024-04-15 NOTE — Consult Note (Signed)
 [facility] CLINIC CARDIOLOGY CONSULT NOTE       Patient ID: Cindy Gutierrez MRN: [id number] DOB/AGE: 12-01-1960 63 y.o.  Admit date: 04/14/2024 Referring Physician Dr. Lawence Primary Physician Administration, [facility] Cardiologist Dr. Paraschos/Dr. Deretha (2019), requests to follow with Dr. Florencio Reason for Consultation AoCHpEF  HPI: Cindy Gutierrez is a 63 y.o. female  with a past medical history of chronic HFpEF, coronary artery disease, mitral regurgitation, pulm hypertension, COPD, hypertension, tobacco use, depression, hepatitis C  who presented to the ED on 04/14/2024 for acute onset acute respiratory distress with worsening dyspnea, lower extremity edema. Cardiology was consulted for further evaluation.   Patient presented to the ED with acute onset of respiratory distress with worsening dyspnea, lower extremity edema.  Patient endorses orthopnea however states she sleeps almost upright with many pillows for years now.  Patient denies cough or chest pain. Work up in the ED notable for ABG with pH 7.25, pCO2 76, bicarb 33, K 4.4, Cr 0.63, Hgb 13.1, plts 305. LFTs within normal limits. BNP elevated at 720. CXR with pulmonary vascular congestion.  Trops minimally elevated at 18 > 71. EKG with sinus tachycardia, rate 110 bpm without acute ischemic changes. Patient given 2x IV lasix  40 mg, solumedrol, nebulizer and magnesium  replacement.   At the time of my evaluation this AM, patient was resting comfortably in ED stretcher. We discussed patients sxs in further detail. Patient states she felt acute onset shortness of breath and she panicked which caused worsening SOB. Patient states she feels like she had COPD exacerbation. Patient reports she still smoke tobacco and currently trying to quit, on patches. Patient states last cigarette was yesterday. Patient endorses lower extremity edema. Patient denies chest pain, palpitations, lightheadedness or cough. Patient states she feels her SOB has  improved since admission s/p IV lasix  and breathing treatments. Patient currently on 2L O2 in ED and states she normally uses 2-3L PRN with exertion.    Review of systems complete and found to be negative unless listed above    Past Medical History:  Diagnosis Date   Aortic valve regurgitation    CHF (congestive heart failure) (HCC)    COPD (chronic obstructive pulmonary disease) (HCC)    Coronary artery disease    Depression    Hepatitis C    Hyperparathyroidism (HCC)    Hypertension    Mitral valve regurgitation    Neuropathy    to R arm only   Non-toxic multinodular goiter    Pulmonary hypertension (HCC) 2022   noted on CT   Pulmonary nodule    Thyroid  disease     Past Surgical History:  Procedure Laterality Date   COLONOSCOPY WITH PROPOFOL  N/A 08/21/2018   Procedure: COLONOSCOPY WITH PROPOFOL ;  Surgeon: Unk Corinn Skiff, MD;  Location: [facility] ENDOSCOPY;  Service: Gastroenterology;  Laterality: N/A;   PARATHYROIDECTOMY Left    TUBAL LIGATION      (Not in a hospital admission)  Social History   Socioeconomic History   Marital status: Single    Spouse name: Not on file   Number of children: Not on file   Years of education: Not on file   Highest education level: Not on file  Occupational History   Not on file  Tobacco Use   Smoking status: Former    Types: Cigarettes    Start date: 2023    Quit date: 12/04/1993    Years since quitting: 30.3   Smokeless tobacco: Never  Vaping Use   Vaping status:  Never Used  Substance and Sexual Activity   Alcohol use: Never   Drug use: Never   Sexual activity: Not Currently  Other Topics Concern   Not on file  Social History Narrative   ** Merged History Encounter **       Social Drivers of Health   Financial Resource Strain: Not on file  Food Insecurity: No Food Insecurity (12/06/2023)   Hunger Vital Sign    Worried About Running Out of Food in the Last Year: Never true    Ran Out of Food in the Last Year: Never true   Recent Concern: Food Insecurity - Food Insecurity Present (09/19/2023)   Hunger Vital Sign    Worried About Running Out of Food in the Last Year: Sometimes true    Ran Out of Food in the Last Year: Sometimes true  Transportation Needs: No Transportation Needs (12/06/2023)   PRAPARE - Administrator, Civil Service (Medical): No    Lack of Transportation (Non-Medical): No  Physical Activity: Not on file  Stress: Not on file  Social Connections: Not on file  Intimate Partner Violence: Not At Risk (12/06/2023)   Humiliation, Afraid, Rape, and Kick questionnaire    Fear of Current or Ex-Partner: No    Emotionally Abused: No    Physically Abused: No    Sexually Abused: No    Family History  Problem Relation Age of Onset   Alzheimer's disease Mother    Heart disease Father      Vitals:   04/15/24 0700 04/15/24 0719 04/15/24 0907 04/15/24 1150  BP: (!) 145/61     Pulse: 64     Resp: 20     Temp:  97.7 F (36.5 C)  98 F (36.7 C)  TempSrc:  Oral    SpO2: 100%  100%   Weight:      Height:        PHYSICAL EXAM General: Chronically ill appearing female, well nourished, in no acute distress. HEENT: Normocephalic and atraumatic. Neck: No JVD.   Lungs: Normal respiratory effort on 2L (home 2-3L PRN). Diminished breath sounds bilaterally Heart: HRRR. Normal S1 and S2 without gallops or murmurs.  Abdomen: Non-distended appearing.  Msk: Normal strength and tone for age. Extremities: Warm and well perfused. No clubbing, cyanosis, 1+ pedal edema.  Neuro: Alert and oriented X 3. Psych: Answers questions appropriately.   Labs: Basic Metabolic Panel: Recent Labs    04/14/24 2005 04/15/24 0622  NA 144 143  K 4.4 4.2  CL 108 104  CO2 30 29  GLUCOSE 118* 231*  BUN 17 16  CREATININE 0.63 0.69  CALCIUM  9.0 8.6*   Liver Function Tests: Recent Labs    04/14/24 2005  AST 20  ALT 12  ALKPHOS 99  BILITOT 0.5  PROT 7.1  ALBUMIN 4.2   No results for input(s):  LIPASE, AMYLASE in the last 72 hours. CBC: Recent Labs    04/14/24 2005 04/15/24 0622  WBC 12.2* 6.7  NEUTROABS 6.4  --   HGB 13.1 12.3  HCT 42.7 38.2  MCV 89.0 87.0  PLT 305 223   Cardiac Enzymes: Recent Labs    04/14/24 2005 04/14/24 2209  TROPONINIHS 18* 71*   BNP: Recent Labs    04/14/24 2005  BNP 720.4*   D-Dimer: No results for input(s): DDIMER in the last 72 hours. Hemoglobin A1C: No results for input(s): HGBA1C in the last 72 hours. Fasting Lipid Panel: No results for input(s): CHOL, HDL, LDLCALC,  TRIG, CHOLHDL, LDLDIRECT in the last 72 hours. Thyroid  Function Tests: No results for input(s): TSH, T4TOTAL, T3FREE, THYROIDAB in the last 72 hours.  Invalid input(s): FREET3 Anemia Panel: No results for input(s): VITAMINB12, FOLATE, FERRITIN, TIBC, IRON , RETICCTPCT in the last 72 hours.   Radiology: DG Chest Portable 1 View Result Date: 04/14/2024 CLINICAL DATA:  Shortness of breath EXAM: PORTABLE CHEST 1 VIEW COMPARISON:  12/05/2023 FINDINGS: The heart size and mediastinal contours are within normal limits. Diffuse interstitial opacity superimposed upon emphysema. The visualized skeletal structures are unremarkable. IMPRESSION: Diffuse interstitial opacity superimposed upon emphysema, consistent with edema or atypical/viral infection. No focal airspace opacity. Electronically Signed   By: Marolyn JONETTA Jaksch M.D.   On: 04/14/2024 20:25    ECHO 10/17/2023  1. Left ventricular ejection fraction, by estimation, is 55 to 60%. The left ventricle has normal function. The left ventricle has no regional wall motion abnormalities. Left ventricular diastolic parameters are consistent with Grade I diastolic dysfunction (impaired relaxation).   2. Right ventricular systolic function is normal. The right ventricular size is normal. There is mildly elevated pulmonary artery systolic pressure.   3. The mitral valve is abnormal. Moderate mitral  valve regurgitation. No evidence of mitral stenosis.   4. The aortic valve was not well visualized. Aortic valve regurgitation is moderate. No aortic stenosis is present.   5. The inferior vena cava is dilated in size with <50% respiratory variability, suggesting right atrial pressure of 15 mmHg.   TELEMETRY reviewed by me 04/15/2024: sinus rhythm, rate 70s  EKG reviewed by me: sinus tachycardia, rate 110 bpm  Data reviewed by me 04/15/2024: last 24h vitals tele labs imaging I/O ED provider note, admission H&P.  Principal Problem:   Acute respiratory failure with hypoxia and hypercarbia (HCC) Active Problems:   COPD with acute exacerbation (HCC)   COPD exacerbation (HCC)   Dyslipidemia   Peripheral neuropathy   Hypertensive urgency   Acute on chronic diastolic CHF (congestive heart failure) (HCC)    ASSESSMENT AND PLAN:  Cindy Gutierrez is a 63 y.o. female  with a past medical history of chronic HFpEF, coronary artery disease, mitral regurgitation, hypertension, COPD, tobacco use, depression, hepatitis C  who presented to the ED on 04/14/2024 for acute onset acute respiratory distress with worsening dyspnea, lower extremity edema. Cardiology was consulted for further evaluation.   # Acute on chronic HFpEF # COPD exacerbation  # Acute respiratory failure with hypoxia Patient presents with SOB, LEE likely multifactorial. BNP elevated at 720. CXR with pulmonary vascular congestion. Echo from 10/2023 with pEF (55-60%), grade I diastolic dysfunction, moderate MR.  -Monitor and replenish electrolytes for a goal K >4, Mag >2  -Continue IV lasix  40 mg BID. Closely monitor UOP, renal function. -Ordered farxiga  10 mg daily (Copay $4) -Orderespironolactone  25 mg daily.  -COPD management per primary team.   # Coronary artery disease # Hypertension Trops minimally elevated at 18 > 71. EKG with sinus tachycardia, rate 110 bpm without acute ischemic changes. Of note patient has documented allergy  to lisinopril, losartan  and coreg, patient states she doesn't remember her reaction is to these medications.  -Continue rosuvastatin  20 mg daily.  -Decreased amlodipine  to 5 mg daily as this may be contributing to LEE.  -Spironolactone  as stated above.  -BP medications are limited to documented allergies. If BP remains will consider hydrochlorothiazide  or BB/CCB depending on HR.  -Minimally elevated trops in setting of acute respiratory failure, AoCHFpEF, COPD exacerbation is most consistent with demand/supply  mismatch and not ACS    This patient's plan of care was discussed and created with Dr. Florencio and he is in agreement.  Signed: Dorene Comfort, PA-C  04/15/2024, 12:51 PM [facility] Cardiology

## 2024-04-15 NOTE — Assessment & Plan Note (Signed)
-   This is likely multifactorial due to acute on chronic diastolic CHF and COPD exacerbation. Patient chronically wears 2 L of oxygen at home and initially came in with respiratory distress requiring BiPAP.

## 2024-04-16 DIAGNOSIS — J9601 Acute respiratory failure with hypoxia: Secondary | ICD-10-CM | POA: Diagnosis not present

## 2024-04-16 DIAGNOSIS — J9602 Acute respiratory failure with hypercapnia: Secondary | ICD-10-CM | POA: Diagnosis not present

## 2024-04-16 LAB — CBC
HCT: 38.4 % (ref 36.0–46.0)
Hemoglobin: 12 g/dL (ref 12.0–15.0)
MCH: 27 pg (ref 26.0–34.0)
MCHC: 31.3 g/dL (ref 30.0–36.0)
MCV: 86.5 fL (ref 80.0–100.0)
Platelets: 234 K/uL (ref 150–400)
RBC: 4.44 MIL/uL (ref 3.87–5.11)
RDW: 14.3 % (ref 11.5–15.5)
WBC: 13.2 K/uL — ABNORMAL HIGH (ref 4.0–10.5)
nRBC: 0 % (ref 0.0–0.2)

## 2024-04-16 LAB — BASIC METABOLIC PANEL WITH GFR
Anion gap: 10 (ref 5–15)
BUN: 25 mg/dL — ABNORMAL HIGH (ref 8–23)
CO2: 32 mmol/L (ref 22–32)
Calcium: 9.1 mg/dL (ref 8.9–10.3)
Chloride: 99 mmol/L (ref 98–111)
Creatinine, Ser: 0.82 mg/dL (ref 0.44–1.00)
GFR, Estimated: >60 mL/min (ref >60)
Glucose, Bld: 169 mg/dL — ABNORMAL HIGH (ref 70–99)
Potassium: 4.1 mmol/L (ref 3.5–5.1)
Sodium: 141 mmol/L (ref 135–145)

## 2024-04-16 MED ORDER — HYDRALAZINE HCL 25 MG PO TABS
25.0000 mg | ORAL_TABLET | Freq: Three times a day (TID) | ORAL | Status: DC
  Administered 2024-04-16 – 2024-04-18 (×6): 25 mg via ORAL
  Filled 2024-04-16 (×6): qty 1

## 2024-04-16 MED ORDER — FUROSEMIDE 40 MG PO TABS
40.0000 mg | ORAL_TABLET | Freq: Every day | ORAL | Status: DC
  Filled 2024-04-16: qty 1

## 2024-04-16 MED ORDER — HYDROCHLOROTHIAZIDE 12.5 MG PO TABS
12.5000 mg | ORAL_TABLET | Freq: Every day | ORAL | Status: DC
  Administered 2024-04-16: 12.5 mg via ORAL
  Filled 2024-04-16: qty 1

## 2024-04-16 MED ORDER — METHYLPREDNISOLONE SODIUM SUCC 40 MG IJ SOLR
40.0000 mg | Freq: Every day | INTRAMUSCULAR | Status: DC
  Administered 2024-04-17 – 2024-04-18 (×2): 40 mg via INTRAVENOUS
  Filled 2024-04-16 (×2): qty 1

## 2024-04-16 MED ORDER — FUROSEMIDE 20 MG PO TABS: 20.0000 mg | ORAL_TABLET | Freq: Every day | ORAL | Status: DC

## 2024-04-16 NOTE — Progress Notes (Signed)
 [facility] CLINIC CARDIOLOGY PROGRESS NOTE       Patient ID: Cindy Gutierrez MRN: [id number] DOB/AGE: [birth year] 64 y.o.  Admit date: 04/14/2024 Referring Physician Dr. Lawence Primary Physician Administration, [facility] Cardiologist Dr. Paraschos/Dr. Deretha (2019), requests to follow with Dr. Florencio Reason for Consultation AoCHpEF  HPI: Cindy Gutierrez is a 63 y.o. female  with a past medical history of chronic HFpEF, coronary artery disease, mitral regurgitation, pulm hypertension, COPD, hypertension, tobacco use, depression, hepatitis C  who presented to the ED on 04/14/2024 for acute onset acute respiratory distress with worsening dyspnea, lower extremity edema. Cardiology was consulted for further evaluation.   Interval History: -Patient seen and examined this AM and laying comfortably in hospital bed. Patient states she had some SOB and chest tightness this AM. Patient states after breathing TX this AM the SOB/chest tightness improved.  -Patient states she's been walking to bathroom and when she has O2, she has no chest tightness or SOB. Patient states as long as she has O2 she feels okay.  -Patients BP elevated and HR stable this AM. Overnight Tele showed no significant events.  -Yesterday UOP not documented. Patient states she had good output ,with  stable renal function. -Patient remains on 2L with stable SpO2. (Patient states she uses 2-3L PRN with exertion.)   Review of systems complete and found to be negative unless listed above    Past Medical History:  Diagnosis Date   Aortic valve regurgitation    CHF (congestive heart failure) (HCC)    COPD (chronic obstructive pulmonary disease) (HCC)    Coronary artery disease    Depression    Hepatitis C    Hyperparathyroidism (HCC)    Hypertension    Mitral valve regurgitation    Neuropathy    to R arm only   Non-toxic multinodular goiter    Pulmonary hypertension (HCC) 2022   noted on CT   Pulmonary nodule    Thyroid   disease     Past Surgical History:  Procedure Laterality Date   COLONOSCOPY WITH PROPOFOL  N/A 08/21/2018   Procedure: COLONOSCOPY WITH PROPOFOL ;  Surgeon: Unk Corinn Skiff, MD;  Location: [facility] ENDOSCOPY;  Service: Gastroenterology;  Laterality: N/A;   PARATHYROIDECTOMY Left    TUBAL LIGATION      Medications Prior to Admission  Medication Sig Dispense Refill Last Dose/Taking   acetaminophen  (TYLENOL ) 325 MG tablet Take 650 mg by mouth 3 (three) times daily as needed for mild pain (pain score 1-3).   Taking As Needed   albuterol  (PROVENTIL ) (2.5 MG/3ML) 0.083% nebulizer solution Take 2.5 mg by nebulization every 4 (four) hours as needed for wheezing or shortness of breath.   Taking As Needed   albuterol  (VENTOLIN  HFA) 108 (90 Base) MCG/ACT inhaler Inhale 2 puffs into the lungs every 6 (six) hours as needed for wheezing or shortness of breath. 8 g 2 Taking As Needed   amLODipine  (NORVASC ) 10 MG tablet Take 1 tablet (10 mg total) by mouth daily. 30 tablet 1 Taking   budesonide -formoterol  (SYMBICORT ) 160-4.5 MCG/ACT inhaler Inhale 2 puffs into the lungs 2 (two) times daily.   Taking   calcium  carbonate (TUMS - DOSED IN MG ELEMENTAL CALCIUM ) 500 MG chewable tablet Chew 1 tablet by mouth daily. As needed   Taking   celecoxib  (CELEBREX ) 200 MG capsule Take 200 mg by mouth 2 (two) times daily.   Taking   pregabalin  (LYRICA ) 150 MG capsule Take 150 mg by mouth 2 (two) times daily.   Taking  rosuvastatin  (CRESTOR ) 20 MG tablet Take 20 mg by mouth daily.   Taking   aspirin  81 MG chewable tablet Chew 81 mg by mouth daily. (Patient not taking: Reported on 04/14/2024)   Not Taking   calcium  citrate (CALCITRATE - DOSED IN MG ELEMENTAL CALCIUM ) 950 (200 Ca) MG tablet Take 200 mg of elemental calcium  by mouth daily. (Patient not taking: Reported on 04/14/2024)   Not Taking   chlorthalidone  (HYGROTON ) 25 MG tablet Take 0.5 tablets (12.5 mg total) by mouth daily. (Patient not taking: Reported on 04/14/2024) 30  tablet 1 Not Taking   Dextromethorphan -guaiFENesin  20-200 MG/20ML LIQD Take 20 mLs by mouth every 8 (eight) hours as needed. (Patient not taking: Reported on 04/14/2024) 118 mL 0 Not Taking   diclofenac  Sodium (VOLTAREN ) 1 % GEL Apply 2 g topically 4 (four) times daily. (Patient not taking: Reported on 04/14/2024)   Not Taking   escitalopram  (LEXAPRO ) 10 MG tablet Take 10 mg by mouth daily. (Patient not taking: Reported on 04/14/2024)   Not Taking   famotidine  (PEPCID ) 20 MG tablet Take 20 mg by mouth 2 (two) times daily. (Patient not taking: Reported on 04/14/2024)   Not Taking   furosemide  (LASIX ) 20 MG tablet Take 20 mg by mouth daily. (Patient not taking: Reported on 04/14/2024)   Not Taking   Glecaprevir -Pibrentasvir  (MAVYRET ) 100-40 MG TABS Take 3 tablets by mouth daily. (Patient not taking: Reported on 04/14/2024)   Not Taking   iron  polysaccharides (NIFEREX) 150 MG capsule Take 1 capsule (150 mg total) by mouth daily. (Patient not taking: Reported on 04/14/2024) 30 capsule 2 Not Taking   loratadine  (CLARITIN ) 10 MG tablet Take 1 tablet (10 mg total) by mouth daily as needed for allergies or rhinitis. (Patient not taking: Reported on 04/14/2024) 30 tablet 2 Not Taking   Tiotropium Bromide Monohydrate (SPIRIVA RESPIMAT) 2.5 MCG/ACT AERS Inhale 2 puffs into the lungs daily. (Patient not taking: Reported on 04/14/2024)   Not Taking   Social History   Socioeconomic History   Marital status: Single    Spouse name: Not on file   Number of children: Not on file   Years of education: Not on file   Highest education level: Not on file  Occupational History   Not on file  Tobacco Use   Smoking status: Former    Types: Cigarettes    Start date: 2023    Quit date: 12/04/1993    Years since quitting: 30.3   Smokeless tobacco: Never  Vaping Use   Vaping status: Never Used  Substance and Sexual Activity   Alcohol use: Never   Drug use: Never   Sexual activity: Not Currently  Other Topics Concern    Not on file  Social History Narrative   ** Merged History Encounter **       Social Drivers of Health   Financial Resource Strain: Not on file  Food Insecurity: No Food Insecurity (04/15/2024)   Hunger Vital Sign    Worried About Running Out of Food in the Last Year: Never true    Ran Out of Food in the Last Year: Never true  Transportation Needs: No Transportation Needs (04/15/2024)   PRAPARE - Administrator, Civil Service (Medical): No    Lack of Transportation (Non-Medical): No  Physical Activity: Not on file  Stress: Not on file  Social Connections: Not on file  Intimate Partner Violence: Not At Risk (04/15/2024)   Humiliation, Afraid, Rape, and Kick questionnaire  Fear of Current or Ex-Partner: No    Emotionally Abused: No    Physically Abused: No    Sexually Abused: No    Family History  Problem Relation Age of Onset   Alzheimer's disease Mother    Heart disease Father      Vitals:   04/15/24 1949 04/15/24 2042 04/16/24 0315 04/16/24 0350  BP: (!) 160/53  (!) 145/49 (!) 153/53  Pulse: 84  70 66  Resp: 18  16 18   Temp: 98.1 F (36.7 C)  98.9 F (37.2 C) 97.9 F (36.6 C)  TempSrc: Oral     SpO2: 97% 98% 98% 99%  Weight:      Height:        PHYSICAL EXAM General: Chronically ill appearing female, well nourished, in no acute distress. HEENT: Normocephalic and atraumatic. Neck: No JVD.   Lungs: Normal respiratory effort on 2L (home 2-3L PRN). Diminished breath sounds bilaterally.  Heart: HRRR. Normal S1 and S2 without gallops or murmurs.  Abdomen: Non-distended appearing.  Msk: Normal strength and tone for age. Extremities: Warm and well perfused. No clubbing, cyanosis, edema.  Neuro: Alert and oriented X 3. Psych: Answers questions appropriately.   Labs: Basic Metabolic Panel: Recent Labs    04/15/24 0622 04/16/24 0439  NA 143 141  K 4.2 4.1  CL 104 99  CO2 29 32  GLUCOSE 231* 169*  BUN 16 25*  CREATININE 0.69 0.82  CALCIUM  8.6* 9.1    Liver Function Tests: Recent Labs    04/14/24 2005  AST 20  ALT 12  ALKPHOS 99  BILITOT 0.5  PROT 7.1  ALBUMIN 4.2   No results for input(s): LIPASE, AMYLASE in the last 72 hours. CBC: Recent Labs    04/14/24 2005 04/15/24 0622 04/16/24 0439  WBC 12.2* 6.7 13.2*  NEUTROABS 6.4  --   --   HGB 13.1 12.3 12.0  HCT 42.7 38.2 38.4  MCV 89.0 87.0 86.5  PLT 305 223 234   Cardiac Enzymes: Recent Labs    04/14/24 2005 04/14/24 2209  TROPONINIHS 18* 71*   BNP: Recent Labs    04/14/24 2005  BNP 720.4*   D-Dimer: No results for input(s): DDIMER in the last 72 hours. Hemoglobin A1C: No results for input(s): HGBA1C in the last 72 hours. Fasting Lipid Panel: No results for input(s): CHOL, HDL, LDLCALC, TRIG, CHOLHDL, LDLDIRECT in the last 72 hours. Thyroid  Function Tests: No results for input(s): TSH, T4TOTAL, T3FREE, THYROIDAB in the last 72 hours.  Invalid input(s): FREET3 Anemia Panel: No results for input(s): VITAMINB12, FOLATE, FERRITIN, TIBC, IRON , RETICCTPCT in the last 72 hours.   Radiology: DG Chest Portable 1 View Result Date: 04/14/2024 CLINICAL DATA:  Shortness of breath EXAM: PORTABLE CHEST 1 VIEW COMPARISON:  12/05/2023 FINDINGS: The heart size and mediastinal contours are within normal limits. Diffuse interstitial opacity superimposed upon emphysema. The visualized skeletal structures are unremarkable. IMPRESSION: Diffuse interstitial opacity superimposed upon emphysema, consistent with edema or atypical/viral infection. No focal airspace opacity. Electronically Signed   By: Marolyn JONETTA Jaksch M.D.   On: 04/14/2024 20:25    ECHO 10/17/2023  1. Left ventricular ejection fraction, by estimation, is 55 to 60%. The left ventricle has normal function. The left ventricle has no regional wall motion abnormalities. Left ventricular diastolic parameters are consistent with Grade I diastolic dysfunction (impaired relaxation).    2. Right ventricular systolic function is normal. The right ventricular size is normal. There is mildly elevated pulmonary artery systolic pressure.  3. The mitral valve is abnormal. Moderate mitral valve regurgitation. No evidence of mitral stenosis.   4. The aortic valve was not well visualized. Aortic valve regurgitation is moderate. No aortic stenosis is present.   5. The inferior vena cava is dilated in size with <50% respiratory variability, suggesting right atrial pressure of 15 mmHg.   TELEMETRY reviewed by me 04/16/2024: sinus rhythm, rate 60s  EKG reviewed by me: sinus tachycardia, rate 110 bpm  Data reviewed by me 04/16/2024: last 24h vitals tele labs imaging I/O hospitalist progress notes.  Principal Problem:   Acute respiratory failure with hypoxia and hypercarbia (HCC) Active Problems:   COPD with acute exacerbation (HCC)   COPD exacerbation (HCC)   Dyslipidemia   Peripheral neuropathy   Hypertensive urgency   Acute on chronic diastolic CHF (congestive heart failure) (HCC)    ASSESSMENT AND PLAN:  Cindy Gutierrez is a 63 y.o. female  with a past medical history of chronic HFpEF, coronary artery disease, mitral regurgitation, hypertension, COPD, tobacco use, depression, hepatitis C  who presented to the ED on 04/14/2024 for acute onset acute respiratory distress with worsening dyspnea, lower extremity edema. Cardiology was consulted for further evaluation.   # Acute on chronic HFpEF # COPD exacerbation  # Acute respiratory failure with hypoxia Patient presents with SOB, LEE likely multifactorial. BNP elevated at 720. CXR with pulmonary vascular congestion. Echo from 10/2023 with pEF (55-60%), grade I diastolic dysfunction, moderate MR. Patient states she has mold exposure at home and this causes COPD exacerbation. Patient reports SOB, chest tightness improves after breathing TX. -Monitor and replenish electrolytes for a goal K >4, Mag >2  -Transition IV to PO lasix  40 mg daily  tomorrow. -Continue farxiga  10 mg daily (Copay $4) -Continuspironolactone  25 mg daily.  -COPD management per primary team.   # Coronary artery disease # Hypertension Trops minimally elevated at 18 > 71. EKG with sinus tachycardia, rate 110 bpm without acute ischemic changes. Of note patient has documented allergy to lisinopril, losartan  and coreg, patient states she doesn't remember her reaction is to these medications.  -Continue rosuvastatin  20 mg daily.  -Continue amlodipine  to 5 mg daily. -Spironolactone  as stated above.  -Ordered hydrochlorothiazide  12.5 mg daily.  -BP medications are limited to documented allergies. -Minimally elevated trops in setting of acute respiratory failure, AoCHFpEF, COPD exacerbation is most consistent with demand/supply mismatch and not ACS.   This patient's plan of care was discussed and created with Dr. Florencio and he is in agreement.  Signed: Dorene Comfort, PA-C  04/16/2024, 6:43 AM [facility] Cardiology

## 2024-04-16 NOTE — Progress Notes (Signed)
 Occupational Therapy Treatment Patient Details Name: Cindy Gutierrez MRN: [id number] DOB: 05/01/1961 Today's Date: 04/16/2024   History of present illness Pt is a 63 y.o. female who presented to the ED with acute onset of acute respiratory distress with worsening dyspnea that started this evening. Admitted with acute respiratory failure with hypoxia and hypercarbia, COPD with acute exacerbation, hypertensive urgency. PMH of COPD, CHF, coronary artery disease, depression, hepatitis C, hyperparathyroidism, hypertension, mitral valve regurgitation, and pulmonary hypertension.   OT comments  Pt is supine in bed on arrival. Easily arousable and agreeable to OT session. She denies pain, but reports she does not feel like getting up or performing ADLs at this time. Session focused on education regarding use of AE/AD/DME for task simplification and utilization/implementation of ECS, PLB, and pacing to maximize her IND and prevent overexertion. Pt verbalized understanding and denies further need for OT services. OT to sign off in house with no further OT needs.      If plan is discharge home, recommend the following:      Equipment Recommendations  Other (comment) (handheld shower head)    Recommendations for Other Services      Precautions / Restrictions Precautions Precautions: None Recall of Precautions/Restrictions: Intact Restrictions Weight Bearing Restrictions Per Provider Order: No       Mobility Bed Mobility                    Transfers                         Balance Overall balance assessment: Modified Independent                                         ADL either performed or assessed with clinical judgement   ADL Overall ADL's : Modified independent                                       General ADL Comments: pt was supervision for ADLs yesterday, reports she has been going to the bathroom on her own since moving to the  floor without issues    Extremity/Trunk Assessment              Vision       Perception     Praxis     Communication Communication Communication: No apparent difficulties   Cognition Arousal: Alert Behavior During Therapy: WFL for tasks assessed/performed                                 Following commands: Intact        Cueing   Cueing Techniques: Verbal cues  Exercises Other Exercises Other Exercises: Spent time edu on AE/AD/DME use for task simplification and ECS, PLB, and pacing to maximize her IND and prevent overexertion. Pt verbalized understanding and denies further need for OT services.    Shoulder Instructions       General Comments      Pertinent Vitals/ Pain       Pain Assessment Pain Assessment: No/denies pain  Home Living  Prior Functioning/Environment              Frequency           Progress Toward Goals  OT Goals(current goals can now be found in the care plan section)  Progress towards OT goals: Goals met/education completed, patient discharged from OT     Plan      Co-evaluation                 AM-PAC OT 6 Clicks Daily Activity     Outcome Measure   Help from another person eating meals?: None Help from another person taking care of personal grooming?: None Help from another person toileting, which includes using toliet, bedpan, or urinal?: None Help from another person bathing (including washing, rinsing, drying)?: None Help from another person to put on and taking off regular upper body clothing?: None Help from another person to put on and taking off regular lower body clothing?: None 6 Click Score: 24    End of Session Equipment Utilized During Treatment: Oxygen  OT Visit Diagnosis: Other abnormalities of gait and mobility (R26.89)   Activity Tolerance Patient tolerated treatment well   Patient Left in bed;with call bell/phone  within reach;with bed alarm set   Nurse Communication Mobility status        Time: 8451-8442 OT Time Calculation (min): 9 min  Charges: OT General Charges $OT Visit: 1 Visit OT Treatments $Self Care/Home Management : 8-22 mins  Kaycen Whitworth, OTR/L  04/16/24, 4:06 PM   Adellyn Capek E Myan Locatelli 04/16/2024, 4:05 PM

## 2024-04-16 NOTE — Plan of Care (Addendum)
 Pt is alert and oriented x 4. Up with assist. Shortness of breath with exertion. Oxygen at 2L/[place]. Pt complaining of pain and neuropathy in arms and hands requesting home Celebrex  ordered be restarted. Tylenol  given until order received. Dr. Lawence contacted and home dose ordered. Vitals stable.  Problem: Education: Goal: Knowledge of General Education information will improve Description: Including pain rating scale, medication(s)/side effects and non-pharmacologic comfort measures Outcome: Progressing   Problem: Health Behavior/Discharge Planning: Goal: Ability to manage health-related needs will improve Outcome: Progressing   Problem: Clinical Measurements: Goal: Ability to maintain clinical measurements within normal limits will improve Outcome: Progressing Goal: Will remain free from infection Outcome: Progressing Goal: Diagnostic test results will improve Outcome: Progressing Goal: Respiratory complications will improve Outcome: Progressing Goal: Cardiovascular complication will be avoided Outcome: Progressing   Problem: Activity: Goal: Risk for activity intolerance will decrease Outcome: Progressing   Problem: Nutrition: Goal: Adequate nutrition will be maintained Outcome: Progressing   Problem: Coping: Goal: Level of anxiety will decrease Outcome: Progressing   Problem: Elimination: Goal: Will not experience complications related to bowel motility Outcome: Progressing Goal: Will not experience complications related to urinary retention Outcome: Progressing   Problem: Pain Managment: Goal: General experience of comfort will improve and/or be controlled Outcome: Progressing   Problem: Safety: Goal: Ability to remain free from injury will improve Outcome: Progressing   Problem: Skin Integrity: Goal: Risk for impaired skin integrity will decrease Outcome: Progressing   Problem: Education: Goal: Knowledge of disease or condition will improve Outcome:  Progressing Goal: Knowledge of the prescribed therapeutic regimen will improve Outcome: Progressing Goal: Individualized Educational Video(s) Outcome: Progressing   Problem: Activity: Goal: Ability to tolerate increased activity will improve Outcome: Progressing Goal: Will verbalize the importance of balancing activity with adequate rest periods Outcome: Progressing   Problem: Respiratory: Goal: Ability to maintain a clear airway will improve Outcome: Progressing Goal: Levels of oxygenation will improve Outcome: Progressing Goal: Ability to maintain adequate ventilation will improve Outcome: Progressing

## 2024-04-16 NOTE — Progress Notes (Signed)
 Nutrition Brief Note  RD consulted for assessment of nutritional requirements/ status.   Wt Readings from Last 15 Encounters:  04/14/24 81 kg  12/05/23 81 kg  11/18/23 81.6 kg  10/19/23 82.1 kg  09/18/23 87.6 kg  06/19/23 81.6 kg  09/23/22 74.4 kg  09/10/22 74.8 kg  09/08/22 75 kg  07/15/21 78.9 kg  05/23/21 79.4 kg  03/29/21 79.4 kg  10/07/20 78.4 kg  09/21/20 72.6 kg  08/28/20 72.6 kg   Pt with medical history significant for COPD, CHF, coronary artery disease, depression, hepatitis C, hyperparathyroidism, hypertension, mitral valve regurgitation, and pulmonary hypertension, who presented with acute onset of acute respiratory distress with worsening dyspnea.   Pt admitted with acute respiratory failure secondary to CHF and COPD.   Reviewed I/O's: +240 ml x 24 hours and -560 ml since admission  Spoke with pt at bedside, who reports feeling better today. She shares that she is hungry and is awaiting for breakfast to arrive. Pt reports she has a great appetite PTA and eats all the time at home. Pt patted her stomach and reports that she has been gaining weight. Reviewed wt hx; wt has been stable over the past 6 months.   Pt denies any questions or concerns regarding nutrition. RD provided pt with a set of towels per her request.   Flowsheet Row Most Recent Value  Orbital Region No depletion  Upper Arm Region No depletion  Thoracic and Lumbar Region No depletion  Buccal Region No depletion  Temple Region No depletion  Clavicle Bone Region No depletion  Clavicle and Acromion Bone Region No depletion  Scapular Bone Region No depletion  Dorsal Hand No depletion  Patellar Region No depletion  Anterior Thigh Region No depletion  Posterior Calf Region No depletion  Edema (RD Assessment) Mild  Hair Reviewed  Eyes Reviewed  Mouth Reviewed  Skin Reviewed  Nails Reviewed    Current diet order is regular diet with 1.8 L fluid restriction, patient is consuming approximately  100% of meals at this time. Labs and medications reviewed.   No nutrition interventions warranted at this time. If nutrition issues arise, please consult RD.   Margery ORN, RD, LDN, CDCES Registered Dietitian III Certified Diabetes Care and Education Specialist If unable to reach this RD, please use RD Inpatient group chat on secure chat between hours of 8am-4 pm daily

## 2024-04-16 NOTE — Progress Notes (Signed)
 PROGRESS NOTE    Cindy Gutierrez  FMW:[id number] DOB: May 14, 1961 DOA: 04/14/2024 [facility]    Brief Narrative:   63 y.o. female with medical history significant for COPD, CHF, coronary artery disease, depression, hepatitis C, hyperparathyroidism, hypertension, mitral valve regurgitation, and pulmonary hypertension, who presented to the emergency room with acute onset of acute respiratory distress with worsening dyspnea that started this evening.  She has not been able to tolerate CPAP that was placed by EMS.  She has associated cough or wheezing.  No fever or chills.  No nausea or vomiting or abdominal pain.  No dysuria, oliguria or hematuria or flank pain.    Assessment & Plan:   Principal Problem:   Acute respiratory failure with hypoxia and hypercarbia (HCC) Active Problems:   COPD exacerbation (HCC)   COPD with acute exacerbation (HCC)   Hypertensive urgency   Dyslipidemia   Peripheral neuropathy   Acute on chronic diastolic CHF (congestive heart failure) (HCC)   * Acute respiratory failure with hypoxia and hypercarbia (HCC) Multifactorial secondary to decompensated heart failure and COPD I suspect that COPD is large and driving force to hypoxia rather than congestive heart failure though patient is in a fluid overloaded state.  Patient attributes her respiratory compromise to mold conditions in her apartment Plan: IV steroids P.o. Lasix  Continue aggressive bronchodilator Strict I's and outs Daily weights Cardiology consulted on admission      COPD with acute exacerbation (HCC) As above     Hypertensive urgency Improved.  Continue antihypertensives and oral Lasix   Dyslipidemia Continue statin   Peripheral neuropathy Continue Lyrica      DVT prophylaxis: SQ Lovenox  Code Status: Full Family Communication: None today Disposition Plan: Status is: Inpatient Remains inpatient appropriate because: Respiratory failure, decompensated COPD,  decompensated heart failure   Level of care: Telemetry Medical  Consultants:  Cardiology-[facility]  Procedures:  None  Antimicrobials: None   Subjective: Seen and examined.  Very tearful this morning.  Reports mold in her apartment.  Objective: Vitals:   04/15/24 2042 04/16/24 0315 04/16/24 0350 04/16/24 0719  BP:  (!) 145/49 (!) 153/53 (!) 168/58  Pulse:  70 66 66  Resp:  16 18 18   Temp:  98.9 F (37.2 C) 97.9 F (36.6 C) 98.1 F (36.7 C)  TempSrc:    Oral  SpO2: 98% 98% 99% 98%  Weight:      Height:        Intake/Output Summary (Last 24 hours) at 04/16/2024 1327 Last data filed at 04/16/2024 1031 Gross per 24 hour  Intake 480 ml  Output --  Net 480 ml   Filed Weights   04/14/24 2002  Weight: 81 kg    Examination:  General exam: Tearful Respiratory system: Improved air entry.  Mild scattered wheeze.  Normal work of breathing.  2 L Cardiovascular system: S1-S2, RRR, no murmurs, 1+ pedal edema Gastrointestinal system: Soft, NT/ND, normal bowel sounds Central nervous system: Alert and oriented. No focal neurological deficits. Extremities: Symmetric 5 x 5 power. Skin: No rashes, lesions or ulcers Psychiatry: Judgement and insight appear normal. Mood & affect tearful.     Data Reviewed: I have personally reviewed following labs and imaging studies  CBC: Recent Labs  Lab 04/14/24 2005 04/15/24 0622 04/16/24 0439  WBC 12.2* 6.7 13.2*  NEUTROABS 6.4  --   --   HGB 13.1 12.3 12.0  HCT 42.7 38.2 38.4  MCV 89.0 87.0 86.5  PLT 305 223 234   Basic Metabolic Panel: Recent Labs  Lab 04/14/24 2005 04/15/24 0622 04/16/24 0439  NA 144 143 141  K 4.4 4.2 4.1  CL 108 104 99  CO2 30 29 32  GLUCOSE 118* 231* 169*  BUN 17 16 25*  CREATININE 0.63 0.69 0.82  CALCIUM  9.0 8.6* 9.1   GFR: Estimated Creatinine Clearance: 77.4 mL/min (by C-G formula based on SCr of 0.82 mg/dL). Liver Function Tests: Recent Labs  Lab 04/14/24 2005  AST 20  ALT 12  ALKPHOS 99   BILITOT 0.5  PROT 7.1  ALBUMIN 4.2   No results for input(s): LIPASE, AMYLASE in the last 168 hours. No results for input(s): AMMONIA in the last 168 hours. Coagulation Profile: No results for input(s): INR, PROTIME in the last 168 hours. Cardiac Enzymes: No results for input(s): CKTOTAL, CKMB, CKMBINDEX, TROPONINI in the last 168 hours. BNP (last 3 results) No results for input(s): PROBNP in the last 8760 hours. HbA1C: No results for input(s): HGBA1C in the last 72 hours. CBG: No results for input(s): GLUCAP in the last 168 hours. Lipid Profile: No results for input(s): CHOL, HDL, LDLCALC, TRIG, CHOLHDL, LDLDIRECT in the last 72 hours. Thyroid  Function Tests: No results for input(s): TSH, T4TOTAL, FREET4, T3FREE, THYROIDAB in the last 72 hours. Anemia Panel: No results for input(s): VITAMINB12, FOLATE, FERRITIN, TIBC, IRON , RETICCTPCT in the last 72 hours. Sepsis Labs: Recent Labs  Lab 04/14/24 2209  PROCALCITON 0.37    Recent Results (from the past 240 hours)  Resp panel by RT-PCR (RSV, Flu A&B, Covid) Anterior Nasal Swab     Status: None   Collection Time: 04/14/24  8:04 PM   Specimen: Anterior Nasal Swab  Result Value Ref Range Status   SARS Coronavirus 2 by RT PCR NEGATIVE NEGATIVE Final    Comment: (NOTE) SARS-CoV-2 target nucleic acids are NOT DETECTED.  The SARS-CoV-2 RNA is generally detectable in upper respiratory specimens during the acute phase of infection. The lowest concentration of SARS-CoV-2 viral copies this assay can detect is 138 copies/mL. A negative result does not preclude SARS-Cov-2 infection and should not be used as the sole basis for treatment or other patient management decisions. A negative result may occur with  improper specimen collection/handling, submission of specimen other than nasopharyngeal swab, presence of viral mutation(s) within the areas targeted by this assay, and  inadequate number of viral copies(<138 copies/mL). A negative result must be combined with clinical observations, patient history, and epidemiological information. The expected result is Negative.  Fact Sheet for Patients:  BloggerCourse.com  Fact Sheet for Healthcare Providers:  SeriousBroker.it  This test is no t yet approved or cleared by the [place]  FDA and  has been authorized for detection and/or diagnosis of SARS-CoV-2 by FDA under an Emergency Use Authorization (EUA). This EUA will remain  in effect (meaning this test can be used) for the duration of the COVID-19 declaration under Section 564(b)(1) of the Act, 21 U.S.C.section 360bbb-3(b)(1), unless the authorization is terminated  or revoked sooner.       Influenza A by PCR NEGATIVE NEGATIVE Final   Influenza B by PCR NEGATIVE NEGATIVE Final    Comment: (NOTE) The Xpert Xpress SARS-CoV-2/FLU/RSV plus assay is intended as an aid in the diagnosis of influenza from Nasopharyngeal swab specimens and should not be used as a sole basis for treatment. Nasal washings and aspirates are unacceptable for Xpert Xpress SARS-CoV-2/FLU/RSV testing.  Fact Sheet for Patients: BloggerCourse.com  Fact Sheet for Healthcare Providers: SeriousBroker.it  This test is not yet approved or cleared  by the [place]  FDA and has been authorized for detection and/or diagnosis of SARS-CoV-2 by FDA under an Emergency Use Authorization (EUA). This EUA will remain in effect (meaning this test can be used) for the duration of the COVID-19 declaration under Section 564(b)(1) of the Act, 21 U.S.C. section 360bbb-3(b)(1), unless the authorization is terminated or revoked.     Resp Syncytial Virus by PCR NEGATIVE NEGATIVE Final    Comment: (NOTE) Fact Sheet for Patients: BloggerCourse.com  Fact Sheet for Healthcare  Providers: SeriousBroker.it  This test is not yet approved or cleared by the [place]  FDA and has been authorized for detection and/or diagnosis of SARS-CoV-2 by FDA under an Emergency Use Authorization (EUA). This EUA will remain in effect (meaning this test can be used) for the duration of the COVID-19 declaration under Section 564(b)(1) of the Act, 21 U.S.C. section 360bbb-3(b)(1), unless the authorization is terminated or revoked.  Performed at [facility], [address] [place], [place] [address]          Radiology Studies: DG Chest Portable 1 View Result Date: 04/14/2024 CLINICAL DATA:  Shortness of breath EXAM: PORTABLE CHEST 1 VIEW COMPARISON:  12/05/2023 FINDINGS: The heart size and mediastinal contours are within normal limits. Diffuse interstitial opacity superimposed upon emphysema. The visualized skeletal structures are unremarkable. IMPRESSION: Diffuse interstitial opacity superimposed upon emphysema, consistent with edema or atypical/viral infection. No focal airspace opacity. Electronically Signed   By: Marolyn JONETTA Jaksch M.D.   On: 04/14/2024 20:25        Scheduled Meds:  amLODipine   5 mg Oral Daily   arformoterol   15 mcg Nebulization BID   calcium  carbonate  1 tablet Oral Daily   celecoxib   200 mg Oral BID   dapagliflozin  propanediol  10 mg Oral Daily   enoxaparin  (LOVENOX ) injection  40 mg Subcutaneous Q24H   [START ON 04/17/2024] furosemide   40 mg Oral Daily   hydrochlorothiazide   12.5 mg Oral Daily   ipratropium-albuterol   3 mL Nebulization Q6H   methylPREDNISolone  (SOLU-MEDROL ) injection  40 mg Intravenous Q12H   pregabalin   150 mg Oral BID   rosuvastatin   20 mg Oral Daily   spironolactone   25 mg Oral Daily   Continuous Infusions:  cefTRIAXone  (ROCEPHIN )  IV 1 g (04/15/24 2135)     LOS: 2 days     Calvin KATHEE Robson, MD [facility]   If 7PM-7AM, please contact night-coverage  04/16/2024, 1:27 PM

## 2024-04-17 DIAGNOSIS — I5033 Acute on chronic diastolic (congestive) heart failure: Secondary | ICD-10-CM | POA: Diagnosis not present

## 2024-04-17 DIAGNOSIS — I160 Hypertensive urgency: Secondary | ICD-10-CM | POA: Diagnosis not present

## 2024-04-17 DIAGNOSIS — E785 Hyperlipidemia, unspecified: Secondary | ICD-10-CM

## 2024-04-17 DIAGNOSIS — J441 Chronic obstructive pulmonary disease with (acute) exacerbation: Secondary | ICD-10-CM | POA: Diagnosis not present

## 2024-04-17 DIAGNOSIS — J9601 Acute respiratory failure with hypoxia: Secondary | ICD-10-CM | POA: Diagnosis not present

## 2024-04-17 DIAGNOSIS — G629 Polyneuropathy, unspecified: Secondary | ICD-10-CM

## 2024-04-17 DIAGNOSIS — E663 Overweight: Secondary | ICD-10-CM

## 2024-04-17 LAB — CBC
HCT: 37.2 % (ref 36.0–46.0)
Hemoglobin: 11.7 g/dL — ABNORMAL LOW (ref 12.0–15.0)
MCH: 27.3 pg (ref 26.0–34.0)
MCHC: 31.5 g/dL (ref 30.0–36.0)
MCV: 86.7 fL (ref 80.0–100.0)
Platelets: 234 K/uL (ref 150–400)
RBC: 4.29 MIL/uL (ref 3.87–5.11)
RDW: 14.5 % (ref 11.5–15.5)
WBC: 13.5 K/uL — ABNORMAL HIGH (ref 4.0–10.5)
nRBC: 0 % (ref 0.0–0.2)

## 2024-04-17 LAB — BASIC METABOLIC PANEL WITH GFR
Anion gap: 10 (ref 5–15)
BUN: 32 mg/dL — ABNORMAL HIGH (ref 8–23)
CO2: 32 mmol/L (ref 22–32)
Calcium: 8.7 mg/dL — ABNORMAL LOW (ref 8.9–10.3)
Chloride: 101 mmol/L (ref 98–111)
Creatinine, Ser: 1.01 mg/dL — ABNORMAL HIGH (ref 0.44–1.00)
GFR, Estimated: >60 mL/min (ref >60)
Glucose, Bld: 150 mg/dL — ABNORMAL HIGH (ref 70–99)
Potassium: 4.2 mmol/L (ref 3.5–5.1)
Sodium: 143 mmol/L (ref 135–145)

## 2024-04-17 LAB — BRAIN NATRIURETIC PEPTIDE: B Natriuretic Peptide: 651.2 pg/mL — ABNORMAL HIGH (ref 0.0–100.0)

## 2024-04-17 NOTE — Progress Notes (Signed)
 Progress Note   Patient: Cindy Gutierrez FMW:[id number] DOB: 1960-12-02 DOA: 04/14/2024     3 DOS: the patient was seen and examined on 04/17/2024   Brief hospital course: 63 y.o. female with medical history significant for COPD, CHF, coronary artery disease, depression, hepatitis C, hyperparathyroidism, hypertension, mitral valve regurgitation, and pulmonary hypertension, who presented to the emergency room with acute onset of acute respiratory distress with worsening dyspnea that started this evening.  She has not been able to tolerate CPAP that was placed by EMS.  She has associated cough or wheezing.  No fever or chills.  No nausea or vomiting or abdominal pain.  No dysuria, oliguria or hematuria or flank pain   7/16.  Patient tearful that she has mold in her apartment and does not have the resources to move.  Feels that her breathing is better.  Cleared by cardiology to be discharged.  TOC to evaluate.  Assessment and Plan: * Acute respiratory failure with hypoxia and hypercarbia (HCC) - This is likely multifactorial due to acute on chronic diastolic CHF and COPD exacerbation. Patient chronically wears 2 L of oxygen at home and initially came in with respiratory distress requiring BiPAP.   Acute on chronic diastolic CHF (congestive heart failure) (HCC) Holding Lasix  today with creatinine rise.  Continue Farxiga , Aldactone .  Limited with other medications secondary to bronchospasm (no beta-blocker) and allergies (to lisinopril, Coreg and losartan )  COPD with acute exacerbation (HCC) Continue Solu-Medrol  and nebulizer treatments.  Hypertensive urgency Blood pressure much improved  Dyslipidemia On Crestor   Overweight (BMI 25.0-29.9) BMI 27.15  Peripheral neuropathy On Lyrica .        Subjective: Patient very tearful that she has mold in her house.  Feels a little bit better with regards to her breathing.  Physical Exam: Vitals:   04/17/24 0305 04/17/24 0500 04/17/24 0700  04/17/24 0857  BP: (!) 161/58 (!) 128/41  (!) 131/54  Pulse: 62 (!) 52  (!) 59  Resp: 17   15  Temp:    (!) 97.5 F (36.4 C)  TempSrc:    Oral  SpO2: 100%  99% 98%  Weight:      Height:       Physical Exam HENT:     Head: Normocephalic.  Eyes:     General: Lids are normal.     Conjunctiva/sclera: Conjunctivae normal.  Cardiovascular:     Rate and Rhythm: Normal rate and regular rhythm.     Heart sounds: Normal heart sounds, S1 normal and S2 normal.  Pulmonary:     Breath sounds: Examination of the right-lower field reveals decreased breath sounds. Examination of the left-lower field reveals decreased breath sounds. Decreased breath sounds present. No wheezing, rhonchi or rales.  Abdominal:     Palpations: Abdomen is soft.     Tenderness: There is no abdominal tenderness.  Musculoskeletal:     Right lower leg: No swelling.     Left lower leg: No swelling.  Skin:    General: Skin is warm.     Findings: No rash.  Neurological:     Mental Status: She is alert and oriented to person, place, and time.     Data Reviewed: Creatinine 1.01, BNP 651.2, white blood cell count 13.5, hemoglobin 11.7, platelet count 234 Family Communication: Declined  Disposition: Status is: Inpatient Remains inpatient appropriate because: Patient very tearful about going home today.  She states she has mold in her apartment.  She states that she does not have to have any  other resources to move.  Transitional care team to speak with her today.  I wrote a doctor's note for the apartment facility to address the mold.  Planned Discharge Destination: Home with Home Health    Time spent: 28 minutes  Author: Charlie Patterson, MD 04/17/2024 3:29 PM  For on call review [email or web address].

## 2024-04-17 NOTE — Hospital Course (Signed)
 63 y.o. female with medical history significant for COPD, CHF, coronary artery disease, depression, hepatitis C, hyperparathyroidism, hypertension, mitral valve regurgitation, and pulmonary hypertension, who presented to the emergency room with acute onset of acute respiratory distress with worsening dyspnea that started this evening.  She has not been able to tolerate CPAP that was placed by EMS.  She has associated cough or wheezing.  No fever or chills.  No nausea or vomiting or abdominal pain.  No dysuria, oliguria or hematuria or flank pain   7/16.  Patient tearful that she has mold in her apartment and does not have the resources to move.  Feels that her breathing is better.  Cleared by cardiology to be discharged.  TOC to evaluate. 7/17.  Patient's breathing better this morning.

## 2024-04-17 NOTE — Assessment & Plan Note (Signed)
 Holding Lasix  today with creatinine rise.  Continue Farxiga , Aldactone .  Limited with other medications secondary to bronchospasm (no beta-blocker) and allergies (to lisinopril, Coreg and losartan )

## 2024-04-17 NOTE — Plan of Care (Signed)
  Problem: Education: Goal: Knowledge of General Education information will improve Description: Including pain rating scale, medication(s)/side effects and non-pharmacologic comfort measures Outcome: Progressing   Problem: Clinical Measurements: Goal: Respiratory complications will improve Outcome: Progressing   Problem: Activity: Goal: Risk for activity intolerance will decrease Outcome: Progressing   Problem: Nutrition: Goal: Adequate nutrition will be maintained Outcome: Progressing   Problem: Coping: Goal: Level of anxiety will decrease Outcome: Progressing   Problem: Safety: Goal: Ability to remain free from injury will improve Outcome: Progressing

## 2024-04-17 NOTE — Assessment & Plan Note (Signed)
BMI 27.15.

## 2024-04-17 NOTE — Progress Notes (Signed)
 [facility] CLINIC CARDIOLOGY PROGRESS NOTE       Patient ID: Cindy Gutierrez MRN: [id number] DOB/AGE: [birth year] 63 y.o.  Admit date: 04/14/2024 Referring Physician Dr. Lawence Primary Physician Administration, [facility] Cardiologist Dr. Paraschos/Dr. Deretha (2019), requests to follow with Dr. Florencio Reason for Consultation AoCHpEF  HPI: Cindy Gutierrez is a 63 y.o. female  with a past medical history of chronic HFpEF, coronary artery disease, mitral regurgitation, pulm hypertension, COPD, hypertension, tobacco use, depression, hepatitis C  who presented to the ED on 04/14/2024 for acute onset acute respiratory distress with worsening dyspnea, lower extremity edema. Cardiology was consulted for further evaluation.   Interval History: -Patient seen and examined this AM and laying comfortably in hospital bed. Patient states she her Sob seem to be improving slightly but not at baseline. Denies chest tightness this AM.  -Patient states breathing TX has helped her sxs.  -Patient states she's been walking to bathroom and getting out of bed and states her SOB feels okay with exertion as long as she's on O2. -Patients BP improving and HR stable this AM. Overnight Tele showed no significant events.  -Cr bumped. ReDs reading this AM 24%, indicating patient is not volume overloaded.  -Patient remains on 2L with stable SpO2. (Patient states she uses 2-3L PRN with exertion.)   Review of systems complete and found to be negative unless listed above    Past Medical History:  Diagnosis Date   Aortic valve regurgitation    CHF (congestive heart failure) (HCC)    COPD (chronic obstructive pulmonary disease) (HCC)    Coronary artery disease    Depression    Hepatitis C    Hyperparathyroidism (HCC)    Hypertension    Mitral valve regurgitation    Neuropathy    to R arm only   Non-toxic multinodular goiter    Pulmonary hypertension (HCC) 2022   noted on CT   Pulmonary nodule    Thyroid  disease      Past Surgical History:  Procedure Laterality Date   COLONOSCOPY WITH PROPOFOL  N/A 08/21/2018   Procedure: COLONOSCOPY WITH PROPOFOL ;  Surgeon: Unk Corinn Skiff, MD;  Location: [facility] ENDOSCOPY;  Service: Gastroenterology;  Laterality: N/A;   PARATHYROIDECTOMY Left    TUBAL LIGATION      Medications Prior to Admission  Medication Sig Dispense Refill Last Dose/Taking   acetaminophen  (TYLENOL ) 325 MG tablet Take 650 mg by mouth 3 (three) times daily as needed for mild pain (pain score 1-3).   Taking As Needed   albuterol  (PROVENTIL ) (2.5 MG/3ML) 0.083% nebulizer solution Take 2.5 mg by nebulization every 4 (four) hours as needed for wheezing or shortness of breath.   Taking As Needed   albuterol  (VENTOLIN  HFA) 108 (90 Base) MCG/ACT inhaler Inhale 2 puffs into the lungs every 6 (six) hours as needed for wheezing or shortness of breath. 8 g 2 Taking As Needed   amLODipine  (NORVASC ) 10 MG tablet Take 1 tablet (10 mg total) by mouth daily. 30 tablet 1 Taking   budesonide -formoterol  (SYMBICORT ) 160-4.5 MCG/ACT inhaler Inhale 2 puffs into the lungs 2 (two) times daily.   Taking   calcium  carbonate (TUMS - DOSED IN MG ELEMENTAL CALCIUM ) 500 MG chewable tablet Chew 1 tablet by mouth daily. As needed   Taking   celecoxib  (CELEBREX ) 200 MG capsule Take 200 mg by mouth 2 (two) times daily.   Taking   pregabalin  (LYRICA ) 150 MG capsule Take 150 mg by mouth 2 (two) times daily.   Taking  rosuvastatin  (CRESTOR ) 20 MG tablet Take 20 mg by mouth daily.   Taking   aspirin  81 MG chewable tablet Chew 81 mg by mouth daily. (Patient not taking: Reported on 04/14/2024)   Not Taking   calcium  citrate (CALCITRATE - DOSED IN MG ELEMENTAL CALCIUM ) 950 (200 Ca) MG tablet Take 200 mg of elemental calcium  by mouth daily. (Patient not taking: Reported on 04/14/2024)   Not Taking   chlorthalidone  (HYGROTON ) 25 MG tablet Take 0.5 tablets (12.5 mg total) by mouth daily. (Patient not taking: Reported on 04/14/2024) 30 tablet 1  Not Taking   Dextromethorphan -guaiFENesin  20-200 MG/20ML LIQD Take 20 mLs by mouth every 8 (eight) hours as needed. (Patient not taking: Reported on 04/14/2024) 118 mL 0 Not Taking   diclofenac  Sodium (VOLTAREN ) 1 % GEL Apply 2 g topically 4 (four) times daily. (Patient not taking: Reported on 04/14/2024)   Not Taking   escitalopram  (LEXAPRO ) 10 MG tablet Take 10 mg by mouth daily. (Patient not taking: Reported on 04/14/2024)   Not Taking   famotidine  (PEPCID ) 20 MG tablet Take 20 mg by mouth 2 (two) times daily. (Patient not taking: Reported on 04/14/2024)   Not Taking   furosemide  (LASIX ) 20 MG tablet Take 20 mg by mouth daily. (Patient not taking: Reported on 04/14/2024)   Not Taking   Glecaprevir -Pibrentasvir  (MAVYRET ) 100-40 MG TABS Take 3 tablets by mouth daily. (Patient not taking: Reported on 04/14/2024)   Not Taking   iron  polysaccharides (NIFEREX) 150 MG capsule Take 1 capsule (150 mg total) by mouth daily. (Patient not taking: Reported on 04/14/2024) 30 capsule 2 Not Taking   loratadine  (CLARITIN ) 10 MG tablet Take 1 tablet (10 mg total) by mouth daily as needed for allergies or rhinitis. (Patient not taking: Reported on 04/14/2024) 30 tablet 2 Not Taking   Tiotropium Bromide Monohydrate (SPIRIVA RESPIMAT) 2.5 MCG/ACT AERS Inhale 2 puffs into the lungs daily. (Patient not taking: Reported on 04/14/2024)   Not Taking   Social History   Socioeconomic History   Marital status: Single    Spouse name: Not on file   Number of children: Not on file   Years of education: Not on file   Highest education level: Not on file  Occupational History   Not on file  Tobacco Use   Smoking status: Former    Types: Cigarettes    Start date: 2023    Quit date: 12/04/1993    Years since quitting: 30.3   Smokeless tobacco: Never  Vaping Use   Vaping status: Never Used  Substance and Sexual Activity   Alcohol use: Never   Drug use: Never   Sexual activity: Not Currently  Other Topics Concern   Not on file   Social History Narrative   ** Merged History Encounter **       Social Drivers of Health   Financial Resource Strain: Not on file  Food Insecurity: No Food Insecurity (04/15/2024)   Hunger Vital Sign    Worried About Running Out of Food in the Last Year: Never true    Ran Out of Food in the Last Year: Never true  Transportation Needs: No Transportation Needs (04/15/2024)   PRAPARE - Administrator, Civil Service (Medical): No    Lack of Transportation (Non-Medical): No  Physical Activity: Not on file  Stress: Not on file  Social Connections: Not on file  Intimate Partner Violence: Not At Risk (04/15/2024)   Humiliation, Afraid, Rape, and Kick questionnaire  Fear of Current or Ex-Partner: No    Emotionally Abused: No    Physically Abused: No    Sexually Abused: No    Family History  Problem Relation Age of Onset   Alzheimer's disease Mother    Heart disease Father      Vitals:   04/16/24 2017 04/17/24 0305 04/17/24 0500 04/17/24 0700  BP: (!) 159/51 (!) 161/58 (!) 128/41   Pulse: 74 62 (!) 52   Resp: 15 17    Temp: 98.9 F (37.2 C)     TempSrc:      SpO2: 99% 100%  99%  Weight:      Height:        PHYSICAL EXAM General: Chronically ill appearing female, well nourished, in no acute distress. HEENT: Normocephalic and atraumatic. Neck: No JVD.   Lungs: Normal respiratory effort on 2L (home 2-3L PRN). Diminished breath sounds bilaterally.  Heart: HRRR. Normal S1 and S2 without gallops or murmurs.  Abdomen: Non-distended appearing.  Msk: Normal strength and tone for age. Extremities: Warm and well perfused. No clubbing, cyanosis, edema.  Neuro: Alert and oriented X 3. Psych: Answers questions appropriately.   Labs: Basic Metabolic Panel: Recent Labs    04/16/24 0439 04/17/24 0421  NA 141 143  K 4.1 4.2  CL 99 101  CO2 32 32  GLUCOSE 169* 150*  BUN 25* 32*  CREATININE 0.82 1.01*  CALCIUM  9.1 8.7*   Liver Function Tests: Recent Labs     04/14/24 2005  AST 20  ALT 12  ALKPHOS 99  BILITOT 0.5  PROT 7.1  ALBUMIN 4.2   No results for input(s): LIPASE, AMYLASE in the last 72 hours. CBC: Recent Labs    04/14/24 2005 04/15/24 0622 04/16/24 0439 04/17/24 0421  WBC 12.2*   < > 13.2* 13.5*  NEUTROABS 6.4  --   --   --   HGB 13.1   < > 12.0 11.7*  HCT 42.7   < > 38.4 37.2  MCV 89.0   < > 86.5 86.7  PLT 305   < > 234 234   < > = values in this interval not displayed.   Cardiac Enzymes: Recent Labs    04/14/24 2005 04/14/24 2209  TROPONINIHS 18* 71*   BNP: Recent Labs    04/14/24 2005 04/17/24 0421  BNP 720.4* 651.2*   D-Dimer: No results for input(s): DDIMER in the last 72 hours. Hemoglobin A1C: No results for input(s): HGBA1C in the last 72 hours. Fasting Lipid Panel: No results for input(s): CHOL, HDL, LDLCALC, TRIG, CHOLHDL, LDLDIRECT in the last 72 hours. Thyroid  Function Tests: No results for input(s): TSH, T4TOTAL, T3FREE, THYROIDAB in the last 72 hours.  Invalid input(s): FREET3 Anemia Panel: No results for input(s): VITAMINB12, FOLATE, FERRITIN, TIBC, IRON , RETICCTPCT in the last 72 hours.   Radiology: DG Chest Portable 1 View Result Date: 04/14/2024 CLINICAL DATA:  Shortness of breath EXAM: PORTABLE CHEST 1 VIEW COMPARISON:  12/05/2023 FINDINGS: The heart size and mediastinal contours are within normal limits. Diffuse interstitial opacity superimposed upon emphysema. The visualized skeletal structures are unremarkable. IMPRESSION: Diffuse interstitial opacity superimposed upon emphysema, consistent with edema or atypical/viral infection. No focal airspace opacity. Electronically Signed   By: Marolyn JONETTA Jaksch M.D.   On: 04/14/2024 20:25    ECHO 10/17/2023  1. Left ventricular ejection fraction, by estimation, is 55 to 60%. The left ventricle has normal function. The left ventricle has no regional wall motion abnormalities. Left ventricular diastolic  parameters  are consistent with Grade I diastolic dysfunction (impaired relaxation).   2. Right ventricular systolic function is normal. The right ventricular size is normal. There is mildly elevated pulmonary artery systolic pressure.   3. The mitral valve is abnormal. Moderate mitral valve regurgitation. No evidence of mitral stenosis.   4. The aortic valve was not well visualized. Aortic valve regurgitation is moderate. No aortic stenosis is present.   5. The inferior vena cava is dilated in size with <50% respiratory variability, suggesting right atrial pressure of 15 mmHg.   TELEMETRY reviewed by me 04/17/2024: sinus rhythm, rate 50s  EKG reviewed by me: sinus tachycardia, rate 110 bpm  Data reviewed by me 04/17/2024: last 24h vitals tele labs imaging I/O hospitalist progress notes.  Principal Problem:   Acute respiratory failure with hypoxia and hypercarbia (HCC) Active Problems:   COPD with acute exacerbation (HCC)   COPD exacerbation (HCC)   Dyslipidemia   Peripheral neuropathy   Hypertensive urgency   Acute on chronic diastolic CHF (congestive heart failure) (HCC)    ASSESSMENT AND PLAN:  Cindy Gutierrez is a 63 y.o. female  with a past medical history of chronic HFpEF, coronary artery disease, mitral regurgitation, hypertension, COPD, tobacco use, depression, hepatitis C  who presented to the ED on 04/14/2024 for acute onset acute respiratory distress with worsening dyspnea, lower extremity edema. Cardiology was consulted for further evaluation.   # Acute on chronic HFpEF # COPD exacerbation  # Acute respiratory failure with hypoxia Patient presents with SOB, LEE likely multifactorial. BNP elevated at 720. CXR with pulmonary vascular congestion. Echo from 10/2023 with pEF (55-60%), grade I diastolic dysfunction, moderate MR. Patient states she has mold exposure at home and this causes COPD exacerbation. Patient reports SOB, chest tightness improves after breathing TX. -Monitor and  replenish electrolytes for a goal K >4, Mag >2  -Hold PO lasix  for a few days (ReDs reading 07/16 at 24%, indicating patient is not volume overloaded, sxs mainly from COPD exacerbation). -Continue farxiga  10 mg daily (Copay $4) -Continuspironolactone  25 mg daily.  -COPD management per primary team.   # Coronary artery disease # Hypertension Trops minimally elevated at 18 > 71. EKG with sinus tachycardia, rate 110 bpm without acute ischemic changes. Of note patient has documented allergy to lisinopril, losartan  and coreg, patient states she doesn't remember her reaction is to these medications. Patient states she felt chest tightness with hydrochlorothiazide  and had this before and states she doesn't want to take this anymore and requested that I update her allergy list.  -Continue rosuvastatin  20 mg daily.  -Continue amlodipine  to 5 mg daily. -Spironolactone  as stated above.  -Continue hydralazine  25 mg TID for BP control.  -BP medications are limited to documented allergies. -Minimally elevated trops in setting of acute respiratory failure, AoCHFpEF, COPD exacerbation is most consistent with demand/supply mismatch and not ACS.   No further cardiac recommendations at this time. Cardiology will sign off. Please haiku with questions or re-engage if needed.    This patient's plan of care was discussed and created with Dr. Florencio and he is in agreement.  Signed: Dorene Comfort, PA-C  04/17/2024, 8:21 AM [facility] Cardiology

## 2024-04-18 ENCOUNTER — Other Ambulatory Visit (HOSPITAL_COMMUNITY): Payer: Self-pay

## 2024-04-18 ENCOUNTER — Telehealth (HOSPITAL_COMMUNITY): Payer: Self-pay | Admitting: Pharmacy Technician

## 2024-04-18 DIAGNOSIS — J441 Chronic obstructive pulmonary disease with (acute) exacerbation: Secondary | ICD-10-CM | POA: Diagnosis not present

## 2024-04-18 DIAGNOSIS — I5033 Acute on chronic diastolic (congestive) heart failure: Secondary | ICD-10-CM | POA: Diagnosis not present

## 2024-04-18 DIAGNOSIS — I160 Hypertensive urgency: Secondary | ICD-10-CM | POA: Diagnosis not present

## 2024-04-18 DIAGNOSIS — J9601 Acute respiratory failure with hypoxia: Secondary | ICD-10-CM | POA: Diagnosis not present

## 2024-04-18 LAB — BASIC METABOLIC PANEL WITH GFR
Anion gap: 8 (ref 5–15)
BUN: 27 mg/dL — ABNORMAL HIGH (ref 8–23)
CO2: 31 mmol/L (ref 22–32)
Calcium: 8.4 mg/dL — ABNORMAL LOW (ref 8.9–10.3)
Chloride: 104 mmol/L (ref 98–111)
Creatinine, Ser: 0.75 mg/dL (ref 0.44–1.00)
GFR, Estimated: >60 mL/min (ref >60)
Glucose, Bld: 114 mg/dL — ABNORMAL HIGH (ref 70–99)
Potassium: 4.1 mmol/L (ref 3.5–5.1)
Sodium: 143 mmol/L (ref 135–145)

## 2024-04-18 MED ORDER — PREDNISONE 10 MG PO TABS
ORAL_TABLET | ORAL | 0 refills | Status: DC
Start: 2024-04-19 — End: 2024-07-27

## 2024-04-18 MED ORDER — CEFUROXIME AXETIL 500 MG PO TABS: 500.0000 mg | ORAL_TABLET | Freq: Two times a day (BID) | ORAL | 0 refills | Status: AC

## 2024-04-18 MED ORDER — DAPAGLIFLOZIN PROPANEDIOL 10 MG PO TABS
10.0000 mg | ORAL_TABLET | Freq: Every day | ORAL | 0 refills | Status: DC
Start: 2024-04-19 — End: 2024-07-27

## 2024-04-18 MED ORDER — SPIRONOLACTONE 25 MG PO TABS: 25.0000 mg | ORAL_TABLET | Freq: Every day | ORAL | 0 refills | Status: AC

## 2024-04-18 MED ORDER — FUROSEMIDE 20 MG PO TABS: 20.0000 mg | ORAL_TABLET | Freq: Every day | ORAL | 0 refills | Status: AC

## 2024-04-18 NOTE — Progress Notes (Signed)
 Heart Failure Navigator Progress Note  Assessed for Heart & Vascular TOC clinic readiness.  Patient does not meet criteria due to current [facility] patient.   Navigator will sign off at this time.  Roxy Horseman, RN, BSN [facility] Heart Failure Navigator Secure Chat Only

## 2024-04-18 NOTE — Discharge Summary (Signed)
 Physician Discharge Summary   Patient: Cindy Gutierrez MRN: [id number] DOB: 20-Jun-1961  Admit date:     04/14/2024  Discharge date: 04/18/24  Discharge Physician: Cindy Gutierrez   PCP: [facility]   Recommendations at discharge:   [facility]-up cardiology  Discharge Diagnoses: Principal Problem:   Acute respiratory failure with hypoxia and hypercarbia (HCC) Active Problems:   COPD with acute exacerbation (HCC)   Acute on chronic diastolic CHF (congestive heart failure) (HCC)   Hypertensive urgency   Dyslipidemia   Peripheral neuropathy   Overweight (BMI 25.0-29.9)   Hospital Course: 63 y.o. female with medical history significant for COPD, CHF, coronary artery disease, depression, hepatitis C, hyperparathyroidism, hypertension, mitral valve regurgitation, and pulmonary hypertension, who presented to the emergency room with acute onset of acute respiratory distress with worsening dyspnea that started this evening.  She has not been able to tolerate CPAP that was placed by EMS.  She has associated cough or wheezing.  No fever or chills.  No nausea or vomiting or abdominal pain.  No dysuria, oliguria or hematuria or flank pain   7/16.  Patient tearful that she has mold in her apartment and does not have the resources to move.  Feels that her breathing is better.  Cleared by cardiology to be discharged.  TOC to evaluate. 7/17.  Patient's breathing better this morning.  Assessment and Plan: * Acute respiratory failure with hypoxia and hypercarbia (HCC) - This is likely multifactorial due to acute on chronic diastolic CHF and COPD exacerbation. Patient chronically wears 2 L of oxygen at home and initially came in with respiratory distress requiring BiPAP.   Acute on chronic diastolic CHF (congestive heart failure) (HCC) Continue Farxiga , Aldactone .  Limited with other medications secondary to bronchospasm (no beta-blocker) and allergies (to  lisinopril, Coreg and losartan ).  Patient can go back on Lasix  since creatinine improved to 0.75.  COPD with acute exacerbation (HCC) Continue Solu-Medrol  and nebulizer treatments.  Switch over to prednisone  for 2 more days upon going home.  Switched Rocephin  over to Ceftin .  Hypertensive urgency Blood pressure much improved  Dyslipidemia On Crestor   Overweight (BMI 25.0-29.9) BMI 27.15  Peripheral neuropathy On Lyrica .         Consultants: Cardiology Procedures performed: None Disposition: Home Diet recommendation:  Cardiac diet DISCHARGE MEDICATION: Allergies as of 04/18/2024       Reactions   Sulfa Antibiotics Hives, Rash   Bee Pollen Hives   Hydrochlorothiazide     Chest tightness   Lisinopril Nausea Only   Losartan     Penicillins Hives   Penicillins    Sulfa Antibiotics    Tiotropium Bromide Monohydrate    Other reaction(s): Cough   Carvedilol         Medication List     TAKE these medications    acetaminophen  325 MG tablet Commonly known as: TYLENOL  Take 650 mg by mouth 3 (three) times daily as needed for mild pain (pain score 1-3).   albuterol  (2.5 MG/3ML) 0.083% nebulizer solution Commonly known as: PROVENTIL  Take 2.5 mg by nebulization every 4 (four) hours as needed for wheezing or shortness of breath.   albuterol  108 (90 Base) MCG/ACT inhaler Commonly known as: VENTOLIN  HFA Inhale 2 puffs into the lungs every 6 (six) hours as needed for wheezing or shortness of breath.   amLODipine  10 MG tablet Commonly known as: NORVASC  Take 1 tablet (10 mg total) by mouth daily.   aspirin  81 MG chewable tablet Chew 81  mg by mouth daily.   budesonide -formoterol  160-4.5 MCG/ACT inhaler Commonly known as: SYMBICORT  Inhale 2 puffs into the lungs 2 (two) times daily.   calcium  carbonate 500 MG chewable tablet Commonly known as: TUMS - dosed in mg elemental calcium  Chew 1 tablet by mouth daily. As needed   cefUROXime  500 MG tablet Commonly known as:  CEFTIN  Take 1 tablet (500 mg total) by mouth 2 (two) times daily with a meal for 2 days. Start taking on: April 19, 2024   celecoxib  200 MG capsule Commonly known as: CELEBREX  Take 200 mg by mouth 2 (two) times daily.   dapagliflozin  propanediol 10 MG Tabs tablet Commonly known as: FARXIGA  Take 1 tablet (10 mg total) by mouth daily. Start taking on: April 19, 2024   furosemide  20 MG tablet Commonly known as: LASIX  Take 1 tablet (20 mg total) by mouth daily.   predniSONE  10 MG tablet Commonly known as: DELTASONE  4 tabs po daily for two days Start taking on: April 19, 2024   pregabalin  150 MG capsule Commonly known as: LYRICA  Take 150 mg by mouth 2 (two) times daily.   rosuvastatin  20 MG tablet Commonly known as: CRESTOR  Take 20 mg by mouth daily.   spironolactone  25 MG tablet Commonly known as: ALDACTONE  Take 1 tablet (25 mg total) by mouth daily. Start taking on: April 19, 2024        Follow-up Information     Clarkston, Caralyn, PA-C. Go in 1 week(s).   Specialty: Cardiology Why: 07/22 at 10:30 AM Contact information: [address] [place] [address] [phone]         Florencio Cara BIRCH, MD. Go today.   Specialties: Cardiology, Internal Medicine Why: 08/20 at [facility] information: [address] [place] [address] [phone]         [facility] Follow up in 5 day(s).   Contact information: [address] [place] [address] [phone]                Discharge Exam: Cindy Gutierrez   04/14/24 2002  Weight: 81 kg   Physical Exam HENT:     Head: Normocephalic.  Eyes:     General: Lids are normal.     Conjunctiva/sclera: Conjunctivae normal.  Cardiovascular:     Rate and Rhythm: Normal rate and regular rhythm.     Heart sounds: Normal heart sounds, S1 normal and S2 normal.  Pulmonary:     Breath sounds: Examination of the right-lower field reveals decreased breath sounds. Examination of the  left-lower field reveals decreased breath sounds. Decreased breath sounds present. No wheezing, rhonchi or rales.  Abdominal:     Palpations: Abdomen is soft.     Tenderness: There is no abdominal tenderness.  Musculoskeletal:     Right lower leg: No swelling.     Left lower leg: No swelling.  Skin:    General: Skin is warm.     Findings: No rash.  Neurological:     Mental Status: She is alert and oriented to person, place, and time.      Condition at discharge: stable  The results of significant diagnostics from this hospitalization (including imaging, microbiology, ancillary and laboratory) are listed below for reference.   Imaging Studies: DG Chest Portable 1 View Result Date: 04/14/2024 CLINICAL DATA:  Shortness of breath EXAM: PORTABLE CHEST 1 VIEW COMPARISON:  12/05/2023 FINDINGS: The heart size and mediastinal contours are within normal limits. Diffuse interstitial opacity superimposed upon emphysema. The visualized skeletal  structures are unremarkable. IMPRESSION: Diffuse interstitial opacity superimposed upon emphysema, consistent with edema or atypical/viral infection. No focal airspace opacity. Electronically Signed   By: Marolyn JONETTA Jaksch M.D.   On: 04/14/2024 20:25    Microbiology: Results for orders placed or performed during the hospital encounter of 04/14/24  Resp panel by RT-PCR (RSV, Flu A&B, Covid) Anterior Nasal Swab     Status: None   Collection Time: 04/14/24  8:04 PM   Specimen: Anterior Nasal Swab  Result Value Ref Range Status   SARS Coronavirus 2 by RT PCR NEGATIVE NEGATIVE Final    Comment: (NOTE) SARS-CoV-2 target nucleic acids are NOT DETECTED.  The SARS-CoV-2 RNA is generally detectable in upper respiratory specimens during the acute phase of infection. The lowest concentration of SARS-CoV-2 viral copies this assay can detect is 138 copies/mL. A negative result does not preclude SARS-Cov-2 infection and should not be used as the sole basis for treatment  or other patient management decisions. A negative result may occur with  improper specimen collection/handling, submission of specimen other than nasopharyngeal swab, presence of viral mutation(s) within the areas targeted by this assay, and inadequate number of viral copies(<138 copies/mL). A negative result must be combined with clinical observations, patient history, and epidemiological information. The expected result is Negative.  Fact Sheet for Patients:  BloggerCourse.com  Fact Sheet for Healthcare Providers:  SeriousBroker.it  This test is no t yet approved or cleared by the [place]  FDA and  has been authorized for detection and/or diagnosis of SARS-CoV-2 by FDA under an Emergency Use Authorization (EUA). This EUA will remain  in effect (meaning this test can be used) for the duration of the COVID-19 declaration under Section 564(b)(1) of the Act, 21 U.S.C.section 360bbb-3(b)(1), unless the authorization is terminated  or revoked sooner.       Influenza A by PCR NEGATIVE NEGATIVE Final   Influenza B by PCR NEGATIVE NEGATIVE Final    Comment: (NOTE) The Xpert Xpress SARS-CoV-2/FLU/RSV plus assay is intended as an aid in the diagnosis of influenza from Nasopharyngeal swab specimens and should not be used as a sole basis for treatment. Nasal washings and aspirates are unacceptable for Xpert Xpress SARS-CoV-2/FLU/RSV testing.  Fact Sheet for Patients: BloggerCourse.com  Fact Sheet for Healthcare Providers: SeriousBroker.it  This test is not yet approved or cleared by the [place]  FDA and has been authorized for detection and/or diagnosis of SARS-CoV-2 by FDA under an Emergency Use Authorization (EUA). This EUA will remain in effect (meaning this test can be used) for the duration of the COVID-19 declaration under Section 564(b)(1) of the Act, 21 U.S.C. section  360bbb-3(b)(1), unless the authorization is terminated or revoked.     Resp Syncytial Virus by PCR NEGATIVE NEGATIVE Final    Comment: (NOTE) Fact Sheet for Patients: BloggerCourse.com  Fact Sheet for Healthcare Providers: SeriousBroker.it  This test is not yet approved or cleared by the [place]  FDA and has been authorized for detection and/or diagnosis of SARS-CoV-2 by FDA under an Emergency Use Authorization (EUA). This EUA will remain in effect (meaning this test can be used) for the duration of the COVID-19 declaration under Section 564(b)(1) of the Act, 21 U.S.C. section 360bbb-3(b)(1), unless the authorization is terminated or revoked.  Performed at [facility], [address] [place], [place] [address]     Labs: CBC: Recent Labs  Lab 04/14/24 2005 04/15/24 0622 04/16/24 0439 04/17/24 0421  WBC 12.2* 6.7 13.2* 13.5*  NEUTROABS 6.4  --   --   --  HGB 13.1 12.3 12.0 11.7*  HCT 42.7 38.2 38.4 37.2  MCV 89.0 87.0 86.5 86.7  PLT 305 223 234 234   Basic Metabolic Panel: Recent Labs  Lab 04/14/24 2005 04/15/24 0622 04/16/24 0439 04/17/24 0421 04/18/24 0534  NA 144 143 141 143 143  K 4.4 4.2 4.1 4.2 4.1  CL 108 104 99 101 104  CO2 30 29 32 32 31  GLUCOSE 118* 231* 169* 150* 114*  BUN 17 16 25* 32* 27*  CREATININE 0.63 0.69 0.82 1.01* 0.75  CALCIUM  9.0 8.6* 9.1 8.7* 8.4*   Liver Function Tests: Recent Labs  Lab 04/14/24 2005  AST 20  ALT 12  ALKPHOS 99  BILITOT 0.5  PROT 7.1  ALBUMIN 4.2   CBG: No results for input(s): GLUCAP in the last 168 hours.  Discharge time spent: greater than 30 minutes.  Signed: Charlie Patterson, MD [facility] 04/18/2024

## 2024-04-18 NOTE — Telephone Encounter (Signed)
 Pharmacy Patient Advocate Encounter  Insurance verification completed.    The patient is insured through [employer] MEDICAID.     Ran test claim for Entresto 24-26mg  capsules and the current 30 day co-pay is $4.00.   This test claim was processed through [facility]- copay amounts may vary at other pharmacies due to pharmacy/plan contracts, or as the patient moves through the different stages of their insurance plan.

## 2024-04-18 NOTE — TOC Initial Note (Signed)
 Transition of Care [facility]) - Initial/Assessment Note    Patient Details  Name: Cindy Gutierrez MRN: [id number] Date of Birth: 04/18/1961  Transition of Care [facility]) CM/SW Contact:    Elouise LULLA Capri, RN 04/18/2024, 10:08 AM  Clinical Narrative:                  CM to patient's room regarding case management assessment. Patient verbalized agreement with case management interview. Per patient lives with 2 sons. Per patient, sons provide transportation for needs. Per patient uses oxygen through [employer]. CM and patient discussed patient's son bringing portable oxygen at discharge.  Expected Discharge Plan: Home/Self Care     Patient Goals and CMS Choice    Home/self care   Expected Discharge Plan and Services      Home/self care   Expected Discharge Date: 04/18/24                    Prior Living Arrangements/Services    Independent ADLs          Need for Family Participation in Patient Care: Yes (Comment) Care giver support system in place?: Yes (comment)   Criminal Activity/Legal Involvement Pertinent to Current Situation/Hospitalization: No - Comment as needed  Activities of Daily Living   ADL Screening (condition at time of admission) Independently performs ADLs?: Yes (appropriate for developmental age) Is the patient deaf or have difficulty hearing?: No Does the patient have difficulty seeing, even when wearing glasses/contacts?: No Does the patient have difficulty concentrating, remembering, or making decisions?: No  Permission Sought/Granted Permission sought to share information with : Case Manager, Family Supports    Share Information with NAME: Margene Cherian and Sidra Budge     Permission granted to share info w Relationship: Son/Son     Emotional Assessment Appearance:: Appears stated age Attitude/Demeanor/Rapport: Engaged Affect (typically observed): Calm Orientation: : Oriented to Self, Oriented to Place, Oriented to  Time, Oriented to Situation       Admission diagnosis:  COPD exacerbation (HCC) [J44.1] Acute respiratory failure with hypoxia and hypercarbia (HCC) [J96.01, J96.02] Acute respiratory failure with hypoxia and hypercapnia (HCC) [J96.01, J96.02] Patient Active Problem List   Diagnosis Date Noted   Overweight (BMI 25.0-29.9) 04/17/2024   Hypertensive urgency 04/15/2024   Acute on chronic diastolic CHF (congestive heart failure) (HCC) 04/15/2024   Acute respiratory failure with hypoxia and hypercarbia (HCC) 04/14/2024   COVID-19 virus infection 12/05/2023   Cough 12/05/2023   Elevated troponin 12/05/2023   Acute hypoxic respiratory failure (HCC) 11/18/2023   Acute respiratory failure (HCC) 10/16/2023   NSTEMI (non-ST elevated myocardial infarction) (HCC) 10/16/2023   SIRS (systemic inflammatory response syndrome) (HCC) 10/16/2023   Hypertensive emergency 10/16/2023   Dyslipidemia 09/18/2023   Peripheral neuropathy 09/18/2023   Hypokalemia 09/18/2023   Severe sepsis (HCC) 09/24/2022   Influenza A with pneumonia 09/23/2022   Nicotine  dependence 09/23/2022   Onychomycosis 09/23/2022   Hyperglycemia 09/23/2022   Lactic acidosis 09/23/2022   Acute on chronic respiratory failure with hypoxia (HCC) 05/23/2021   Aortic valve regurgitation 05/19/2021   Depression 05/19/2021   Primary hyperparathyroidism (HCC) 05/19/2021   Lymphedema, not elsewhere classified 05/19/2021   Major depressive disorder, single episode, 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/19/2021   Other chest pain 05/19/2021   Pulmonary nodule 05/19/2021   Viral hepatitis C 04/02/2021   Colon cancer screening    Heart murmur 10/20/2017   COPD with acute exacerbation (HCC) 06/13/2017   Essential hypertension 06/13/2017   Tobacco use 06/13/2017   Herpes  simplex 05/31/2017   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, [place] - [address] AT [facility] [address] [place] [address] Phone: [phone] Fax: [phone]  [facility] - [facility] [address] [place] [address] Phone: [phone] Fax: [phone]     Social Drivers of Health (SDOH) Social History: SDOH Screenings   Food Insecurity: No Food Insecurity (04/15/2024)  Housing: Unknown (12/06/2023)  Transportation Needs: No Transportation Needs (04/15/2024)  Utilities: At Risk (04/15/2024)  Depression (PHQ2-9): High Risk (09/08/2022)  Tobacco Use: Medium Risk (04/14/2024)   SDOH Interventions:     Readmission Risk Interventions     No data to display

## 2024-04-18 NOTE — TOC Transition Note (Signed)
 Transition of Care [facility]) - Discharge Note   Patient Details  Name: Cindy Gutierrez MRN: [id number] Date of Birth: Dec 20, 1960  Transition [facility] Care [facility]) CM/SW Contact:  Elouise LULLA Capri, RN 04/18/2024, 10:33 AM   Clinical Narrative:     Discharge orders noted. Private transportation arranged. CM call to patient's room regarding patient's son bringing portable oxygen for discharge. Per patient, patient's son was contacted and he will bring portable oxygen at discharge.   Patient Goals and CMS Choice    Home/self care  Discharge Placement       Home/self care          Discharge Plan and Services Additional resources added to the After Visit Summary for      Social Drivers of Health (SDOH) Interventions SDOH Screenings   Food Insecurity: No Food Insecurity (04/15/2024)  Housing: Unknown (12/06/2023)  Transportation Needs: No Transportation Needs (04/15/2024)  Utilities: At Risk (04/15/2024)  Depression (PHQ2-9): High Risk (09/08/2022)  Tobacco Use: Medium Risk (04/14/2024)     Readmission Risk Interventions    04/18/2024   10:30 AM  Readmission Risk Prevention Plan  Transportation Screening Complete  PCP or Specialist Appt within 3-5 Days Complete  Medication Review (RN Care Manager) Complete

## 2024-07-10 ENCOUNTER — Other Ambulatory Visit: Payer: Self-pay | Admitting: Internal Medicine

## 2024-07-10 DIAGNOSIS — Z1231 Encounter for screening mammogram for malignant neoplasm of breast: Secondary | ICD-10-CM

## 2024-07-22 ENCOUNTER — Emergency Department

## 2024-07-22 ENCOUNTER — Encounter: Payer: Self-pay | Admitting: Emergency Medicine

## 2024-07-22 ENCOUNTER — Inpatient Hospital Stay
Admission: EM | Admit: 2024-07-22 | Discharge: 2024-07-27 | DRG: 286 | Disposition: A | Discharge status: Home / Self Care | Attending: Obstetrics and Gynecology | Admitting: Obstetrics and Gynecology

## 2024-07-22 ENCOUNTER — Inpatient Hospital Stay
Admit: 2024-07-22 | Discharge: 2024-07-22 | Disposition: A | Discharge status: Home / Self Care | Attending: Internal Medicine | Admitting: Internal Medicine

## 2024-07-22 ENCOUNTER — Other Ambulatory Visit: Payer: Self-pay

## 2024-07-22 DIAGNOSIS — F32A Depression, unspecified: Secondary | ICD-10-CM | POA: Diagnosis present

## 2024-07-22 DIAGNOSIS — J441 Chronic obstructive pulmonary disease with (acute) exacerbation: Secondary | ICD-10-CM | POA: Diagnosis present

## 2024-07-22 DIAGNOSIS — J9601 Acute respiratory failure with hypoxia: Secondary | ICD-10-CM | POA: Diagnosis present

## 2024-07-22 DIAGNOSIS — I5181 Takotsubo syndrome: Secondary | ICD-10-CM | POA: Insufficient documentation

## 2024-07-22 DIAGNOSIS — E213 Hyperparathyroidism, unspecified: Secondary | ICD-10-CM | POA: Diagnosis present

## 2024-07-22 DIAGNOSIS — Z1152 Encounter for screening for COVID-19: Secondary | ICD-10-CM | POA: Diagnosis not present

## 2024-07-22 DIAGNOSIS — I2583 Coronary atherosclerosis due to lipid rich plaque: Secondary | ICD-10-CM | POA: Diagnosis not present

## 2024-07-22 DIAGNOSIS — Z87891 Personal history of nicotine dependence: Secondary | ICD-10-CM

## 2024-07-22 DIAGNOSIS — Z888 Allergy status to other drugs, medicaments and biological substances status: Secondary | ICD-10-CM

## 2024-07-22 DIAGNOSIS — I5020 Unspecified systolic (congestive) heart failure: Secondary | ICD-10-CM | POA: Insufficient documentation

## 2024-07-22 DIAGNOSIS — F419 Anxiety disorder, unspecified: Secondary | ICD-10-CM | POA: Diagnosis present

## 2024-07-22 DIAGNOSIS — Z8249 Family history of ischemic heart disease and other diseases of the circulatory system: Secondary | ICD-10-CM | POA: Diagnosis not present

## 2024-07-22 DIAGNOSIS — I5021 Acute systolic (congestive) heart failure: Secondary | ICD-10-CM | POA: Diagnosis present

## 2024-07-22 DIAGNOSIS — I5023 Acute on chronic systolic (congestive) heart failure: Secondary | ICD-10-CM | POA: Diagnosis not present

## 2024-07-22 DIAGNOSIS — I110 Hypertensive heart disease with heart failure: Secondary | ICD-10-CM | POA: Diagnosis present

## 2024-07-22 DIAGNOSIS — Z882 Allergy status to sulfonamides status: Secondary | ICD-10-CM | POA: Diagnosis not present

## 2024-07-22 DIAGNOSIS — I2510 Atherosclerotic heart disease of native coronary artery without angina pectoris: Secondary | ICD-10-CM | POA: Diagnosis present

## 2024-07-22 DIAGNOSIS — Z7982 Long term (current) use of aspirin: Secondary | ICD-10-CM

## 2024-07-22 DIAGNOSIS — Z880 Allergy status to penicillin: Secondary | ICD-10-CM | POA: Diagnosis not present

## 2024-07-22 DIAGNOSIS — Z7951 Long term (current) use of inhaled steroids: Secondary | ICD-10-CM | POA: Diagnosis not present

## 2024-07-22 DIAGNOSIS — I340 Nonrheumatic mitral (valve) insufficiency: Secondary | ICD-10-CM | POA: Diagnosis present

## 2024-07-22 DIAGNOSIS — Z59868 Other specified financial insecurity: Secondary | ICD-10-CM

## 2024-07-22 DIAGNOSIS — R0902 Hypoxemia: Secondary | ICD-10-CM

## 2024-07-22 DIAGNOSIS — G8929 Other chronic pain: Secondary | ICD-10-CM | POA: Diagnosis present

## 2024-07-22 DIAGNOSIS — Z79899 Other long term (current) drug therapy: Secondary | ICD-10-CM | POA: Diagnosis not present

## 2024-07-22 DIAGNOSIS — B1920 Unspecified viral hepatitis C without hepatic coma: Secondary | ICD-10-CM | POA: Diagnosis present

## 2024-07-22 DIAGNOSIS — Z820 Family history of epilepsy and other diseases of the nervous system: Secondary | ICD-10-CM

## 2024-07-22 DIAGNOSIS — J449 Chronic obstructive pulmonary disease, unspecified: Secondary | ICD-10-CM | POA: Diagnosis present

## 2024-07-22 DIAGNOSIS — J9621 Acute and chronic respiratory failure with hypoxia: Secondary | ICD-10-CM | POA: Diagnosis present

## 2024-07-22 DIAGNOSIS — I160 Hypertensive urgency: Secondary | ICD-10-CM | POA: Diagnosis present

## 2024-07-22 DIAGNOSIS — Z91030 Bee allergy status: Secondary | ICD-10-CM | POA: Diagnosis not present

## 2024-07-22 DIAGNOSIS — I272 Pulmonary hypertension, unspecified: Secondary | ICD-10-CM | POA: Diagnosis present

## 2024-07-22 DIAGNOSIS — G629 Polyneuropathy, unspecified: Secondary | ICD-10-CM | POA: Diagnosis present

## 2024-07-22 DIAGNOSIS — I429 Cardiomyopathy, unspecified: Secondary | ICD-10-CM | POA: Diagnosis not present

## 2024-07-22 DIAGNOSIS — R0602 Shortness of breath: Secondary | ICD-10-CM | POA: Diagnosis present

## 2024-07-22 LAB — ECHOCARDIOGRAM COMPLETE
AR max vel: 3.09 cm2
AV Area VTI: 3.09 cm2
AV Area mean vel: 2.97 cm2
AV Mean grad: 4.3 mmHg
AV Peak grad: 8.1 mmHg
Ao pk vel: 1.42 m/s
Area-P 1/2: 3.89 cm2
Est EF: 40
Height: 70 in
MV VTI: 3.22 cm2
P 1/2 time: 445 ms
S' Lateral: 3.1 cm
Weight: 2720 [oz_av]

## 2024-07-22 LAB — CBC WITH DIFFERENTIAL/PLATELET
Abs Immature Granulocytes: 0.03 K/uL (ref 0.00–0.07)
Basophils Absolute: 0.1 K/uL (ref 0.0–0.1)
Basophils Relative: 1 %
Eosinophils Absolute: 0.4 K/uL (ref 0.0–0.5)
Eosinophils Relative: 4 %
HCT: 38.5 % (ref 36.0–46.0)
Hemoglobin: 12 g/dL (ref 12.0–15.0)
Immature Granulocytes: 0 %
Lymphocytes Relative: 23 %
Lymphs Abs: 2.2 K/uL (ref 0.7–4.0)
MCH: 28 pg (ref 26.0–34.0)
MCHC: 31.2 g/dL (ref 30.0–36.0)
MCV: 90 fL (ref 80.0–100.0)
Monocytes Absolute: 0.6 K/uL (ref 0.1–1.0)
Monocytes Relative: 6 %
Neutro Abs: 6.2 K/uL (ref 1.7–7.7)
Neutrophils Relative %: 66 %
Platelets: 235 K/uL (ref 150–400)
RBC: 4.28 MIL/uL (ref 3.87–5.11)
RDW: 14.2 % (ref 11.5–15.5)
WBC: 9.4 K/uL (ref 4.0–10.5)
nRBC: 0 % (ref 0.0–0.2)

## 2024-07-22 LAB — COMPREHENSIVE METABOLIC PANEL WITH GFR
ALT: 10 U/L (ref 0–44)
AST: 16 U/L (ref 15–41)
Albumin: 3.7 g/dL (ref 3.5–5.0)
Alkaline Phosphatase: 80 U/L (ref 38–126)
Anion gap: 9 (ref 5–15)
BUN: 15 mg/dL (ref 8–23)
CO2: 26 mmol/L (ref 22–32)
Calcium: 8.4 mg/dL — ABNORMAL LOW (ref 8.9–10.3)
Chloride: 111 mmol/L (ref 98–111)
Creatinine, Ser: 0.73 mg/dL (ref 0.44–1.00)
GFR, Estimated: >60 mL/min (ref >60)
Glucose, Bld: 133 mg/dL — ABNORMAL HIGH (ref 70–99)
Potassium: 3.5 mmol/L (ref 3.5–5.1)
Sodium: 146 mmol/L — ABNORMAL HIGH (ref 135–145)
Total Bilirubin: 0.4 mg/dL (ref 0.0–1.2)
Total Protein: 6.5 g/dL (ref 6.5–8.1)

## 2024-07-22 LAB — TROPONIN I (HIGH SENSITIVITY)
Troponin I (High Sensitivity): 12 ng/L (ref <18)
Troponin I (High Sensitivity): 57 ng/L — ABNORMAL HIGH (ref <18)
Troponin I (High Sensitivity): 92 ng/L — ABNORMAL HIGH (ref <18)

## 2024-07-22 LAB — BLOOD GAS, VENOUS
Acid-Base Excess: 0.6 mmol/L (ref 0.0–2.0)
Bicarbonate: 27.4 mmol/L (ref 20.0–28.0)
O2 Saturation: 92.9 %
Patient temperature: 37
pCO2, Ven: 52 mmHg (ref 44–60)
pH, Ven: 7.33 (ref 7.25–7.43)
pO2, Ven: 65 mmHg — ABNORMAL HIGH (ref 32–45)

## 2024-07-22 LAB — RESP PANEL BY RT-PCR (RSV, FLU A&B, COVID)  RVPGX2
Influenza A by PCR: NEGATIVE
Influenza B by PCR: NEGATIVE
Resp Syncytial Virus by PCR: NEGATIVE
SARS Coronavirus 2 by RT PCR: NEGATIVE

## 2024-07-22 LAB — APTT: aPTT: 137 s — ABNORMAL HIGH (ref 24–36)

## 2024-07-22 LAB — BRAIN NATRIURETIC PEPTIDE: B Natriuretic Peptide: 545.2 pg/mL — ABNORMAL HIGH (ref 0.0–100.0)

## 2024-07-22 LAB — PROTIME-INR
INR: 1 (ref 0.8–1.2)
Prothrombin Time: 14 s (ref 11.4–15.2)

## 2024-07-22 LAB — LACTIC ACID, PLASMA: Lactic Acid, Venous: 1.3 mmol/L (ref 0.5–1.9)

## 2024-07-22 MED ORDER — HEPARIN BOLUS VIA INFUSION
4000.0000 [IU] | Freq: Once | INTRAVENOUS | Status: AC
  Administered 2024-07-22: 4000 [IU] via INTRAVENOUS
  Filled 2024-07-22: qty 4000

## 2024-07-22 MED ORDER — OLANZAPINE 10 MG IM SOLR
2.5000 mg | Freq: Four times a day (QID) | INTRAMUSCULAR | Status: DC | PRN
  Filled 2024-07-22: qty 10

## 2024-07-22 MED ORDER — CELECOXIB 200 MG PO CAPS
200.0000 mg | ORAL_CAPSULE | ORAL | Status: AC
  Administered 2024-07-22: 200 mg via ORAL
  Filled 2024-07-22: qty 1

## 2024-07-22 MED ORDER — ALBUTEROL SULFATE (2.5 MG/3ML) 0.083% IN NEBU
2.5000 mg | INHALATION_SOLUTION | Freq: Four times a day (QID) | RESPIRATORY_TRACT | Status: DC | PRN
  Administered 2024-07-23 – 2024-07-24 (×3): 2.5 mg via RESPIRATORY_TRACT
  Filled 2024-07-22 (×4): qty 3

## 2024-07-22 MED ORDER — PREDNISONE 20 MG PO TABS
40.0000 mg | ORAL_TABLET | Freq: Every day | ORAL | Status: AC
  Administered 2024-07-23 – 2024-07-26 (×4): 40 mg via ORAL
  Filled 2024-07-22 (×4): qty 2

## 2024-07-22 MED ORDER — ALBUTEROL SULFATE (2.5 MG/3ML) 0.083% IN NEBU
2.5000 mg | INHALATION_SOLUTION | Freq: Once | RESPIRATORY_TRACT | Status: AC
  Administered 2024-07-22: 2.5 mg via RESPIRATORY_TRACT
  Filled 2024-07-22: qty 3

## 2024-07-22 MED ORDER — ONDANSETRON HCL 4 MG/2ML IJ SOLN: 4.0000 mg | Freq: Four times a day (QID) | INTRAMUSCULAR | Status: DC | PRN

## 2024-07-22 MED ORDER — ALBUTEROL SULFATE (2.5 MG/3ML) 0.083% IN NEBU: 2.5000 mg | INHALATION_SOLUTION | Freq: Four times a day (QID) | RESPIRATORY_TRACT | Status: DC

## 2024-07-22 MED ORDER — ENOXAPARIN SODIUM 40 MG/0.4ML IJ SOSY: 40.0000 mg | PREFILLED_SYRINGE | INTRAMUSCULAR | Status: DC

## 2024-07-22 MED ORDER — ESCITALOPRAM OXALATE 10 MG PO TABS
10.0000 mg | ORAL_TABLET | Freq: Every day | ORAL | Status: DC
  Administered 2024-07-22 – 2024-07-27 (×6): 10 mg via ORAL
  Filled 2024-07-22 (×6): qty 1

## 2024-07-22 MED ORDER — FUROSEMIDE 10 MG/ML IJ SOLN: 40.0000 mg | Freq: Two times a day (BID) | INTRAMUSCULAR | Status: DC

## 2024-07-22 MED ORDER — PREGABALIN 75 MG PO CAPS
150.0000 mg | ORAL_CAPSULE | Freq: Two times a day (BID) | ORAL | Status: DC
  Administered 2024-07-22 – 2024-07-27 (×10): 150 mg via ORAL
  Filled 2024-07-22 (×10): qty 2

## 2024-07-22 MED ORDER — IBUPROFEN 400 MG PO TABS
400.0000 mg | ORAL_TABLET | Freq: Four times a day (QID) | ORAL | Status: DC | PRN
  Administered 2024-07-22: 400 mg via ORAL
  Filled 2024-07-22: qty 1

## 2024-07-22 MED ORDER — FUROSEMIDE 10 MG/ML IJ SOLN
40.0000 mg | Freq: Two times a day (BID) | INTRAMUSCULAR | Status: AC
  Administered 2024-07-22 – 2024-07-23 (×2): 40 mg via INTRAVENOUS
  Filled 2024-07-22 (×2): qty 4

## 2024-07-22 MED ORDER — ORAL CARE MOUTH RINSE: 15.0000 mL | OROMUCOSAL | Status: DC | PRN

## 2024-07-22 MED ORDER — ALBUTEROL SULFATE (2.5 MG/3ML) 0.083% IN NEBU
2.5000 mg | INHALATION_SOLUTION | Freq: Three times a day (TID) | RESPIRATORY_TRACT | Status: DC
  Administered 2024-07-22 – 2024-07-27 (×11): 2.5 mg via RESPIRATORY_TRACT
  Filled 2024-07-22 (×14): qty 3

## 2024-07-22 MED ORDER — SPIRONOLACTONE 25 MG PO TABS
25.0000 mg | ORAL_TABLET | Freq: Every day | ORAL | Status: DC
  Administered 2024-07-22 – 2024-07-27 (×6): 25 mg via ORAL
  Filled 2024-07-22 (×7): qty 1

## 2024-07-22 MED ORDER — METHYLPREDNISOLONE SODIUM SUCC 40 MG IJ SOLR
40.0000 mg | Freq: Two times a day (BID) | INTRAMUSCULAR | Status: AC
  Administered 2024-07-22 – 2024-07-23 (×2): 40 mg via INTRAVENOUS
  Filled 2024-07-22 (×2): qty 1

## 2024-07-22 MED ORDER — CELECOXIB 200 MG PO CAPS
200.0000 mg | ORAL_CAPSULE | Freq: Two times a day (BID) | ORAL | Status: DC
Start: 2024-07-22 — End: 2024-07-27
  Administered 2024-07-22 – 2024-07-27 (×10): 200 mg via ORAL
  Filled 2024-07-22 (×10): qty 1

## 2024-07-22 MED ORDER — SENNOSIDES-DOCUSATE SODIUM 8.6-50 MG PO TABS: 1.0000 | ORAL_TABLET | Freq: Every evening | ORAL | Status: DC | PRN

## 2024-07-22 MED ORDER — FUROSEMIDE 10 MG/ML IJ SOLN: 20.0000 mg | Freq: Once | INTRAMUSCULAR | Status: DC

## 2024-07-22 MED ORDER — OXYCODONE HCL 5 MG PO TABS
5.0000 mg | ORAL_TABLET | Freq: Four times a day (QID) | ORAL | Status: DC | PRN
  Filled 2024-07-22: qty 1

## 2024-07-22 MED ORDER — QUETIAPINE FUMARATE 25 MG PO TABS
25.0000 mg | ORAL_TABLET | Freq: Once | ORAL | Status: AC
  Administered 2024-07-22: 25 mg via ORAL
  Filled 2024-07-22: qty 1

## 2024-07-22 MED ORDER — ONDANSETRON HCL 4 MG PO TABS
4.0000 mg | ORAL_TABLET | Freq: Four times a day (QID) | ORAL | Status: DC | PRN
  Administered 2024-07-25: 4 mg via ORAL
  Filled 2024-07-22: qty 1

## 2024-07-22 MED ORDER — FLUTICASONE FUROATE-VILANTEROL 100-25 MCG/ACT IN AEPB
1.0000 | INHALATION_SPRAY | Freq: Every day | RESPIRATORY_TRACT | Status: DC
  Filled 2024-07-22 (×2): qty 28

## 2024-07-22 MED ORDER — ACETAMINOPHEN 500 MG PO TABS
1000.0000 mg | ORAL_TABLET | ORAL | Status: AC
  Administered 2024-07-22: 1000 mg via ORAL
  Filled 2024-07-22: qty 2

## 2024-07-22 MED ORDER — HEPARIN (PORCINE) 25000 UT/250ML-% IV SOLN
1150.0000 [IU]/h | INTRAVENOUS | Status: DC
  Administered 2024-07-22: 900 [IU]/h via INTRAVENOUS
  Administered 2024-07-24: 1250 [IU]/h via INTRAVENOUS
  Administered 2024-07-25: 1150 [IU]/h via INTRAVENOUS
  Filled 2024-07-22 (×4): qty 250

## 2024-07-22 MED ORDER — ALBUTEROL SULFATE (2.5 MG/3ML) 0.083% IN NEBU
2.5000 mg | INHALATION_SOLUTION | RESPIRATORY_TRACT | Status: DC
  Administered 2024-07-22 (×2): 2.5 mg via RESPIRATORY_TRACT
  Filled 2024-07-22: qty 3

## 2024-07-22 MED ORDER — ASPIRIN 81 MG PO CHEW
81.0000 mg | CHEWABLE_TABLET | Freq: Every day | ORAL | Status: DC
  Administered 2024-07-22 – 2024-07-27 (×5): 81 mg via ORAL
  Filled 2024-07-22 (×6): qty 1

## 2024-07-22 MED ORDER — PREGABALIN 75 MG PO CAPS
150.0000 mg | ORAL_CAPSULE | ORAL | Status: AC
  Administered 2024-07-22: 150 mg via ORAL
  Filled 2024-07-22: qty 2

## 2024-07-22 MED ORDER — ACETAMINOPHEN 325 MG PO TABS
650.0000 mg | ORAL_TABLET | Freq: Three times a day (TID) | ORAL | Status: DC | PRN
  Administered 2024-07-22 – 2024-07-23 (×2): 650 mg via ORAL
  Filled 2024-07-22 (×2): qty 2

## 2024-07-22 MED ORDER — SODIUM CHLORIDE 0.9 % IV SOLN
100.0000 mg | Freq: Two times a day (BID) | INTRAVENOUS | Status: DC
Start: 2024-07-22 — End: 2024-07-25
  Administered 2024-07-22 – 2024-07-25 (×7): 100 mg via INTRAVENOUS
  Filled 2024-07-22 (×8): qty 100

## 2024-07-22 MED ORDER — ROSUVASTATIN CALCIUM 10 MG PO TABS
20.0000 mg | ORAL_TABLET | Freq: Every day | ORAL | Status: DC
  Administered 2024-07-22 – 2024-07-27 (×6): 20 mg via ORAL
  Filled 2024-07-22 (×5): qty 2
  Filled 2024-07-22: qty 1

## 2024-07-22 MED ORDER — METHYLPREDNISOLONE SODIUM SUCC 125 MG IJ SOLR
125.0000 mg | Freq: Once | INTRAMUSCULAR | Status: AC
  Administered 2024-07-22: 125 mg via INTRAVENOUS
  Filled 2024-07-22: qty 2

## 2024-07-22 NOTE — Plan of Care (Signed)
  Problem: Clinical Measurements: Goal: Ability to maintain clinical measurements within normal limits will improve Outcome: Progressing   Problem: Clinical Measurements: Goal: Respiratory complications will improve Outcome: Progressing   Problem: Clinical Measurements: Goal: Cardiovascular complication will be avoided Outcome: Progressing   Problem: Activity: Goal: Risk for activity intolerance will decrease Outcome: Progressing   Problem: Safety: Goal: Ability to remain free from injury will improve Outcome: Progressing   Problem: Pain Managment: Goal: General experience of comfort will improve and/or be controlled Outcome: Progressing

## 2024-07-22 NOTE — Plan of Care (Signed)
Report called to 2A 

## 2024-07-22 NOTE — Progress Notes (Signed)
 PHARMACY - ANTICOAGULATION CONSULT NOTE  Pharmacy Consult for heparin  Indication: chest pain/ACS  Allergies  Allergen Reactions   Sulfa Antibiotics Hives and Rash   Bee Pollen Hives   Hydrochlorothiazide      Chest tightness   Lisinopril Nausea Only   Losartan     Penicillins Hives   Penicillins    Sulfa Antibiotics    Tiotropium Bromide     Other reaction(s): Cough   Carvedilol     Patient Measurements: Height: 5' 10 (177.8 cm) Weight: 77.1 kg (170 lb) IBW/kg (Calculated) : 68.5 HEPARIN  DW (KG): 77.1  Vital Signs: Temp: 98.5 F (36.9 C) (10/20 1606) Temp Source: Oral (10/20 0526) BP: 170/62 (10/20 1606) Pulse Rate: 76 (10/20 1606)  Labs: Recent Labs    07/22/24 0538 07/22/24 0731 07/22/24 1021  HGB 12.0  --   --   HCT 38.5  --   --   PLT 235  --   --   CREATININE 0.73  --   --   TROPONINIHS 12 57* 92*    Estimated Creatinine Clearance: 76.8 mL/min (by C-G formula based on SCr of 0.73 mg/dL).   Medical History: Past Medical History:  Diagnosis Date   Aortic valve regurgitation    CHF (congestive heart failure) (HCC)    COPD (chronic obstructive pulmonary disease) (HCC)    Coronary artery disease    Depression    Hepatitis C    Hyperparathyroidism    Hypertension    Mitral valve regurgitation    Neuropathy    to R arm only   Non-toxic multinodular goiter    Pulmonary hypertension (HCC) 2022   noted on CT   Pulmonary nodule    Thyroid  disease     Medications:  Scheduled:   albuterol   2.5 mg Nebulization TID   aspirin   81 mg Oral Daily   escitalopram   10 mg Oral Daily   fluticasone  furoate-vilanterol  1 puff Inhalation Daily   furosemide   40 mg Intravenous Q12H   methylPREDNISolone  (SOLU-MEDROL ) injection  40 mg Intravenous Q12H   Followed by   NOREEN ON 07/23/2024] predniSONE   40 mg Oral Q breakfast   pregabalin   150 mg Oral BID   rosuvastatin   20 mg Oral Daily   spironolactone   25 mg Oral Daily    Assessment:  63 y/o female  presenting with shortness of breath. PMH significant for COPD, HFpEF, CAD, anxiety/depression, hepatitis C, hyperparathyroidism, HTN, peripheral neuropathy. In ED, found to have elevated troponin levels. Pharmacy has been consulted to initiate heparin  infusion. Per chart review, patient is not on anticoagulation prior to admission.  Baseline labs: hgb 12.0, plt 235, aPTT & INR ordered  Goal of Therapy:  Heparin  level 0.3-0.7 units/ml Monitor platelets by anticoagulation protocol: Yes   Plan:  Give 4000 units bolus x 1 Start heparin  infusion at 900 units/hr Check anti-Xa level in 6 hours and daily while on heparin  Continue to monitor H&H and platelets  Thank you for involving pharmacy in this patient's care.   Damien Napoleon, PharmD Clinical Pharmacist 07/22/2024 5:01 PM

## 2024-07-22 NOTE — Progress Notes (Signed)
 Patient transferred to  room 257 via wheelchair on Cindy Gutierrez

## 2024-07-22 NOTE — Plan of Care (Signed)

## 2024-07-22 NOTE — ED Notes (Signed)
 Admission MD at bedside.

## 2024-07-22 NOTE — Progress Notes (Addendum)
 Echocardiogram showed new onset of low LVEF 40% compared to normal LVEF in the echo done in January of this year, on top of that there is also signs of regional wall motion abnormalities suspicious for ACS.  Troponin continue to trend up 52> 92.  Discussed with cardiology, who plans to see the patient tomorrow.  Will start heparin  drip. Will start Lasix  BID x2. Transfer to PCU for PRN BIPAP.

## 2024-07-22 NOTE — Progress Notes (Signed)
*  PRELIMINARY RESULTS* Echocardiogram 2D Echocardiogram has been performed.  Cindy Gutierrez 07/22/2024, 9:58 AM

## 2024-07-22 NOTE — H&P (Signed)
 History and Physical    Cindy Gutierrez FMW:[id number] DOB: [birth year] DOA: 07/22/2024  PCP: [facility], [facility] (Confirm with patient/family/NH records and if not entered, this has to be entered at [facility] point of entry) Patient coming from: Home  I have personally briefly reviewed patient's old medical records in [facility]  Chief Complaint: Cough wheezing shortness of breath  HPI: Cindy Gutierrez is a 63 y.o. female with medical history significant of COPD, HFpEF, CAD, anxiety/depression, hepatitis C, hyperparathyroidism, HTN, peripheral neuropathy, mitral valve regurgitation, pulmonary hypertension presented with worsening of cough wheezing and shortness of breath.  Symptoms started 2 days ago, patient started develop with dry cough and wheezing and started to feel  very tight in the chest and cannot breathe in, but denied any chest pain no fever or chills.  Overnight, could not sleep because of shortness of breath and called EMS.  EMS arrived and found patient in severe respiratory distress and put patient on 6 L, patient declined CPAP  ED Course: Afebrile, nontachycardic, tachypneic blood pressure 140/80 O2 saturation 100% on BiPAP.  Patient tolerated however only brief time on BiPAP.  Chest x-ray negative for acute infiltrates, blood work showed WBC 8.5 hemoglobin 13 bicarb 24 BUN 20 creatinine 0.7 glucose 158.  Patient was given IV Solu-Medrol  albuterol  in the ED.  Review of Systems: As per HPI otherwise 14 point review of systems negative.    Past Medical History:  Diagnosis Date   Aortic valve regurgitation    CHF (congestive heart failure) (HCC)    COPD (chronic obstructive pulmonary disease) (HCC)    Coronary artery disease    Depression    Hepatitis C    Hyperparathyroidism    Hypertension    Mitral valve regurgitation    Neuropathy    to R arm only   Non-toxic multinodular goiter    Pulmonary hypertension (HCC) 2022   noted on CT   Pulmonary nodule     Thyroid  disease     Past Surgical History:  Procedure Laterality Date   COLONOSCOPY WITH PROPOFOL  N/A 08/21/2018   Procedure: COLONOSCOPY WITH PROPOFOL ;  Surgeon: Unk Corinn Skiff, MD;  Location: [facility] ENDOSCOPY;  Service: Gastroenterology;  Laterality: N/A;   PARATHYROIDECTOMY Left    TUBAL LIGATION       reports that she quit smoking about 30 years ago. Her smoking use included cigarettes. She started smoking about 2 years ago. She has never used smokeless tobacco. She reports that she does not drink alcohol and does not use drugs.  Allergies  Allergen Reactions   Sulfa Antibiotics Hives and Rash   Bee Pollen Hives   Hydrochlorothiazide      Chest tightness   Lisinopril Nausea Only   Losartan     Penicillins Hives   Penicillins    Sulfa Antibiotics    Tiotropium Bromide     Other reaction(s): Cough   Carvedilol     Family History  Problem Relation Age of Onset   Alzheimer's disease Mother    Heart disease Father      Prior to Admission medications   Medication Sig Start Date End Date Taking? Authorizing Provider  acetaminophen  (TYLENOL ) 325 MG tablet Take 650 mg by mouth 3 (three) times daily as needed for mild pain (pain score 1-3).   Yes [provider]  albuterol  (PROVENTIL ) (2.5 MG/3ML) 0.083% nebulizer solution Take 2.5 mg by nebulization every 4 (four) hours as needed for wheezing or shortness of breath.   Yes [provider]  albuterol  (  VENTOLIN  HFA) 108 (90 Base) MCG/ACT inhaler Inhale 2 puffs into the lungs every 6 (six) hours as needed for wheezing or shortness of breath. 10/19/23  Yes Wouk, Devaughn Sayres, MD  aspirin  81 MG chewable tablet Chew 81 mg by mouth daily. 04/13/22  Yes [provider]  budesonide -formoterol  (SYMBICORT ) 160-4.5 MCG/ACT inhaler Inhale 2 puffs into the lungs 2 (two) times daily.   Yes [provider]  calcium  carbonate (TUMS - DOSED IN MG ELEMENTAL CALCIUM ) 500 MG chewable tablet Chew 1 tablet by mouth  daily. As needed   Yes [provider]  celecoxib  (CELEBREX ) 200 MG capsule Take 200 mg by mouth 2 (two) times daily.   Yes [provider]  escitalopram  (LEXAPRO ) 10 MG tablet Take 10 mg by mouth daily. 09/25/23  Yes [provider]  furosemide  (LASIX ) 20 MG tablet Take 1 tablet (20 mg total) by mouth daily. 04/18/24  Yes Wieting, Richard, MD  pregabalin  (LYRICA ) 150 MG capsule Take 150 mg by mouth 2 (two) times daily.   Yes [provider]  rosuvastatin  (CRESTOR ) 20 MG tablet Take 20 mg by mouth daily.   Yes [provider]  spironolactone  (ALDACTONE ) 25 MG tablet Take 1 tablet (25 mg total) by mouth daily. 04/19/24  Yes Wieting, Richard, MD  amLODipine  (NORVASC ) 10 MG tablet Take 1 tablet (10 mg total) by mouth daily. Patient not taking: Reported on 07/22/2024 10/20/23   Wouk, Devaughn Sayres, MD  dapagliflozin  propanediol (FARXIGA ) 10 MG TABS tablet Take 1 tablet (10 mg total) by mouth daily. Patient not taking: Reported on 07/22/2024 04/19/24   Josette Ade, MD  predniSONE  (DELTASONE ) 10 MG tablet 4 tabs po daily for two days Patient not taking: Reported on 07/22/2024 04/19/24   Josette Ade, MD    Physical Exam: Vitals:   07/22/24 0630 07/22/24 0700 07/22/24 0720 07/22/24 0730  BP: (!) 152/70 (!) 162/71  139/83  Pulse: 79 90 78 80  Resp: 19 (!) 23 15 18   Temp:      TempSrc:      SpO2: 100% 100% 100% 100%  Gutierrez:      Height:        Constitutional: NAD, calm, comfortable Vitals:   07/22/24 0630 07/22/24 0700 07/22/24 0720 07/22/24 0730  BP: (!) 152/70 (!) 162/71  139/83  Pulse: 79 90 78 80  Resp: 19 (!) 23 15 18   Temp:      TempSrc:      SpO2: 100% 100% 100% 100%  Gutierrez:      Height:       Eyes: PERRL, lids and conjunctivae normal ENMT: Mucous membranes are moist. Posterior pharynx clear of any exudate or lesions.Normal dentition.  Neck: normal, supple, no masses, no thyromegaly Respiratory: Diminished breathing sound  bilaterally, scattered wheezing, no crackles.  Creasing respiratory effort. No accessory muscle use.  Cardiovascular: Regular rate and rhythm, no murmurs / rubs / gallops. No extremity edema. 2+ pedal pulses. No carotid bruits.  Abdomen: no tenderness, no masses palpated. No hepatosplenomegaly. Bowel sounds positive.  Musculoskeletal: no clubbing / cyanosis. No joint deformity upper and lower extremities. Good ROM, no contractures. Normal muscle tone.  Skin: no rashes, lesions, ulcers. No induration Neurologic: CN 2-12 grossly intact. Sensation intact, DTR normal. Strength 5/5 in all 4.  Psychiatric: Normal judgment and insight. Alert and oriented x 3. Normal mood.     Labs on Admission: I have personally reviewed following labs and imaging studies  CBC: Recent Labs  Lab 07/22/24 0538  WBC 9.4  NEUTROABS 6.2  HGB 12.0  HCT 38.5  MCV 90.0  PLT 235   Basic Metabolic Panel: Recent Labs  Lab 07/22/24 0538  NA 146*  K 3.5  CL 111  CO2 26  GLUCOSE 133*  BUN 15  CREATININE 0.73  CALCIUM  8.4*   GFR: Estimated Creatinine Clearance: 76.8 mL/min (by C-G formula based on SCr of 0.73 mg/dL). Liver Function Tests: Recent Labs  Lab 07/22/24 0538  AST 16  ALT 10  ALKPHOS 80  BILITOT 0.4  PROT 6.5  ALBUMIN 3.7   No results for input(s): LIPASE, AMYLASE in the last 168 hours. No results for input(s): AMMONIA in the last 168 hours. Coagulation Profile: No results for input(s): INR, PROTIME in the last 168 hours. Cardiac Enzymes: No results for input(s): CKTOTAL, CKMB, CKMBINDEX, TROPONINI in the last 168 hours. BNP (last 3 results) No results for input(s): PROBNP in the last 8760 hours. HbA1C: No results for input(s): HGBA1C in the last 72 hours. CBG: No results for input(s): GLUCAP in the last 168 hours. Lipid Profile: No results for input(s): CHOL, HDL, LDLCALC, TRIG, CHOLHDL, LDLDIRECT in the last 72 hours. Thyroid  Function Tests: No  results for input(s): TSH, T4TOTAL, FREET4, T3FREE, THYROIDAB in the last 72 hours. Anemia Panel: No results for input(s): VITAMINB12, FOLATE, FERRITIN, TIBC, IRON , RETICCTPCT in the last 72 hours. Urine analysis:    Component Value Date/Time   COLORURINE YELLOW (A) 11/18/2023 1223   APPEARANCEUR CLEAR (A) 11/18/2023 1223   LABSPEC 1.016 11/18/2023 1223   PHURINE 5.0 11/18/2023 1223   GLUCOSEU NEGATIVE 11/18/2023 1223   HGBUR NEGATIVE 11/18/2023 1223   BILIRUBINUR NEGATIVE 11/18/2023 1223   KETONESUR NEGATIVE 11/18/2023 1223   PROTEINUR 100 (A) 11/18/2023 1223   NITRITE NEGATIVE 11/18/2023 1223   LEUKOCYTESUR NEGATIVE 11/18/2023 1223    Radiological Exams on Admission: DG Chest Portable 1 View Result Date: 07/22/2024 EXAM: 1 VIEW(S) XRAY OF THE CHEST 07/22/2024 05:52:00 AM COMPARISON: 04/14/2024 CLINICAL HISTORY: evalutae for pleural edema. Pt to ED via [facility] with c/o resp. Distress. Per EMS pt has hx of COPD, received 2 duonebs and 1.5in nitroglycerin  paste en route, however paste removed due to pt's BP dropping. Per EMS pt refused CPAP and IV access en route. ; Reason for exam: evalutae for pleural edema FINDINGS: LUNGS AND PLEURA: Hyperinflated lungs. Chronic interstitial coarsening. No focal pulmonary opacity. No pulmonary edema. No pleural effusion. No pneumothorax. HEART AND MEDIASTINUM: Surgical clips at thoracic inlet. No acute abnormality of the cardiac and mediastinal silhouettes. BONES AND SOFT TISSUES: Remote left rib fractures. No acute osseous abnormality. IMPRESSION: 1. No acute findings. 2. Hyperinflated lungs and chronic interstitial coarsening, consistent with history of COPD. Electronically signed by: Evalene Coho MD 07/22/2024 06:25 AM EDT RP Workstation: [id number]    EKG: Independently reviewed.  Sinus rhythm, no acute ST changes.  Assessment/Plan Principal Problem:   COPD (chronic obstructive pulmonary disease) (HCC) Active Problems:    Acute hypoxic respiratory failure (HCC)   COPD with acute exacerbation (HCC)  (please populate well all problems here in Problem List. (For example, if patient is on BP meds at home and you resume or decide to hold them, it is a problem that needs to be her. Same for CAD, COPD, HLD and so on)  Acute hypoxic respiratory failure Acute COPD exacerbation - Continue IV Solu-Medrol  - Try to persuade patient to go back to BiPAP to relieve breathing effort, patient however declined again. - ICS and LABA - DuoNebs and  as needed albuterol  - Incentive spirometry and flutter valve - Doxycycline  twice daily - Check atypical infectious study including Legionella and mycoplasma - Other DDx, patient appears to be euvolemic, no acute infiltrates on x-ray, low suspicion for CHF,/  Elevated troponins - No chest pain, EKG showed no acute ST changes, ACS unlikely. - Trending of troponins 12> 57, appears to be flat in pattern, likely reflect demanding ischemia from acute hypoxia  Chronic HFpEF Chronic mitral regurgitation - Euvolemic - As needed Lasix   Peripheral neuropathy - Continue Lyrica   DVT prophylaxis: Lovenox  Code Status: Full code Family Communication: Left sister message Disposition Plan: Patient is sick with acute COPD exacerbation requiring BiPAP, and IV steroid, expect more than 2 midnight hospital stay. Consults called: None Admission status: Telemetry admission   Cort ONEIDA Mana MD [facility] Pager [phone]  07/22/2024, 8:33 AM

## 2024-07-22 NOTE — ED Provider Notes (Signed)
 [facility] Provider Note    Event Date/Time   First MD Initiated Contact with Patient 07/22/24 0522     (approximate)   History   Respiratory Distress   HPI  Cindy Gutierrez is a 63 y.o. female  with pmh of COPD (on 2L at baseline), CHF, coronary artery disease, depression, hepatitis C, hyperparathyroidism, hypertension, mitral valve regurgitation, and pulmonary hypertension, who presented to the emergency room with acute onset of acute respiratory distress with worsening dyspnea for the past 24 hours.  Reports that she cannot get her breath.  Denies any chest pain.  Denies any fevers abdominal pain or swelling in her legs.  She reports compliance with her medications.  Per EMS she was satting in the low 80s upon arrival on her 2 L.  She required 6 L and route.  She did receive a DuoNeb and nitroglycerin  paste.  Patient reports that she remains without being able to catch her breath      Physical Exam   Triage Vital Signs: ED Triage Vitals  Encounter Vitals Group     BP      Girls Systolic BP Percentile      Girls Diastolic BP Percentile      Boys Systolic BP Percentile      Boys Diastolic BP Percentile      Pulse      Resp      Temp      Temp src      SpO2      Weight      Height      Head Circumference      Peak Flow      Pain Score      Pain Loc      Pain Education      Exclude from Growth Chart     Most recent vital signs: Vitals:   07/22/24 0700 07/22/24 0720  BP: (!) 162/71   Pulse: 90 78  Resp: (!) 23 15  Temp:    SpO2: 100% 100%    Nursing Triage Note reviewed. Vital signs reviewed and patients oxygen saturation is hypoxic  General: Patient is well nourished, well developed, awake and alert, appears in distress Head: Normocephalic and atraumatic Eyes: Normal inspection, extraocular muscles intact, no conjunctival pallor Ear, nose, throat: Normal external exam Neck: Normal range of motion Respiratory: Patient is in moderate  respiratory distress, not moving good air Cardiovascular: Patient is tachycardic, RR GI: Abd SNT with no guarding or rebound  Back: Normal inspection of the back with good strength and range of motion throughout all ext Extremities: pulses intact with good cap refills, no LE pitting edema or calf tenderness Neuro: The patient is alert and oriented to person, place, and time, appropriately conversive, with 5/5 bilat UE/LE strength, no gross motor or sensory defects noted. Coordination appears to be adequate. Skin: Warm, dry, and intact Psych: anxious mood and affect, no SI or HI  ED Results / Procedures / Treatments   Labs (all labs ordered are listed, but only abnormal results are displayed) Labs Reviewed  COMPREHENSIVE METABOLIC PANEL WITH GFR - Abnormal; Notable for the following components:      Result Value   Sodium 146 (*)    Glucose, Bld 133 (*)    Calcium  8.4 (*)    All other components within normal limits  BLOOD GAS, VENOUS - Abnormal; Notable for the following components:   pO2, Ven 65 (*)    All other components within  normal limits  BRAIN NATRIURETIC PEPTIDE - Abnormal; Notable for the following components:   B Natriuretic Peptide 545.2 (*)    All other components within normal limits  RESP PANEL BY RT-PCR (RSV, FLU A&B, COVID)  [id number]  CULTURE, BLOOD (ROUTINE X 2)  CULTURE, BLOOD (ROUTINE X 2)  CBC WITH DIFFERENTIAL/PLATELET  LACTIC ACID, PLASMA  TROPONIN I (HIGH SENSITIVITY)  TROPONIN I (HIGH SENSITIVITY)     EKG EKG and rhythm strip are interpreted by myself:   EKG: [Normal sinus rhythm] at heart rate of 84, normal QRS duration, QTc 492, nonspecific ST segments and T waves no ectopy EKG not consistent with Acute STEMI Rhythm strip: NSR in lead II   RADIOLOGY CXR: No acute findings on my independent review interpretation and radiologist agrees    PROCEDURES:  Critical Care performed: Yes, see critical care procedure note(s)  .Critical  Care  Performed by: Nicholaus Rolland BRAVO, MD Authorized by: Nicholaus Rolland BRAVO, MD   Critical care provider statement:    Critical care time (minutes):  35   Critical care was necessary to treat or prevent imminent or life-threatening deterioration of the following conditions:  Respiratory failure   Critical care was time spent personally by me on the following activities:  Development of treatment plan with patient or surrogate, discussions with consultants, evaluation of patient's response to treatment, examination of patient, ordering and review of laboratory studies, ordering and review of radiographic studies, ordering and performing treatments and interventions, pulse oximetry, re-evaluation of patient's condition and review of old charts Comments:     Management of BiPAP    MEDICATIONS ORDERED IN ED: Medications  celecoxib  (CELEBREX ) capsule 200 mg (has no administration in time range)  acetaminophen  (TYLENOL ) tablet 1,000 mg (has no administration in time range)  methylPREDNISolone  sodium succinate (SOLU-MEDROL ) 125 mg/2 mL injection 125 mg (125 mg Intravenous Given 07/22/24 0539)  albuterol  (PROVENTIL ) (2.5 MG/3ML) 0.083% nebulizer solution 2.5 mg (2.5 mg Nebulization Given 07/22/24 0540)     IMPRESSION / MDM / ASSESSMENT AND PLAN / ED COURSE                                Differential diagnosis includes, but is not limited to, COPD exacerbation, CHF, pneumonia, sepsis, electrolyte derangement   ED course: Patient arrives in moderate respiratory distress with a respiratory rate close to 35.  She was placed on BiPAP and does appear more comfortable with heart rate normalizing.  Surprisingly she was not particularly hypercarbic and she had no profound leukocytosis or anemia and lactic acid was not elevated.  I have provided another nebulizing treatment and 125 mg of Solu-Medrol .  Anticipate patient will require admission today   Clinical Course as of 07/22/24 0728  Mon Jul 22, 2024   0615 Lactic Acid, Venous: 1.3 Not elevated [HD]  0616 CBC with Differential No leukocytosis [HD]  0726 B Natriuretic Peptide(!): 545.2 Not as elevated as previous [HD]  0726 Blood gas, venous(!) Not hypercarbic [HD]  0726 Lactic Acid, Venous: 1.3 Not elevated [HD]  9273 Patient has now been on BiPAP since arrival and appears much more comfortable.  She and I feel comfortable weaning again to nasal cannula.  Nursing is working on this currently.  Case discussed with hospitalist for admission [HD]    Clinical Course User Index [HD] Nicholaus Rolland BRAVO, MD   -- Risk: 5 This patient has a high risk of morbidity due to further diagnostic  testing or treatment. Rationale: This patient's evaluation and management involve a high risk of morbidity due to the potential severity of presenting symptoms, need for diagnostic testing, and/or initiation of treatment that may require close monitoring. The differential includes conditions with potential for significant deterioration or requiring escalation of care. Treatment decisions in the ED, including medication administration, procedural interventions, or disposition planning, reflect this level of risk. COPA: 5 The patient has the following acute or chronic illness/injury that poses a possible threat to life or bodily function: [X] : The patient has a potentially serious acute condition or an acute exacerbation of a chronic illness requiring urgent evaluation and management in the Emergency Department. The clinical presentation necessitates immediate consideration of life-threatening or function-threatening diagnoses, even if they are ultimately ruled out.   FINAL CLINICAL IMPRESSION(S) / ED DIAGNOSES   Final diagnoses:  COPD exacerbation (HCC)  Hypoxia     Rx / DC Orders   ED Discharge Orders     None        Note:  This document was prepared using Dragon voice recognition software and may include unintentional dictation errors.   Nicholaus Rolland BRAVO, MD 07/22/24 [phone]

## 2024-07-22 NOTE — ED Notes (Signed)
 Pt transitioned off bipap and onto 4L [place]

## 2024-07-22 NOTE — ED Notes (Signed)
 RN at bedside. Pt requesting at home pain medication for chronic pain. EDP made aware.

## 2024-07-22 NOTE — ED Triage Notes (Signed)
 Pt to ED via [facility] with c/o resp. Distress. Per EMS pt has hx of COPD, received 2 duonebs and 1.5in nitroglycerin  paste en route, however paste removed due to pt's BP dropping. Per EMS pt refused CPAP and IV access en route.   Per EMS most recent VS:  130/90 92HR 99% on 6L   Pt with noted difficulty speaking on arrival .

## 2024-07-22 NOTE — ED Notes (Signed)
[place] at bedside for echo.

## 2024-07-22 NOTE — ED Provider Notes (Signed)
 Patient requesting her home Celebrex  and Tylenol  which she takes daily for chronic arthritis pain   Dicky Anes, MD 07/22/24 [phone]

## 2024-07-23 ENCOUNTER — Inpatient Hospital Stay

## 2024-07-23 DIAGNOSIS — J9601 Acute respiratory failure with hypoxia: Secondary | ICD-10-CM

## 2024-07-23 DIAGNOSIS — J441 Chronic obstructive pulmonary disease with (acute) exacerbation: Secondary | ICD-10-CM | POA: Diagnosis not present

## 2024-07-23 LAB — BASIC METABOLIC PANEL WITH GFR
Anion gap: 16 — ABNORMAL HIGH (ref 5–15)
BUN: 16 mg/dL (ref 8–23)
CO2: 22 mmol/L (ref 22–32)
Calcium: 8.6 mg/dL — ABNORMAL LOW (ref 8.9–10.3)
Chloride: 106 mmol/L (ref 98–111)
Creatinine, Ser: 0.69 mg/dL (ref 0.44–1.00)
GFR, Estimated: >60 mL/min (ref >60)
Glucose, Bld: 174 mg/dL — ABNORMAL HIGH (ref 70–99)
Potassium: 3.9 mmol/L (ref 3.5–5.1)
Sodium: 144 mmol/L (ref 135–145)

## 2024-07-23 LAB — CBC
HCT: 36.9 % (ref 36.0–46.0)
Hemoglobin: 11.5 g/dL — ABNORMAL LOW (ref 12.0–15.0)
MCH: 27.4 pg (ref 26.0–34.0)
MCHC: 31.2 g/dL (ref 30.0–36.0)
MCV: 87.9 fL (ref 80.0–100.0)
Platelets: 206 K/uL (ref 150–400)
RBC: 4.2 MIL/uL (ref 3.87–5.11)
RDW: 13.9 % (ref 11.5–15.5)
WBC: 8.1 K/uL (ref 4.0–10.5)
nRBC: 0 % (ref 0.0–0.2)

## 2024-07-23 LAB — HEPARIN LEVEL (UNFRACTIONATED)
Heparin Unfractionated: 0.4 [IU]/mL (ref 0.30–0.70)
Heparin Unfractionated: 0.43 [IU]/mL (ref 0.30–0.70)

## 2024-07-23 LAB — MYCOPLASMA PNEUMONIAE ANTIBODY, IGM: Mycoplasma pneumo IgM: <770 U/mL (ref 0–769)

## 2024-07-23 MED ORDER — METOPROLOL SUCCINATE ER 25 MG PO TB24
12.5000 mg | ORAL_TABLET | Freq: Every day | ORAL | Status: DC
  Administered 2024-07-23 – 2024-07-27 (×5): 12.5 mg via ORAL
  Filled 2024-07-23 (×5): qty 1

## 2024-07-23 MED ORDER — ACETAMINOPHEN 325 MG PO TABS
650.0000 mg | ORAL_TABLET | Freq: Three times a day (TID) | ORAL | Status: DC | PRN
  Administered 2024-07-23 – 2024-07-27 (×8): 650 mg via ORAL
  Filled 2024-07-23 (×11): qty 2

## 2024-07-23 MED ORDER — LABETALOL HCL 5 MG/ML IV SOLN: 10.0000 mg | INTRAVENOUS | Status: DC | PRN

## 2024-07-23 MED ORDER — ISOSORB DINITRATE-HYDRALAZINE 20-37.5 MG PO TABS
1.0000 | ORAL_TABLET | Freq: Three times a day (TID) | ORAL | Status: DC
  Administered 2024-07-23 – 2024-07-27 (×14): 1 via ORAL
  Filled 2024-07-23 (×15): qty 1

## 2024-07-23 MED ORDER — HYDRALAZINE HCL 20 MG/ML IJ SOLN: 10.0000 mg | Freq: Four times a day (QID) | INTRAMUSCULAR | Status: DC | PRN

## 2024-07-23 MED ORDER — FUROSEMIDE 10 MG/ML IJ SOLN
40.0000 mg | Freq: Two times a day (BID) | INTRAMUSCULAR | Status: AC
  Administered 2024-07-23 (×2): 40 mg via INTRAVENOUS
  Filled 2024-07-23 (×2): qty 4

## 2024-07-23 MED ORDER — HYDROXYZINE HCL 25 MG PO TABS
25.0000 mg | ORAL_TABLET | Freq: Three times a day (TID) | ORAL | Status: DC | PRN
  Administered 2024-07-25 – 2024-07-26 (×2): 25 mg via ORAL
  Filled 2024-07-23 (×2): qty 1

## 2024-07-23 NOTE — Progress Notes (Signed)
 Patient resting comfortably in bed. No signs of distress or anxiety. Breathing not labored.

## 2024-07-23 NOTE — Progress Notes (Signed)
 MEWS Progress Note  Patient Details Name: Cindy Gutierrez MRN: [id number] DOB: October 15, 1960 Today's Date: 07/23/2024   MEWS Flowsheet Documentation:  Assess: MEWS Score Temp: 98.8 F (37.1 C) BP: (!) 182/64 MAP (mmHg): 106 Pulse Rate: 62 ECG Heart Rate: 82 Resp: 18 Level of Consciousness: Alert SpO2: 100 % O2 Device: Nasal Cannula Patient Activity (if Appropriate): In bed O2 Flow Rate (L/min): 6 L/min FiO2 (%): 40 % Assess: MEWS Score MEWS Temp: 0 MEWS Systolic: 0 MEWS Pulse: 0 MEWS RR: 0 MEWS LOC: 0 MEWS Score: 0 MEWS Score Color: Green Assess: SIRS CRITERIA SIRS Temperature : 0 SIRS Respirations : 0 SIRS Pulse: 0 SIRS WBC: 0 SIRS Score Sum : 0 Assess: if the MEWS score is Yellow or Red Were vital signs accurate and taken at a resting state?: No, vital signs rechecked Does the patient have a confirmed or suspected source of infection?: No Notify: Charge Nurse/RN Name of Charge Nurse/RN Notified: Emad Brechtel paraschos    See additional progress note  Taevon Aschoff Doyal Dooms 07/23/2024, 8:41 AM

## 2024-07-23 NOTE — Consult Note (Signed)
 [facility] CLINIC CARDIOLOGY CONSULT NOTE       Patient ID: Cindy Gutierrez MRN: [id number] DOB/AGE: 64-Jan-1962 64 y.o.  Admit date: 07/22/2024 Referring Physician Dr. Cort Mana Primary Physician Administration, [facility] Cardiologist Dr. Florencio Reason for Consultation abnormal echo  HPI: Cindy Gutierrez is a 63 y.o. female  with a past medical history of chronic HFpEF, coronary artery disease, mitral regurgitation, pulm hypertension, COPD, hypertension, tobacco use, depression, hepatitis C who presented to the ED on 07/22/2024 for cough, wheezing, shortness of breath.  Echo done 07/22/2024 revealed reduced EF and WMA's.  Cardiology was consulted for further evaluation.   Patient reports she was having worsening SOB before coming in to the ED. Workup in the ED notable for creatinine 0.73, potassium 3.5, hemoglobin 12.0, WBC 9.4. Troponins 12 > 57 > 92, BNP 545. EKG in the ED normal sinus rhythm rate 91 bpm, no acute ischemic changes. CXR without acute abnormality.   At the time of my evaluation, she is resting in bed. States she is still feeling SOB and endorses chest congestion. She reports that she SOB will be better at some times and worse at others. Endorses orthopnea. Denies any clear chest pain or angina.  Review of systems complete and found to be negative unless listed above    Past Medical History:  Diagnosis Date   Aortic valve regurgitation    CHF (congestive heart failure) (HCC)    COPD (chronic obstructive pulmonary disease) (HCC)    Coronary artery disease    Depression    Hepatitis C    Hyperparathyroidism    Hypertension    Mitral valve regurgitation    Neuropathy    to R arm only   Non-toxic multinodular goiter    Pulmonary hypertension (HCC) 2022   noted on CT   Pulmonary nodule    Thyroid  disease     Past Surgical History:  Procedure Laterality Date   COLONOSCOPY WITH PROPOFOL  N/A 08/21/2018   Procedure: COLONOSCOPY WITH PROPOFOL ;  Surgeon: Unk Corinn Skiff, MD;  Location: [facility] ENDOSCOPY;  Service: Gastroenterology;  Laterality: N/A;   PARATHYROIDECTOMY Left    TUBAL LIGATION      Medications Prior to Admission  Medication Sig Dispense Refill Last Dose/Taking   acetaminophen  (TYLENOL ) 325 MG tablet Take 650 mg by mouth 3 (three) times daily as needed for mild pain (pain score 1-3).   Unknown   albuterol  (PROVENTIL ) (2.5 MG/3ML) 0.083% nebulizer solution Take 2.5 mg by nebulization every 4 (four) hours as needed for wheezing or shortness of breath.   Unknown   albuterol  (VENTOLIN  HFA) 108 (90 Base) MCG/ACT inhaler Inhale 2 puffs into the lungs every 6 (six) hours as needed for wheezing or shortness of breath. 8 g 2 Unknown   aspirin  81 MG chewable tablet Chew 81 mg by mouth daily.   Past Week   budesonide -formoterol  (SYMBICORT ) 160-4.5 MCG/ACT inhaler Inhale 2 puffs into the lungs 2 (two) times daily.   07/21/2024   calcium  carbonate (TUMS - DOSED IN MG ELEMENTAL CALCIUM ) 500 MG chewable tablet Chew 1 tablet by mouth daily. As needed   Unknown   celecoxib  (CELEBREX ) 200 MG capsule Take 200 mg by mouth 2 (two) times daily.   07/21/2024   escitalopram  (LEXAPRO ) 10 MG tablet Take 10 mg by mouth daily.   Unknown   furosemide  (LASIX ) 20 MG tablet Take 1 tablet (20 mg total) by mouth daily. 30 tablet 0 07/20/2024   pregabalin  (LYRICA ) 150 MG capsule Take 150  mg by mouth 2 (two) times daily.   07/21/2024   rosuvastatin  (CRESTOR ) 20 MG tablet Take 20 mg by mouth daily.   Past Week   spironolactone  (ALDACTONE ) 25 MG tablet Take 1 tablet (25 mg total) by mouth daily. 30 tablet 0 Past Week   amLODipine  (NORVASC ) 10 MG tablet Take 1 tablet (10 mg total) by mouth daily. (Patient not taking: Reported on 07/22/2024) 30 tablet 1 Not Taking   dapagliflozin  propanediol (FARXIGA ) 10 MG TABS tablet Take 1 tablet (10 mg total) by mouth daily. (Patient not taking: Reported on 07/22/2024) 30 tablet 0 Not Taking   predniSONE  (DELTASONE ) 10 MG tablet 4 tabs po  daily for two days (Patient not taking: Reported on 07/22/2024) 8 tablet 0 Not Taking   Social History   Socioeconomic History   Marital status: Single    Spouse name: Not on file   Number of children: Not on file   Years of education: Not on file   Highest education level: Not on file  Occupational History   Not on file  Tobacco Use   Smoking status: Former    Types: Cigarettes    Start date: 2023    Quit date: 12/04/1993    Years since quitting: 30.6   Smokeless tobacco: Never  Vaping Use   Vaping status: Never Used  Substance and Sexual Activity   Alcohol use: Never   Drug use: Never   Sexual activity: Not Currently  Other Topics Concern   Not on file  Social History Narrative   ** Merged History Encounter **       Social Drivers of Health   Financial Resource Strain: High Risk (04/25/2024)   Received from [facility]   Overall Financial Resource Strain (CARDIA)    Difficulty of Paying Living Expenses: Hard  Food Insecurity: No Food Insecurity (07/22/2024)   Hunger Vital Sign    Worried About Running Out of Food in the Last Year: Never true    Ran Out of Food in the Last Year: Never true  Transportation Needs: No Transportation Needs (07/22/2024)   PRAPARE - Administrator, Civil Service (Medical): No    Lack of Transportation (Non-Medical): No  Physical Activity: Not on file  Stress: Not on file  Social Connections: Moderately Isolated (07/22/2024)   Social Connection and Isolation Panel    Frequency of Communication with Friends and Family: More than three times a week    Frequency of Social Gatherings with Friends and Family: More than three times a week    Attends Religious Services: More than 4 times per year    Active Member of Clubs or Organizations: No    Attends Banker Meetings: Never    Marital Status: Never married  Intimate Partner Violence: Not At Risk (07/22/2024)   Humiliation, Afraid, Rape, and Kick  questionnaire    Fear of Current or Ex-Partner: No    Emotionally Abused: No    Physically Abused: No    Sexually Abused: No    Family History  Problem Relation Age of Onset   Alzheimer's disease Mother    Heart disease Father      Vitals:   07/22/24 1929 07/22/24 2055 07/23/24 0001 07/23/24 0342  BP: (!) 176/65 (!) 163/65 (!) 156/65 (!) 173/68  Pulse: 81 73 63 62  Resp: 20 (!) 24 18 20   Temp: 98.8 F (37.1 C) 99 F (37.2 C) 98.3 F (36.8 C) 98.5 F (36.9 C)  TempSrc:  Oral    SpO2: 100% 99% 100% 98%  Weight:      Height:        PHYSICAL EXAM General: Chronically ill appearing female, well nourished, in no acute distress. HEENT: Normocephalic and atraumatic. Neck: No JVD.  Lungs: Normal respiratory effort on 6 L Hoffman. Heart: HRRR. Normal S1 and S2 without gallops or murmurs.  Abdomen: Non-distended appearing.  Msk: Normal strength and tone for age. Extremities: Warm and well perfused. No clubbing, cyanosis. Trace edema.  Neuro: Alert and oriented X 3. Psych: Answers questions appropriately.   Labs: Basic Metabolic Panel: Recent Labs    07/22/24 0538 07/23/24 0532  NA 146* 144  K 3.5 3.9  CL 111 106  CO2 26 22  GLUCOSE 133* 174*  BUN 15 16  CREATININE 0.73 0.69  CALCIUM  8.4* 8.6*   Liver Function Tests: Recent Labs    07/22/24 0538  AST 16  ALT 10  ALKPHOS 80  BILITOT 0.4  PROT 6.5  ALBUMIN 3.7   No results for input(s): LIPASE, AMYLASE in the last 72 hours. CBC: Recent Labs    07/22/24 0538 07/23/24 0532  WBC 9.4 8.1  NEUTROABS 6.2  --   HGB 12.0 11.5*  HCT 38.5 36.9  MCV 90.0 87.9  PLT 235 206   Cardiac Enzymes: Recent Labs    07/22/24 0538 07/22/24 0731 07/22/24 1021  TROPONINIHS 12 57* 92*   BNP: Recent Labs    07/22/24 0538  BNP 545.2*   D-Dimer: No results for input(s): DDIMER in the last 72 hours. Hemoglobin A1C: No results for input(s): HGBA1C in the last 72 hours. Fasting Lipid Panel: No results for  input(s): CHOL, HDL, LDLCALC, TRIG, CHOLHDL, LDLDIRECT in the last 72 hours. Thyroid  Function Tests: No results for input(s): TSH, T4TOTAL, T3FREE, THYROIDAB in the last 72 hours.  Invalid input(s): FREET3 Anemia Panel: No results for input(s): VITAMINB12, FOLATE, FERRITIN, TIBC, IRON , RETICCTPCT in the last 72 hours.   Radiology: ECHOCARDIOGRAM COMPLETE Result Date: 07/22/2024    ECHOCARDIOGRAM REPORT   Patient Name:   Cindy Gutierrez Date of Exam: 07/22/2024 Medical Rec #:  [id number]     Height:       70.0 in Accession #:    [id number]    Weight:       170.0 lb Date of Birth:  04-Mar-1961     BSA:          1.948 m Patient Age:    63 years      BP:           139/83 mmHg Patient Gender: F             HR:           80 bpm. Exam Location:  [facility] Procedure: 2D Echo, Cardiac Doppler and Color Doppler (Both Spectral and Color            Flow Doppler were utilized during procedure). Indications:     Elevated troponin  History:         Patient has prior history of Echocardiogram examinations, most                  recent 10/17/2023. CHF, COPD and Pulmonary HTN; Risk                  Factors:Hypertension.  Sonographer:     Christopher Furnace Referring Phys:  [id number] CORT ONEIDA MANA Diagnosing Phys: Keller Alluri IMPRESSIONS  1. Left ventricular ejection fraction, by  estimation, is 40%. The left ventricle has moderately decreased function. The left ventricle demonstrates regional wall motion abnormalities (see scoring diagram/findings for description). There is moderate left  ventricular hypertrophy. Left ventricular diastolic parameters are consistent with Grade I diastolic dysfunction (impaired relaxation).  2. Right ventricular systolic function is normal. The right ventricular size is normal.  3. The mitral valve is normal in structure. Mild mitral valve regurgitation.  4. The aortic valve was not well visualized. Aortic valve regurgitation is mild.  5. The inferior vena cava is normal in  size with greater than 50% respiratory variability, suggesting right atrial pressure of 3 mmHg. FINDINGS  Left Ventricle: Left ventricular ejection fraction, by estimation, is 40%. The left ventricle has moderately decreased function. The left ventricle demonstrates regional wall motion abnormalities. The left ventricular internal cavity size was normal in size. There is moderate left ventricular hypertrophy. Left ventricular diastolic parameters are consistent with Grade I diastolic dysfunction (impaired relaxation).  LV Wall Scoring: The mid anteroseptal segment, mid inferolateral segment, mid anterolateral segment, mid inferoseptal segment, mid anterior segment, and mid inferior segment are hypokinetic. The entire apex, basal anteroseptal segment, basal inferolateral segment, basal anterolateral segment, basal anterior segment, basal inferior segment, and basal inferoseptal segment are normal. Right Ventricle: The right ventricular size is normal. No increase in right ventricular wall thickness. Right ventricular systolic function is normal. Left Atrium: Left atrial size was normal in size. Right Atrium: Right atrial size was normal in size. Pericardium: There is no evidence of pericardial effusion. Mitral Valve: The mitral valve is normal in structure. Mild mitral valve regurgitation. MV peak gradient, 10.0 mmHg. The mean mitral valve gradient is 4.0 mmHg. Tricuspid Valve: The tricuspid valve is normal in structure. Tricuspid valve regurgitation is mild. Aortic Valve: The aortic valve was not well visualized. Aortic valve regurgitation is mild. Aortic regurgitation PHT measures 445 msec. Aortic valve mean gradient measures 4.3 mmHg. Aortic valve peak gradient measures 8.1 mmHg. Aortic valve area, by VTI measures 3.09 cm. Pulmonic Valve: The pulmonic valve was not well visualized. Pulmonic valve regurgitation is not visualized. Aorta: The aortic root is normal in size and structure. Venous: The inferior vena  cava is normal in size with greater than 50% respiratory variability, suggesting right atrial pressure of 3 mmHg. IAS/Shunts: The atrial septum is grossly normal.  LEFT VENTRICLE PLAX 2D LVIDd:         4.70 cm   Diastology LVIDs:         3.10 cm   LV e' medial:    8.38 cm/s LV PW:         1.40 cm   LV E/e' medial:  9.9 LV IVS:        0.90 cm   LV e' lateral:   9.79 cm/s LVOT diam:     2.30 cm   LV E/e' lateral: 8.5 LV SV:         85 LV SV Index:   44 LVOT Area:     4.15 cm  RIGHT VENTRICLE RV Basal diam:  4.90 cm RV Mid diam:    3.80 cm RV S prime:     11.90 cm/s TAPSE (M-mode): 3.3 cm LEFT ATRIUM             Index        RIGHT ATRIUM           Index LA diam:        2.20 cm 1.13 cm/m   RA Area:  14.10 cm LA Vol (A2C):   45.7 ml 23.46 ml/m  RA Volume:   33.20 ml  17.04 ml/m LA Vol (A4C):   35.6 ml 18.28 ml/m LA Biplane Vol: 41.3 ml 21.20 ml/m  AORTIC VALVE AV Area (Vmax):    3.09 cm AV Area (Vmean):   2.97 cm AV Area (VTI):     3.09 cm AV Vmax:           142.00 cm/s AV Vmean:          98.833 cm/s AV VTI:            0.274 m AV Peak Grad:      8.1 mmHg AV Mean Grad:      4.3 mmHg LVOT Vmax:         105.50 cm/s LVOT Vmean:        70.750 cm/s LVOT VTI:          0.204 m LVOT/AV VTI ratio: 0.74 AI PHT:            445 msec  AORTA Ao Root diam: 2.80 cm MITRAL VALVE                TRICUSPID VALVE MV Area (PHT): 3.89 cm     TR Peak grad:   23.2 mmHg MV Area VTI:   3.22 cm     TR Vmax:        241.00 cm/s MV Peak grad:  10.0 mmHg MV Mean grad:  4.0 mmHg     SHUNTS MV Vmax:       1.58 m/s     Systemic VTI:  0.20 m MV Vmean:      86.7 cm/s    Systemic Diam: 2.30 cm MV Decel Time: 195 msec MV E velocity: 82.80 cm/s MV A velocity: 119.00 cm/s MV E/A ratio:  0.70 Keller Paterson Electronically signed by Keller Paterson Signature Date/Time: 07/22/2024/12:26:42 PM    Final    DG Chest Portable 1 View Result Date: 07/22/2024 EXAM: 1 VIEW(S) XRAY OF THE CHEST 07/22/2024 05:52:00 AM COMPARISON: 04/14/2024 CLINICAL  HISTORY: evalutae for pleural edema. Pt to ED via [facility] with c/o resp. Distress. Per EMS pt has hx of COPD, received 2 duonebs and 1.5in nitroglycerin  paste en route, however paste removed due to pt's BP dropping. Per EMS pt refused CPAP and IV access en route. ; Reason for exam: evalutae for pleural edema FINDINGS: LUNGS AND PLEURA: Hyperinflated lungs. Chronic interstitial coarsening. No focal pulmonary opacity. No pulmonary edema. No pleural effusion. No pneumothorax. HEART AND MEDIASTINUM: Surgical clips at thoracic inlet. No acute abnormality of the cardiac and mediastinal silhouettes. BONES AND SOFT TISSUES: Remote left rib fractures. No acute osseous abnormality. IMPRESSION: 1. No acute findings. 2. Hyperinflated lungs and chronic interstitial coarsening, consistent with history of COPD. Electronically signed by: Evalene Coho MD 07/22/2024 06:25 AM EDT RP Workstation: [id number]    ECHO as above  TELEMETRY (personally reviewed): Sinus rhythm rate 60s  EKG (personally reviewed): normal sinus rhythm rate 91 bpm, no acute ischemic changes  Data reviewed by me 07/23/2024: last 24h vitals tele labs imaging I/O ED provider note, admission H&P, hospitalist progress note  Principal Problem:   COPD (chronic obstructive pulmonary disease) (HCC) Active Problems:   COPD with acute exacerbation (HCC)   Acute hypoxic respiratory failure (HCC)    ASSESSMENT AND PLAN:  Cindy Gutierrez is a 63 y.o. female  with a past medical history of chronic HFpEF, coronary artery disease, mitral regurgitation, pulm hypertension,  COPD, hypertension, tobacco use, depression, hepatitis C who presented to the ED on 07/22/2024 for cough, wheezing, shortness of breath.  Echo done 07/22/2024 revealed reduced EF and WMA's.  Cardiology was consulted for further evaluation.   # New HFrEF # COPD exacerbation  # Acute respiratory failure with hypoxia Patient presents with SOB, cough. BNP elevated at 545. CXR without  significant acute abnormality. Echo this admission with a EF 40%, anteroseptal, mid inferolateral, mid anterolateral, mid inferoseptal, mid anterior, mid inferior hypokinesis noted, grade 1 diastolic dysfunction, mild MR. - Will start BiDil 20-37.5 mg 3 times daily and metoprolol  succinate 12.5 mg daily.  Options limited for GDMT as she has previously not tolerated multiple medications. - Will continue IV lasix  40 mg twice daily. - Continue spironolactone  25 mg daily.  Previously prescribed Farxiga  but has not been taking this due to side effects - did state she would be willing to try Jardiance but not while she is in the Gutierrez. - COPD management per primary team.   # Coronary artery disease # Hypertension Known hx of CAD, LHC 2011 at [facility] - unable to view results. Trops trended 12 > 57 > 92. EKG with sinus rhythm, rate 91 bpm without acute ischemic changes. New WMAs noted on echo this admission as above. ?stress-induced cardiomyopathy - Will plan for LHC for additional evaluation prior to discharge. Ideally would like her respiratory status improved prior to proceeding with this.  - Documented intolerance to multiple BP medications including lisinopril, losartan , coreg, and recently stopped amlodipine  due to side effects.   This patient's plan of care was discussed and created with Dr. Wilburn and he is in agreement.  Signed: Danita Bloch, PA-C  07/23/2024, 7:20 AM [facility]

## 2024-07-23 NOTE — Progress Notes (Addendum)
 PROGRESS NOTE    Cindy Gutierrez  FMW:[id number] DOB: May 19, 1961 DOA: 07/22/2024 [facility]  Chief Complaint  Patient presents with   Respiratory Distress    Hospital Course:  Cindy Gutierrez is a 62 year old female with COPD, heart failure with preserved EF, CAD, anxiety, depression, hepatitis C, hyperparathyroidism, hypertension, tobacco abuse, peripheral neuropathy, mitral valve regurg, pulmonary hypertension, who presented with worsening shortness of breath patient called EMS who found patient to be in severe respiratory distress requiring 6 L O2.  On arrival she was tachypneic, requiring BiPAP, CXR negative for acute infiltrates, lab work mostly unremarkable.  Patient was admitted for COPD exacerbation and started on Solu-Medrol . Echocardiogram was performed which revealed LVEF reduced to 40% as well as new regional wall motion abnormalities suspicious for ACS.  High-sensitivity troponin demonstrated an uptrend from 52-92.  Cardiology was consulted. Patient was started on heparin  drip and initiated on IV Lasix .   Subjective: This morning pt reports she has had a difficult time sleeping and would like to rest. She endorses mild improved in SOB.   Objective: Vitals:   07/23/24 0342 07/23/24 0752 07/23/24 0800 07/23/24 1104  BP: (!) 173/68 (!) 207/68 (!) 182/64 (!) 182/64  Pulse: 62 80 62 62  Resp: 20  18 14   Temp: 98.5 F (36.9 C)  98.8 F (37.1 C) 98.7 F (37.1 C)  TempSrc:   Oral Oral  SpO2: 98% 100%  98%  Weight:      Height:        Intake/Output Summary (Last 24 hours) at 07/23/2024 1713 Last data filed at 07/22/2024 1900 Gross per 24 hour  Intake 40.69 ml  Output --  Net 40.69 ml   Filed Weights   07/22/24 0524  Weight: 77.1 kg    Examination: General exam: Appears calm and comfortable, NAD  Respiratory system: tachypnea, rhonchi diffusely, on 4L Cardiovascular system: S1 & S2 heard, RRR.  Gastrointestinal system: Abdomen is nondistended, soft  and nontender.  Neuro: Alert and oriented. No focal neurological deficits. Extremities: Symmetric, expected ROM Skin: No rashes, lesions Psychiatry: Demonstrates appropriate judgement and insight. Mood & affect appropriate for situation.   Assessment & Plan:  Principal Problem:   COPD (chronic obstructive pulmonary disease) (HCC) Active Problems:   Acute hypoxic respiratory failure (HCC)   COPD with acute exacerbation (HCC)   Acute hypoxic respiratory failure COPD with acute exacerbation -- Cont supplemental O2, wean as able. Pt unable to tolerate Bipap - Continue Solu-Medrol  --Pt is improving now and O2 down to 3L, but will consider CTA if continued desaturation - Continue scheduled inhalers and as needed DuoNebs - Encourage incentive parameter and flutter valve - On doxycycline , will continue - Legionella and mycoplasma pending  Acute heart failure with reduced EF - New diagnosis for this patient - On IV diuresis per cardiology.  Follow strict I's and O's - EF 40% with regional wall motion abnormalities, grade 1 diastolic dysfunction - Has been initiated on GDMT, somewhat limited by extensive allergy profile  CAD - Known history of CAD.  Left heart cath at [facility] 2011 - Troponin 12->57->92 this admission.  EKG without ischemic changes - New regional wall motion abnormalities, cardiology consulted - There is concern of stress-induced cardiomyopathy - Cardiology planning for left heart cath later this admission when respiratory status improves  Hypertensive urgency HTN - Blood pressure very uncontrolled earlier with SBP over 200 - Patient has documented intolerance and allergies to multiple different blood pressure medications including ACE, ARB, Coreg.  She  reports she recently discontinued amlodipine  due to side effects - Blood pressure better controlled now - Will continue to increase and titrate medications as patient can tolerate --PRN hydralazine  and  labetolol  History of tobacco abuse - Quit smoking 30 years ago.  Anxiety Depression - Continue home meds - May require intermittent Ativan  for breakthrough anxiety.  Peripheral neuropathy - Continue home meds  Mitral valve regurg Pulmonary hypertension - Continue home meds.  Cardiology consulted as above.  Hepatitis C Hyperparathyroidism -- Cont home meds   DVT prophylaxis: Heparin  drip per ACS protocol   Code Status: Full Code Disposition:  Pending clinical improvement. Cardiology planning for [facility] during this admission  Consultants:    Procedures:    Antimicrobials:  Anti-infectives (From admission, onward)    Start     Dose/Rate Route Frequency Ordered Stop   07/22/24 0900  doxycycline  (VIBRAMYCIN ) 100 mg in sodium chloride  0.9 % 250 mL IVPB        100 mg 125 mL/hr over 120 Minutes Intravenous Every 12 hours 07/22/24 0801         Data Reviewed: I have personally reviewed following labs and imaging studies CBC: Recent Labs  Lab 07/22/24 0538 07/23/24 0532  WBC 9.4 8.1  NEUTROABS 6.2  --   HGB 12.0 11.5*  HCT 38.5 36.9  MCV 90.0 87.9  PLT 235 206   Basic Metabolic Panel: Recent Labs  Lab 07/22/24 0538 07/23/24 0532  NA 146* 144  K 3.5 3.9  CL 111 106  CO2 26 22  GLUCOSE 133* 174*  BUN 15 16  CREATININE 0.73 0.69  CALCIUM  8.4* 8.6*   GFR: Estimated Creatinine Clearance: 76.8 mL/min (by C-G formula based on SCr of 0.69 mg/dL). Liver Function Tests: Recent Labs  Lab 07/22/24 0538  AST 16  ALT 10  ALKPHOS 80  BILITOT 0.4  PROT 6.5  ALBUMIN 3.7   CBG: No results for input(s): GLUCAP in the last 168 hours.  Recent Results (from the past 240 hours)  Resp panel by RT-PCR (RSV, Flu A&B, Covid) Anterior Nasal Swab     Status: None   Collection Time: 07/22/24  5:38 AM   Specimen: Anterior Nasal Swab  Result Value Ref Range Status   SARS Coronavirus 2 by RT PCR NEGATIVE NEGATIVE Final    Comment: (NOTE) SARS-CoV-2 target nucleic  acids are NOT DETECTED.  The SARS-CoV-2 RNA is generally detectable in upper respiratory specimens during the acute phase of infection. The lowest concentration of SARS-CoV-2 viral copies this assay can detect is 138 copies/mL. A negative result does not preclude SARS-Cov-2 infection and should not be used as the sole basis for treatment or other patient management decisions. A negative result may occur with  improper specimen collection/handling, submission of specimen other than nasopharyngeal swab, presence of viral mutation(s) within the areas targeted by this assay, and inadequate number of viral copies(<138 copies/mL). A negative result must be combined with clinical observations, patient history, and epidemiological information. The expected result is Negative.  Fact Sheet for Patients:  BloggerCourse.com  Fact Sheet for Healthcare Providers:  SeriousBroker.it  This test is no t yet approved or cleared by the [place]  FDA and  has been authorized for detection and/or diagnosis of SARS-CoV-2 by FDA under an Emergency Use Authorization (EUA). This EUA will remain  in effect (meaning this test can be used) for the duration of the COVID-19 declaration under Section 564(b)(1) of the Act, 21 U.S.C.section 360bbb-3(b)(1), unless the authorization is terminated  or  revoked sooner.       Influenza A by PCR NEGATIVE NEGATIVE Final   Influenza B by PCR NEGATIVE NEGATIVE Final    Comment: (NOTE) The Xpert Xpress SARS-CoV-2/FLU/RSV plus assay is intended as an aid in the diagnosis of influenza from Nasopharyngeal swab specimens and should not be used as a sole basis for treatment. Nasal washings and aspirates are unacceptable for Xpert Xpress SARS-CoV-2/FLU/RSV testing.  Fact Sheet for Patients: BloggerCourse.com  Fact Sheet for Healthcare Providers: SeriousBroker.it  This  test is not yet approved or cleared by the [place]  FDA and has been authorized for detection and/or diagnosis of SARS-CoV-2 by FDA under an Emergency Use Authorization (EUA). This EUA will remain in effect (meaning this test can be used) for the duration of the COVID-19 declaration under Section 564(b)(1) of the Act, 21 U.S.C. section 360bbb-3(b)(1), unless the authorization is terminated or revoked.     Resp Syncytial Virus by PCR NEGATIVE NEGATIVE Final    Comment: (NOTE) Fact Sheet for Patients: BloggerCourse.com  Fact Sheet for Healthcare Providers: SeriousBroker.it  This test is not yet approved or cleared by the [place]  FDA and has been authorized for detection and/or diagnosis of SARS-CoV-2 by FDA under an Emergency Use Authorization (EUA). This EUA will remain in effect (meaning this test can be used) for the duration of the COVID-19 declaration under Section 564(b)(1) of the Act, 21 U.S.C. section 360bbb-3(b)(1), unless the authorization is terminated or revoked.  Performed at [facility], [address] [place], [place] [address]   Blood culture (routine x 2)     Status: None (Preliminary result)   Collection Time: 07/22/24  5:38 AM   Specimen: BLOOD  Result Value Ref Range Status   Specimen Description BLOOD BLOOD RIGHT WRIST  Final   Special Requests   Final    BOTTLES DRAWN AEROBIC AND ANAEROBIC Blood Culture adequate volume   Culture   Final    NO GROWTH 1 DAY Performed at [facility], [address] [place], [place] [address]    Report Status PENDING  Incomplete  Blood culture (routine x 2)     Status: None (Preliminary result)   Collection Time: 07/22/24  5:38 AM   Specimen: BLOOD  Result Value Ref Range Status   Specimen Description BLOOD BLOOD LEFT FOREARM  Final   Special Requests   Final    BOTTLES DRAWN AEROBIC AND ANAEROBIC Blood Culture results may not be optimal  due to an inadequate volume of blood received in culture bottles   Culture   Final    NO GROWTH 1 DAY Performed at [facility], [address] [place], [place] [address]    Report Status PENDING  Incomplete     Radiology Studies: DG Chest 1 View Result Date: 07/23/2024 EXAM: 1 VIEW(S) XRAY OF THE CHEST 07/23/2024 05:04:26 AM COMPARISON: 07/22/2024 CLINICAL HISTORY: CHF (congestive heart failure) (HCC) 97293. CHF. FINDINGS: LUNGS AND PLEURA: Chronic hyperinflation. Bilateral chronic coarsened interstitial markings. No focal pulmonary opacity. No pulmonary edema. No pleural effusion. No pneumothorax. HEART AND MEDIASTINUM: Surgical clips at thoracic inlet. No acute abnormality of the cardiac and mediastinal silhouettes. BONES AND SOFT TISSUES: Old left rib fractures. No acute osseous abnormality. IMPRESSION: 1. No acute findings. 2. Chronic hyperinflation. 3. Bilateral chronic coarsened interstitial markings. Electronically signed by: Donnice Mania MD 07/23/2024 08:51 AM EDT RP Workstation: [id number]   ECHOCARDIOGRAM COMPLETE Result Date: 07/22/2024    ECHOCARDIOGRAM REPORT   Patient Name:   Cindy Gutierrez  [facility] Date of Exam: 07/22/2024 Medical Rec #:  [id number]     Height:       70.0 in Accession #:    [id number]    Weight:       170.0 lb Date of Birth:  [birth year]     BSA:          1.948 m Patient Age:    63 years      BP:           139/83 mmHg Patient Gender: F             HR:           80 bpm. Exam Location:  [facility] Procedure: 2D Echo, Cardiac Doppler and Color Doppler (Both Spectral and Color            Flow Doppler were utilized during procedure). Indications:     Elevated troponin  History:         Patient has prior history of Echocardiogram examinations, most                  recent 10/17/2023. CHF, COPD and Pulmonary HTN; Risk                  Factors:Hypertension.  Sonographer:     Christopher Furnace Referring Phys:  [id number] CORT ONEIDA MANA Diagnosing Phys: Keller Alluri IMPRESSIONS  1. Left  ventricular ejection fraction, by estimation, is 40%. The left ventricle has moderately decreased function. The left ventricle demonstrates regional wall motion abnormalities (see scoring diagram/findings for description). There is moderate left  ventricular hypertrophy. Left ventricular diastolic parameters are consistent with Grade I diastolic dysfunction (impaired relaxation).  2. Right ventricular systolic function is normal. The right ventricular size is normal.  3. The mitral valve is normal in structure. Mild mitral valve regurgitation.  4. The aortic valve was not well visualized. Aortic valve regurgitation is mild.  5. The inferior vena cava is normal in size with greater than 50% respiratory variability, suggesting right atrial pressure of 3 mmHg. FINDINGS  Left Ventricle: Left ventricular ejection fraction, by estimation, is 40%. The left ventricle has moderately decreased function. The left ventricle demonstrates regional wall motion abnormalities. The left ventricular internal cavity size was normal in size. There is moderate left ventricular hypertrophy. Left ventricular diastolic parameters are consistent with Grade I diastolic dysfunction (impaired relaxation).  LV Wall Scoring: The mid anteroseptal segment, mid inferolateral segment, mid anterolateral segment, mid inferoseptal segment, mid anterior segment, and mid inferior segment are hypokinetic. The entire apex, basal anteroseptal segment, basal inferolateral segment, basal anterolateral segment, basal anterior segment, basal inferior segment, and basal inferoseptal segment are normal. Right Ventricle: The right ventricular size is normal. No increase in right ventricular wall thickness. Right ventricular systolic function is normal. Left Atrium: Left atrial size was normal in size. Right Atrium: Right atrial size was normal in size. Pericardium: There is no evidence of pericardial effusion. Mitral Valve: The mitral valve is normal in structure.  Mild mitral valve regurgitation. MV peak gradient, 10.0 mmHg. The mean mitral valve gradient is 4.0 mmHg. Tricuspid Valve: The tricuspid valve is normal in structure. Tricuspid valve regurgitation is mild. Aortic Valve: The aortic valve was not well visualized. Aortic valve regurgitation is mild. Aortic regurgitation PHT measures 445 msec. Aortic valve mean gradient measures 4.3 mmHg. Aortic valve peak gradient measures 8.1 mmHg. Aortic valve area, by VTI measures 3.09 cm. Pulmonic Valve: The pulmonic valve was not well visualized. Pulmonic valve regurgitation is  not visualized. Aorta: The aortic root is normal in size and structure. Venous: The inferior vena cava is normal in size with greater than 50% respiratory variability, suggesting right atrial pressure of 3 mmHg. IAS/Shunts: The atrial septum is grossly normal.  LEFT VENTRICLE PLAX 2D LVIDd:         4.70 cm   Diastology LVIDs:         3.10 cm   LV e' medial:    8.38 cm/s LV PW:         1.40 cm   LV E/e' medial:  9.9 LV IVS:        0.90 cm   LV e' lateral:   9.79 cm/s LVOT diam:     2.30 cm   LV E/e' lateral: 8.5 LV SV:         85 LV SV Index:   44 LVOT Area:     4.15 cm  RIGHT VENTRICLE RV Basal diam:  4.90 cm RV Mid diam:    3.80 cm RV S prime:     11.90 cm/s TAPSE (M-mode): 3.3 cm LEFT ATRIUM             Index        RIGHT ATRIUM           Index LA diam:        2.20 cm 1.13 cm/m   RA Area:     14.10 cm LA Vol (A2C):   45.7 ml 23.46 ml/m  RA Volume:   33.20 ml  17.04 ml/m LA Vol (A4C):   35.6 ml 18.28 ml/m LA Biplane Vol: 41.3 ml 21.20 ml/m  AORTIC VALVE AV Area (Vmax):    3.09 cm AV Area (Vmean):   2.97 cm AV Area (VTI):     3.09 cm AV Vmax:           142.00 cm/s AV Vmean:          98.833 cm/s AV VTI:            0.274 m AV Peak Grad:      8.1 mmHg AV Mean Grad:      4.3 mmHg LVOT Vmax:         105.50 cm/s LVOT Vmean:        70.750 cm/s LVOT VTI:          0.204 m LVOT/AV VTI ratio: 0.74 AI PHT:            445 msec  AORTA Ao Root diam: 2.80 cm  MITRAL VALVE                TRICUSPID VALVE MV Area (PHT): 3.89 cm     TR Peak grad:   23.2 mmHg MV Area VTI:   3.22 cm     TR Vmax:        241.00 cm/s MV Peak grad:  10.0 mmHg MV Mean grad:  4.0 mmHg     SHUNTS MV Vmax:       1.58 m/s     Systemic VTI:  0.20 m MV Vmean:      86.7 cm/s    Systemic Diam: 2.30 cm MV Decel Time: 195 msec MV E velocity: 82.80 cm/s MV A velocity: 119.00 cm/s MV E/A ratio:  0.70 Keller Paterson Electronically signed by Keller Paterson Signature Date/Time: 07/22/2024/12:26:42 PM    Final    DG Chest Portable 1 View Result Date: 07/22/2024 EXAM: 1 VIEW(S) XRAY OF THE CHEST 07/22/2024 05:52:00 AM COMPARISON:  04/14/2024 CLINICAL HISTORY: evalutae for pleural edema. Pt to ED via [facility] with c/o resp. Distress. Per EMS pt has hx of COPD, received 2 duonebs and 1.5in nitroglycerin  paste en route, however paste removed due to pt's BP dropping. Per EMS pt refused CPAP and IV access en route. ; Reason for exam: evalutae for pleural edema FINDINGS: LUNGS AND PLEURA: Hyperinflated lungs. Chronic interstitial coarsening. No focal pulmonary opacity. No pulmonary edema. No pleural effusion. No pneumothorax. HEART AND MEDIASTINUM: Surgical clips at thoracic inlet. No acute abnormality of the cardiac and mediastinal silhouettes. BONES AND SOFT TISSUES: Remote left rib fractures. No acute osseous abnormality. IMPRESSION: 1. No acute findings. 2. Hyperinflated lungs and chronic interstitial coarsening, consistent with history of COPD. Electronically signed by: Timothy Berrigan MD 07/22/2024 06:25 AM EDT RP Workstation: [id number]    Scheduled Meds:  albuterol   2.5 mg Nebulization TID   aspirin   81 mg Oral Daily   celecoxib   200 mg Oral BID   escitalopram   10 mg Oral Daily   fluticasone  furoate-vilanterol  1 puff Inhalation Daily   furosemide   40 mg Intravenous Q12H   isosorbide-hydrALAZINE   1 tablet Oral TID   metoprolol  succinate  12.5 mg Oral Daily   predniSONE   40 mg Oral Q breakfast    pregabalin   150 mg Oral BID   rosuvastatin   20 mg Oral Daily   spironolactone   25 mg Oral Daily   Continuous Infusions:  doxycycline  (VIBRAMYCIN ) IV 100 mg (07/23/24 0943)   heparin  900 Units/hr (07/22/24 1745)     LOS: 1 day  MDM: Patient is high risk for one or more organ failure.  They necessitate ongoing hospitalization for continued IV therapies and subsequent lab monitoring. Total time spent interpreting labs and vitals, reviewing the medical record, coordinating care amongst consultants and care team members, directly assessing and discussing care with the patient and/or family: 55 min  Eliana Lueth, DO [facility]  To contact the attending physician between 7A-7P please use Epic Chat. To contact the covering physician during after hours 7P-7A, please review Amion.  07/23/2024, 5:13 PM   *This document has been created with the assistance of dictation software. Please excuse typographical errors. *

## 2024-07-23 NOTE — Progress Notes (Signed)
 PHARMACY - ANTICOAGULATION CONSULT NOTE  Pharmacy Consult for heparin  Indication: chest pain/ACS  Allergies  Allergen Reactions   Sulfa Antibiotics Hives and Rash   Bee Pollen Hives   Hydrochlorothiazide      Chest tightness   Lisinopril Nausea Only   Losartan     Penicillins Hives   Penicillins    Sulfa Antibiotics    Tiotropium Bromide     Other reaction(s): Cough   Carvedilol     Patient Measurements: Height: 5' 10 (177.8 cm) Weight: 77.1 kg (170 lb) IBW/kg (Calculated) : 68.5 HEPARIN  DW (KG): 77.1  Vital Signs: Temp: 98.3 F (36.8 C) (10/21 0001) Temp Source: Oral (10/20 2055) BP: 156/65 (10/21 0001) Pulse Rate: 63 (10/21 0001)  Labs: Recent Labs    07/22/24 0538 07/22/24 0731 07/22/24 1021 07/22/24 1919 07/23/24 0011  HGB 12.0  --   --   --   --   HCT 38.5  --   --   --   --   PLT 235  --   --   --   --   APTT  --   --   --  137*  --   LABPROT  --   --   --  14.0  --   INR  --   --   --  1.0  --   HEPARINUNFRC  --   --   --   --  0.43  CREATININE 0.73  --   --   --   --   TROPONINIHS 12 57* 92*  --   --     Estimated Creatinine Clearance: 76.8 mL/min (by C-G formula based on SCr of 0.73 mg/dL).   Medical History: Past Medical History:  Diagnosis Date   Aortic valve regurgitation    CHF (congestive heart failure) (HCC)    COPD (chronic obstructive pulmonary disease) (HCC)    Coronary artery disease    Depression    Hepatitis C    Hyperparathyroidism    Hypertension    Mitral valve regurgitation    Neuropathy    to R arm only   Non-toxic multinodular goiter    Pulmonary hypertension (HCC) 2022   noted on CT   Pulmonary nodule    Thyroid  disease     Medications:  Scheduled:   albuterol   2.5 mg Nebulization TID   aspirin   81 mg Oral Daily   celecoxib   200 mg Oral BID   escitalopram   10 mg Oral Daily   fluticasone  furoate-vilanterol  1 puff Inhalation Daily   furosemide   40 mg Intravenous Q12H   methylPREDNISolone  (SOLU-MEDROL )  injection  40 mg Intravenous Q12H   Followed by   predniSONE   40 mg Oral Q breakfast   pregabalin   150 mg Oral BID   rosuvastatin   20 mg Oral Daily   spironolactone   25 mg Oral Daily    Assessment:  63 y/o female presenting with shortness of breath. PMH significant for COPD, HFpEF, CAD, anxiety/depression, hepatitis C, hyperparathyroidism, HTN, peripheral neuropathy. In ED, found to have elevated troponin levels. Pharmacy has been consulted to initiate heparin  infusion. Per chart review, patient is not on anticoagulation prior to admission.  Baseline labs: hgb 12.0, plt 235, aPTT & INR ordered  Goal of Therapy:  Heparin  level 0.3-0.7 units/ml Monitor platelets by anticoagulation protocol: Yes  1021 0011 HL 0.43, therapeutic x 1   Plan:  Continue heparin  infusion at 900 units/hr Recheck HL in 6 hrs to confirm CBC daily while therapeutic  Thank  you for involving pharmacy in this patient's care.   Rankin CANDIE Dills, PharmD, MBA 07/23/2024 1:15 AM

## 2024-07-23 NOTE — Plan of Care (Signed)

## 2024-07-23 NOTE — Progress Notes (Signed)
 Incentive spirometry device given to patient. achieved. Education provided. Patient verbalized understanding.

## 2024-07-23 NOTE — Progress Notes (Signed)
 Heart Failure Navigator Progress Note  Assessed for Heart & Vascular TOC clinic readiness.  Patient does not meet criteria due to current [facility] Cardiology patient.   Navigator will sign off at this time.  Roxy Horseman, RN, BSN [facility] Heart Failure Navigator Secure Chat Only

## 2024-07-23 NOTE — Progress Notes (Signed)
 Heart Failure Stewardship Pharmacy Note  PCP: [facility] PCP-Cardiologist: None  HPI: Cindy Gutierrez is a 63 y.o. female with COPD, HFpEF, CAD, anxiety/depression, hepatitis C, hyperparathyroidism, HTN, peripheral neuropathy, mitral valve regurgitation, pulmonary hypertension who presented with wheezing and shortness of breath. On admission, BNP was 545.2, HS-troponin was 57, and lactic acid was 1.3. Chest x-ray noted consistent with COPD, no acute abnormalities.   Pertinent cardiac history: History of LHC in 12/2009 noted, though unable to see results. Reportedly noted CAD. TTE 05/2021 with LVEF of 65-70% and G2DD. TTE 07/22/24 showed LVEF reduced to 40% with moderate LVH, G1DD, mild MR, mild AR.  Pertinent Lab Values: Creatinine  Date Value Ref Range Status  09/24/2013 0.72 0.60 - 1.30 mg/dL Final   Creatinine, Ser  Date Value Ref Range Status  07/23/2024 0.69 0.44 - 1.00 mg/dL Final   BUN  Date Value Ref Range Status  07/23/2024 16 8 - 23 mg/dL Final  87/76/7985 9 7 - 18 mg/dL Final   Potassium  Date Value Ref Range Status  07/23/2024 3.9 3.5 - 5.1 mmol/L Final  09/24/2013 3.9 3.5 - 5.1 mmol/L Final   Sodium  Date Value Ref Range Status  07/23/2024 144 135 - 145 mmol/L Final  09/24/2013 141 136 - 145 mmol/L Final   B Natriuretic Peptide  Date Value Ref Range Status  07/22/2024 545.2 (H) 0.0 - 100.0 pg/mL Final    Comment:    Performed at [facility], [address] [place], [place] [address]   Magnesium   Date Value Ref Range Status  11/20/2023 2.1 1.7 - 2.4 mg/dL Final    Comment:    Performed at [facility], [address] [place], [place] [address]   Hgb A1c MFr Bld  Date Value Ref Range Status  11/18/2023 5.5 4.8 - 5.6 % Final    Comment:    (NOTE) Pre diabetes:          5.7%-6.4%  Diabetes:              >6.4%  Glycemic control for   <7.0% adults with diabetes    TSH  Date Value Ref Range Status  11/18/2023 1.252  0.350 - 4.500 uIU/mL Final    Comment:    Performed by a 3rd Generation assay with a functional sensitivity of <=0.01 uIU/mL. Performed at [facility], [address] [place], [place] [address]     Vital Signs:  Temp:  [98.2 F (36.8 C)-99 F (37.2 C)] 98.5 F (36.9 C) (10/21 0342) Pulse Rate:  [62-81] 62 (10/21 0342) Cardiac Rhythm: Heart block;Other (Comment) (10/20 2054) Resp:  [16-24] 20 (10/21 0342) BP: (156-176)/(60-68) 173/68 (10/21 0342) SpO2:  [97 %-100 %] 98 % (10/21 0342)  Intake/Output Summary (Last 24 hours) at 07/23/2024 0739 Last data filed at 07/22/2024 1900 Gross per 24 hour  Intake 290.69 ml  Output --  Net 290.69 ml    Current Heart Failure Medications:  Loop diuretic: furosemide  40 mg IV BID Beta-Blocker: metoprolol  succinate 12.5 mg daily ACEI/ARB/ARNI: none (previous intolerance) MRA: spironolactone  25 mg daily SGLT2i: none  Other: BiDil 1 tablet   Prior to admission Heart Failure Medications:  Loop diuretic: furosemide  20 mg prn Beta-Blocker: none ACEI/ARB/ARNI: none MRA: spironolactone  25 mg daily SGLT2i: Stopped Farxiga  due to perineal tenderness Other: none  Assessment: 1. 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 NYHA class III-IV symptoms.  -Symptoms: Reports shortness of breath with minimal exertion.  Previously noted productive cough, though cough is now unproductive. Significant LEE noted. Orthopnea present. -Volume: Hypervolemic on exam. Furosemide  40 mg IV BID started, will monitor urine output. Creatinine is WNL.  -Hemodynamics: BP is severely elevated to 200s systolic. HR 60-80s. -BB: Previously did not tolerate carvedilol due to shortness of breath with COPD. Metoprolol  started today at low dose, could potentially use bisoprolol if patient has issues with metoprolol .  -ACEI/ARB/ARNI: Previously had rash with losartan  and other unknown issues with lisinopril.  -MRA:  Continue home spironolactone  25 mg daily.  -SGLT2i: previously experienced perineal tenderness with Farxiga . Patient has Jardiance at home and is willing to try in the future  Plan: 1) Medication changes recommended at this time: -Discussed with cardiology, BiDil added today along with metoprolol .  2) Patient assistance: -Pending  3) Education: - Patient has been educated on current HF medications and potential additions to HF medication regimen - Patient verbalizes understanding that over the next few months, these medication doses may change and more medications may be added to optimize HF regimen - Patient has been educated on basic disease state pathophysiology and goals of therapy  Medication Assistance / Insurance Benefits Check: Does the patient have prescription insurance?    Type of insurance plan:  Does the patient qualify for medication assistance through manufacturers or grants? No  Patient is [facility] medications from [facility]  [facility]      Please do not hesitate to reach out with questions or concerns,  Jaun Bash, PharmD, CPP, BCPS, [facility] Heart Failure Pharmacist  Phone - [phone] 07/23/2024 10:44 AM

## 2024-07-23 NOTE — Progress Notes (Addendum)
 PHARMACY - ANTICOAGULATION CONSULT NOTE  Pharmacy Consult for heparin  Indication: chest pain/ACS  Allergies  Allergen Reactions   Sulfa Antibiotics Hives and Rash   Bee Pollen Hives   Hydrochlorothiazide      Chest tightness   Lisinopril Nausea Only   Losartan     Penicillins Hives   Penicillins    Sulfa Antibiotics    Tiotropium Bromide     Other reaction(s): Cough   Carvedilol     Patient Measurements: Height: 5' 10 (177.8 cm) Weight: 77.1 kg (170 lb) IBW/kg (Calculated) : 68.5 HEPARIN  DW (KG): 77.1  Vital Signs: Temp: 98.5 F (36.9 C) (10/21 0342) Temp Source: Oral (10/20 2055) BP: 173/68 (10/21 0342) Pulse Rate: 62 (10/21 0342)  Labs: Recent Labs    07/22/24 0538 07/22/24 0731 07/22/24 1021 07/22/24 1919 07/23/24 0011 07/23/24 0532 07/23/24 0628  HGB 12.0  --   --   --   --  11.5*  --   HCT 38.5  --   --   --   --  36.9  --   PLT 235  --   --   --   --  206  --   APTT  --   --   --  137*  --   --   --   LABPROT  --   --   --  14.0  --   --   --   INR  --   --   --  1.0  --   --   --   HEPARINUNFRC  --   --   --   --  0.43  --  0.40  CREATININE 0.73  --   --   --   --  0.69  --   TROPONINIHS 12 57* 92*  --   --   --   --     Estimated Creatinine Clearance: 76.8 mL/min (by C-G formula based on SCr of 0.69 mg/dL).   Medical History: Past Medical History:  Diagnosis Date   Aortic valve regurgitation    CHF (congestive heart failure) (HCC)    COPD (chronic obstructive pulmonary disease) (HCC)    Coronary artery disease    Depression    Hepatitis C    Hyperparathyroidism    Hypertension    Mitral valve regurgitation    Neuropathy    to R arm only   Non-toxic multinodular goiter    Pulmonary hypertension (HCC) 2022   noted on CT   Pulmonary nodule    Thyroid  disease     Medications:  Scheduled:   albuterol   2.5 mg Nebulization TID   aspirin   81 mg Oral Daily   celecoxib   200 mg Oral BID   escitalopram   10 mg Oral Daily   fluticasone   furoate-vilanterol  1 puff Inhalation Daily   predniSONE   40 mg Oral Q breakfast   pregabalin   150 mg Oral BID   rosuvastatin   20 mg Oral Daily   spironolactone   25 mg Oral Daily    Assessment:  63 y/o female presenting with shortness of breath. PMH significant for COPD, HFpEF, CAD, anxiety/depression, hepatitis C, hyperparathyroidism, HTN, peripheral neuropathy. In ED, found to have elevated troponin levels. Pharmacy has been consulted to initiate heparin  infusion. Per chart review, patient is not on anticoagulation prior to admission.  1021 0011 HL 0.43, therapeutic x 1 1021 0628 HL 0.4   Goal of Therapy:  Heparin  level 0.3-0.7 units/ml Monitor platelets by anticoagulation protocol: Yes  Plan:  Heparin  level is therapeutic. Will continue heparin  infusion at 900 units/hr. Recheck heparin  level and CBC with AM labs.   Thank you for involving pharmacy in this patient's care.   Cathaleen Blanch, PharmD, 07/23/2024 7:30 AM

## 2024-07-23 NOTE — Progress Notes (Signed)
 Patient up to bedside commode and became very short of breath.  CN and primary RN at bedside and administered a breathing treatment.  02 saturations WDL.  Patient noted to have anxiety and primary RN to notify MD about possible anxiety medication for patient.

## 2024-07-24 DIAGNOSIS — J441 Chronic obstructive pulmonary disease with (acute) exacerbation: Secondary | ICD-10-CM | POA: Diagnosis not present

## 2024-07-24 LAB — COMPREHENSIVE METABOLIC PANEL WITH GFR
ALT: 10 U/L (ref 0–44)
AST: 20 U/L (ref 15–41)
Albumin: 3.7 g/dL (ref 3.5–5.0)
Alkaline Phosphatase: 65 U/L (ref 38–126)
Anion gap: 12 (ref 5–15)
BUN: 24 mg/dL — ABNORMAL HIGH (ref 8–23)
CO2: 31 mmol/L (ref 22–32)
Calcium: 8.3 mg/dL — ABNORMAL LOW (ref 8.9–10.3)
Chloride: 103 mmol/L (ref 98–111)
Creatinine, Ser: 0.85 mg/dL (ref 0.44–1.00)
GFR, Estimated: >60 mL/min (ref >60)
Glucose, Bld: 137 mg/dL — ABNORMAL HIGH (ref 70–99)
Potassium: 4.1 mmol/L (ref 3.5–5.1)
Sodium: 146 mmol/L — ABNORMAL HIGH (ref 135–145)
Total Bilirubin: 0.3 mg/dL (ref 0.0–1.2)
Total Protein: 6.7 g/dL (ref 6.5–8.1)

## 2024-07-24 LAB — CBC WITH DIFFERENTIAL/PLATELET
Abs Immature Granulocytes: 0.17 K/uL — ABNORMAL HIGH (ref 0.00–0.07)
Basophils Absolute: 0 K/uL (ref 0.0–0.1)
Basophils Relative: 0 %
Eosinophils Absolute: 0 K/uL (ref 0.0–0.5)
Eosinophils Relative: 0 %
HCT: 37.7 % (ref 36.0–46.0)
Hemoglobin: 11.5 g/dL — ABNORMAL LOW (ref 12.0–15.0)
Immature Granulocytes: 1 %
Lymphocytes Relative: 5 %
Lymphs Abs: 0.6 K/uL — ABNORMAL LOW (ref 0.7–4.0)
MCH: 27.6 pg (ref 26.0–34.0)
MCHC: 30.5 g/dL (ref 30.0–36.0)
MCV: 90.4 fL (ref 80.0–100.0)
Monocytes Absolute: 0.5 K/uL (ref 0.1–1.0)
Monocytes Relative: 4 %
Neutro Abs: 10.8 K/uL — ABNORMAL HIGH (ref 1.7–7.7)
Neutrophils Relative %: 90 %
Platelets: 246 K/uL (ref 150–400)
RBC: 4.17 MIL/uL (ref 3.87–5.11)
RDW: 14.3 % (ref 11.5–15.5)
WBC: 12.1 K/uL — ABNORMAL HIGH (ref 4.0–10.5)
nRBC: 0 % (ref 0.0–0.2)

## 2024-07-24 LAB — HEPARIN LEVEL (UNFRACTIONATED)
Heparin Unfractionated: 0.23 [IU]/mL — ABNORMAL LOW (ref 0.30–0.70)
Heparin Unfractionated: 0.19 [IU]/mL — ABNORMAL LOW (ref 0.30–0.70)
Heparin Unfractionated: 0.72 [IU]/mL — ABNORMAL HIGH (ref 0.30–0.70)

## 2024-07-24 LAB — MAGNESIUM: Magnesium: 1.8 mg/dL (ref 1.7–2.4)

## 2024-07-24 LAB — PHOSPHORUS: Phosphorus: 4.2 mg/dL (ref 2.5–4.6)

## 2024-07-24 MED ORDER — HEPARIN BOLUS VIA INFUSION
2500.0000 [IU] | Freq: Once | INTRAVENOUS | Status: AC
  Administered 2024-07-24: 2500 [IU] via INTRAVENOUS
  Filled 2024-07-24: qty 2500

## 2024-07-24 MED ORDER — MAGNESIUM SULFATE 2 GM/50ML IV SOLN
2.0000 g | Freq: Once | INTRAVENOUS | Status: AC
  Administered 2024-07-24: 2 g via INTRAVENOUS
  Filled 2024-07-24: qty 50

## 2024-07-24 MED ORDER — HEPARIN BOLUS VIA INFUSION
1150.0000 [IU] | Freq: Once | INTRAVENOUS | Status: AC
  Administered 2024-07-24: 1150 [IU] via INTRAVENOUS
  Filled 2024-07-24: qty 1150

## 2024-07-24 NOTE — Progress Notes (Signed)
 PT Cancellation Note  Patient Details Name: NONI STONESIFER MRN: [id number] DOB: March 27, 1961   Cancelled Treatment:    Reason Eval/Treat Not Completed: Patient not medically ready. Chart reviewed. Pt scheduled to have heart cath placement this admission. Will hold per MD via secure chat.   Francies Inch, SPT  Eduard Penkala 07/24/2024, 9:27 AM

## 2024-07-24 NOTE — Progress Notes (Signed)
 [facility] CLINIC CARDIOLOGY PROGRESS NOTE       Patient ID: Cindy Gutierrez MRN: [id number] DOB/AGE: Jan 20, 1961 63 y.o.  Admit date: 07/22/2024 Referring Physician Dr. Cort Mana Primary Physician Administration, [facility] Cardiologist Dr. Florencio Reason for Consultation abnormal echo  HPI: Cindy Gutierrez is a 63 y.o. female  with a past medical history of chronic HFpEF, coronary artery disease, mitral regurgitation, pulm hypertension, COPD, hypertension, tobacco use, depression, hepatitis C who presented to the ED on 07/22/2024 for cough, wheezing, shortness of breath.  Echo done 07/22/2024 revealed reduced EF and WMA's.  Cardiology was consulted for further evaluation.   Interval history: - Patient seen and examined this morning, sitting upright in hospital bed. - States that her shortness of breath is somewhat improved today.  Still with conversational dyspnea.  Weaned to 4 L nasal cannula. - Endorses good urine output, renal function stable today.  Review of systems complete and found to be negative unless listed above    Past Medical History:  Diagnosis Date   Aortic valve regurgitation    CHF (congestive heart failure) (HCC)    COPD (chronic obstructive pulmonary disease) (HCC)    Coronary artery disease    Depression    Hepatitis C    Hyperparathyroidism    Hypertension    Mitral valve regurgitation    Neuropathy    to R arm only   Non-toxic multinodular goiter    Pulmonary hypertension (HCC) 2022   noted on CT   Pulmonary nodule    Thyroid  disease     Past Surgical History:  Procedure Laterality Date   COLONOSCOPY WITH PROPOFOL  N/A 08/21/2018   Procedure: COLONOSCOPY WITH PROPOFOL ;  Surgeon: Unk Corinn Skiff, MD;  Location: [facility] ENDOSCOPY;  Service: Gastroenterology;  Laterality: N/A;   PARATHYROIDECTOMY Left    TUBAL LIGATION      Medications Prior to Admission  Medication Sig Dispense Refill Last Dose/Taking   acetaminophen  (TYLENOL ) 325 MG tablet  Take 650 mg by mouth 3 (three) times daily as needed for mild pain (pain score 1-3).   Unknown   albuterol  (PROVENTIL ) (2.5 MG/3ML) 0.083% nebulizer solution Take 2.5 mg by nebulization every 4 (four) hours as needed for wheezing or shortness of breath.   Unknown   albuterol  (VENTOLIN  HFA) 108 (90 Base) MCG/ACT inhaler Inhale 2 puffs into the lungs every 6 (six) hours as needed for wheezing or shortness of breath. 8 g 2 Unknown   aspirin  81 MG chewable tablet Chew 81 mg by mouth daily.   Past Week   budesonide -formoterol  (SYMBICORT ) 160-4.5 MCG/ACT inhaler Inhale 2 puffs into the lungs 2 (two) times daily.   07/21/2024   calcium  carbonate (TUMS - DOSED IN MG ELEMENTAL CALCIUM ) 500 MG chewable tablet Chew 1 tablet by mouth daily. As needed   Unknown   celecoxib  (CELEBREX ) 200 MG capsule Take 200 mg by mouth 2 (two) times daily.   07/21/2024   escitalopram  (LEXAPRO ) 10 MG tablet Take 10 mg by mouth daily.   Unknown   furosemide  (LASIX ) 20 MG tablet Take 1 tablet (20 mg total) by mouth daily. 30 tablet 0 07/20/2024   pregabalin  (LYRICA ) 150 MG capsule Take 150 mg by mouth 2 (two) times daily.   07/21/2024   rosuvastatin  (CRESTOR ) 20 MG tablet Take 20 mg by mouth daily.   Past Week   spironolactone  (ALDACTONE ) 25 MG tablet Take 1 tablet (25 mg total) by mouth daily. 30 tablet 0 Past Week   amLODipine  (NORVASC ) 10 MG  tablet Take 1 tablet (10 mg total) by mouth daily. (Patient not taking: Reported on 07/22/2024) 30 tablet 1 Not Taking   dapagliflozin  propanediol (FARXIGA ) 10 MG TABS tablet Take 1 tablet (10 mg total) by mouth daily. (Patient not taking: Reported on 07/22/2024) 30 tablet 0 Not Taking   predniSONE  (DELTASONE ) 10 MG tablet 4 tabs po daily for two days (Patient not taking: Reported on 07/22/2024) 8 tablet 0 Not Taking   Social History   Socioeconomic History   Marital status: Single    Spouse name: Not on file   Number of children: Not on file   Years of education: Not on file   Highest  education level: Not on file  Occupational History   Not on file  Tobacco Use   Smoking status: Former    Types: Cigarettes    Start date: 2023    Quit date: 12/04/1993    Years since quitting: 30.6   Smokeless tobacco: Never  Vaping Use   Vaping status: Never Used  Substance and Sexual Activity   Alcohol use: Never   Drug use: Never   Sexual activity: Not Currently  Other Topics Concern   Not on file  Social History Narrative   ** Merged History Encounter **       Social Drivers of Health   Financial Resource Strain: High Risk (04/25/2024)   Received from [facility]   Overall Financial Resource Strain (CARDIA)    Difficulty of Paying Living Expenses: Hard  Food Insecurity: No Food Insecurity (07/22/2024)   Hunger Vital Sign    Worried About Running Out of Food in the Last Year: Never true    Ran Out of Food in the Last Year: Never true  Transportation Needs: No Transportation Needs (07/22/2024)   PRAPARE - Administrator, Civil Service (Medical): No    Lack of Transportation (Non-Medical): No  Physical Activity: Not on file  Stress: Not on file  Social Connections: Moderately Isolated (07/22/2024)   Social Connection and Isolation Panel    Frequency of Communication with Friends and Family: More than three times a week    Frequency of Social Gatherings with Friends and Family: More than three times a week    Attends Religious Services: More than 4 times per year    Active Member of [employer] or Organizations: No    Attends Banker Meetings: Never    Marital Status: Never married  Intimate Partner Violence: Not At Risk (07/22/2024)   Humiliation, Afraid, Rape, and Kick questionnaire    Fear of Current or Ex-Partner: No    Emotionally Abused: No    Physically Abused: No    Sexually Abused: No    Family History  Problem Relation Age of Onset   Alzheimer's disease Mother    Heart disease Father      Vitals:   07/24/24 0355  07/24/24 0448 07/24/24 0757 07/24/24 0931  BP:  (!) 148/54  (!) 122/44  Pulse:  69  65  Resp:  19  18  Temp:  98.3 F (36.8 C)  98.7 F (37.1 C)  TempSrc:    Oral  SpO2: 99% 100% 98% 99%  Weight:      Height:        PHYSICAL EXAM General: Chronically ill appearing female, well nourished, in no acute distress. HEENT: Normocephalic and atraumatic. Neck: No JVD.  Lungs: Increased respiratory effort on 4L [place]. Heart: HRRR. Normal S1 and S2 without gallops or murmurs.  Abdomen: Non-distended appearing.  Msk: Normal strength and tone for age. Extremities: Warm and well perfused. No clubbing, cyanosis. Trace edema.  Neuro: Alert and oriented X 3. Psych: Answers questions appropriately.   Labs: Basic Metabolic Panel: Recent Labs    07/23/24 0532 07/24/24 0426  NA 144 146*  K 3.9 4.1  CL 106 103  CO2 22 31  GLUCOSE 174* 137*  BUN 16 24*  CREATININE 0.69 0.85  CALCIUM  8.6* 8.3*  MG  --  1.8  PHOS  --  4.2   Liver Function Tests: Recent Labs    07/22/24 0538 07/24/24 0426  AST 16 20  ALT 10 10  ALKPHOS 80 65  BILITOT 0.4 0.3  PROT 6.5 6.7  ALBUMIN 3.7 3.7   No results for input(s): LIPASE, AMYLASE in the last 72 hours. CBC: Recent Labs    07/22/24 0538 07/23/24 0532 07/24/24 0426  WBC 9.4 8.1 12.1*  NEUTROABS 6.2  --  10.8*  HGB 12.0 11.5* 11.5*  HCT 38.5 36.9 37.7  MCV 90.0 87.9 90.4  PLT 235 206 246   Cardiac Enzymes: Recent Labs    07/22/24 0538 07/22/24 0731 07/22/24 1021  TROPONINIHS 12 57* 92*   BNP: Recent Labs    07/22/24 0538  BNP 545.2*   D-Dimer: No results for input(s): DDIMER in the last 72 hours. Hemoglobin A1C: No results for input(s): HGBA1C in the last 72 hours. Fasting Lipid Panel: No results for input(s): CHOL, HDL, LDLCALC, TRIG, CHOLHDL, LDLDIRECT in the last 72 hours. Thyroid  Function Tests: No results for input(s): TSH, T4TOTAL, T3FREE, THYROIDAB in the last 72 hours.  Invalid input(s):  FREET3 Anemia Panel: No results for input(s): VITAMINB12, FOLATE, FERRITIN, TIBC, IRON , RETICCTPCT in the last 72 hours.   Radiology: DG Chest 1 View Result Date: 07/23/2024 EXAM: 1 VIEW(S) XRAY OF THE CHEST 07/23/2024 05:04:26 AM COMPARISON: 07/22/2024 CLINICAL HISTORY: CHF (congestive heart failure) (HCC) 02706. CHF. FINDINGS: LUNGS AND PLEURA: Chronic hyperinflation. Bilateral chronic coarsened interstitial markings. No focal pulmonary opacity. No pulmonary edema. No pleural effusion. No pneumothorax. HEART AND MEDIASTINUM: Surgical clips at thoracic inlet. No acute abnormality of the cardiac and mediastinal silhouettes. BONES AND SOFT TISSUES: Old left rib fractures. No acute osseous abnormality. IMPRESSION: 1. No acute findings. 2. Chronic hyperinflation. 3. Bilateral chronic coarsened interstitial markings. Electronically signed by: Donnice Mania MD 07/23/2024 08:51 AM EDT RP Workstation: [id number]   ECHOCARDIOGRAM COMPLETE Result Date: 07/22/2024    ECHOCARDIOGRAM REPORT   Patient [facility]:   [facility] Date of Exam: 07/22/2024 Medical Rec #:  [id number]     Height:       70.0 in Accession #:    [id number]    Weight:       170.0 lb Date of Birth:  1960/10/27     BSA:          1.948 m Patient Age:    63 years      BP:           139/83 mmHg Patient Gender: F             HR:           80 bpm. Exam Location:  [facility] Procedure: 2D Echo, Cardiac Doppler and Color Doppler (Both Spectral and Color            Flow Doppler were utilized during procedure). Indications:     Elevated troponin  History:         Patient has prior history of  Echocardiogram examinations, most                  recent 10/17/2023. CHF, COPD and Pulmonary HTN; Risk                  Factors:Hypertension.  Sonographer:     Christopher Furnace Referring Phys:  [id number] CORT ONEIDA MANA Diagnosing Phys: Keller Alluri IMPRESSIONS  1. Left ventricular ejection fraction, by estimation, is 40%. The left ventricle has moderately decreased function.  The left ventricle demonstrates regional wall motion abnormalities (see scoring diagram/findings for description). There is moderate left  ventricular hypertrophy. Left ventricular diastolic parameters are consistent with Grade I diastolic dysfunction (impaired relaxation).  2. Right ventricular systolic function is normal. The right ventricular size is normal.  3. The mitral valve is normal in structure. Mild mitral valve regurgitation.  4. The aortic valve was not well visualized. Aortic valve regurgitation is mild.  5. The inferior vena cava is normal in size with greater than 50% respiratory variability, suggesting right atrial pressure of 3 mmHg. FINDINGS  Left Ventricle: Left ventricular ejection fraction, by estimation, is 40%. The left ventricle has moderately decreased function. The left ventricle demonstrates regional wall motion abnormalities. The left ventricular internal cavity size was normal in size. There is moderate left ventricular hypertrophy. Left ventricular diastolic parameters are consistent with Grade I diastolic dysfunction (impaired relaxation).  LV Wall Scoring: The mid anteroseptal segment, mid inferolateral segment, mid anterolateral segment, mid inferoseptal segment, mid anterior segment, and mid inferior segment are hypokinetic. The entire apex, basal anteroseptal segment, basal inferolateral segment, basal anterolateral segment, basal anterior segment, basal inferior segment, and basal inferoseptal segment are normal. Right Ventricle: The right ventricular size is normal. No increase in right ventricular wall thickness. Right ventricular systolic function is normal. Left Atrium: Left atrial size was normal in size. Right Atrium: Right atrial size was normal in size. Pericardium: There is no evidence of pericardial effusion. Mitral Valve: The mitral valve is normal in structure. Mild mitral valve regurgitation. MV peak gradient, 10.0 mmHg. The mean mitral valve gradient is 4.0 mmHg.  Tricuspid Valve: The tricuspid valve is normal in structure. Tricuspid valve regurgitation is mild. Aortic Valve: The aortic valve was not well visualized. Aortic valve regurgitation is mild. Aortic regurgitation PHT measures 445 msec. Aortic valve mean gradient measures 4.3 mmHg. Aortic valve peak gradient measures 8.1 mmHg. Aortic valve area, by VTI measures 3.09 cm. Pulmonic Valve: The pulmonic valve was not well visualized. Pulmonic valve regurgitation is not visualized. Aorta: The aortic root is normal in size and structure. Venous: The inferior vena cava is normal in size with greater than 50% respiratory variability, suggesting right atrial pressure of 3 mmHg. IAS/Shunts: The atrial septum is grossly normal.  LEFT VENTRICLE PLAX 2D LVIDd:         4.70 cm   Diastology LVIDs:         3.10 cm   LV e' medial:    8.38 cm/s LV PW:         1.40 cm   LV E/e' medial:  9.9 LV IVS:        0.90 cm   LV e' lateral:   9.79 cm/s LVOT diam:     2.30 cm   LV E/e' lateral: 8.5 LV SV:         85 LV SV Index:   44 LVOT Area:     4.15 cm  RIGHT VENTRICLE RV Basal diam:  4.90 cm RV Mid  diam:    3.80 cm RV S prime:     11.90 cm/s TAPSE (M-mode): 3.3 cm LEFT ATRIUM             Index        RIGHT ATRIUM           Index LA diam:        2.20 cm 1.13 cm/m   RA Area:     14.10 cm LA Vol (A2C):   45.7 ml 23.46 ml/m  RA Volume:   33.20 ml  17.04 ml/m LA Vol (A4C):   35.6 ml 18.28 ml/m LA Biplane Vol: 41.3 ml 21.20 ml/m  AORTIC VALVE AV Area (Vmax):    3.09 cm AV Area (Vmean):   2.97 cm AV Area (VTI):     3.09 cm AV Vmax:           142.00 cm/s AV Vmean:          98.833 cm/s AV VTI:            0.274 m AV Peak Grad:      8.1 mmHg AV Mean Grad:      4.3 mmHg LVOT Vmax:         105.50 cm/s LVOT Vmean:        70.750 cm/s LVOT VTI:          0.204 m LVOT/AV VTI ratio: 0.74 AI PHT:            445 msec  AORTA Ao Root diam: 2.80 cm MITRAL VALVE                TRICUSPID VALVE MV Area (PHT): 3.89 cm     TR Peak grad:   23.2 mmHg MV Area  VTI:   3.22 cm     TR Vmax:        241.00 cm/s MV Peak grad:  10.0 mmHg MV Mean grad:  4.0 mmHg     SHUNTS MV Vmax:       1.58 m/s     Systemic VTI:  0.20 m MV Vmean:      86.7 cm/s    Systemic Diam: 2.30 cm MV Decel Time: 195 msec MV E velocity: 82.80 cm/s MV A velocity: 119.00 cm/s MV E/A ratio:  0.70 Keller Paterson Electronically signed by Keller Paterson Signature Date/Time: 07/22/2024/12:26:42 PM    Final    DG Chest Portable 1 View Result Date: 07/22/2024 EXAM: 1 VIEW(S) XRAY OF THE CHEST 07/22/2024 05:52:00 AM COMPARISON: 04/14/2024 CLINICAL HISTORY: evalutae for pleural edema. Pt to ED via [facility] with c/o resp. Distress. Per EMS pt has hx of COPD, received 2 duonebs and 1.5in nitroglycerin  paste en route, however paste removed due to pt's BP dropping. Per EMS pt refused CPAP and IV access en route. ; Reason for exam: evalutae for pleural edema FINDINGS: LUNGS AND PLEURA: Hyperinflated lungs. Chronic interstitial coarsening. No focal pulmonary opacity. No pulmonary edema. No pleural effusion. No pneumothorax. HEART AND MEDIASTINUM: Surgical clips at thoracic inlet. No acute abnormality of the cardiac and mediastinal silhouettes. BONES AND SOFT TISSUES: Remote left rib fractures. No acute osseous abnormality. IMPRESSION: 1. No acute findings. 2. Hyperinflated lungs and chronic interstitial coarsening, consistent with history of COPD. Electronically signed by: Evalene Coho MD 07/22/2024 06:25 AM EDT RP Workstation: [id number]    ECHO as above  TELEMETRY (personally reviewed): Sinus rhythm rate 60s  EKG (personally reviewed): normal sinus rhythm rate 91 bpm, no acute ischemic changes  Data reviewed by  me 07/24/2024: last 24h vitals tele labs imaging I/O ED provider note, admission H&P, hospitalist progress note  Principal Problem:   COPD (chronic obstructive pulmonary disease) (HCC) Active Problems:   COPD with acute exacerbation (HCC)   Acute hypoxic respiratory failure (HCC)     ASSESSMENT AND PLAN:  Cindy Gutierrez is a 63 y.o. female  with a past medical history of chronic HFpEF, coronary artery disease, mitral regurgitation, pulm hypertension, COPD, hypertension, tobacco use, depression, hepatitis C who presented to the ED on 07/22/2024 for cough, wheezing, shortness of breath.  Echo done 07/22/2024 revealed reduced EF and WMA's.  Cardiology was consulted for further evaluation.   # New HFrEF # COPD exacerbation  # Acute respiratory failure with hypoxia Patient presents with SOB, cough. BNP elevated at 545. CXR without significant acute abnormality. Echo this admission with a EF 40%, anteroseptal, mid inferolateral, mid anterolateral, mid inferoseptal, mid anterior, mid inferior hypokinesis noted, grade 1 diastolic dysfunction, mild MR. - Continue BiDil 20-37.5 mg 3 times daily and metoprolol  succinate 12.5 mg daily.  Options limited for GDMT as she has previously not tolerated multiple medications. - Will continue IV lasix  40 mg twice daily. - Continue spironolactone  25 mg daily.  Previously prescribed Farxiga  but has not been taking this due to side effects - did state she would be willing to try Jardiance but not while she is in the hospital. - COPD management per primary team.   # Coronary artery disease # Hypertension Known hx of CAD, LHC 2011 at [facility] - unable to view results. Trops trended 12 > 57 > 92. EKG with sinus rhythm, rate 91 bpm without acute ischemic changes. New WMAs noted on echo this admission as above. ?stress-induced cardiomyopathy - Will plan for LHC for additional evaluation prior to discharge. Ideally would like her respiratory status improved prior to proceeding with this.  - Documented intolerance to multiple BP medications including lisinopril, losartan , coreg, and recently stopped amlodipine  due to side effects.   This patient's plan of care was discussed and created with Dr. Wilburn and he is in agreement.  Signed: Danita Bloch, PA-C   07/24/2024, 10:33 AM [facility] Card[facility]

## 2024-07-24 NOTE — Progress Notes (Signed)
 PHARMACY - ANTICOAGULATION CONSULT NOTE  Pharmacy Consult for heparin  Indication: chest pain/ACS  Allergies  Allergen Reactions   Sulfa Antibiotics Hives and Rash   Bee Pollen Hives   Hydrochlorothiazide      Chest tightness   Lisinopril Nausea Only   Losartan     Penicillins Hives   Penicillins    Sulfa Antibiotics    Tiotropium Bromide     Other reaction(s): Cough   Carvedilol     Patient Measurements: Height: 5' 10 (177.8 cm) Weight: 77.1 kg (170 lb) IBW/kg (Calculated) : 68.5 HEPARIN  DW (KG): 77.1  Vital Signs: Temp: 98.3 F (36.8 C) (10/22 0448) BP: 148/54 (10/22 0448) Pulse Rate: 69 (10/22 0448)  Labs: Recent Labs    07/22/24 0538 07/22/24 0731 07/22/24 1021 07/22/24 1919 07/23/24 0011 07/23/24 0532 07/23/24 0628 07/24/24 0426  HGB 12.0  --   --   --   --  11.5*  --  11.5*  HCT 38.5  --   --   --   --  36.9  --  37.7  PLT 235  --   --   --   --  206  --  246  APTT  --   --   --  137*  --   --   --   --   LABPROT  --   --   --  14.0  --   --   --   --   INR  --   --   --  1.0  --   --   --   --   HEPARINUNFRC  --   --   --   --  0.43  --  0.40 0.23*  CREATININE 0.73  --   --   --   --  0.69  --   --   TROPONINIHS 12 57* 92*  --   --   --   --   --     Estimated Creatinine Clearance: 76.8 mL/min (by C-G formula based on SCr of 0.69 mg/dL).   Medical History: Past Medical History:  Diagnosis Date   Aortic valve regurgitation    CHF (congestive heart failure) (HCC)    COPD (chronic obstructive pulmonary disease) (HCC)    Coronary artery disease    Depression    Hepatitis C    Hyperparathyroidism    Hypertension    Mitral valve regurgitation    Neuropathy    to R arm only   Non-toxic multinodular goiter    Pulmonary hypertension (HCC) 2022   noted on CT   Pulmonary nodule    Thyroid  disease     Medications:  Scheduled:   albuterol   2.5 mg Nebulization TID   aspirin   81 mg Oral Daily   celecoxib   200 mg Oral BID   escitalopram   10 mg  Oral Daily   fluticasone  furoate-vilanterol  1 puff Inhalation Daily   heparin   1,150 Units Intravenous Once   isosorbide-hydrALAZINE   1 tablet Oral TID   metoprolol  succinate  12.5 mg Oral Daily   predniSONE   40 mg Oral Q breakfast   pregabalin   150 mg Oral BID   rosuvastatin   20 mg Oral Daily   spironolactone   25 mg Oral Daily    Assessment:  63 y/o female presenting with shortness of breath. PMH significant for COPD, HFpEF, CAD, anxiety/depression, hepatitis C, hyperparathyroidism, HTN, peripheral neuropathy. In ED, found to have elevated troponin levels. Pharmacy has been consulted to initiate heparin  infusion. Per  chart review, patient is not on anticoagulation prior to admission.  1021 0011 HL 0.43, therapeutic x 1 1021 0628 HL 0.4 1022 0426 HL 0.23, SUBtherapeutic    Goal of Therapy:  Heparin  level 0.3-0.7 units/ml Monitor platelets by anticoagulation protocol: Yes    Plan:  10/22:  HL @ 0426 = 0.23, SUBtherapeutic  - Will order heparin  1150 units IV X 1 bolus and increase drip rate to 1050 units/hr - Will recheck HL 6 hrs after rate change - CBC daily  Thank you for involving pharmacy in this patient's care.   Graceson Nichelson D 07/24/2024 5:51 AM

## 2024-07-24 NOTE — Plan of Care (Signed)

## 2024-07-24 NOTE — Progress Notes (Signed)
 PHARMACY - ANTICOAGULATION CONSULT NOTE  Pharmacy Consult for heparin  Indication: chest pain/ACS  Allergies  Allergen Reactions   Sulfa Antibiotics Hives and Rash   Bee Pollen Hives   Hydrochlorothiazide      Chest tightness   Lisinopril Nausea Only   Losartan     Penicillins Hives   Penicillins    Sulfa Antibiotics    Tiotropium Bromide     Other reaction(s): Cough   Carvedilol     Patient Measurements: Height: 5' 10 (177.8 cm) Weight: 77.1 kg (170 lb) IBW/kg (Calculated) : 68.5 HEPARIN  DW (KG): 77.1  Vital Signs: Temp: 98.5 F (36.9 C) (10/22 1104) Temp Source: Oral (10/22 1104) BP: 132/60 (10/22 1104) Pulse Rate: 60 (10/22 1104)  Labs: Recent Labs    07/22/24 0538 07/22/24 0731 07/22/24 1021 07/22/24 1919 07/23/24 0011 07/23/24 0532 07/23/24 0628 07/24/24 0426 07/24/24 1231  HGB 12.0  --   --   --   --  11.5*  --  11.5*  --   HCT 38.5  --   --   --   --  36.9  --  37.7  --   PLT 235  --   --   --   --  206  --  246  --   APTT  --   --   --  137*  --   --   --   --   --   LABPROT  --   --   --  14.0  --   --   --   --   --   INR  --   --   --  1.0  --   --   --   --   --   HEPARINUNFRC  --   --   --   --    < >  --  0.40 0.23* 0.19*  CREATININE 0.73  --   --   --   --  0.69  --  0.85  --   TROPONINIHS 12 57* 92*  --   --   --   --   --   --    < > = values in this interval not displayed.    Estimated Creatinine Clearance: 72.3 mL/min (by C-G formula based on SCr of 0.85 mg/dL).   Medical History: Past Medical History:  Diagnosis Date   Aortic valve regurgitation    CHF (congestive heart failure) (HCC)    COPD (chronic obstructive pulmonary disease) (HCC)    Coronary artery disease    Depression    Hepatitis C    Hyperparathyroidism    Hypertension    Mitral valve regurgitation    Neuropathy    to R arm only   Non-toxic multinodular goiter    Pulmonary hypertension (HCC) 2022   noted on CT   Pulmonary nodule    Thyroid  disease      Medications:  Scheduled:   albuterol   2.5 mg Nebulization TID   aspirin   81 mg Oral Daily   celecoxib   200 mg Oral BID   escitalopram   10 mg Oral Daily   fluticasone  furoate-vilanterol  1 puff Inhalation Daily   isosorbide-hydrALAZINE   1 tablet Oral TID   metoprolol  succinate  12.5 mg Oral Daily   predniSONE   40 mg Oral Q breakfast   pregabalin   150 mg Oral BID   rosuvastatin   20 mg Oral Daily   spironolactone   25 mg Oral Daily    Assessment:  63 y/o  female presenting with shortness of breath. PMH significant for COPD, HFpEF, CAD, anxiety/depression, hepatitis C, hyperparathyroidism, HTN, peripheral neuropathy. In ED, found to have elevated troponin levels. Pharmacy has been consulted to initiate heparin  infusion. Per chart review, patient is not on anticoagulation prior to admission.  1021 0011 HL 0.43, therapeutic x 1 1021 0628 HL 0.4 1022 0426 HL 0.23, SUBtherapeutic  1022 1231 HL 0.19    Goal of Therapy:  Heparin  level 0.3-0.7 units/ml Monitor platelets by anticoagulation protocol: Yes    Plan:  Heparin  level is subtherapeutic. Will give heparin  bolus of 2500 units x 1 and increase the heparin  infusion to 1250 units/hr. Recheck heparin  level in 6 hours. CBC daily while on heparin .   Thank you for involving pharmacy in this patient's care.   Cathaleen GORMAN Blanch, PharmD, BCPS 07/24/2024 1:13 PM

## 2024-07-24 NOTE — TOC Initial Note (Signed)
 Transition of Care [facility]) - Initial/Assessment Note    Patient Details  Name: Cindy Gutierrez MRN: [id number] Date of Birth: 25-May-1961  Transition [facility]) CM/SW Contact:    Cindy Cindy Carpen, LCSW Phone Number: 07/24/2024, 12:22 PM  Clinical Narrative:   Readmission prevention screen complete. CSW met with patient. No family at bedside. CSW introduced role and explained that discharge planning would be discussed. PCP is at the [facility] in [place]. Her son drives her to appointments. She uses the [place] [facility] and they mail her, her medications. Patient lives home with her two sons. No home health or DME use prior to admission other than home oxygen. She is on 2 L oxygen at home through the [facility]. She will ask her son to go ahead and bring her tank to the hospital for the ride home. No further concerns. CSW will continue to follow patient for support and facilitate return home once stable. Son or another family member will transport her home at discharge depending on the day.              Expected Discharge Plan: Home/Self Care Barriers to Discharge: Continued Medical Work up   Patient Goals and CMS Choice            Expected Discharge Plan and Services     Post Acute Care Choice: NA Living arrangements for the past 2 months: Single Family Home                                      Prior Living Arrangements/Services Living arrangements for the past 2 months: Single Family Home Lives with:: Adult Children Patient language and need for interpreter reviewed:: Yes Do you feel safe going back to the place where you live?: Yes      Need for Family Participation in Patient Care: Yes (Comment) Care giver support system in place?: Yes (comment) Current home services: DME Criminal Activity/Legal Involvement Pertinent to Current Situation/Hospitalization: No - Comment as needed  Activities of Daily Living   ADL Screening (condition at time of  admission) Independently performs ADLs?: Yes (appropriate for developmental age) Is the patient deaf or have difficulty hearing?: No Does the patient have difficulty seeing, even when wearing glasses/contacts?: No Does the patient have difficulty concentrating, remembering, or making decisions?: No  Permission Sought/Granted                  Emotional Assessment Appearance:: Appears stated age Attitude/Demeanor/Rapport: Engaged, Gracious Affect (typically observed): Accepting, Appropriate, Calm, Pleasant Orientation: : Oriented to Self, Oriented to Place, Oriented to  Time, Oriented to Situation Alcohol / Substance Use: Not Applicable Psych Involvement: No (comment)  Admission diagnosis:  COPD (chronic obstructive pulmonary disease) (HCC) [J44.9] Hypoxia [R09.02] COPD exacerbation (HCC) [J44.1] Patient Active Problem List   Diagnosis Date Noted   COPD (chronic obstructive pulmonary disease) (HCC) 07/22/2024   Overweight (BMI 25.0-29.9) 04/17/2024   Hypertensive urgency 04/15/2024   Acute on chronic diastolic CHF (congestive heart failure) (HCC) 04/15/2024   Acute respiratory failure with hypoxia and hypercarbia (HCC) 04/14/2024   COVID-19 virus infection 12/05/2023   Cough 12/05/2023   Elevated troponin 12/05/2023   Acute hypoxic respiratory failure (HCC) 11/18/2023   Acute respiratory failure (HCC) 10/16/2023   NSTEMI (non-ST elevated myocardial infarction) (HCC) 10/16/2023   SIRS (systemic inflammatory response syndrome) (HCC) 10/16/2023   Hypertensive emergency 10/16/2023   Dyslipidemia 09/18/2023  Peripheral neuropathy 09/18/2023   Hypokalemia 09/18/2023   Severe sepsis (HCC) 09/24/2022   Influenza A with pneumonia 09/23/2022   Nicotine  dependence 09/23/2022   Onychomycosis 09/23/2022   Hyperglycemia 09/23/2022   Lactic acidosis 09/23/2022   Acute on chronic respiratory failure with hypoxia (HCC) 05/23/2021   Aortic valve regurgitation 05/19/2021   Depression  05/19/2021   Primary hyperparathyroidism 05/19/2021   Lymphedema, not elsewhere classified 05/19/2021   Major depressive disorder, single episode, 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/19/2021   Other chest pain 05/19/2021   Pulmonary nodule 05/19/2021   Viral hepatitis C 04/02/2021   Colon cancer screening    Heart murmur 10/20/2017   COPD with acute exacerbation (HCC) 06/13/2017   Essential hypertension 06/13/2017   Tobacco use 06/13/2017   Herpes simplex 05/31/2017   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   Lipoma 05/30/2017   PCP:  [facility] Pharmacy:   [facility] - [place], [place] - [address] [place] [address] Phone: [phone] Fax: [phone]  [id number] [place], [place] - [address] AT [facility] OF [address] [place] [address] Phone: [phone] Fax: [phone]  [facility] [address] [place] [address] Phone: [phone] Fax: [phone]     Social Drivers of Health (SDOH) Social History: SDOH Screenings   Food Insecurity: No Food Insecurity (07/22/2024)  Housing: Low Risk  (07/22/2024)  Transportation Needs: No Transportation Needs (07/22/2024)  Utilities: Not At Risk (07/22/2024)  Depression (PHQ2-9): High Risk (09/08/2022)  Financial Resource Strain: High Risk (04/25/2024)   Received from [facility]  Social Connections: Moderately Isolated (07/22/2024)  Tobacco Use: Medium Risk (07/22/2024)   SDOH Interventions:     Readmission Risk Interventions    07/24/2024   12:15 PM 04/18/2024   10:30 AM  Readmission Risk Prevention Plan  Transportation Screening Complete Complete  PCP or Specialist Appt within 3-5 Days Complete Complete  Social Work Consult for Recovery Care Planning/Counseling Complete   Palliative Care Screening Not Applicable   Medication Review Oceanographer) Complete Complete

## 2024-07-24 NOTE — Progress Notes (Signed)
 PROGRESS NOTE    Cindy Gutierrez  FMW:[id number] DOB: 1961/02/17 DOA: 07/22/2024 PCP: [facility]  Chief Complaint  Patient presents with   Respiratory Distress    Hospital Course:  Cindy Gutierrez is a 63 year old female with COPD, heart failure with preserved EF, CAD, anxiety, depression, hepatitis C, hyperparathyroidism, hypertension, tobacco abuse, peripheral neuropathy, mitral valve regurg, pulmonary hypertension, who presented with worsening shortness of breath patient called EMS who found patient to be in severe respiratory distress requiring 6 L O2.  On arrival she was tachypneic, requiring BiPAP, CXR negative for acute infiltrates, lab work mostly unremarkable.  Patient was admitted for COPD exacerbation and started on Solu-Medrol . Echocardiogram was performed which revealed LVEF reduced to 40% as well as new regional wall motion abnormalities suspicious for ACS.  High-sensitivity troponin demonstrated an uptrend from 52-92.  Cardiology was consulted. Patient was started on heparin  drip and initiated on IV Lasix .   Subjective: Continues to get short of breath quickly. Remains on 4L [place]> Asymptomatic from vtach.  does not like using breo ellipta  states it has a powder. She is fine with wet mist inhaler. She has not been getting long acting.   Objective: Vitals:   07/24/24 0448 07/24/24 0757 07/24/24 0931 07/24/24 1104  BP: (!) 148/54  (!) 122/44 132/60  Pulse: 69  65 60  Resp: 19  18   Temp: 98.3 F (36.8 C)  98.7 F (37.1 C) 98.5 F (36.9 C)  TempSrc:   Oral Oral  SpO2: 100% 98% 99% 98%  Weight:      Height:        Intake/Output Summary (Last 24 hours) at 07/24/2024 1210 Last data filed at 07/24/2024 1018 Gross per 24 hour  Intake 960 ml  Output 1350 ml  Net -390 ml   Filed Weights   07/22/24 0524  Weight: 77.1 kg    Examination:  Physical Exam  Constitutional: In no distress.  Cardiovascular: Normal rate, regular rhythm. No lower extremity edema   Pulmonary: Non labored breathing on [place], diffuse wheezing.  Abdominal: Soft. Non distended and non tender Musculoskeletal: Normal range of motion.     Neurological: Alert and oriented to person, place, and time. Non focal  Skin: Skin is warm and dry.   Assessment & Plan:  Principal Problem:   COPD (chronic obstructive pulmonary disease) (HCC) Active Problems:   Acute hypoxic respiratory failure (HCC)   COPD with acute exacerbation (HCC)   Acute hypoxic respiratory failure COPD with acute exacerbation --Pt is stable O2 between 3 and 4L will change O2 goal to >88%.  --Legionella pending, mycoplasma negative  -- Will consider CTA chest if continued desaturation --Discussed with patient that she may need to bring her long acting inhaler from home given she does not like formulary equivalent of her home inhaler (she states it is a powder mist) - Continue Solu-Medrol  - Continue scheduled inhalers and as needed DuoNebs - Encourage incentive parameter and flutter valve - On doxycycline  d3, will continue for 5 day course    Acute heart failure with reduced EF - New diagnosis for this patient - On IV diuresis per cardiology.  Follow strict I's and O's - TTE LVEF 40% with regional wall motion abnormalities, grade 1 diastolic dysfunction - Has been initiated on GDMT, somewhat limited by extensive allergy profile  CAD - Known history of CAD.  Left heart cath at [facility] 2011 - Troponin 12->57->92 this admission.  EKG without ischemic changes - New regional wall motion abnormalities, cardiology  consulted - There is concern of stress-induced cardiomyopathy - Cardiology planning for left heart cath later this admission when respiratory status improves  Hypertensive urgency HTN - Blood pressure very uncontrolled earlier with SBP over 200 - Patient has documented intolerance and allergies to multiple different blood pressure medications including ACE, ARB, Coreg.  She reports she recently  discontinued amlodipine  due to side effects - Blood pressure better controlled now - Will continue to increase and titrate medications as patient can tolerate --PRN hydralazine  and labetolol  History of tobacco abuse - Quit smoking 30 years ago.  Anxiety Depression - Continue home meds - May require intermittent Ativan  for breakthrough anxiety.  Peripheral neuropathy - Continue home meds  Mitral valve regurg Pulmonary hypertension - Continue home meds.  Cardiology consulted as above.  Hepatitis C Hyperparathyroidism -- Cont home meds   DVT prophylaxis: Heparin  drip per ACS protocol   Code Status: Full Code Disposition:  Pending clinical improvement. Cardiology planning for [facility] during this admission  Consultants:    Procedures:    Antimicrobials:  Anti-infectives (From admission, onward)    Start     Dose/Rate Route Frequency Ordered Stop   07/22/24 0900  doxycycline  (VIBRAMYCIN ) 100 mg in sodium chloride  0.9 % 250 mL IVPB        100 mg 125 mL/hr over 120 Minutes Intravenous Every 12 hours 07/22/24 0801         Data Reviewed: I have personally reviewed following labs and imaging studies CBC: Recent Labs  Lab 07/22/24 0538 07/23/24 0532 07/24/24 0426  WBC 9.4 8.1 12.1*  NEUTROABS 6.2  --  10.8*  HGB 12.0 11.5* 11.5*  HCT 38.5 36.9 37.7  MCV 90.0 87.9 90.4  PLT 235 206 246   Basic Metabolic Panel: Recent Labs  Lab 07/22/24 0538 07/23/24 0532 07/24/24 0426  NA 146* 144 146*  K 3.5 3.9 4.1  CL 111 106 103  CO2 26 22 31   GLUCOSE 133* 174* 137*  BUN 15 16 24*  CREATININE 0.73 0.69 0.85  CALCIUM  8.4* 8.6* 8.3*  MG  --   --  1.8  PHOS  --   --  4.2   GFR: Estimated Creatinine Clearance: 72.3 mL/min (by C-G formula based on SCr of 0.85 mg/dL). Liver Function Tests: Recent Labs  Lab 07/22/24 0538 07/24/24 0426  AST 16 20  ALT 10 10  ALKPHOS 80 65  BILITOT 0.4 0.3  PROT 6.5 6.7  ALBUMIN 3.7 3.7   CBG: No results for input(s): GLUCAP in  the last 168 hours.  Recent Results (from the past 240 hours)  Resp panel by RT-PCR (RSV, Flu A&B, Covid) Anterior Nasal Swab     Status: None   Collection Time: 07/22/24  5:38 AM   Specimen: Anterior Nasal Swab  Result Value Ref Range Status   SARS Coronavirus 2 by RT PCR NEGATIVE NEGATIVE Final    Comment: (NOTE) SARS-CoV-2 target nucleic acids are NOT DETECTED.  The SARS-CoV-2 RNA is generally detectable in upper respiratory specimens during the acute phase of infection. The lowest concentration of SARS-CoV-2 viral copies this assay can detect is 138 copies/mL. A negative result does not preclude SARS-Cov-2 infection and should not be used as the sole basis for treatment or other patient management decisions. A negative result may occur with  improper specimen collection/handling, submission of specimen other than nasopharyngeal swab, presence of viral mutation(s) within the areas targeted by this assay, and inadequate number of viral copies(<138 copies/mL). A negative result must be  combined with clinical observations, patient history, and epidemiological information. The expected result is Negative.  Fact Sheet for Patients:  BloggerCourse.com  Fact Sheet for Healthcare Providers:  SeriousBroker.it  This test is no t yet approved or cleared by the [place]  FDA and  has been authorized for detection and/or diagnosis of SARS-CoV-2 by FDA under an Emergency Use Authorization (EUA). This EUA will remain  in effect (meaning this test can be used) for the duration of the COVID-19 declaration under Section 564(b)(1) of the Act, 21 U.S.C.section 360bbb-3(b)(1), unless the authorization is terminated  or revoked sooner.       Influenza A by PCR NEGATIVE NEGATIVE Final   Influenza B by PCR NEGATIVE NEGATIVE Final    Comment: (NOTE) The Xpert Xpress SARS-CoV-2/FLU/RSV plus assay is intended as an aid in the diagnosis of  influenza from Nasopharyngeal swab specimens and should not be used as a sole basis for treatment. Nasal washings and aspirates are unacceptable for Xpert Xpress SARS-CoV-2/FLU/RSV testing.  Fact Sheet for Patients: BloggerCourse.com  Fact Sheet for Healthcare Providers: SeriousBroker.it  This test is not yet approved or cleared by the [place]  FDA and has been authorized for detection and/or diagnosis of SARS-CoV-2 by FDA under an Emergency Use Authorization (EUA). This EUA will remain in effect (meaning this test can be used) for the duration of the COVID-19 declaration under Section 564(b)(1) of the Act, 21 U.S.C. section 360bbb-3(b)(1), unless the authorization is terminated or revoked.     Resp Syncytial Virus by PCR NEGATIVE NEGATIVE Final    Comment: (NOTE) Fact Sheet for Patients: BloggerCourse.com  Fact Sheet for Healthcare Providers: SeriousBroker.it  This test is not yet approved or cleared by the [place]  FDA and has been authorized for detection and/or diagnosis of SARS-CoV-2 by FDA under an Emergency Use Authorization (EUA). This EUA will remain in effect (meaning this test can be used) for the duration of the COVID-19 declaration under Section 564(b)(1) of the Act, 21 U.S.C. section 360bbb-3(b)(1), unless the authorization is terminated or revoked.  Performed at [facility], [address] [place], [place] [address]   Blood culture (routine x 2)     Status: None (Preliminary result)   Collection Time: 07/22/24  5:38 AM   Specimen: BLOOD  Result Value Ref Range Status   Specimen Description BLOOD BLOOD RIGHT WRIST  Final   Special Requests   Final    BOTTLES DRAWN AEROBIC AND ANAEROBIC Blood Culture adequate volume   Culture   Final    NO GROWTH 2 DAYS Performed at [facility], [address] [place], [place] [address]     Report Status PENDING  Incomplete  Blood culture (routine x 2)     Status: None (Preliminary result)   Collection Time: 07/22/24  5:38 AM   Specimen: BLOOD  Result Value Ref Range Status   Specimen Description BLOOD BLOOD LEFT FOREARM  Final   Special Requests   Final    BOTTLES DRAWN AEROBIC AND ANAEROBIC Blood Culture results may not be optimal due to an inadequate volume of blood received in culture bottles   Culture   Final    NO GROWTH 2 DAYS Performed at [facility], [address], [place], [place] [address]    Report Status PENDING  Incomplete     Radiology Studies: DG Chest 1 View Result Date: 07/23/2024 EXAM: 1 VIEW(S) XRAY OF THE CHEST 07/23/2024 05:04:26 AM COMPARISON: 07/22/2024 CLINICAL HISTORY: CHF (congestive heart failure) (HCC) 97293. CHF.  FINDINGS: LUNGS AND PLEURA: Chronic hyperinflation. Bilateral chronic coarsened interstitial markings. No focal pulmonary opacity. No pulmonary edema. No pleural effusion. No pneumothorax. HEART AND MEDIASTINUM: Surgical clips at thoracic inlet. No acute abnormality of the cardiac and mediastinal silhouettes. BONES AND SOFT TISSUES: Old left rib fractures. No acute osseous abnormality. IMPRESSION: 1. No acute findings. 2. Chronic hyperinflation. 3. Bilateral chronic coarsened interstitial markings. Electronically signed by: Donnice Mania MD 07/23/2024 08:51 AM EDT RP Workstation: [id number]    Scheduled Meds:  albuterol   2.5 mg Nebulization TID   aspirin   81 mg Oral Daily   celecoxib   200 mg Oral BID   escitalopram   10 mg Oral Daily   fluticasone  furoate-vilanterol  1 puff Inhalation Daily   isosorbide-hydrALAZINE   1 tablet Oral TID   metoprolol  succinate  12.5 mg Oral Daily   predniSONE   40 mg Oral Q breakfast   pregabalin   150 mg Oral BID   rosuvastatin   20 mg Oral Daily   spironolactone   25 mg Oral Daily   Continuous Infusions:  doxycycline  (VIBRAMYCIN ) IV 100 mg (07/23/24 2128)   heparin  1,050 Units/hr (07/24/24  0609)     LOS: 2 days  MDM: Patient is high risk for one or more organ failure.  They necessitate ongoing hospitalization for continued IV therapies and subsequent lab monitoring. Total time spent interpreting labs and vitals, reviewing the medical record, coordinating care amongst consultants and care team members, directly assessing and discussing care with the patient and/or family: 35 min  Alban Pepper, MD [facility]  To contact the attending physician between 7A-7P please use Epic Chat. To contact the covering physician during after hours 7P-7A, please review Amion.  07/24/2024, 12:10 PM   *This document has been created with the assistance of dictation software. Please excuse typographical errors. *

## 2024-07-24 NOTE — Progress Notes (Signed)
 PHARMACY - ANTICOAGULATION CONSULT NOTE  Pharmacy Consult for heparin  Indication: chest pain/ACS  Allergies  Allergen Reactions   Sulfa Antibiotics Hives and Rash   Bee Pollen Hives   Hydrochlorothiazide      Chest tightness   Lisinopril Nausea Only   Losartan     Penicillins Hives   Penicillins    Sulfa Antibiotics    Tiotropium Bromide     Other reaction(s): Cough   Carvedilol     Patient Measurements: Height: 5' 10 (177.8 cm) Weight: 77.1 kg (170 lb) IBW/kg (Calculated) : 68.5 HEPARIN  DW (KG): 77.1  Vital Signs: Temp: 98.6 F (37 C) (10/22 2035) Temp Source: Oral (10/22 1549) BP: 150/53 (10/22 2035) Pulse Rate: 61 (10/22 2035)  Labs: Recent Labs    07/22/24 0538 07/22/24 0731 07/22/24 1021 07/22/24 1919 07/23/24 0011 07/23/24 0532 07/23/24 0628 07/24/24 0426 07/24/24 1231 07/24/24 2005  HGB 12.0  --   --   --   --  11.5*  --  11.5*  --   --   HCT 38.5  --   --   --   --  36.9  --  37.7  --   --   PLT 235  --   --   --   --  206  --  246  --   --   APTT  --   --   --  137*  --   --   --   --   --   --   LABPROT  --   --   --  14.0  --   --   --   --   --   --   INR  --   --   --  1.0  --   --   --   --   --   --   HEPARINUNFRC  --   --   --   --    < >  --    < > 0.23* 0.19* 0.72*  CREATININE 0.73  --   --   --   --  0.69  --  0.85  --   --   TROPONINIHS 12 57* 92*  --   --   --   --   --   --   --    < > = values in this interval not displayed.    Estimated Creatinine Clearance: 72.3 mL/min (by C-G formula based on SCr of 0.85 mg/dL).   Medical History: Past Medical History:  Diagnosis Date   Aortic valve regurgitation    CHF (congestive heart failure) (HCC)    COPD (chronic obstructive pulmonary disease) (HCC)    Coronary artery disease    Depression    Hepatitis C    Hyperparathyroidism    Hypertension    Mitral valve regurgitation    Neuropathy    to R arm only   Non-toxic multinodular goiter    Pulmonary hypertension (HCC) 2022    noted on CT   Pulmonary nodule    Thyroid  disease     Medications:  Scheduled:   albuterol   2.5 mg Nebulization TID   aspirin   81 mg Oral Daily   celecoxib   200 mg Oral BID   escitalopram   10 mg Oral Daily   fluticasone  furoate-vilanterol  1 puff Inhalation Daily   isosorbide-hydrALAZINE   1 tablet Oral TID   metoprolol  succinate  12.5 mg Oral Daily   predniSONE   40 mg Oral Q breakfast  pregabalin   150 mg Oral BID   rosuvastatin   20 mg Oral Daily   spironolactone   25 mg Oral Daily    Assessment:  63 y/o female presenting with shortness of breath. PMH significant for COPD, HFpEF, CAD, anxiety/depression, hepatitis C, hyperparathyroidism, HTN, peripheral neuropathy. In ED, found to have elevated troponin levels. Pharmacy has been consulted to initiate heparin  infusion. Per chart review, patient is not on anticoagulation prior to admission.  1021 0011 HL 0.43, therapeutic x 1 1021 0628 HL 0.4 1022 0426 HL 0.23, SUBtherapeutic  1022 1231 HL 0.19  1022 2005 HL 0.72, SUPRAtherapeutic   Goal of Therapy:  Heparin  level 0.3-0.7 units/ml Monitor platelets by anticoagulation protocol: Yes    Plan:  Heparin  level is supratherapeutic.  Decrease the heparin  infusion to 1150 units/hr.  Recheck heparin  level in 6 hours after rate change.  CBC daily while on heparin .   Thank you for involving pharmacy in this patient's care.   Abrham Maslowski A Josph [place], PharmD Clinical Pharmacist 07/24/2024 8:40 PM

## 2024-07-25 DIAGNOSIS — J441 Chronic obstructive pulmonary disease with (acute) exacerbation: Secondary | ICD-10-CM | POA: Diagnosis not present

## 2024-07-25 DIAGNOSIS — I5023 Acute on chronic systolic (congestive) heart failure: Secondary | ICD-10-CM | POA: Diagnosis not present

## 2024-07-25 DIAGNOSIS — I429 Cardiomyopathy, unspecified: Secondary | ICD-10-CM | POA: Diagnosis not present

## 2024-07-25 DIAGNOSIS — J9601 Acute respiratory failure with hypoxia: Secondary | ICD-10-CM | POA: Diagnosis not present

## 2024-07-25 LAB — CBC WITH DIFFERENTIAL/PLATELET
Abs Immature Granulocytes: 0.04 K/uL (ref 0.00–0.07)
Basophils Absolute: 0 K/uL (ref 0.0–0.1)
Basophils Relative: 0 %
Eosinophils Absolute: 0 K/uL (ref 0.0–0.5)
Eosinophils Relative: 0 %
HCT: 35.6 % — ABNORMAL LOW (ref 36.0–46.0)
Hemoglobin: 10.8 g/dL — ABNORMAL LOW (ref 12.0–15.0)
Immature Granulocytes: 0 %
Lymphocytes Relative: 11 %
Lymphs Abs: 1.2 K/uL (ref 0.7–4.0)
MCH: 27.7 pg (ref 26.0–34.0)
MCHC: 30.3 g/dL (ref 30.0–36.0)
MCV: 91.3 fL (ref 80.0–100.0)
Monocytes Absolute: 0.8 K/uL (ref 0.1–1.0)
Monocytes Relative: 8 %
Neutro Abs: 9 K/uL — ABNORMAL HIGH (ref 1.7–7.7)
Neutrophils Relative %: 81 %
Platelets: 229 K/uL (ref 150–400)
RBC: 3.9 MIL/uL (ref 3.87–5.11)
RDW: 14.3 % (ref 11.5–15.5)
WBC: 11.1 K/uL — ABNORMAL HIGH (ref 4.0–10.5)
nRBC: 0 % (ref 0.0–0.2)

## 2024-07-25 LAB — HEPARIN LEVEL (UNFRACTIONATED)
Heparin Unfractionated: 0.59 [IU]/mL (ref 0.30–0.70)
Heparin Unfractionated: 0.47 [IU]/mL (ref 0.30–0.70)

## 2024-07-25 LAB — COMPREHENSIVE METABOLIC PANEL WITH GFR
ALT: 8 U/L (ref 0–44)
AST: 15 U/L (ref 15–41)
Albumin: 3.2 g/dL — ABNORMAL LOW (ref 3.5–5.0)
Alkaline Phosphatase: 61 U/L (ref 38–126)
Anion gap: 9 (ref 5–15)
BUN: 28 mg/dL — ABNORMAL HIGH (ref 8–23)
CO2: 29 mmol/L (ref 22–32)
Calcium: 8.1 mg/dL — ABNORMAL LOW (ref 8.9–10.3)
Chloride: 107 mmol/L (ref 98–111)
Creatinine, Ser: 0.82 mg/dL (ref 0.44–1.00)
GFR, Estimated: >60 mL/min (ref >60)
Glucose, Bld: 116 mg/dL — ABNORMAL HIGH (ref 70–99)
Potassium: 4 mmol/L (ref 3.5–5.1)
Sodium: 145 mmol/L (ref 135–145)
Total Bilirubin: 0.4 mg/dL (ref 0.0–1.2)
Total Protein: 5.9 g/dL — ABNORMAL LOW (ref 6.5–8.1)

## 2024-07-25 LAB — PHOSPHORUS: Phosphorus: 3.5 mg/dL (ref 2.5–4.6)

## 2024-07-25 LAB — MAGNESIUM: Magnesium: 2.6 mg/dL — ABNORMAL HIGH (ref 1.7–2.4)

## 2024-07-25 MED ORDER — DOXYCYCLINE HYCLATE 100 MG PO TABS
100.0000 mg | ORAL_TABLET | Freq: Two times a day (BID) | ORAL | Status: AC
Start: 2024-07-25 — End: 2024-07-27
  Administered 2024-07-25 – 2024-07-26 (×3): 100 mg via ORAL
  Filled 2024-07-25 (×3): qty 1

## 2024-07-25 NOTE — Progress Notes (Signed)
 PROGRESS NOTE    Cindy Gutierrez  FMW:[id number] DOB: [birth year] DOA: 07/22/2024 [facility]CP[facility]  Chief Complaint  Patient presents with   Respiratory Distress    Hospital Course:  Cindy Gutierrez is a 63 year old female with COPD, heart failure with preserved EF, CAD, anxiety, depression, hepatitis C, hyperparathyroidism, hypertension, tobacco abuse, peripheral neuropathy, mitral valve regurg, pulmonary hypertension, who presented with worsening shortness of breath patient called EMS who found patient to be in severe respiratory distress requiring 6 L O2.  On arrival she was tachypneic, requiring BiPAP, CXR negative for acute infiltrates, lab work mostly unremarkable.  Patient was admitted for COPD exacerbation and started on Solu-Medrol . Echocardiogram was performed which revealed LVEF reduced to 40% as well as new regional wall motion abnormalities suspicious for ACS.  High-sensitivity troponin demonstrated an uptrend from 52-92.  Cardiology was consulted. Patient was started on heparin  drip and initiated on IV Lasix .   10/23: cardiac cath tomorrow  Subjective:  Remains dyspneic while sitting in the bed. Some concerns about cath  Objective: Vitals:   07/25/24 0459 07/25/24 0823 07/25/24 1658 07/25/24 2018  BP: (!) 150/58 (!) 161/60 (!) 153/48 (!) 143/56  Pulse: (!) 56 (!) 56 (!) 55 (!) 59  Resp: 20  17 20   Temp: 98.6 F (37 C) 98.1 F (36.7 C) 98.3 F (36.8 C) 98.3 F (36.8 C)  TempSrc:  Oral Oral   SpO2: 100% 100% 100% 98%  Weight:      Height:        Intake/Output Summary (Last 24 hours) at 07/25/2024 2109 Last data filed at 07/25/2024 0900 Gross per 24 hour  Intake 480 ml  Output --  Net 480 ml   Filed Weights   07/22/24 0524  Weight: 77.1 kg    Examination:  Physical Exam  Constitutional: In no distress.  Cardiovascular: Normal rate, regular rhythm. No lower extremity edema  Pulmonary: Non labored breathing on [place], diffuse wheezing.  Abdominal:  Soft. Non distended and non tender Musculoskeletal: Normal range of motion.     Neurological: Alert and oriented to person, place, and time. Non focal  Skin: Skin is warm and dry.   Assessment & Plan:  Principal Problem:   COPD (chronic obstructive pulmonary disease) (HCC) Active Problems:   Acute hypoxic respiratory failure (HCC)   COPD with acute exacerbation (HCC)   Acute hypoxic respiratory failure COPD with acute exacerbation --Pt is now weaned off to 2 liter O2.  --Legionella pending, mycoplasma negative  -- Will consider CTA chest if continued desaturation --Discussed with patient that she may need to bring her long acting inhaler from home given she does not like formulary equivalent of her home inhaler (she states it is a powder mist) - Change Solu-Medrol >Prednisone  - Continue scheduled inhalers and as needed DuoNebs - Encourage incentive parameter and flutter valve - On doxycycline  d3, will continue for 5 day course   Acute heart failure with reduced EF - New diagnosis for this patient - On IV diuresis per cardiology.  Follow strict I's and O's - Cath planned for tomorrow - TTE LVEF 40% with regional wall motion abnormalities, grade 1 diastolic dysfunction - Has been initiated on GDMT, somewhat limited by extensive allergy profile  CAD - Known history of CAD.  Left heart cath at [facility] 2011 - Troponin 12->57->92 this admission.  EKG without ischemic changes - New regional wall motion abnormalities, cardiology consulted - There is concern of stress-induced cardiomyopathy - Cardiology planning for left heart cath tomorrow  Hypertensive urgency HTN - Blood pressure very uncontrolled on admission with SBP over 200 - Patient has documented intolerance and allergies to multiple different blood pressure medications including ACE, ARB, Coreg.  She reports she recently discontinued amlodipine  due to side effects - Blood pressure better controlled now - Will continue to  increase and titrate medications as patient can tolerate --PRN hydralazine  and labetolol  History of tobacco abuse - Quit smoking 30 years ago.  Anxiety Depression - Continue home meds - May require intermittent Ativan  for breakthrough anxiety.  Peripheral neuropathy - Continue home meds  Mitral valve regurg Pulmonary hypertension - Continue home meds.  Cardiology consulted as above.  Hepatitis C Hyperparathyroidism -- Cont home meds   DVT prophylaxis: Heparin  drip per ACS protocol   Code Status: Full Code Disposition:  Pending clinical improvement. Cardiology planning for [facility] tomorrow  Consultants:  Cardio  Procedures:  Cath on 10/24  Antimicrobials:  Anti-infectives (From admission, onward)    Start     Dose/Rate Route Frequency Ordered Stop   07/25/24 2200  doxycycline  (VIBRA -TABS) tablet 100 mg        100 mg Oral Every 12 hours 07/25/24 1233 07/27/24 0959   07/22/24 0900  doxycycline  (VIBRAMYCIN ) 100 mg in sodium chloride  0.9 % 250 mL IVPB  Status:  Discontinued        100 mg 125 mL/hr over 120 Minutes Intravenous Every 12 hours 07/22/24 0801 07/25/24 1233       Data Reviewed: I have personally reviewed following labs and imaging studies CBC: Recent Labs  Lab 07/22/24 0538 07/23/24 0532 07/24/24 0426 07/25/24 0242  WBC 9.4 8.1 12.1* 11.1*  NEUTROABS 6.2  --  10.8* 9.0*  HGB 12.0 11.5* 11.5* 10.8*  HCT 38.5 36.9 37.7 35.6*  MCV 90.0 87.9 90.4 91.3  PLT 235 206 246 229   Basic Metabolic Panel: Recent Labs  Lab 07/22/24 0538 07/23/24 0532 07/24/24 0426 07/25/24 0242  NA 146* 144 146* 145  K 3.5 3.9 4.1 4.0  CL 111 106 103 107  CO2 26 22 31 29   GLUCOSE 133* 174* 137* 116*  BUN 15 16 24* 28*  CREATININE 0.73 0.69 0.85 0.82  CALCIUM  8.4* 8.6* 8.3* 8.1*  MG  --   --  1.8 2.6*  PHOS  --   --  4.2 3.5   GFR: Estimated Creatinine Clearance: 75 mL/min (by C-G formula based on SCr of 0.82 mg/dL). Liver Function Tests: Recent Labs  Lab  07/22/24 0538 07/24/24 0426 07/25/24 0242  AST 16 20 15   ALT 10 10 8   ALKPHOS 80 65 61  BILITOT 0.4 0.3 0.4  PROT 6.5 6.7 5.9*  ALBUMIN 3.7 3.7 3.2*   CBG: No results for input(s): GLUCAP in the last 168 hours.  Recent Results (from the past 240 hours)  Resp panel by RT-PCR (RSV, Flu A&B, Covid) Anterior Nasal Swab     Status: None   Collection Time: 07/22/24  5:38 AM   Specimen: Anterior Nasal Swab  Result Value Ref Range Status   SARS Coronavirus 2 by RT PCR NEGATIVE NEGATIVE Final    Comment: (NOTE) SARS-CoV-2 target nucleic acids are NOT DETECTED.  The SARS-CoV-2 RNA is generally detectable in upper respiratory specimens during the acute phase of infection. The lowest concentration of SARS-CoV-2 viral copies this assay can detect is 138 copies/mL. A negative result does not preclude SARS-Cov-2 infection and should not be used as the sole basis for treatment or other patient management decisions. A negative result  may occur with  improper specimen collection/handling, submission of specimen other than nasopharyngeal swab, presence of viral mutation(s) within the areas targeted by this assay, and inadequate number of viral copies(<138 copies/mL). A negative result must be combined with clinical observations, patient history, and epidemiological information. The expected result is Negative.  Fact Sheet for Patients:  BloggerCourse.com  Fact Sheet for Healthcare Providers:  SeriousBroker.it  This test is no t yet approved or cleared by the [place]  FDA and  has been authorized for detection and/or diagnosis of SARS-CoV-2 by FDA under an Emergency Use Authorization (EUA). This EUA will remain  in effect (meaning this test can be used) for the duration of the COVID-19 declaration under Section 564(b)(1) of the Act, 21 U.S.C.section 360bbb-3(b)(1), unless the authorization is terminated  or revoked sooner.        Influenza A by PCR NEGATIVE NEGATIVE Final   Influenza B by PCR NEGATIVE NEGATIVE Final    Comment: (NOTE) The Xpert Xpress SARS-CoV-2/FLU/RSV plus assay is intended as an aid in the diagnosis of influenza from Nasopharyngeal swab specimens and should not be used as a sole basis for treatment. Nasal washings and aspirates are unacceptable for Xpert Xpress SARS-CoV-2/FLU/RSV testing.  Fact Sheet for Patients: BloggerCourse.com  Fact Sheet for Healthcare Providers: SeriousBroker.it  This test is not yet approved or cleared by the [place]  FDA and has been authorized for detection and/or diagnosis of SARS-CoV-2 by FDA under an Emergency Use Authorization (EUA). This EUA will remain in effect (meaning this test can be used) for the duration of the COVID-19 declaration under Section 564(b)(1) of the Act, 21 U.S.C. section 360bbb-3(b)(1), unless the authorization is terminated or revoked.     Resp Syncytial Virus by PCR NEGATIVE NEGATIVE Final    Comment: (NOTE) Fact Sheet for Patients: BloggerCourse.com  Fact Sheet for Healthcare Providers: SeriousBroker.it  This test is not yet approved or cleared by the [place]  FDA and has been authorized for detection and/or diagnosis of SARS-CoV-2 by FDA under an Emergency Use Authorization (EUA). This EUA will remain in effect (meaning this test can be used) for the duration of the COVID-19 declaration under Section 564(b)(1) of the Act, 21 U.S.C. section 360bbb-3(b)(1), unless the authorization is terminated or revoked.  Performed at [facility], [address] [place], [place] [address]   Blood culture (routine x 2)     Status: None (Preliminary result)   Collection Time: 07/22/24  5:38 AM   Specimen: BLOOD  Result Value Ref Range Status   Specimen Description BLOOD BLOOD RIGHT WRIST  Final   Special Requests    Final    BOTTLES DRAWN AEROBIC AND ANAEROBIC Blood Culture adequate volume   Culture   Final    NO GROWTH 3 DAYS Performed at [facility], [address] [place], [place] [address]    Report Status PENDING  Incomplete  Blood culture (routine x 2)     Status: None (Preliminary result)   Collection Time: 07/22/24  5:38 AM   Specimen: BLOOD  Result Value Ref Range Status   Specimen Description BLOOD BLOOD LEFT FOREARM  Final   Special Requests   Final    BOTTLES DRAWN AEROBIC AND ANAEROBIC Blood Culture results may not be optimal due to an inadequate volume of blood received in culture bottles   Culture   Final    NO GROWTH 3 DAYS Performed at [facility], [address] [place], [place] [address]    Report Status  PENDING  Incomplete     Radiology Studies: No results found.   Scheduled Meds:  albuterol   2.5 mg Nebulization TID   aspirin   81 mg Oral Daily   celecoxib   200 mg Oral BID   doxycycline   100 mg Oral Q12H   escitalopram   10 mg Oral Daily   fluticasone  furoate-vilanterol  1 puff Inhalation Daily   isosorbide-hydrALAZINE   1 tablet Oral TID   metoprolol  succinate  12.5 mg Oral Daily   predniSONE   40 mg Oral Q breakfast   pregabalin   150 mg Oral BID   rosuvastatin   20 mg Oral Daily   spironolactone   25 mg Oral Daily   Continuous Infusions:  heparin  1,150 Units/hr (07/25/24 1634)     LOS: 3 days  MDM: Patient is high risk for one or more organ failure.  They necessitate ongoing hospitalization for continued IV therapies and subsequent lab monitoring. Total time spent interpreting labs and vitals, reviewing the medical record, coordinating care amongst consultants and care team members, directly assessing and discussing care with the patient and/or family: 35 min  Shandiin Eisenbeis Maree, MD [facility]  To contact the attending physician between 7A-7P please use Epic Chat. To contact the covering physician during after hours 7P-7A, please review  Amion.  07/25/2024, 9:09 PM   *This document has been created with the assistance of dictation software. Please excuse typographical errors. *

## 2024-07-25 NOTE — Evaluation (Signed)
 Occupational Therapy Evaluation Patient Details Name: Cindy Gutierrez MRN: [id number] DOB: 1960/10/05 Today's Date: 07/25/2024   History of Present Illness   Cindy Gutierrez is a 63 year old female with COPD, heart failure with preserved EF, CAD, anxiety, depression, hepatitis C, hyperparathyroidism, hypertension, tobacco abuse, peripheral neuropathy, mitral valve regurg, pulmonary hypertension, who presented with worsening shortness of breath patient called EMS who found patient to be in severe respiratory distress requiring 6 L O2.  On arrival she was tachypneic, requiring BiPAP, CXR negative for acute infiltrates, lab work mostly unremarkable.  Patient was admitted for COPD exacerbation.     Clinical Impressions  Patient reports living at home with son's in an apartment and being Ind at baseline without use of AD. Pt endorses being Ind with self care tasks and children assist with IADLs. Pt has flight of stairs to get to second floor apartment and she reports often feeling gassed when she gets there and believes there is mold in her apartment affecting her breathing. Pt is on 2Ls O2 via [place] at baseline and 3-4Ls when feeling SOB. Pt performs bed mobility without assistance and requesting to go to bathroom for BM. She pushes IV pole and does not need assistance with toilet transfer or mobility this session. OT does discuss energy conservation strategies related to self care with pt verbalizing understanding.  Patient requesting to remain on commode and reports feeling like she has no further needs at this time. Pt is at baseline. OT to complete order.       If plan is discharge home, recommend the following:   Assist for transportation;Help with stairs or ramp for entrance     Functional Status Assessment   Patient has not had a recent decline in their functional status     Equipment Recommendations   None recommended by OT      Precautions/Restrictions    Precautions Precautions: Fall Recall of Precautions/Restrictions: Intact     Mobility Bed Mobility Overal bed mobility: Independent                  Transfers Overall transfer level: Needs assistance Equipment used: None Transfers: Sit to/from Stand Sit to Stand: Supervision                  Balance Overall balance assessment: Modified Independent                                         ADL either performed or assessed with clinical judgement   ADL Overall ADL's : At baseline                                             Vision Patient Visual Report: No change from baseline              Pertinent Vitals/Pain Pain Assessment Pain Assessment: No/denies pain     Extremity/Trunk Assessment Upper Extremity Assessment Upper Extremity Assessment: Overall WFL for tasks assessed   Lower Extremity Assessment Lower Extremity Assessment: Overall WFL for tasks assessed       Communication Communication Communication: No apparent difficulties   Cognition Arousal: Alert Behavior During Therapy: WFL for tasks assessed/performed Cognition: No apparent impairments  Following commands: Intact       Cueing  General Comments   Cueing Techniques: Verbal cues  Pt on 2Ls at baseline but reports using 3-4 Ls when she feels like she is more fatigued or SOB           Home Living Family/patient expects to be discharged to:: Private residence Living Arrangements: Children (son) Available Help at Discharge: Family;Available 24 hours/day Type of Home: Apartment Home Access: Stairs to enter [employer] of Steps: flight Entrance Stairs-Rails: Left;Right Home Layout: One level     Bathroom Shower/Tub: Chief Strategy Officer: Standard Bathroom Accessibility: No   Home Equipment: None   Additional Comments: no use of AD      Prior  Functioning/Environment Prior Level of Function : Independent/Modified Independent             Mobility Comments: Independent without AD. ADLs Comments: Independent however reports having increased difficulty with bathing/grooming d/t limited UE ROM.            OT Goals(Current goals can be found in the care plan section)   Acute Rehab OT Goals Patient Stated Goal: to go home OT Goal Formulation: With patient Time For Goal Achievement: 07/25/24 Potential to Achieve Goals: Fair   AM-PAC OT 6 Clicks Daily Activity     Outcome Measure Help from another person eating meals?: None Help from another person taking care of personal grooming?: None Help from another person toileting, which includes using toliet, bedpan, or urinal?: None Help from another person bathing (including washing, rinsing, drying)?: None Help from another person to put on and taking off regular upper body clothing?: None Help from another person to put on and taking off regular lower body clothing?: None 6 Click Score: 24   End of Session Equipment Utilized During Treatment: Oxygen  Activity Tolerance: Patient tolerated treatment well Patient left: Other (comment) (commode)                   Time: 8569-8552 OT Time Calculation (min): 17 min Charges:  OT General Charges $OT Visit: 1 Visit OT Evaluation $OT Eval Low Complexity: 1 Low OT Treatments $Self Care/Home Management : 8-22 mins  Izetta Claude, MS, OTR/L , CBIS ascom [phone]  07/25/24, 3:13 PM

## 2024-07-25 NOTE — Evaluation (Signed)
 Physical Therapy Evaluation Patient Details Name: Cindy Gutierrez MRN: [id number] DOB: [birth year] Today's Date: 07/25/2024  History of Present Illness  Cindy Gutierrez is a 63 year old female with COPD, heart failure with preserved EF, CAD, anxiety, depression, hepatitis C, hyperparathyroidism, hypertension, tobacco abuse, peripheral neuropathy, mitral valve regurg, pulmonary hypertension, who presented with worsening shortness of breath patient called EMS who found patient to be in severe respiratory distress requiring 6 L O2.  On arrival she was tachypneic, requiring BiPAP, CXR negative for acute infiltrates, lab work mostly unremarkable.  Patient was admitted for COPD exacerbation.  Clinical Impression  Pt is pleasant 63 y.o. female admitted for COPD exacerbation. Prior to hospitalization, pt independent with ADLs and amb without use of AD. Pt on 2L of O2 at baseline. Pt able to amb 234ft while on 2L of O2; SpO2 at 90% at the lowest and HR 90s at the highest. Pt demonstrates all bed mobility/transfers/ambulation at baseline level. Pt does not require any further PT needs at this time. Pt will be dc in house and does not require follow up. RN aware. Will dc current orders.         If plan is discharge home, recommend the following: Help with stairs or ramp for entrance   Can travel by private vehicle        Equipment Recommendations None recommended by PT  Recommendations for Other Services       Functional Status Assessment Patient has not had a recent decline in their functional status     Precautions / Restrictions Precautions Precautions: Fall Recall of Precautions/Restrictions: Intact Restrictions Weight Bearing Restrictions Per Provider Order: No      Mobility  Bed Mobility Overal bed mobility: Independent             General bed mobility comments: supine<>sit without physical assistance. HOB elevated. No use of bed rails.    Transfers Overall transfer level: Needs  assistance Equipment used: None Transfers: Sit to/from Stand Sit to Stand: Supervision           General transfer comment: SBA for safety. Able to stand from lowest bed height.    Ambulation/Gait Ambulation/Gait assistance: Supervision Gait Distance (Feet): 200 Feet Assistive device: None Gait Pattern/deviations: WFL(Within Functional Limits)       General Gait Details: Pt motivated to make it the full circle around nurses' station. 2-3 standing rest breaks and intermittent use of hand rails in hall when resting. 1 mild LOB 2/2 narrow BOS however pt able to self correct.  Stairs            Wheelchair Mobility     Tilt Bed    Modified Rankin (Stroke Patients Only)       Balance Overall balance assessment: Mild deficits observed, not formally tested                                           Pertinent Vitals/Pain Pain Assessment Pain Assessment: No/denies pain    Home Living Family/patient expects to be discharged to:: Private residence Living Arrangements: Children (adult son) Available Help at Discharge: Family;Available 24 hours/day Type of Home: Apartment Home Access: Stairs to enter Entrance Stairs-Rails: [employer] of Steps: flight   Home Layout: One level Home Equipment: None      Prior Function Prior Level of Function : Independent/Modified Independent  Mobility Comments: Independent without AD. ADLs Comments: Independent however reports having increased difficulty with bathing/grooming d/t limited UE ROM.     Extremity/Trunk Assessment   Upper Extremity Assessment Upper Extremity Assessment: Overall WFL for tasks assessed    Lower Extremity Assessment Lower Extremity Assessment: Overall WFL for tasks assessed    Cervical / Trunk Assessment Cervical / Trunk Assessment: Normal  Communication   Communication Communication: No apparent difficulties    Cognition Arousal:  Alert Behavior During Therapy: WFL for tasks assessed/performed   PT - Cognitive impairments: No apparent impairments                       PT - Cognition Comments: pleasant and agreeable Following commands: Intact       Cueing Cueing Techniques: Verbal cues     General Comments General comments (skin integrity, edema, etc.): Pt on 4L of O2 upon entry into room. Able to wean to 2 L, which is pt's baseline. SpO2 at 90% at the lowest while amb. HR in the 90s at the highest.    Exercises     Assessment/Plan    PT Assessment Patient does not need any further PT services  PT Problem List         PT Treatment Interventions      PT Goals (Current goals can be found in the Care Plan section)  Acute Rehab PT Goals Patient Stated Goal: to be able to breathe better. PT Goal Formulation: With patient Time For Goal Achievement: 08/08/24 Potential to Achieve Goals: Good    Frequency       Co-evaluation               AM-PAC PT 6 Clicks Mobility  Outcome Measure Help needed turning from your back to your side while in a flat bed without using bedrails?: None Help needed moving from lying on your back to sitting on the side of a flat bed without using bedrails?: None Help needed moving to and from a bed to a chair (including a wheelchair)?: None Help needed standing up from a chair using your arms (e.g., wheelchair or bedside chair)?: None Help needed to walk in hospital room?: A Little Help needed climbing 3-5 steps with a railing? : A Little 6 Click Score: 22    End of Session Equipment Utilized During Treatment: Gait belt;Oxygen Activity Tolerance: Patient tolerated treatment well Patient left: in bed;with call bell/phone within reach Nurse Communication: Mobility status PT Visit Diagnosis: Difficulty in walking, not elsewhere classified (R26.2)    Time: 9150-9093 PT Time Calculation (min) (ACUTE ONLY): 17 min   Charges:                 Marchella Hibbard, SPT   Patricio Popwell 07/25/2024, 10:02 AM

## 2024-07-25 NOTE — Plan of Care (Signed)

## 2024-07-25 NOTE — Progress Notes (Signed)
 [facility] CLINIC CARDIOLOGY PROGRESS NOTE       Patient ID: Cindy Gutierrez MRN: [id number] DOB/AGE: June 27, 1961 63 y.o.  Admit date: 07/22/2024 Referring Physician Dr. Cort Mana Primary Physician Administration, [facility] Cardiologist Dr. Florencio Reason for Consultation abnormal echo  HPI: Cindy Gutierrez is a 63 y.o. female  with a past medical history of chronic HFpEF, coronary artery disease, mitral regurgitation, pulm hypertension, COPD, hypertension, tobacco use, depression, hepatitis C who presented to the ED on 07/22/2024 for cough, wheezing, shortness of breath.  Echo done 07/22/2024 revealed reduced EF and WMA's.  Cardiology was consulted for further evaluation.   Interval history: - Patient seen and examined this morning, sitting upright in hospital bed. - SOB gradually improving.  Weaned to 2L and less dyspneic with conversation. - Endorses good urine output, renal function stable today.  Review of systems complete and found to be negative unless listed above    Past Medical History:  Diagnosis Date   Aortic valve regurgitation    CHF (congestive heart failure) (HCC)    COPD (chronic obstructive pulmonary disease) (HCC)    Coronary artery disease    Depression    Hepatitis C    Hyperparathyroidism    Hypertension    Mitral valve regurgitation    Neuropathy    to R arm only   Non-toxic multinodular goiter    Pulmonary hypertension (HCC) 2022   noted on CT   Pulmonary nodule    Thyroid  disease     Past Surgical History:  Procedure Laterality Date   COLONOSCOPY WITH PROPOFOL  N/A 08/21/2018   Procedure: COLONOSCOPY WITH PROPOFOL ;  Surgeon: Unk Corinn Skiff, MD;  Location: [facility] ENDOSCOPY;  Service: Gastroenterology;  Laterality: N/A;   PARATHYROIDECTOMY Left    TUBAL LIGATION      Medications Prior to Admission  Medication Sig Dispense Refill Last Dose/Taking   acetaminophen  (TYLENOL ) 325 MG tablet Take 650 mg by mouth 3 (three) times daily as needed  for mild pain (pain score 1-3).   Unknown   albuterol  (PROVENTIL ) (2.5 MG/3ML) 0.083% nebulizer solution Take 2.5 mg by nebulization every 4 (four) hours as needed for wheezing or shortness of breath.   Unknown   albuterol  (VENTOLIN  HFA) 108 (90 Base) MCG/ACT inhaler Inhale 2 puffs into the lungs every 6 (six) hours as needed for wheezing or shortness of breath. 8 g 2 Unknown   aspirin  81 MG chewable tablet Chew 81 mg by mouth daily.   Past Week   budesonide -formoterol  (SYMBICORT ) 160-4.5 MCG/ACT inhaler Inhale 2 puffs into the lungs 2 (two) times daily.   07/21/2024   calcium  carbonate (TUMS - DOSED IN MG ELEMENTAL CALCIUM ) 500 MG chewable tablet Chew 1 tablet by mouth daily. As needed   Unknown   celecoxib  (CELEBREX ) 200 MG capsule Take 200 mg by mouth 2 (two) times daily.   07/21/2024   escitalopram  (LEXAPRO ) 10 MG tablet Take 10 mg by mouth daily.   Unknown   furosemide  (LASIX ) 20 MG tablet Take 1 tablet (20 mg total) by mouth daily. 30 tablet 0 07/20/2024   pregabalin  (LYRICA ) 150 MG capsule Take 150 mg by mouth 2 (two) times daily.   07/21/2024   rosuvastatin  (CRESTOR ) 20 MG tablet Take 20 mg by mouth daily.   Past Week   spironolactone  (ALDACTONE ) 25 MG tablet Take 1 tablet (25 mg total) by mouth daily. 30 tablet 0 Past Week   amLODipine  (NORVASC ) 10 MG tablet Take 1 tablet (10 mg total) by mouth daily. (  Patient not taking: Reported on 07/22/2024) 30 tablet 1 Not Taking   dapagliflozin  propanediol (FARXIGA ) 10 MG TABS tablet Take 1 tablet (10 mg total) by mouth daily. (Patient not taking: Reported on 07/22/2024) 30 tablet 0 Not Taking   predniSONE  (DELTASONE ) 10 MG tablet 4 tabs po daily for two days (Patient not taking: Reported on 07/22/2024) 8 tablet 0 Not Taking   Social History   Socioeconomic History   Marital status: Single    Spouse name: Not on file   Number of children: Not on file   Years of education: Not on file   Highest education level: Not on file  Occupational History    Not on file  Tobacco Use   Smoking status: Former    Types: Cigarettes    Start date: 2023    Quit date: 12/04/1993    Years since quitting: 30.6   Smokeless tobacco: Never  Vaping Use   Vaping status: Never Used  Substance and Sexual Activity   Alcohol use: Never   Drug use: Never   Sexual activity: Not Currently  Other Topics Concern   Not on file  Social History Narrative   ** Merged History Encounter **       Social Drivers of Health   Financial Resource Strain: High Risk (04/25/2024)   Received from [facility]   Overall Financial Resource Strain (CARDIA)    Difficulty of Paying Living Expenses: Hard  Food Insecurity: No Food Insecurity (07/22/2024)   Hunger Vital Sign    Worried About Running Out of Food in the Last Year: Never true    Ran Out of Food in the Last Year: Never true  Transportation Needs: No Transportation Needs (07/22/2024)   PRAPARE - Administrator, Civil Service (Medical): No    Lack of Transportation (Non-Medical): No  Physical Activity: Not on file  Stress: Not on file  Social Connections: Moderately Isolated (07/22/2024)   Social Connection and Isolation Panel    Frequency of Communication with Friends and Family: More than three times a week    Frequency of Social Gatherings with Friends and Family: More than three times a week    Attends Religious Services: More than 4 times per year    Active Member of Clubs or Organizations: No    Attends Banker Meetings: Never    Marital Status: Never married  Intimate Partner Violence: Not At Risk (07/22/2024)   Humiliation, Afraid, Rape, and Kick questionnaire    Fear of Current or Ex-Partner: No    Emotionally Abused: No    Physically Abused: No    Sexually Abused: No    Family History  Problem Relation Age of Onset   Alzheimer's disease Mother    Heart disease Father      Vitals:   07/24/24 2156 07/24/24 2327 07/25/24 0459 07/25/24 0823  BP:   131/61 (!) 150/58 (!) 161/60  Pulse:  65 (!) 56 (!) 56  Resp: 17 20 20    Temp:  98.2 F (36.8 C) 98.6 F (37 C) 98.1 F (36.7 C)  TempSrc:    Oral  SpO2: 100% 100% 100% 100%  Weight:      Height:        PHYSICAL EXAM General: Chronically ill appearing female, well nourished, in no acute distress. HEENT: Normocephalic and atraumatic. Neck: No JVD.  Lungs: Increased respiratory effort on 2L [place]. Heart: HRRR. Normal S1 and S2 without gallops or murmurs.  Abdomen: Non-distended appearing.  Msk: Normal strength and tone for age. Extremities: Warm and well perfused. No clubbing, cyanosis. Trace edema.  Neuro: Alert and oriented X 3. Psych: Answers questions appropriately.   Labs: Basic Metabolic Panel: Recent Labs    07/24/24 0426 07/25/24 0242  NA 146* 145  K 4.1 4.0  CL 103 107  CO2 31 29  GLUCOSE 137* 116*  BUN 24* 28*  CREATININE 0.85 0.82  CALCIUM  8.3* 8.1*  MG 1.8 2.6*  PHOS 4.2 3.5   Liver Function Tests: Recent Labs    07/24/24 0426 07/25/24 0242  AST 20 15  ALT 10 8  ALKPHOS 65 61  BILITOT 0.3 0.4  PROT 6.7 5.9*  ALBUMIN 3.7 3.2*   No results for input(s): LIPASE, AMYLASE in the last 72 hours. CBC: Recent Labs    07/24/24 0426 07/25/24 0242  WBC 12.1* 11.1*  NEUTROABS 10.8* 9.0*  HGB 11.5* 10.8*  HCT 37.7 35.6*  MCV 90.4 91.3  PLT 246 229   Cardiac Enzymes: No results for input(s): CKTOTAL, CKMB, CKMBINDEX, TROPONINIHS in the last 72 hours.  BNP: No results for input(s): BNP in the last 72 hours.  D-Dimer: No results for input(s): DDIMER in the last 72 hours. Hemoglobin A1C: No results for input(s): HGBA1C in the last 72 hours. Fasting Lipid Panel: No results for input(s): CHOL, HDL, LDLCALC, TRIG, CHOLHDL, LDLDIRECT in the last 72 hours. Thyroid  Function Tests: No results for input(s): TSH, T4TOTAL, T3FREE, THYROIDAB in the last 72 hours.  Invalid input(s): FREET3 Anemia Panel: No results  for input(s): VITAMINB12, FOLATE, FERRITIN, TIBC, IRON , RETICCTPCT in the last 72 hours.   Radiology: DG Chest 1 View Result Date: 07/23/2024 EXAM: 1 VIEW(S) XRAY OF THE CHEST 07/23/2024 05:04:26 AM COMPARISON: 07/22/2024 CLINICAL HISTORY: CHF (congestive heart failure) (HCC) 02706. CHF. FINDINGS: LUNGS AND PLEURA: Chronic hyperinflation. Bilateral chronic coarsened interstitial markings. No focal pulmonary opacity. No pulmonary edema. No pleural effusion. No pneumothorax. HEART AND MEDIASTINUM: Surgical clips at thoracic inlet. No acute abnormality of the cardiac and mediastinal silhouettes. BONES AND SOFT TISSUES: Old left rib fractures. No acute osseous abnormality. IMPRESSION: 1. No acute findings. 2. Chronic hyperinflation. 3. Bilateral chronic coarsened interstitial markings. Electronically signed by: Donnice Mania MD 07/23/2024 08:51 AM EDT RP Workstation: [id number]   ECHOCARDIOGRAM COMPLETE Result Date: 07/22/2024    ECHOCARDIOGRAM REPORT   Patient Name:   Cindy Gutierrez [facility]  Date of Exam: 07/22/2024 Medical Rec #:  [id number]     Height:       70.0 in Accession #:    [id number]    Weight:       170.0 lb Date of Birth:  September 01, 1961     BSA:          1.948 m Patient Age:    63 years      BP:           139/83 mmHg Patient Gender: F             HR:           80 bpm. Exam Location:  [facility] Procedure: 2D Echo, Cardiac Doppler and Color Doppler (Both Spectral and Color            Flow Doppler were utilized during procedure). Indications:     Elevated troponin  History:         Patient has prior history of Echocardiogram examinations, most                  recent 10/17/2023. CHF,  COPD and Pulmonary HTN; Risk                  Factors:Hypertension.  Sonographer:     Christopher Furnace Referring Phys:  [id number] CORT ONEIDA MANA Diagnosing Phys: Keller Alluri IMPRESSIONS  1. Left ventricular ejection fraction, by estimation, is 40%. The left ventricle has moderately decreased function. The left ventricle demonstrates  regional wall motion abnormalities (see scoring diagram/findings for description). There is moderate left  ventricular hypertrophy. Left ventricular diastolic parameters are consistent with Grade I diastolic dysfunction (impaired relaxation).  2. Right ventricular systolic function is normal. The right ventricular size is normal.  3. The mitral valve is normal in structure. Mild mitral valve regurgitation.  4. The aortic valve was not well visualized. Aortic valve regurgitation is mild.  5. The inferior vena cava is normal in size with greater than 50% respiratory variability, suggesting right atrial pressure of 3 mmHg. FINDINGS  Left Ventricle: Left ventricular ejection fraction, by estimation, is 40%. The left ventricle has moderately decreased function. The left ventricle demonstrates regional wall motion abnormalities. The left ventricular internal cavity size was normal in size. There is moderate left ventricular hypertrophy. Left ventricular diastolic parameters are consistent with Grade I diastolic dysfunction (impaired relaxation).  LV Wall Scoring: The mid anteroseptal segment, mid inferolateral segment, mid anterolateral segment, mid inferoseptal segment, mid anterior segment, and mid inferior segment are hypokinetic. The entire apex, basal anteroseptal segment, basal inferolateral segment, basal anterolateral segment, basal anterior segment, basal inferior segment, and basal inferoseptal segment are normal. Right Ventricle: The right ventricular size is normal. No increase in right ventricular wall thickness. Right ventricular systolic function is normal. Left Atrium: Left atrial size was normal in size. Right Atrium: Right atrial size was normal in size. Pericardium: There is no evidence of pericardial effusion. Mitral Valve: The mitral valve is normal in structure. Mild mitral valve regurgitation. MV peak gradient, 10.0 mmHg. The mean mitral valve gradient is 4.0 mmHg. Tricuspid Valve: The tricuspid  valve is normal in structure. Tricuspid valve regurgitation is mild. Aortic Valve: The aortic valve was not well visualized. Aortic valve regurgitation is mild. Aortic regurgitation PHT measures 445 msec. Aortic valve mean gradient measures 4.3 mmHg. Aortic valve peak gradient measures 8.1 mmHg. Aortic valve area, by VTI measures 3.09 cm. Pulmonic Valve: The pulmonic valve was not well visualized. Pulmonic valve regurgitation is not visualized. Aorta: The aortic root is normal in size and structure. Venous: The inferior vena cava is normal in size with greater than 50% respiratory variability, suggesting right atrial pressure of 3 mmHg. IAS/Shunts: The atrial septum is grossly normal.  LEFT VENTRICLE PLAX 2D LVIDd:         4.70 cm   Diastology LVIDs:         3.10 cm   LV e' medial:    8.38 cm/s LV PW:         1.40 cm   LV E/e' medial:  9.9 LV IVS:        0.90 cm   LV e' lateral:   9.79 cm/s LVOT diam:     2.30 cm   LV E/e' lateral: 8.5 LV SV:         85 LV SV Index:   44 LVOT Area:     4.15 cm  RIGHT VENTRICLE RV Basal diam:  4.90 cm RV Mid diam:    3.80 cm RV S prime:     11.90 cm/s TAPSE (M-mode): 3.3 cm LEFT ATRIUM  Index        RIGHT ATRIUM           Index LA diam:        2.20 cm 1.13 cm/m   RA Area:     14.10 cm LA Vol (A2C):   45.7 ml 23.46 ml/m  RA Volume:   33.20 ml  17.04 ml/m LA Vol (A4C):   35.6 ml 18.28 ml/m LA Biplane Vol: 41.3 ml 21.20 ml/m  AORTIC VALVE AV Area (Vmax):    3.09 cm AV Area (Vmean):   2.97 cm AV Area (VTI):     3.09 cm AV Vmax:           142.00 cm/s AV Vmean:          98.833 cm/s AV VTI:            0.274 m AV Peak Grad:      8.1 mmHg AV Mean Grad:      4.3 mmHg LVOT Vmax:         105.50 cm/s LVOT Vmean:        70.750 cm/s LVOT VTI:          0.204 m LVOT/AV VTI ratio: 0.74 AI PHT:            445 msec  AORTA Ao Root diam: 2.80 cm MITRAL VALVE                TRICUSPID VALVE MV Area (PHT): 3.89 cm     TR Peak grad:   23.2 mmHg MV Area VTI:   3.22 cm     TR Vmax:         241.00 cm/s MV Peak grad:  10.0 mmHg MV Mean grad:  4.0 mmHg     SHUNTS MV Vmax:       1.58 m/s     Systemic VTI:  0.20 m MV Vmean:      86.7 cm/s    Systemic Diam: 2.30 cm MV Decel Time: 195 msec MV E velocity: 82.80 cm/s MV A velocity: 119.00 cm/s MV E/A ratio:  0.70 Keller Paterson Electronically signed by Keller Paterson Signature Date/Time: 07/22/2024/12:26:42 PM    Final    DG Chest Portable 1 View Result Date: 07/22/2024 EXAM: 1 VIEW(S) XRAY OF THE CHEST 07/22/2024 05:52:00 AM COMPARISON: 04/14/2024 CLINICAL HISTORY: evalutae for pleural edema. Pt to ED via [facility] with c/o resp. Distress. Per EMS pt has hx of COPD, received 2 duonebs and 1.5in nitroglycerin  paste en route, however paste removed due to pt's BP dropping. Per EMS pt refused CPAP and IV access en route. ; Reason for exam: evalutae for pleural edema FINDINGS: LUNGS AND PLEURA: Hyperinflated lungs. Chronic interstitial coarsening. No focal pulmonary opacity. No pulmonary edema. No pleural effusion. No pneumothorax. HEART AND MEDIASTINUM: Surgical clips at thoracic inlet. No acute abnormality of the cardiac and mediastinal silhouettes. BONES AND SOFT TISSUES: Remote left rib fractures. No acute osseous abnormality. IMPRESSION: 1. No acute findings. 2. Hyperinflated lungs and chronic interstitial coarsening, consistent with history of COPD. Electronically signed by: Evalene Coho MD 07/22/2024 06:25 AM EDT RP Workstation: [id number]    ECHO as above  TELEMETRY (personally reviewed): Sinus rhythm rate 50s  EKG (personally reviewed): normal sinus rhythm rate 91 bpm, no acute ischemic changes  Data reviewed by me 07/25/2024: last 24h vitals tele labs imaging I/O ED provider note, admission H&P, hospitalist progress note  Principal Problem:   COPD (chronic obstructive pulmonary disease) (HCC) Active Problems:   COPD  with acute exacerbation (HCC)   Acute hypoxic respiratory failure (HCC)    ASSESSMENT AND PLAN:  Cindy Gutierrez is a  63 y.o. female  with a past medical history of chronic HFpEF, coronary artery disease, mitral regurgitation, pulm hypertension, COPD, hypertension, tobacco use, depression, hepatitis C who presented to the ED on 07/22/2024 for cough, wheezing, shortness of breath.  Echo done 07/22/2024 revealed reduced EF and WMA's.  Cardiology was consulted for further evaluation.   # New HFrEF # COPD exacerbation  # Acute respiratory failure with hypoxia Patient presents with SOB, cough. BNP elevated at 545. CXR without significant acute abnormality. Echo this admission with a EF 40%, anteroseptal, mid inferolateral, mid anterolateral, mid inferoseptal, mid anterior, mid inferior hypokinesis noted, grade 1 diastolic dysfunction, mild MR. - Continue BiDil 20-37.5 mg 3 times daily and metoprolol  succinate 12.5 mg daily.  Options limited for GDMT as she has previously not tolerated multiple medications. - Will continue IV lasix  40 mg twice daily. - Continue spironolactone  25 mg daily.  Previously prescribed Farxiga  but has not been taking this due to side effects - did state she would be willing to try Jardiance but not while she is in the hospital. - COPD management per primary team.   # Coronary artery disease # Hypertension Known hx of CAD, LHC 2011 at [facility] - unable to view results. Trops trended 12 > 57 > 92. EKG with sinus rhythm, rate 91 bpm without acute ischemic changes. New WMAs noted on echo this admission as above. ?stress-induced cardiomyopathy - Will plan for LHC for additional evaluation, likely tomorrow.  Discussed procedure in detail with patient today. - Documented intolerance to multiple BP medications including lisinopril, losartan , coreg, and recently stopped amlodipine  due to side effects.   This patient's plan of care was discussed and created with Dr. Wilburn and he is in agreement.  Signed: Danita Bloch, PA-C  07/25/2024, 11:51 AM [facility]

## 2024-07-25 NOTE — Progress Notes (Signed)
 PHARMACY - ANTICOAGULATION CONSULT NOTE  Pharmacy Consult for heparin  Indication: chest pain/ACS  Allergies  Allergen Reactions   Sulfa Antibiotics Hives and Rash   Bee Pollen Hives   Hydrochlorothiazide      Chest tightness   Lisinopril Nausea Only   Losartan     Penicillins Hives   Penicillins    Sulfa Antibiotics    Tiotropium Bromide     Other reaction(s): Cough   Carvedilol     Patient Measurements: Height: 5' 10 (177.8 cm) Weight: 77.1 kg (170 lb) IBW/kg (Calculated) : 68.5 HEPARIN  DW (KG): 77.1  Vital Signs: Temp: 98.2 F (36.8 C) (10/22 2327) Temp Source: Oral (10/22 1549) BP: 131/61 (10/22 2327) Pulse Rate: 65 (10/22 2327)  Labs: Recent Labs    07/22/24 0538 07/22/24 0731 07/22/24 1021 07/22/24 1919 07/23/24 0011 07/23/24 0532 07/23/24 0628 07/24/24 0426 07/24/24 1231 07/24/24 2005 07/25/24 0242  HGB 12.0  --   --   --   --  11.5*  --  11.5*  --   --  10.8*  HCT 38.5  --   --   --   --  36.9  --  37.7  --   --  35.6*  PLT 235  --   --   --   --  206  --  246  --   --  229  APTT  --   --   --  137*  --   --   --   --   --   --   --   LABPROT  --   --   --  14.0  --   --   --   --   --   --   --   INR  --   --   --  1.0  --   --   --   --   --   --   --   HEPARINUNFRC  --   --   --   --    < >  --    < > 0.23* 0.19* 0.72* 0.59  CREATININE 0.73  --   --   --   --  0.69  --  0.85  --   --  0.82  TROPONINIHS 12 57* 92*  --   --   --   --   --   --   --   --    < > = values in this interval not displayed.    Estimated Creatinine Clearance: 75 mL/min (by C-G formula based on SCr of 0.82 mg/dL).   Medical History: Past Medical History:  Diagnosis Date   Aortic valve regurgitation    CHF (congestive heart failure) (HCC)    COPD (chronic obstructive pulmonary disease) (HCC)    Coronary artery disease    Depression    Hepatitis C    Hyperparathyroidism    Hypertension    Mitral valve regurgitation    Neuropathy    to R arm only   Non-toxic  multinodular goiter    Pulmonary hypertension (HCC) 2022   noted on CT   Pulmonary nodule    Thyroid  disease     Medications:  Scheduled:   albuterol   2.5 mg Nebulization TID   aspirin   81 mg Oral Daily   celecoxib   200 mg Oral BID   escitalopram   10 mg Oral Daily   fluticasone  furoate-vilanterol  1 puff Inhalation Daily   isosorbide-hydrALAZINE   1 tablet Oral TID  metoprolol  succinate  12.5 mg Oral Daily   predniSONE   40 mg Oral Q breakfast   pregabalin   150 mg Oral BID   rosuvastatin   20 mg Oral Daily   spironolactone   25 mg Oral Daily    Assessment:  63 y/o female presenting with shortness of breath. PMH significant for COPD, HFpEF, CAD, anxiety/depression, hepatitis C, hyperparathyroidism, HTN, peripheral neuropathy. In ED, found to have elevated troponin levels. Pharmacy has been consulted to initiate heparin  infusion. Per chart review, patient is not on anticoagulation prior to admission.  1021 0011 HL 0.43, therapeutic x 1 1021 0628 HL 0.4 1022 0426 HL 0.23, SUBtherapeutic  1022 1231 HL 0.19  1022 2005 HL 0.72, SUPRAtherapeutic 1023 0242 HL 0.59, therapeutic X 1    Goal of Therapy:  Heparin  level 0.3-0.7 units/ml Monitor platelets by anticoagulation protocol: Yes    Plan:  10/23:  HL @ 0242 = 0.59, therapeutic X 1 - Will continue pt on current rate and recheck HL in 6 hrs. - CBC daily while on heparin .   Thank you for involving pharmacy in this patient's care.   Silver Selinda BIRCH, PharmD Clinical Pharmacist 07/25/2024 3:21 AM

## 2024-07-25 NOTE — Progress Notes (Signed)
 Heart Failure Stewardship Pharmacy Note  PCP: [facility], [facility] PCP-Cardiologist: None  HPI: Cindy Gutierrez is a 63 y.o. female with COPD, HFpEF, CAD, anxiety/depression, hepatitis C, hyperparathyroidism, HTN, peripheral neuropathy, mitral valve regurgitation, pulmonary hypertension who presented with wheezing and shortness of breath. On admission, BNP was 545.2, HS-troponin was 57, and lactic acid was 1.3. Chest x-ray noted consistent with COPD, no acute abnormalities.   Pertinent cardiac history: History of LHC in 12/2009 noted, though unable to see results. Reportedly noted CAD. TTE 05/2021 with LVEF of 65-70% and G2DD. TTE 07/22/24 showed LVEF reduced to 40% with moderate LVH, G1DD, mild MR, mild AR.  Pertinent Lab Values: Creatinine  Date Value Ref Range Status  09/24/2013 0.72 0.60 - 1.30 mg/dL Final   Creatinine, Ser  Date Value Ref Range Status  07/25/2024 0.82 0.44 - 1.00 mg/dL Final   BUN  Date Value Ref Range Status  07/25/2024 28 (H) 8 - 23 mg/dL Final  87/76/7985 9 7 - 18 mg/dL Final   Potassium  Date Value Ref Range Status  07/25/2024 4.0 3.5 - 5.1 mmol/L Final  09/24/2013 3.9 3.5 - 5.1 mmol/L Final   Sodium  Date Value Ref Range Status  07/25/2024 145 135 - 145 mmol/L Final  09/24/2013 141 136 - 145 mmol/L Final   B Natriuretic Peptide  Date Value Ref Range Status  07/22/2024 545.2 (H) 0.0 - 100.0 pg/mL Final    Comment:    Performed at [facility], [address] [place], [place] [address]   Magnesium   Date Value Ref Range Status  07/25/2024 2.6 (H) 1.7 - 2.4 mg/dL Final    Comment:    Performed at [facility], [address] [place], [place] [address]   Hgb A1c MFr Bld  Date Value Ref Range Status  11/18/2023 5.5 4.8 - 5.6 % Final    Comment:    (NOTE) Pre diabetes:          5.7%-6.4%  Diabetes:              >6.4%  Glycemic control for   <7.0% adults with diabetes    TSH  Date Value Ref Range Status   11/18/2023 1.252 0.350 - 4.500 uIU/mL Final    Comment:    Performed by a 3rd Generation assay with a functional sensitivity of <=0.01 uIU/mL. Performed at [facility], [address] [place], [place] [address]     Vital Signs:  Temp:  [98.1 F (36.7 C)-98.6 F (37 C)] 98.1 F (36.7 C) (10/23 0823) Pulse Rate:  [56-65] 56 (10/23 0823) Cardiac Rhythm: Sinus bradycardia (10/23 0803) Resp:  [17-20] 20 (10/23 0459) BP: (131-161)/(53-61) 161/60 (10/23 0823) SpO2:  [98 %-100 %] 100 % (10/23 0823)  Intake/Output Summary (Last 24 hours) at 07/25/2024 0936 Last data filed at 07/24/2024 1919 Gross per 24 hour  Intake 440 ml  Output 200 ml  Net 240 ml    Current Heart Failure Medications:  Loop diuretic: furosemide  40 mg IV BID Beta-Blocker: metoprolol  succinate 12.5 mg daily ACEI/ARB/ARNI: none (previous intolerance) MRA: spironolactone  25 mg daily SGLT2i: none  Other: BiDil 1 tablet   Prior to admission Heart Failure Medications:  Loop diuretic: furosemide  20 mg prn Beta-Blocker: none ACEI/ARB/ARNI: none MRA: spironolactone  25 mg daily SGLT2i: Stopped Farxiga  due to perineal tenderness Other: none  Assessment: 1. 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 NYHA class III-IV symptoms.  -Symptoms: Reports shortness of breath is  improving. Has productive cough yesterday. LEE is improving. Orthopnea present. -Volume: May still have some additional volume. Furosemide  40 mg IV BID stopped, creatinine/BUN trending up slightly. -Hemodynamics: BP is much improved to 160s systolic. HR 50-60s. -BB: Previously did not tolerate carvedilol due to shortness of breath with COPD. Metoprolol  started at low dose, could potentially use bisoprolol if patient has issues with metoprolol .  -ACEI/ARB/ARNI: Previously had rash with losartan  and other unknown issues with lisinopril.  -MRA: Continue home spironolactone  25 mg daily.   -SGLT2i: previously experienced perineal tenderness with Farxiga . Patient has Jardiance at home and is willing to try in the future -BiDil started with improvement in BP. Consider titration to 2 tablets TID today.  Plan: 1) Medication changes recommended at this time: -Consdier icnreasing BiDil to target dose of 2 tablets TID.  2) Patient assistance: -Medications acquired via the [facility].  3) Education: - Patient has been educated on current HF medications and potential additions to HF medication regimen - Patient verbalizes understanding that over the next few months, these medication doses may change and more medications may be added to optimize HF regimen - Patient has been educated on basic disease state pathophysiology and goals of therapy  Medication Assistance / Insurance Benefits Check: Does the patient have prescription insurance?    Type of insurance plan:  Does the patient qualify for medication assistance through manufacturers or grants? No  Patient is [facility] medications [facility]  [facility]      Please do not hesitate to reach out with questions or concerns,  Jaun Bash, PharmD, CPP, BCPS, [facility] Heart Failure Pharmacist  Phone - [phone] 07/25/2024 9:36 AM

## 2024-07-25 NOTE — Progress Notes (Signed)
 PHARMACY - ANTICOAGULATION CONSULT NOTE  Pharmacy Consult for heparin  Indication: chest pain/ACS  Allergies  Allergen Reactions   Sulfa Antibiotics Hives and Rash   Bee Pollen Hives   Hydrochlorothiazide      Chest tightness   Lisinopril Nausea Only   Losartan     Penicillins Hives   Penicillins    Sulfa Antibiotics    Tiotropium Bromide     Other reaction(s): Cough   Carvedilol     Patient Measurements: Height: 5' 10 (177.8 cm) Weight: 77.1 kg (170 lb) IBW/kg (Calculated) : 68.5 HEPARIN  DW (KG): 77.1  Vital Signs: Temp: 98.1 F (36.7 C) (10/23 0823) Temp Source: Oral (10/23 0823) BP: 161/60 (10/23 0823) Pulse Rate: 56 (10/23 0823)  Labs: Recent Labs    07/22/24 1021 07/22/24 1919 07/23/24 0011 07/23/24 0532 07/23/24 9371 07/24/24 0426 07/24/24 1231 07/24/24 2005 07/25/24 0242 07/25/24 0838  HGB  --   --    < > 11.5*  --  11.5*  --   --  10.8*  --   HCT  --   --   --  36.9  --  37.7  --   --  35.6*  --   PLT  --   --   --  206  --  246  --   --  229  --   APTT  --  137*  --   --   --   --   --   --   --   --   LABPROT  --  14.0  --   --   --   --   --   --   --   --   INR  --  1.0  --   --   --   --   --   --   --   --   HEPARINUNFRC  --   --    < >  --    < > 0.23*   < > 0.72* 0.59 0.47  CREATININE  --   --   --  0.69  --  0.85  --   --  0.82  --   TROPONINIHS 92*  --   --   --   --   --   --   --   --   --    < > = values in this interval not displayed.    Estimated Creatinine Clearance: 75 mL/min (by C-G formula based on SCr of 0.82 mg/dL).   Medical History: Past Medical History:  Diagnosis Date   Aortic valve regurgitation    CHF (congestive heart failure) (HCC)    COPD (chronic obstructive pulmonary disease) (HCC)    Coronary artery disease    Depression    Hepatitis C    Hyperparathyroidism    Hypertension    Mitral valve regurgitation    Neuropathy    to R arm only   Non-toxic multinodular goiter    Pulmonary hypertension (HCC) 2022    noted on CT   Pulmonary nodule    Thyroid  disease     Medications:  Scheduled:   albuterol   2.5 mg Nebulization TID   aspirin   81 mg Oral Daily   celecoxib   200 mg Oral BID   escitalopram   10 mg Oral Daily   fluticasone  furoate-vilanterol  1 puff Inhalation Daily   isosorbide-hydrALAZINE   1 tablet Oral TID   metoprolol  succinate  12.5 mg Oral Daily   predniSONE   40 mg  Oral Q breakfast   pregabalin   150 mg Oral BID   rosuvastatin   20 mg Oral Daily   spironolactone   25 mg Oral Daily    Assessment:  63 y/o female presenting with shortness of breath. PMH significant for COPD, HFpEF, CAD, anxiety/depression, hepatitis C, hyperparathyroidism, HTN, peripheral neuropathy. In ED, found to have elevated troponin levels. Pharmacy has been consulted to initiate heparin  infusion. Per chart review, patient is not on anticoagulation prior to admission.  1021 0011 HL 0.43, therapeutic x 1 1021 0628 HL 0.4 1022 0426 HL 0.23, SUBtherapeutic  1022 1231 HL 0.19  1022 2005 HL 0.72, SUPRAtherapeutic 1023 0242 HL 0.59, therapeutic X 1  1023 0838 HL 0.47    Goal of Therapy:  Heparin  level 0.3-0.7 units/ml Monitor platelets by anticoagulation protocol: Yes    Plan:  Heparin  level is therapeutic. Will continue heparin  infusion at 1150 unit/hr Recheck heparin  level and CBC with AM labs.   Thank you for involving pharmacy in this patient's care.   Cathaleen GORMAN Blanch, PharmD Clinical Pharmacist 07/25/2024 9:46 AM

## 2024-07-26 ENCOUNTER — Encounter: Payer: Self-pay | Admitting: Internal Medicine

## 2024-07-26 ENCOUNTER — Encounter
Admission: EM | Disposition: A | Discharge status: Home / Self Care | Payer: Self-pay | Source: Home / Self Care | Attending: Internal Medicine

## 2024-07-26 DIAGNOSIS — J441 Chronic obstructive pulmonary disease with (acute) exacerbation: Secondary | ICD-10-CM | POA: Diagnosis not present

## 2024-07-26 DIAGNOSIS — I2583 Coronary atherosclerosis due to lipid rich plaque: Secondary | ICD-10-CM

## 2024-07-26 DIAGNOSIS — I2510 Atherosclerotic heart disease of native coronary artery without angina pectoris: Secondary | ICD-10-CM

## 2024-07-26 DIAGNOSIS — J9601 Acute respiratory failure with hypoxia: Secondary | ICD-10-CM | POA: Diagnosis not present

## 2024-07-26 HISTORY — PX: LEFT HEART CATH AND CORONARY ANGIOGRAPHY: CATH118249

## 2024-07-26 LAB — CBC WITH DIFFERENTIAL/PLATELET
Abs Immature Granulocytes: 0.04 K/uL (ref 0.00–0.07)
Basophils Absolute: 0 K/uL (ref 0.0–0.1)
Basophils Relative: 0 %
Eosinophils Absolute: 0 K/uL (ref 0.0–0.5)
Eosinophils Relative: 0 %
HCT: 34.6 % — ABNORMAL LOW (ref 36.0–46.0)
Hemoglobin: 10.7 g/dL — ABNORMAL LOW (ref 12.0–15.0)
Immature Granulocytes: 0 %
Lymphocytes Relative: 23 %
Lymphs Abs: 2.1 K/uL (ref 0.7–4.0)
MCH: 27.9 pg (ref 26.0–34.0)
MCHC: 30.9 g/dL (ref 30.0–36.0)
MCV: 90.1 fL (ref 80.0–100.0)
Monocytes Absolute: 0.7 K/uL (ref 0.1–1.0)
Monocytes Relative: 8 %
Neutro Abs: 6.4 K/uL (ref 1.7–7.7)
Neutrophils Relative %: 69 %
Platelets: 204 K/uL (ref 150–400)
RBC: 3.84 MIL/uL — ABNORMAL LOW (ref 3.87–5.11)
RDW: 14.1 % (ref 11.5–15.5)
WBC: 9.3 K/uL (ref 4.0–10.5)
nRBC: 0 % (ref 0.0–0.2)

## 2024-07-26 LAB — COMPREHENSIVE METABOLIC PANEL WITH GFR
ALT: 11 U/L (ref 0–44)
AST: 15 U/L (ref 15–41)
Albumin: 3.3 g/dL — ABNORMAL LOW (ref 3.5–5.0)
Alkaline Phosphatase: 53 U/L (ref 38–126)
Anion gap: 7 (ref 5–15)
BUN: 22 mg/dL (ref 8–23)
CO2: 30 mmol/L (ref 22–32)
Calcium: 8.3 mg/dL — ABNORMAL LOW (ref 8.9–10.3)
Chloride: 105 mmol/L (ref 98–111)
Creatinine, Ser: 0.68 mg/dL (ref 0.44–1.00)
GFR, Estimated: >60 mL/min (ref >60)
Glucose, Bld: 87 mg/dL (ref 70–99)
Potassium: 4.2 mmol/L (ref 3.5–5.1)
Sodium: 142 mmol/L (ref 135–145)
Total Bilirubin: 0.4 mg/dL (ref 0.0–1.2)
Total Protein: 6.2 g/dL — ABNORMAL LOW (ref 6.5–8.1)

## 2024-07-26 LAB — PHOSPHORUS: Phosphorus: 3.4 mg/dL (ref 2.5–4.6)

## 2024-07-26 LAB — CARDIAC CATHETERIZATION: Cath EF Quantitative: 40 %

## 2024-07-26 LAB — HEPARIN LEVEL (UNFRACTIONATED): Heparin Unfractionated: 0.51 [IU]/mL (ref 0.30–0.70)

## 2024-07-26 LAB — MAGNESIUM: Magnesium: 2.7 mg/dL — ABNORMAL HIGH (ref 1.7–2.4)

## 2024-07-26 SURGERY — LEFT HEART CATH AND CORONARY ANGIOGRAPHY
Anesthesia: Moderate Sedation

## 2024-07-26 MED ORDER — SODIUM CHLORIDE 0.9% FLUSH: 3.0000 mL | INTRAVENOUS | Status: DC | PRN

## 2024-07-26 MED ORDER — LIDOCAINE HCL (PF) 1 % IJ SOLN
INTRAMUSCULAR | Status: DC | PRN
  Administered 2024-07-26: 2 mL

## 2024-07-26 MED ORDER — FREE WATER
250.0000 mL | Freq: Once | Status: AC
  Administered 2024-07-26: 250 mL via ORAL

## 2024-07-26 MED ORDER — MIDAZOLAM HCL (PF) 2 MG/2ML IJ SOLN
INTRAMUSCULAR | Status: DC | PRN
  Administered 2024-07-26: .5 mg via INTRAVENOUS

## 2024-07-26 MED ORDER — VERAPAMIL HCL 2.5 MG/ML IV SOLN
INTRAVENOUS | Status: AC
  Filled 2024-07-26: qty 2

## 2024-07-26 MED ORDER — HEPARIN SODIUM (PORCINE) 1000 UNIT/ML IJ SOLN
INTRAMUSCULAR | Status: AC
  Filled 2024-07-26: qty 10

## 2024-07-26 MED ORDER — FENTANYL CITRATE (PF) 100 MCG/2ML IJ SOLN
INTRAMUSCULAR | Status: AC
  Filled 2024-07-26: qty 2

## 2024-07-26 MED ORDER — FENTANYL CITRATE (PF) 100 MCG/2ML IJ SOLN
INTRAMUSCULAR | Status: DC | PRN
  Administered 2024-07-26: 25 ug via INTRAVENOUS

## 2024-07-26 MED ORDER — ASPIRIN 81 MG PO CHEW
81.0000 mg | CHEWABLE_TABLET | ORAL | Status: AC
  Administered 2024-07-26: 81 mg via ORAL

## 2024-07-26 MED ORDER — SODIUM CHLORIDE 0.9% FLUSH
3.0000 mL | Freq: Two times a day (BID) | INTRAVENOUS | Status: DC
  Administered 2024-07-26 – 2024-07-27 (×3): 3 mL via INTRAVENOUS

## 2024-07-26 MED ORDER — HYDRALAZINE HCL 20 MG/ML IJ SOLN
INTRAMUSCULAR | Status: AC
  Filled 2024-07-26: qty 1

## 2024-07-26 MED ORDER — MIDAZOLAM HCL 2 MG/2ML IJ SOLN
INTRAMUSCULAR | Status: AC
  Filled 2024-07-26: qty 2

## 2024-07-26 MED ORDER — HEPARIN (PORCINE) IN NACL 1000-0.9 UT/500ML-% IV SOLN
INTRAVENOUS | Status: AC
  Filled 2024-07-26: qty 1000

## 2024-07-26 MED ORDER — HEPARIN SODIUM (PORCINE) 1000 UNIT/ML IJ SOLN
INTRAMUSCULAR | Status: DC | PRN
  Administered 2024-07-26: 2500 [IU] via INTRAVENOUS

## 2024-07-26 MED ORDER — LIDOCAINE HCL 1 % IJ SOLN
INTRAMUSCULAR | Status: AC
  Filled 2024-07-26: qty 20

## 2024-07-26 MED ORDER — HYDRALAZINE HCL 20 MG/ML IJ SOLN
10.0000 mg | INTRAMUSCULAR | Status: AC | PRN
  Administered 2024-07-26: 10 mg via INTRAVENOUS

## 2024-07-26 MED ORDER — HEPARIN (PORCINE) IN NACL 1000-0.9 UT/500ML-% IV SOLN
INTRAVENOUS | Status: DC | PRN
  Administered 2024-07-26: 1000 mL

## 2024-07-26 MED ORDER — FUROSEMIDE 40 MG PO TABS
40.0000 mg | ORAL_TABLET | Freq: Every day | ORAL | Status: DC
  Administered 2024-07-26 – 2024-07-27 (×2): 40 mg via ORAL
  Filled 2024-07-26 (×2): qty 1

## 2024-07-26 MED ORDER — SODIUM CHLORIDE 0.9 % IV SOLN
250.0000 mL | INTRAVENOUS | Status: AC | PRN
  Administered 2024-07-26: 250 mL via INTRAVENOUS

## 2024-07-26 MED ORDER — VERAPAMIL HCL 2.5 MG/ML IV SOLN
INTRAVENOUS | Status: DC | PRN
  Administered 2024-07-26: 2.5 mg via INTRACORONARY

## 2024-07-26 MED ORDER — IOHEXOL 300 MG/ML  SOLN
INTRAMUSCULAR | Status: DC | PRN
  Administered 2024-07-26: 49 mL

## 2024-07-26 SURGICAL SUPPLY — 9 items
CATH INFINITI 5 FR JL3.5 (CATHETERS) IMPLANT
CATH INFINITI JR4 5F (CATHETERS) IMPLANT
DEVICE RAD TR BAND REGULAR (VASCULAR PRODUCTS) IMPLANT
DRAPE BRACHIAL (DRAPES) IMPLANT
GLIDESHEATH SLEND SS 6F .021 (SHEATH) IMPLANT
GUIDEWIRE INQWIRE 1.5J.035X260 (WIRE) IMPLANT
PACK CARDIAC CATH (CUSTOM PROCEDURE TRAY) ×1 IMPLANT
SET ATX-X65L (MISCELLANEOUS) IMPLANT
STATION PROTECTION PRESSURIZED (MISCELLANEOUS) IMPLANT

## 2024-07-26 NOTE — Progress Notes (Signed)
 Consent signed and in chart. No concerns noted at this time.

## 2024-07-26 NOTE — Progress Notes (Signed)
 Heart Failure Stewardship Pharmacy Note  PCP: [facility], [facility] PCP-Cardiologist: None  HPI: Cindy Gutierrez is a 63 y.o. female with COPD, HFpEF, CAD, anxiety/depression, hepatitis C, hyperparathyroidism, HTN, peripheral neuropathy, mitral valve regurgitation, pulmonary hypertension who presented with wheezing and shortness of breath. On admission, BNP was 545.2, HS-troponin was 57, and lactic acid was 1.3. Chest x-ray noted consistent with COPD, no acute abnormalities.   Pertinent cardiac history: History of LHC in 12/2009 noted, though unable to see results. Reportedly noted CAD. TTE 05/2021 with LVEF of 65-70% and G2DD. TTE 07/22/24 showed LVEF reduced to 40% with moderate LVH, G1DD, mild MR, mild AR.  Pertinent Lab Values: Creatinine  Date Value Ref Range Status  09/24/2013 0.72 0.60 - 1.30 mg/dL Final   Creatinine, Ser  Date Value Ref Range Status  07/26/2024 0.68 0.44 - 1.00 mg/dL Final   BUN  Date Value Ref Range Status  07/26/2024 22 8 - 23 mg/dL Final  87/76/7985 9 7 - 18 mg/dL Final   Potassium  Date Value Ref Range Status  07/26/2024 4.2 3.5 - 5.1 mmol/L Final  09/24/2013 3.9 3.5 - 5.1 mmol/L Final   Sodium  Date Value Ref Range Status  07/26/2024 142 135 - 145 mmol/L Final  09/24/2013 141 136 - 145 mmol/L Final   B Natriuretic Peptide  Date Value Ref Range Status  07/22/2024 545.2 (H) 0.0 - 100.0 pg/mL Final    Comment:    Performed at [facility], [address] [place], [place] [address]   Magnesium   Date Value Ref Range Status  07/26/2024 2.7 (H) 1.7 - 2.4 mg/dL Final    Comment:    Performed at [facility], [address] [place], [place] [address]   Hgb A1c MFr Bld  Date Value Ref Range Status  11/18/2023 5.5 4.8 - 5.6 % Final    Comment:    (NOTE) Pre diabetes:          5.7%-6.4%  Diabetes:              >6.4%  Glycemic control for   <7.0% adults with diabetes    TSH  Date Value Ref Range Status  11/18/2023  1.252 0.350 - 4.500 uIU/mL Final    Comment:    Performed by a 3rd Generation assay with a functional sensitivity of <=0.01 uIU/mL. Performed at [facility], [address] [place], [place] [address]     Vital Signs:  Temp:  [98 F (36.7 C)-98.9 F (37.2 C)] 98.9 F (37.2 C) (10/24 0538) Pulse Rate:  [55-66] 55 (10/24 0538) Cardiac Rhythm: Normal sinus rhythm (10/23 1900) Resp:  [17-20] 19 (10/24 0538) BP: (140-161)/(48-61) 148/59 (10/24 0538) SpO2:  [98 %-100 %] 98 % (10/24 0538) FiO2 (%):  [36 %] 36 % (10/23 2111)  Intake/Output Summary (Last 24 hours) at 07/26/2024 0718 Last data filed at 07/26/2024 0500 Gross per 24 hour  Intake 880 ml  Output 800 ml  Net 80 ml    Current Heart Failure Medications:  Loop diuretic: furosemide  40 mg IV BID Beta-Blocker: metoprolol  succinate 12.5 mg daily ACEI/ARB/ARNI: none (previous intolerance) MRA: spironolactone  25 mg daily SGLT2i: none  Other: BiDil 1 tablet   Prior to admission Heart Failure Medications:  Loop diuretic: furosemide  20 mg prn Beta-Blocker: none ACEI/ARB/ARNI: none MRA: spironolactone  25 mg daily SGLT2i: Stopped Farxiga  due to perineal tenderness Other: none  Assessment: 1. 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 NYHA class  III-IV symptoms.  -Symptoms: Reports shortness of breath is improving. Has productive cough yesterday. LEE is improving. Orthopnea present. -Volume: May still have some additional volume. Furosemide  40 mg IV BID stopped, creatinine/BUN trending up slightly. -Hemodynamics: BP is much improved to 160s systolic. HR 50-60s. -BB: Previously did not tolerate carvedilol due to shortness of breath with COPD. Metoprolol  started at low dose, could potentially use bisoprolol if patient has issues with metoprolol .  -ACEI/ARB/ARNI: Previously had rash with losartan  and other unknown issues with lisinopril.  -MRA: Continue home  spironolactone  25 mg daily.  -SGLT2i: previously experienced perineal tenderness with Farxiga . Patient has Jardiance at home and is willing to try in the future -BiDil started with improvement in BP. Consider titration to 2 tablets TID today.  Plan: 1) Medication changes recommended at this time: -Consdier icnreasing BiDil to target dose of 2 tablets TID.  2) Patient assistance: -Medications acquired via the [facility].  3) Education: - Patient has been educated on current HF medications and potential additions to HF medication regimen - Patient verbalizes understanding that over the next few months, these medication doses may change and more medications may be added to optimize HF regimen - Patient has been educated on basic disease state pathophysiology and goals of therapy  Medication Assistance / Insurance Benefits Check: Does the patient have prescription insurance?    Type of insurance plan:  Does the patient qualify for medication assistance through manufacturers or grants? No  Patient is eligible [facility] receive medications from [facility]  [facility]      Please do not hesitate to reach out with questions or concerns,  Jaun Bash, PharmD, CPP, BCPS, [facility] Heart Failure Pharmacist  Phone - [phone] 07/26/2024 7:18 AM

## 2024-07-26 NOTE — Progress Notes (Signed)
 Patient returned from cath lab. Vitals signs completed an documented. Morning medications given, MD aware. Patient is in no signs of distress.

## 2024-07-26 NOTE — Progress Notes (Signed)
 PROGRESS NOTE    Cindy Gutierrez  FMW:[id number] DOB: 19-Jul-1961 DOA: 07/22/2024 [facility]  Chief Complaint  Patient presents with   Respiratory Distress    Hospital Course:  Cindy Gutierrez is a 63 year old female with COPD, heart failure with preserved EF, CAD, anxiety, depression, hepatitis C, hyperparathyroidism, hypertension, tobacco abuse, peripheral neuropathy, mitral valve regurg, pulmonary hypertension, who presented with worsening shortness of breath patient called EMS who found patient to be in severe respiratory distress requiring 6 L O2.  On arrival she was tachypneic, requiring BiPAP, CXR negative for acute infiltrates, lab work mostly unremarkable.  Patient was admitted for COPD exacerbation and started on Solu-Medrol . Echocardiogram was performed which revealed LVEF reduced to 40% as well as new regional wall motion abnormalities suspicious for ACS.  High-sensitivity troponin demonstrated an uptrend from 52-92.  Cardiology was consulted. Patient was started on heparin  drip and initiated on IV Lasix .   10/23: cardiac cath tomorrow 10/24: cath showed no CAD  Subjective:  Feeling much better, some dyspnea. Son at bedside  Objective: Vitals:   07/26/24 1045 07/26/24 1100 07/26/24 1135 07/26/24 1726  BP: (!) 141/45 (!) 154/56 (!) 140/48 (!) 116/47  Pulse: (!) 55 (!) 59 (!) 59 (!) 57  Resp: 18 18 18 18   Temp:   98.8 F (37.1 C) 98 F (36.7 C)  TempSrc:   Oral Oral  SpO2: 97% 97% 97% 97%  Weight:      Height:        Intake/Output Summary (Last 24 hours) at 07/26/2024 2018 Last data filed at 07/26/2024 1800 Gross per 24 hour  Intake 900 ml  Output 1300 ml  Net -400 ml   Filed Weights   07/22/24 0524  Weight: 77.1 kg    Examination:  Physical Exam  Constitutional: In no distress.  Cardiovascular: Normal rate, regular rhythm. No lower extremity edema  Pulmonary: Non labored breathing on [place], diffuse wheezing.  Abdominal: Soft. Non distended  and non tender Musculoskeletal: Normal range of motion.     Neurological: Alert and oriented to person, place, and time. Non focal  Skin: Skin is warm and dry.   Assessment & Plan:  Principal Problem:   COPD (chronic obstructive pulmonary disease) (HCC) Active Problems:   Acute hypoxic respiratory failure (HCC)   COPD with acute exacerbation (HCC)   Acute hypoxic respiratory failure COPD with acute exacerbation --Pt is now weaned off to 2 liter O2.  --Legionella pending, mycoplasma negative  -- Will consider CTA chest if continued desaturation --Discussed with patient that she may need to bring her long acting inhaler from home given she does not like formulary equivalent of her home inhaler (she states it is a powder mist) - Change Prednisone  - Continue scheduled inhalers and as needed DuoNebs - Encourage incentive parameter and flutter valve - On doxycycline  d3, will continue for 5 day course   Acute heart failure with reduced EF - New diagnosis for this patient - Lasix  40 mg PO daily per cardiology.  Follow strict I's and O's - TTE LVEF 40% with regional wall motion abnormalities, grade 1 diastolic dysfunction - Has been initiated on GDMT, somewhat limited by extensive allergy profile  CAD - Known history of CAD.  [facility] heart cath at [facility] - Troponin 12->57->92 this admission.  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   Hypertensive urgency HTN - Blood pressure very uncontrolled on admission with SBP over 200 - Patient has documented intolerance and allergies to multiple different blood pressure medications including ACE, ARB, Coreg.  She reports she recently discontinued amlodipine  due to side effects - Blood pressure better controlled now - Will  continue to increase and titrate medications as patient can tolerate --PRN hydralazine  and labetolol  History of tobacco abuse - Quit smoking 30 years ago.  Anxiety Depression - Continue home meds - May require intermittent Ativan  for breakthrough anxiety.  Peripheral neuropathy - Continue home meds  Mitral valve regurg Pulmonary hypertension - Continue home meds.  Cardiology consulted as above.  Hepatitis C Hyperparathyroidism -- Cont home meds   DVT prophylaxis: Heparin  drip per ACS protocol   Code Status: Full Code Disposition:  likely DC tomorrow  Consultants:  Cardio  Procedures:  Cath on 10/24  Antimicrobials:  Anti-infectives (From admission, onward)    Start     Dose/Rate Route Frequency Ordered Stop   07/25/24 2200  doxycycline  (VIBRA -TABS) tablet 100 mg        100 mg Oral Every 12 hours 07/25/24 1233 07/27/24 0959   07/22/24 0900  doxycycline  (VIBRAMYCIN ) 100 mg in sodium chloride  0.9 % 250 mL IVPB  Status:  Discontinued        100 mg 125 mL/hr over 120 Minutes Intravenous Every 12 hours 07/22/24 0801 07/25/24 1233       Data Reviewed: I have personally reviewed following labs and imaging studies CBC: Recent Labs  Lab 07/22/24 0538 07/23/24 0532 07/24/24 0426 07/25/24 0242 07/26/24 0629  WBC 9.4 8.1 12.1* 11.1* 9.3  NEUTROABS 6.2  --  10.8* 9.0* 6.4  HGB 12.0 11.5* 11.5* 10.8* 10.7*  HCT 38.5 36.9 37.7 35.6* 34.6*  MCV 90.0 87.9 90.4 91.3 90.1  PLT 235 206 246 229 204   Basic Metabolic Panel: Recent Labs  Lab 07/22/24 0538 07/23/24 0532 07/24/24 0426 07/25/24 0242 07/26/24 0629  NA 146* 144 146* 145 142  K 3.5 3.9 4.1 4.0 4.2  CL 111 106 103 107 105  CO2 26 22 31 29 30   GLUCOSE 133* 174* 137* 116* 87  BUN 15 16 24* 28* 22  CREATININE 0.73 0.69 0.85 0.82 0.68  CALCIUM  8.4* 8.6* 8.3* 8.1* 8.3*  MG  --   --  1.8 2.6* 2.7*  PHOS  --   --  4.2 3.5 3.4   GFR: Estimated Creatinine Clearance: 76.8 mL/min (by C-G formula based on SCr  of 0.68 mg/dL). Liver Function Tests: Recent Labs  Lab 07/22/24 0538 07/24/24 0426 07/25/24 0242 07/26/24 0629  AST 16 20 15 15   ALT 10 10 8 11   ALKPHOS 80 65 61 53  BILITOT 0.4 0.3 0.4 0.4  PROT 6.5 6.7 5.9* 6.2*  ALBUMIN 3.7 3.7 3.2* 3.3*   CBG: No results for input(s): GLUCAP in the last 168 hours.  Recent Results (from the past 240 hours)  Resp panel by RT-PCR (RSV, Flu A&B, Covid) Anterior Nasal Swab     Status: None   Collection Time: 07/22/24  5:38 AM   Specimen: Anterior Nasal Swab  Result Value Ref Range Status   SARS Coronavirus 2 by RT PCR NEGATIVE NEGATIVE Final    Comment: (NOTE) SARS-CoV-2 target nucleic acids are NOT DETECTED.  The SARS-CoV-2 RNA is generally detectable in upper respiratory specimens during the acute phase of infection. The lowest concentration of SARS-CoV-2 viral copies this assay can detect is 138 copies/mL.  A negative result does not preclude SARS-Cov-2 infection and should not be used as the sole basis for treatment or other patient management decisions. A negative result may occur with  improper specimen collection/handling, submission of specimen other than nasopharyngeal swab, presence of viral mutation(s) within the areas targeted by this assay, and inadequate number of viral copies(<138 copies/mL). A negative result must be combined with clinical observations, patient history, and epidemiological information. The expected result is Negative.  Fact Sheet for Patients:  BloggerCourse.com  Fact Sheet for Healthcare Providers:  SeriousBroker.it  This test is no t yet approved or cleared by the [place]  FDA and  has been authorized for detection and/or diagnosis of SARS-CoV-2 by FDA under an Emergency Use Authorization (EUA). This EUA will remain  in effect (meaning this test can be used) for the duration of the COVID-19 declaration under Section 564(b)(1) of the Act,  21 U.S.C.section 360bbb-3(b)(1), unless the authorization is terminated  or revoked sooner.       Influenza A by PCR NEGATIVE NEGATIVE Final   Influenza B by PCR NEGATIVE NEGATIVE Final    Comment: (NOTE) The Xpert Xpress SARS-CoV-2/FLU/RSV plus assay is intended as an aid in the diagnosis of influenza from Nasopharyngeal swab specimens and should not be used as a sole basis for treatment. Nasal washings and aspirates are unacceptable for Xpert Xpress SARS-CoV-2/FLU/RSV testing.  Fact Sheet for Patients: BloggerCourse.com  Fact Sheet for Healthcare Providers: SeriousBroker.it  This test is not yet approved or cleared by the [place]  FDA and has been authorized for detection and/or diagnosis of SARS-CoV-2 by FDA under an Emergency Use Authorization (EUA). This EUA will remain in effect (meaning this test can be used) for the duration of the COVID-19 declaration under Section 564(b)(1) of the Act, 21 U.S.C. section 360bbb-3(b)(1), unless the authorization is terminated or revoked.     Resp Syncytial Virus by PCR NEGATIVE NEGATIVE Final    Comment: (NOTE) Fact Sheet for Patients: BloggerCourse.com  Fact Sheet for Healthcare Providers: SeriousBroker.it  This test is not yet approved or cleared by the [place]  FDA and has been authorized for detection and/or diagnosis of SARS-CoV-2 by FDA under an Emergency Use Authorization (EUA). This EUA will remain in effect (meaning this test can be used) for the duration of the COVID-19 declaration under Section 564(b)(1) of the Act, 21 U.S.C. section 360bbb-3(b)(1), unless the authorization is terminated or revoked.  Performed at [facility], [address] [place], [place] [address]   Blood culture (routine x 2)     Status: None (Preliminary result)   Collection Time: 07/22/24  5:38 AM   Specimen: BLOOD  Result  Value Ref Range Status   Specimen Description BLOOD BLOOD RIGHT WRIST  Final   Special Requests   Final    BOTTLES DRAWN AEROBIC AND ANAEROBIC Blood Culture adequate volume   Culture   Final    NO GROWTH 4 DAYS Performed at [facility], [address], [place], [place] [address]    Report Status PENDING  Incomplete  Blood culture (routine x 2)     Status: None (Preliminary result)   Collection Time: 07/22/24  5:38 AM   Specimen: BLOOD  Result Value Ref Range Status   Specimen Description BLOOD BLOOD LEFT FOREARM  Final   Special Requests   Final    BOTTLES DRAWN AEROBIC AND ANAEROBIC Blood Culture results may not be optimal due to an inadequate volume of blood received in culture bottles   Culture  Final    NO GROWTH 4 DAYS Performed at [facility], [address] [place] [place], [place] [address]    Report Status PENDING  Incomplete     Radiology Studies: CARDIAC CATHETERIZATION Result Date: 07/26/2024   There is mild left ventricular systolic dysfunction.   LV end diastolic pressure is mildly elevated.   The left ventricular ejection fraction is 35-45% by visual estimate.   No indication for antiplatelet therapy at this time . Conclusion Inpatient left heart cath right radial approach Left ventriculogram Mildly depressed left ventricular function globally EF around 40% Coronaries Left main large free of disease LAD large free of disease Circumflex large free of disease RCA large free of disease Right dominant system Intervention deferred not indicated Patient tolerated procedure well No complications     Scheduled Meds:  albuterol   2.5 mg Nebulization TID   aspirin   81 mg Oral Daily   celecoxib   200 mg Oral BID   doxycycline   100 mg Oral Q12H   escitalopram   10 mg Oral Daily   fluticasone  furoate-vilanterol  1 puff Inhalation Daily   furosemide   40 mg Oral Daily   hydrALAZINE        isosorbide-hydrALAZINE   1 tablet Oral TID   metoprolol  succinate  12.5 mg  Oral Daily   pregabalin   150 mg Oral BID   rosuvastatin   20 mg Oral Daily   sodium chloride  flush  3 mL Intravenous Q12H   spironolactone   25 mg Oral Daily   Continuous Infusions:  sodium chloride  250 mL (07/26/24 1203)     LOS: 4 days  MDM: Patient is high risk for one or more organ failure.  They necessitate ongoing hospitalization for continued IV therapies and subsequent lab monitoring. Total time spent interpreting labs and vitals, reviewing the medical record, coordinating care amongst consultants and care team members, directly assessing and discussing care with the patient and/or family: 35 min  Ileanna Gemmill Maree, MD [facility]  To contact the attending physician between 7A-7P please use Epic Chat. To contact the covering physician during after hours 7P-7A, please review Amion.  07/26/2024, 8:18 PM   *This document has been created with the assistance of dictation software. Please excuse typographical errors. *

## 2024-07-26 NOTE — Progress Notes (Signed)
Patient off unit to Cath Lab.

## 2024-07-26 NOTE — Progress Notes (Signed)
 PHARMACY - ANTICOAGULATION CONSULT NOTE  Pharmacy Consult for heparin  Indication: chest pain/ACS  Allergies  Allergen Reactions   Sulfa Antibiotics Hives and Rash   Bee Pollen Hives   Hydrochlorothiazide      Chest tightness   Lisinopril Nausea Only   Losartan     Penicillins Hives   Tiotropium Bromide     Other reaction(s): Cough   Carvedilol     Patient Measurements: Height: 5' 10 (177.8 cm) Weight: 77.1 kg (170 lb) IBW/kg (Calculated) : 68.5 HEPARIN  DW (KG): 77.1  Vital Signs: Temp: 98.9 F (37.2 C) (10/24 0538) BP: 148/59 (10/24 0538) Pulse Rate: 55 (10/24 0538)  Labs: Recent Labs    07/24/24 0426 07/24/24 1231 07/25/24 0242 07/25/24 0838 07/26/24 0629  HGB 11.5*  --  10.8*  --  10.7*  HCT 37.7  --  35.6*  --  34.6*  PLT 246  --  229  --  204  HEPARINUNFRC 0.23*   < > 0.59 0.47 0.51  CREATININE 0.85  --  0.82  --   --    < > = values in this interval not displayed.    Estimated Creatinine Clearance: 75 mL/min (by C-G formula based on SCr of 0.82 mg/dL).   Medical History: Past Medical History:  Diagnosis Date   Aortic valve regurgitation    CHF (congestive heart failure) (HCC)    COPD (chronic obstructive pulmonary disease) (HCC)    Coronary artery disease    Depression    Hepatitis C    Hyperparathyroidism    Hypertension    Mitral valve regurgitation    Neuropathy    to R arm only   Non-toxic multinodular goiter    Pulmonary hypertension (HCC) 2022   noted on CT   Pulmonary nodule    Thyroid  disease     Medications:  Scheduled:   albuterol   2.5 mg Nebulization TID   aspirin   81 mg Oral Daily   celecoxib   200 mg Oral BID   doxycycline   100 mg Oral Q12H   escitalopram   10 mg Oral Daily   fluticasone  furoate-vilanterol  1 puff Inhalation Daily   isosorbide-hydrALAZINE   1 tablet Oral TID   metoprolol  succinate  12.5 mg Oral Daily   predniSONE   40 mg Oral Q breakfast   pregabalin   150 mg Oral BID   rosuvastatin   20 mg Oral Daily    spironolactone   25 mg Oral Daily    Assessment:  63 y/o female presenting with shortness of breath. PMH significant for COPD, HFpEF, CAD, anxiety/depression, hepatitis C, hyperparathyroidism, HTN, peripheral neuropathy. In ED, found to have elevated troponin levels. Pharmacy has been consulted to initiate heparin  infusion. Per chart review, patient is not on anticoagulation prior to admission.  1021 0011 HL 0.43, therapeutic x 1 1021 0628 HL 0.4 1022 0426 HL 0.23, SUBtherapeutic  1022 1231 HL 0.19  1022 2005 HL 0.72, SUPRAtherapeutic 1023 0242 HL 0.59, therapeutic X 1  1023 0838 HL 0.47  1024 0629 HL 0.51, therapeutic X 3    Goal of Therapy:  Heparin  level 0.3-0.7 units/ml Monitor platelets by anticoagulation protocol: Yes    Plan:  Heparin  level is therapeutic. Will continue heparin  infusion at 1150 unit/hr Recheck heparin  level and CBC with AM labs.   Thank you for involving pharmacy in this patient's care.   Silver Selinda BIRCH, PharmD Clinical Pharmacist 07/26/2024 7:02 AM

## 2024-07-26 NOTE — Progress Notes (Signed)
 [facility] Cardiology  CARDIOLOGY PROGRESS NOTE  Patient ID: Cindy Gutierrez MRN: [id number] DOB/AGE: 1960/10/07 63 y.o.  Admit date: 07/22/2024 Referring Physician Dr. Maree Reason for Consultation NSTEMI, respiratory failure  HPI: Patient seen post cath.  I think he has improved.  Review of systems complete and found to be negative unless listed above     Past Medical History:  Diagnosis Date   Aortic valve regurgitation    CHF (congestive heart failure) (HCC)    COPD (chronic obstructive pulmonary disease) (HCC)    Coronary artery disease    Depression    Hepatitis C    Hyperparathyroidism    Hypertension    Mitral valve regurgitation    Neuropathy    to R arm only   Non-toxic multinodular goiter    Pulmonary hypertension (HCC) 2022   noted on CT   Pulmonary nodule    Thyroid  disease     Past Surgical History:  Procedure Laterality Date   COLONOSCOPY WITH PROPOFOL  N/A 08/21/2018   Procedure: COLONOSCOPY WITH PROPOFOL ;  Surgeon: Unk Corinn Skiff, MD;  Location: [facility] ENDOSCOPY;  Service: Gastroenterology;  Laterality: N/A;   PARATHYROIDECTOMY Left    TUBAL LIGATION      Medications Prior to Admission  Medication Sig Dispense Refill Last Dose/Taking   acetaminophen  (TYLENOL ) 325 MG tablet Take 650 mg by mouth 3 (three) times daily as needed for mild pain (pain score 1-3).   Unknown   albuterol  (PROVENTIL ) (2.5 MG/3ML) 0.083% nebulizer solution Take 2.5 mg by nebulization every 4 (four) hours as needed for wheezing or shortness of breath.   Unknown   albuterol  (VENTOLIN  HFA) 108 (90 Base) MCG/ACT inhaler Inhale 2 puffs into the lungs every 6 (six) hours as needed for wheezing or shortness of breath. 8 g 2 Unknown   aspirin  81 MG chewable tablet Chew 81 mg by mouth daily.   Past Week   budesonide -formoterol  (SYMBICORT ) 160-4.5 MCG/ACT inhaler Inhale 2 puffs into the lungs 2 (two) times daily.   07/21/2024   calcium  carbonate (TUMS - DOSED IN MG ELEMENTAL CALCIUM ) 500 MG  chewable tablet Chew 1 tablet by mouth daily. As needed   Unknown   celecoxib  (CELEBREX ) 200 MG capsule Take 200 mg by mouth 2 (two) times daily.   07/21/2024   escitalopram  (LEXAPRO ) 10 MG tablet Take 10 mg by mouth daily.   Unknown   furosemide  (LASIX ) 20 MG tablet Take 1 tablet (20 mg total) by mouth daily. 30 tablet 0 07/20/2024   pregabalin  (LYRICA ) 150 MG capsule Take 150 mg by mouth 2 (two) times daily.   07/21/2024   rosuvastatin  (CRESTOR ) 20 MG tablet Take 20 mg by mouth daily.   Past Week   spironolactone  (ALDACTONE ) 25 MG tablet Take 1 tablet (25 mg total) by mouth daily. 30 tablet 0 Past Week   amLODipine  (NORVASC ) 10 MG tablet Take 1 tablet (10 mg total) by mouth daily. (Patient not taking: Reported on 07/22/2024) 30 tablet 1 Not Taking   dapagliflozin  propanediol (FARXIGA ) 10 MG TABS tablet Take 1 tablet (10 mg total) by mouth daily. (Patient not taking: Reported on 07/22/2024) 30 tablet 0 Not Taking   predniSONE  (DELTASONE ) 10 MG tablet 4 tabs po daily for two days (Patient not taking: Reported on 07/22/2024) 8 tablet 0 Not Taking   Social History   Socioeconomic History   Marital status: Single    Spouse name: Not on file   Number of children: Not on file   Years of education: Not  on file   Highest education level: Not on file  Occupational History   Not on file  Tobacco Use   Smoking status: Former    Types: Cigarettes    Start date: 2023    Quit date: 12/04/1993    Years since quitting: 30.6   Smokeless tobacco: Never  Vaping Use   Vaping status: Never Used  Substance and Sexual Activity   Alcohol use: Never   Drug use: Never   Sexual activity: Not Currently  Other Topics Concern   Not on file  Social History Narrative   ** Merged History Encounter **       Social Drivers of Health   Financial Resource Strain: High Risk (04/25/2024)   Received from [facility]   Overall Financial Resource Strain (CARDIA)    Difficulty of Paying Living  Expenses: Hard  Food Insecurity: No Food Insecurity (07/22/2024)   Hunger Vital Sign    Worried About Running Out of Food in the Last Year: Never true    Ran Out of Food in the Last Year: Never true  Transportation Needs: No Transportation Needs (07/22/2024)   PRAPARE - Administrator, Civil Service (Medical): No    Lack of Transportation (Non-Medical): No  Physical Activity: Not on file  Stress: Not on file  Social Connections: Moderately Isolated (07/22/2024)   Social Connection and Isolation Panel    Frequency of Communication with Friends and Family: More than three times a week    Frequency of Social Gatherings with Friends and Family: More than three times a week    Attends Religious Services: More than 4 times per year    Active Member of [employer] or Organizations: No    Attends Banker Meetings: Never    Marital Status: Never married  Intimate Partner Violence: Not At Risk (07/22/2024)   Humiliation, Afraid, Rape, and Kick questionnaire    Fear of Current or Ex-Partner: No    Emotionally Abused: No    Physically Abused: No    Sexually Abused: No    Family History  Problem Relation Age of Onset   Alzheimer's disease Mother    Heart disease Father       Review of systems complete and found to be negative unless listed above      PHYSICAL EXAM  Alert and oriented x 3 No pedal edema No wheeze or crackles No significant murmur  Labs:   Lab Results  Component Value Date   WBC 9.3 07/26/2024   HGB 10.7 (L) 07/26/2024   HCT 34.6 (L) 07/26/2024   MCV 90.1 07/26/2024   PLT 204 07/26/2024    Recent Labs  Lab 07/26/24 0629  NA 142  K 4.2  CL 105  CO2 30  BUN 22  CREATININE 0.68  CALCIUM  8.3*  PROT 6.2*  BILITOT 0.4  ALKPHOS 53  ALT 11  AST 15  GLUCOSE 87   Lab Results  Component Value Date   TROPONINI <0.03 12/18/2017   No results found for: CHOL No results found for: HDL No results found for: LDLCALC No results  found for: TRIG No results found for: CHOLHDL No results found for: LDLDIRECT    Radiology: DG Chest 1 View Result Date: 07/23/2024 EXAM: 1 VIEW(S) XRAY OF THE CHEST 07/23/2024 05:04:26 AM COMPARISON: 07/22/2024 CLINICAL HISTORY: CHF (congestive heart failure) (HCC) [address]06. CHF. FINDINGS: LUNGS AND PLEURA: Chronic hyperinflation. Bilateral chronic coarsened interstitial markings. No focal pulmonary opacity. No pulmonary edema. No pleural  effusion. No pneumothorax. HEART AND MEDIASTINUM: Surgical clips at thoracic inlet. No acute abnormality of the cardiac and mediastinal silhouettes. BONES AND SOFT TISSUES: Old left rib fractures. No acute osseous abnormality. IMPRESSION: 1. No acute findings. 2. Chronic hyperinflation. 3. Bilateral chronic coarsened interstitial markings. Electronically signed by: Donnice Mania MD 07/23/2024 08:51 AM EDT RP Workstation: [id number]   ECHOCARDIOGRAM COMPLETE Result Date: 07/22/2024    ECHOCARDIOGRAM REPORT   Patient Name:   Cindy Gutierrez [facility] Date of Exam: 07/22/2024 Medical Rec #:  [id number]     Height:       70.0 in Accession #:    [id number]    Weight:       170.0 lb Date of Birth:  September 29, 1961     BSA:          1.948 m Patient Age:    63 years      BP:           139/83 mmHg Patient Gender: F             HR:           80 bpm. Exam Location:  [facility] Procedure: 2D Echo, Cardiac Doppler and Color Doppler (Both Spectral and Color            Flow Doppler were utilized during procedure). Indications:     Elevated troponin  History:         Patient has prior history of Echocardiogram examinations, most                  recent 10/17/2023. CHF, COPD and Pulmonary HTN; Risk                  Factors:Hypertension.  Sonographer:     Christopher Furnace Referring Phys:  [id number] CORT ONEIDA MANA Diagnosing Phys: Keller Rylen Swindler IMPRESSIONS  1. Left ventricular ejection fraction, by estimation, is 40%. The left ventricle has moderately decreased function. The left ventricle demonstrates regional wall  motion abnormalities (see scoring diagram/findings for description). There is moderate left  ventricular hypertrophy. Left ventricular diastolic parameters are consistent with Grade I diastolic dysfunction (impaired relaxation).  2. Right ventricular systolic function is normal. The right ventricular size is normal.  3. The mitral valve is normal in structure. Mild mitral valve regurgitation.  4. The aortic valve was not well visualized. Aortic valve regurgitation is mild.  5. The inferior vena cava is normal in size with greater than 50% respiratory variability, suggesting right atrial pressure of 3 mmHg. FINDINGS  Left Ventricle: Left ventricular ejection fraction, by estimation, is 40%. The left ventricle has moderately decreased function. The left ventricle demonstrates regional wall motion abnormalities. The left ventricular internal cavity size was normal in size. There is moderate left ventricular hypertrophy. Left ventricular diastolic parameters are consistent with Grade I diastolic dysfunction (impaired relaxation).  LV Wall Scoring: The mid anteroseptal segment, mid inferolateral segment, mid anterolateral segment, mid inferoseptal segment, mid anterior segment, and mid inferior segment are hypokinetic. The entire apex, basal anteroseptal segment, basal inferolateral segment, basal anterolateral segment, basal anterior segment, basal inferior segment, and basal inferoseptal segment are normal. Right Ventricle: The right ventricular size is normal. No increase in right ventricular wall thickness. Right ventricular systolic function is normal. Left Atrium: Left atrial size was normal in size. Right Atrium: Right atrial size was normal in size. Pericardium: There is no evidence of pericardial effusion. Mitral Valve: The mitral valve is normal in structure. Mild mitral valve regurgitation. MV peak gradient, 10.0  mmHg. The mean mitral valve gradient is 4.0 mmHg. Tricuspid Valve: The tricuspid valve is normal  in structure. Tricuspid valve regurgitation is mild. Aortic Valve: The aortic valve was not well visualized. Aortic valve regurgitation is mild. Aortic regurgitation PHT measures 445 msec. Aortic valve mean gradient measures 4.3 mmHg. Aortic valve peak gradient measures 8.1 mmHg. Aortic valve area, by VTI measures 3.09 cm. Pulmonic Valve: The pulmonic valve was not well visualized. Pulmonic valve regurgitation is not visualized. Aorta: The aortic root is normal in size and structure. Venous: The inferior vena cava is normal in size with greater than 50% respiratory variability, suggesting right atrial pressure of 3 mmHg. IAS/Shunts: The atrial septum is grossly normal.  LEFT VENTRICLE PLAX 2D LVIDd:         4.70 cm   Diastology LVIDs:         3.10 cm   LV e' medial:    8.38 cm/s LV PW:         1.40 cm   LV E/e' medial:  9.9 LV IVS:        0.90 cm   LV e' lateral:   9.79 cm/s LVOT diam:     2.30 cm   LV E/e' lateral: 8.5 LV SV:         85 LV SV Index:   44 LVOT Area:     4.15 cm  RIGHT VENTRICLE RV Basal diam:  4.90 cm RV Mid diam:    3.80 cm RV S prime:     11.90 cm/s TAPSE (M-mode): 3.3 cm LEFT ATRIUM             Index        RIGHT ATRIUM           Index LA diam:        2.20 cm 1.13 cm/m   RA Area:     14.10 cm LA Vol (A2C):   45.7 ml 23.46 ml/m  RA Volume:   33.20 ml  17.04 ml/m LA Vol (A4C):   35.6 ml 18.28 ml/m LA Biplane Vol: 41.3 ml 21.20 ml/m  AORTIC VALVE AV Area (Vmax):    3.09 cm AV Area (Vmean):   2.97 cm AV Area (VTI):     3.09 cm AV Vmax:           142.00 cm/s AV Vmean:          98.833 cm/s AV VTI:            0.274 m AV Peak Grad:      8.1 mmHg AV Mean Grad:      4.3 mmHg LVOT Vmax:         105.50 cm/s LVOT Vmean:        70.750 cm/s LVOT VTI:          0.204 m LVOT/AV VTI ratio: 0.74 AI PHT:            445 msec  AORTA Ao Root diam: 2.80 cm MITRAL VALVE                TRICUSPID VALVE MV Area (PHT): 3.89 cm     TR Peak grad:   23.2 mmHg MV Area VTI:   3.22 cm     TR Vmax:        241.00 cm/s MV  Peak grad:  10.0 mmHg MV Mean grad:  4.0 mmHg     SHUNTS MV Vmax:       1.58 m/s  Systemic VTI:  0.20 m MV Vmean:      86.7 cm/s    Systemic Diam: 2.30 cm MV Decel Time: 195 msec MV E velocity: 82.80 cm/s MV A velocity: 119.00 cm/s MV E/A ratio:  0.70 Keller Paterson Electronically signed by Keller Paterson Signature Date/Time: 07/22/2024/12:26:42 PM    Final    DG Chest Portable 1 View Result Date: 07/22/2024 EXAM: 1 VIEW(S) XRAY OF THE CHEST 07/22/2024 05:52:00 AM COMPARISON: 04/14/2024 CLINICAL HISTORY: evalutae for pleural edema. Pt to ED via [facility] with c/o resp. Distress. Per EMS pt has hx of COPD, received 2 duonebs and 1.5in nitroglycerin  paste en route, however paste removed due to pt's BP dropping. Per EMS pt refused CPAP and IV access en route. ; Reason for exam: evalutae for pleural edema FINDINGS: LUNGS AND PLEURA: Hyperinflated lungs. Chronic interstitial coarsening. No focal pulmonary opacity. No pulmonary edema. No pleural effusion. No pneumothorax. HEART AND MEDIASTINUM: Surgical clips at thoracic inlet. No acute abnormality of the cardiac and mediastinal silhouettes. BONES AND SOFT TISSUES: Remote left rib fractures. No acute osseous abnormality. IMPRESSION: 1. No acute findings. 2. Hyperinflated lungs and chronic interstitial coarsening, consistent with history of COPD. Electronically signed by: Evalene Coho MD 07/22/2024 06:25 AM EDT RP Workstation: [id number]    ASSESSMENT AND PLAN:  Acute hypoxic respiratory failure Acute heart failure with reduced EF, LVEF 40 point COPD exacerbation Coronary artery disease, hypertension  Left heart catheterization today showed no CAD. 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 Lasix  p.o. 40 mg daily Continue spironolactone , BiDil, Toprol -XL.  Other GDMT not tolerated before. COPD management per primary team  Signed: Keller JAYSON Paterson MD 07/26/2024, 7:46 AM

## 2024-07-26 NOTE — Progress Notes (Signed)
 Tele monitoring order d/c by provider.

## 2024-07-26 NOTE — Plan of Care (Signed)

## 2024-07-26 NOTE — Plan of Care (Signed)
  Problem: Education: Goal: Knowledge of General Education information will improve Description: Including pain rating scale, medication(s)/side effects and non-pharmacologic comfort measures Outcome: Progressing   Problem: Health Behavior/Discharge Planning: Goal: Ability to manage health-related needs will improve Outcome: Progressing   Problem: Clinical Measurements: Goal: Ability to maintain clinical measurements within normal limits will improve Outcome: Progressing Goal: Will remain free from infection Outcome: Progressing Goal: Diagnostic test results will improve Outcome: Progressing Goal: Respiratory complications will improve Outcome: Progressing Goal: Cardiovascular complication will be avoided Outcome: Progressing   Problem: Activity: Goal: Risk for activity intolerance will decrease Outcome: Progressing   Problem: Nutrition: Goal: Adequate nutrition will be maintained Outcome: Progressing   Problem: Coping: Goal: Level of anxiety will decrease Outcome: Progressing   Problem: Elimination: Goal: Will not experience complications related to bowel motility Outcome: Progressing Goal: Will not experience complications related to urinary retention Outcome: Progressing   Problem: Pain Managment: Goal: General experience of comfort will improve and/or be controlled Outcome: Progressing   Problem: Safety: Goal: Ability to remain free from injury will improve Outcome: Progressing   Problem: Skin Integrity: Goal: Risk for impaired skin integrity will decrease Outcome: Progressing   Problem: Education: Goal: Knowledge of disease or condition will improve Outcome: Progressing Goal: Knowledge of the prescribed therapeutic regimen will improve Outcome: Progressing Goal: Individualized Educational Video(s) Outcome: Progressing   Problem: Activity: Goal: Ability to tolerate increased activity will improve Outcome: Progressing Goal: Will verbalize the  importance of balancing activity with adequate rest periods Outcome: Progressing   Problem: Respiratory: Goal: Ability to maintain a clear airway will improve Outcome: Progressing Goal: Levels of oxygenation will improve Outcome: Progressing Goal: Ability to maintain adequate ventilation will improve Outcome: Progressing   Problem: Education: Goal: Understanding of CV disease, CV risk reduction, and recovery process will improve Outcome: Progressing Goal: Individualized Educational Video(s) Outcome: Progressing   Problem: Activity: Goal: Ability to return to baseline activity level will improve Outcome: Progressing   Problem: Cardiovascular: Goal: Ability to achieve and maintain adequate cardiovascular perfusion will improve Outcome: Progressing Goal: Vascular access site(s) Level 0-1 will be maintained Outcome: Progressing   Problem: Health Behavior/Discharge Planning: Goal: Ability to safely manage health-related needs after discharge will improve Outcome: Progressing

## 2024-07-27 ENCOUNTER — Other Ambulatory Visit: Payer: Self-pay

## 2024-07-27 DIAGNOSIS — J441 Chronic obstructive pulmonary disease with (acute) exacerbation: Secondary | ICD-10-CM | POA: Diagnosis not present

## 2024-07-27 DIAGNOSIS — I5181 Takotsubo syndrome: Secondary | ICD-10-CM | POA: Insufficient documentation

## 2024-07-27 DIAGNOSIS — I5020 Unspecified systolic (congestive) heart failure: Secondary | ICD-10-CM | POA: Insufficient documentation

## 2024-07-27 LAB — CBC WITH DIFFERENTIAL/PLATELET
Abs Immature Granulocytes: 0.04 K/uL (ref 0.00–0.07)
Basophils Absolute: 0 K/uL (ref 0.0–0.1)
Basophils Relative: 0 %
Eosinophils Absolute: 0 K/uL (ref 0.0–0.5)
Eosinophils Relative: 0 %
HCT: 32.7 % — ABNORMAL LOW (ref 36.0–46.0)
Hemoglobin: 10.3 g/dL — ABNORMAL LOW (ref 12.0–15.0)
Immature Granulocytes: 1 %
Lymphocytes Relative: 15 %
Lymphs Abs: 1.2 K/uL (ref 0.7–4.0)
MCH: 28.4 pg (ref 26.0–34.0)
MCHC: 31.5 g/dL (ref 30.0–36.0)
MCV: 90.1 fL (ref 80.0–100.0)
Monocytes Absolute: 0.7 K/uL (ref 0.1–1.0)
Monocytes Relative: 9 %
Neutro Abs: 6 K/uL (ref 1.7–7.7)
Neutrophils Relative %: 75 %
Platelets: 154 K/uL (ref 150–400)
RBC: 3.63 MIL/uL — ABNORMAL LOW (ref 3.87–5.11)
RDW: 14 % (ref 11.5–15.5)
WBC: 7.9 K/uL (ref 4.0–10.5)
nRBC: 0 % (ref 0.0–0.2)

## 2024-07-27 LAB — CULTURE, BLOOD (ROUTINE X 2)
Culture: NO GROWTH
Culture: NO GROWTH
Special Requests: ADEQUATE

## 2024-07-27 LAB — HEPARIN LEVEL (UNFRACTIONATED): Heparin Unfractionated: <0.1 [IU]/mL — ABNORMAL LOW (ref 0.30–0.70)

## 2024-07-27 LAB — COMPREHENSIVE METABOLIC PANEL WITH GFR
ALT: 9 U/L (ref 0–44)
AST: 11 U/L — ABNORMAL LOW (ref 15–41)
Albumin: 2.9 g/dL — ABNORMAL LOW (ref 3.5–5.0)
Alkaline Phosphatase: 53 U/L (ref 38–126)
Anion gap: 9 (ref 5–15)
BUN: 29 mg/dL — ABNORMAL HIGH (ref 8–23)
CO2: 30 mmol/L (ref 22–32)
Calcium: 8.4 mg/dL — ABNORMAL LOW (ref 8.9–10.3)
Chloride: 103 mmol/L (ref 98–111)
Creatinine, Ser: 0.84 mg/dL (ref 0.44–1.00)
GFR, Estimated: >60 mL/min (ref >60)
Glucose, Bld: 104 mg/dL — ABNORMAL HIGH (ref 70–99)
Potassium: 4.3 mmol/L (ref 3.5–5.1)
Sodium: 142 mmol/L (ref 135–145)
Total Bilirubin: 0.7 mg/dL (ref 0.0–1.2)
Total Protein: 5.5 g/dL — ABNORMAL LOW (ref 6.5–8.1)

## 2024-07-27 LAB — PHOSPHORUS: Phosphorus: 3.7 mg/dL (ref 2.5–4.6)

## 2024-07-27 LAB — MAGNESIUM: Magnesium: 2.5 mg/dL — ABNORMAL HIGH (ref 1.7–2.4)

## 2024-07-27 MED ORDER — PREGABALIN 150 MG PO CAPS: 150.0000 mg | ORAL_CAPSULE | Freq: Two times a day (BID) | ORAL | 0 refills | Status: AC

## 2024-07-27 MED ORDER — ISOSORB DINITRATE-HYDRALAZINE 20-37.5 MG PO TABS: 1.0000 | ORAL_TABLET | Freq: Three times a day (TID) | ORAL | 1 refills | Status: DC

## 2024-07-27 MED ORDER — METOPROLOL SUCCINATE ER 25 MG PO TB24: 12.5000 mg | ORAL_TABLET | Freq: Every day | ORAL | 1 refills | Status: DC

## 2024-07-27 MED ORDER — METOPROLOL SUCCINATE ER 25 MG PO TB24: 12.5000 mg | ORAL_TABLET | Freq: Every day | ORAL | 1 refills | Status: AC

## 2024-07-27 MED ORDER — PREGABALIN 150 MG PO CAPS: 150.0000 mg | ORAL_CAPSULE | Freq: Two times a day (BID) | ORAL | 0 refills | Status: DC

## 2024-07-27 MED ORDER — ALBUTEROL SULFATE (2.5 MG/3ML) 0.083% IN NEBU: 2.5000 mg | INHALATION_SOLUTION | Freq: Two times a day (BID) | RESPIRATORY_TRACT | Status: DC

## 2024-07-27 NOTE — Plan of Care (Signed)
  Problem: Education: Goal: Knowledge of General Education information will improve Description: Including pain rating scale, medication(s)/side effects and non-pharmacologic comfort measures Outcome: Progressing   Problem: Health Behavior/Discharge Planning: Goal: Ability to manage health-related needs will improve Outcome: Progressing   Problem: Clinical Measurements: Goal: Ability to maintain clinical measurements within normal limits will improve Outcome: Progressing Goal: Will remain free from infection Outcome: Progressing Goal: Diagnostic test results will improve Outcome: Progressing Goal: Respiratory complications will improve Outcome: Progressing Goal: Cardiovascular complication will be avoided Outcome: Progressing   Problem: Activity: Goal: Risk for activity intolerance will decrease Outcome: Progressing   Problem: Nutrition: Goal: Adequate nutrition will be maintained Outcome: Progressing   Problem: Coping: Goal: Level of anxiety will decrease Outcome: Progressing   Problem: Elimination: Goal: Will not experience complications related to bowel motility Outcome: Progressing Goal: Will not experience complications related to urinary retention Outcome: Progressing   Problem: Pain Managment: Goal: General experience of comfort will improve and/or be controlled Outcome: Progressing   Problem: Safety: Goal: Ability to remain free from injury will improve Outcome: Progressing   Problem: Skin Integrity: Goal: Risk for impaired skin integrity will decrease Outcome: Progressing   Problem: Education: Goal: Knowledge of disease or condition will improve Outcome: Progressing Goal: Knowledge of the prescribed therapeutic regimen will improve Outcome: Progressing Goal: Individualized Educational Video(s) Outcome: Progressing   Problem: Activity: Goal: Ability to tolerate increased activity will improve Outcome: Progressing Goal: Will verbalize the  importance of balancing activity with adequate rest periods Outcome: Progressing   Problem: Respiratory: Goal: Ability to maintain a clear airway will improve Outcome: Progressing Goal: Levels of oxygenation will improve Outcome: Progressing Goal: Ability to maintain adequate ventilation will improve Outcome: Progressing   Problem: Education: Goal: Understanding of CV disease, CV risk reduction, and recovery process will improve Outcome: Progressing Goal: Individualized Educational Video(s) Outcome: Progressing   Problem: Activity: Goal: Ability to return to baseline activity level will improve Outcome: Progressing   Problem: Cardiovascular: Goal: Ability to achieve and maintain adequate cardiovascular perfusion will improve Outcome: Progressing Goal: Vascular access site(s) Level 0-1 will be maintained Outcome: Progressing   Problem: Health Behavior/Discharge Planning: Goal: Ability to safely manage health-related needs after discharge will improve Outcome: Progressing

## 2024-07-27 NOTE — Discharge Summary (Signed)
 Cindy Gutierrez FMW:[id number] DOB: Nov 08, 1960 DOA: 07/22/2024  [facility]  Admit date: 07/22/2024 Discharge date: 07/27/2024  Time spent: 35 minutes  Recommendations for Outpatient Follow-up:  Pcp f/u Cardiology f/u     Discharge Diagnoses:  Principal Problem:   COPD (chronic obstructive pulmonary disease) (HCC) Active Problems:   Acute hypoxic respiratory failure (HCC)   COPD with acute exacerbation (HCC)   Discharge Condition: improved  Diet recommendation: heart healthy  Filed Weights   07/22/24 0524  Weight: 77.1 kg    History of present illness:  From admission h and p Cindy Gutierrez is a 63 y.o. female with medical history significant of COPD, HFpEF, CAD, anxiety/depression, hepatitis C, hyperparathyroidism, HTN, peripheral neuropathy, mitral valve regurgitation, pulmonary hypertension presented with worsening of cough wheezing and shortness of breath.   Symptoms started 2 days ago, patient started develop with dry cough and wheezing and started to feel  very tight in the chest and cannot breathe in, but denied any chest pain no fever or chills.  Overnight, could not sleep because of shortness of breath and called EMS.  EMS arrived and found patient in severe respiratory distress and put patient on 6 L, patient declined CPAP  Hospital Course:   Patient presents with acute on chronic hypoxic respiratory failure. Primary etiology of this is copd exacerbation and this was treated with course of steroids and antibiotics. Now resolved, breathing comfortably on home 2 liters nasal cannula. Also found to have new hfref with EF of 40% on TTE. 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 GDMT with metoprolol , bidil instituted, home spironolactone  continued, further gdmt limited by relative hypotension. Other chronic medical problems stable. Evaluated by PT, no home health needs identified. Discharged  with above meds, advise close pcp and cardiology f/u.   Procedures: LHC   Consultations: cardiology  Discharge Exam: Vitals:   07/27/24 0854 07/27/24 1233  BP: (!) 139/52 (!) 118/40  Pulse: (!) 56 (!) 58  Resp: 17 17  Temp: 98.4 F (36.9 C) 99.3 F (37.4 C)  SpO2: 98% 98%    General: NAD Cardiovascular: RR Respiratory: CTAB Ext: warm, no edema  Discharge Instructions   Discharge Instructions     AMB Referral to Cardiac Rehabilitation - Phase II   Complete by: As directed    Diagnosis: Heart Failure (see criteria below if ordering Phase II)   Heart Failure Type: Chronic Systolic   After initial evaluation and assessments completed: Virtual Based Care may be provided alone or in conjunction with Phase 2 Cardiac Rehab based on patient barriers.: Yes   Intensive Cardiac Rehabilitation (ICR) [facility] location only OR Traditional Cardiac Rehabilitation (TCR) *If criteria for ICR are not met will enroll in TCR [facility] only): Yes   Diet - low sodium heart healthy   Complete by: As directed    Increase activity slowly   Complete by: As directed       Allergies as of 07/27/2024       Reactions   Sulfa Antibiotics Hives, Rash   Bee Pollen Hives   Hydrochlorothiazide     Chest tightness   Lisinopril Nausea Only   Losartan     Penicillins Hives   Tiotropium Bromide    Other reaction(s): Cough   Carvedilol         Medication List     STOP taking these medications    amLODipine  10 MG tablet Commonly known as: NORVASC    dapagliflozin   propanediol 10 MG Tabs tablet Commonly known as: FARXIGA    predniSONE  10 MG tablet Commonly known as: DELTASONE        TAKE these medications    acetaminophen  325 MG tablet Commonly known as: TYLENOL  Take 650 mg by mouth 3 (three) times daily as needed for mild pain (pain score 1-3).   albuterol  (2.5 MG/3ML) 0.083% nebulizer solution Commonly known as: PROVENTIL  Take 2.5 mg by nebulization every 4 (four) hours as needed for  wheezing or shortness of breath.   albuterol  108 (90 Base) MCG/ACT inhaler Commonly known as: VENTOLIN  HFA Inhale 2 puffs into the lungs every 6 (six) hours as needed for wheezing or shortness of breath.   aspirin  81 MG chewable tablet Chew 81 mg by mouth daily.   budesonide -formoterol  160-4.5 MCG/ACT inhaler Commonly known as: SYMBICORT  Inhale 2 puffs into the lungs 2 (two) times daily.   calcium  carbonate 500 MG chewable tablet Commonly known as: TUMS - dosed in mg elemental calcium  Chew 1 tablet by mouth daily. As needed   celecoxib  200 MG capsule Commonly known as: CELEBREX  Take 200 mg by mouth 2 (two) times daily.   escitalopram  10 MG tablet Commonly known as: LEXAPRO  Take 10 mg by mouth daily.   furosemide  20 MG tablet Commonly known as: LASIX  Take 1 tablet (20 mg total) by mouth daily.   isosorbide-hydrALAZINE  20-37.5 MG tablet Commonly known as: BIDIL Take 1 tablet by mouth 3 (three) times daily.   metoprolol  succinate 25 MG 24 hr tablet Commonly known as: TOPROL -XL Take 0.5 tablets (12.5 mg total) by mouth daily. Start taking on: July 28, 2024   pregabalin  150 MG capsule Commonly known as: LYRICA  Take 1 capsule (150 mg total) by mouth 2 (two) times daily.   rosuvastatin  20 MG tablet Commonly known as: CRESTOR  Take 20 mg by mouth daily.   spironolactone  25 MG tablet Commonly known as: ALDACTONE  Take 1 tablet (25 mg total) by mouth daily.       Allergies  Allergen Reactions   Sulfa Antibiotics Hives and Rash   Bee Pollen Hives   Hydrochlorothiazide      Chest tightness   Lisinopril Nausea Only   Losartan     Penicillins Hives   Tiotropium Bromide     Other reaction(s): Cough   Carvedilol     Follow-up Information     Callwood, Dwayne D, MD. Go in 1 week(s).   Specialties: Cardiology, Internal Medicine Contact information: [address] [place] [address] [phone]         [facility] Follow [facility].   Contact  information: [address] [place] [address] [phone]                  The results of significant diagnostics from this hospitalization (including imaging, microbiology, ancillary and laboratory) are listed below for reference.    Significant Diagnostic Studies: CARDIAC CATHETERIZATION Result Date: 07/26/2024   There is mild left ventricular systolic dysfunction.   LV end diastolic pressure is mildly elevated.   The left ventricular ejection fraction is 35-45% by visual estimate.   No indication for antiplatelet therapy at this time . Conclusion Inpatient left heart cath right radial approach Left ventriculogram Mildly depressed left ventricular function globally EF around 40% Coronaries Left main large free of disease LAD large free of disease Circumflex large free of disease RCA large free of disease Right dominant system Intervention deferred not indicated Patient tolerated procedure well No complications   DG Chest 1 View Result Date:  07/23/2024 EXAM: 1 VIEW(S) XRAY OF THE CHEST 07/23/2024 05:04:26 AM COMPARISON: 07/22/2024 CLINICAL HISTORY: CHF (congestive heart failure) (HCC) [address]2706. CHF. FINDINGS: LUNGS AND PLEURA: Chronic hyperinflation. Bilateral chronic coarsened interstitial markings. No focal pulmonary opacity. No pulmonary edema. No pleural effusion. No pneumothorax. HEART AND MEDIASTINUM: Surgical clips at thoracic inlet. No acute abnormality of the cardiac and mediastinal silhouettes. BONES AND SOFT TISSUES: Old left rib fractures. No acute osseous abnormality. IMPRESSION: 1. No acute findings. 2. Chronic hyperinflation. 3. Bilateral chronic coarsened interstitial markings. Electronically signed by: Donnice Mania MD 07/23/2024 08:51 AM EDT RP Workstation: [id number]   ECHOCARDIOGRAM COMPLETE Result Date: 07/22/2024    ECHOCARDIOGRAM REPORT   Patient Name:   Cindy Gutierrez [facility] Date of Exam: 07/22/2024 Medical Rec #:  [id number]     Height:       70.0 in Accession #:     [id number]    Weight:       170.0 lb Date of Birth:  [birth year]     BSA:          1.948 m Patient Age:    63 years      BP:           139/83 mmHg Patient Gender: F             HR:           80 bpm. Exam Location:  [facility] Procedure: 2D Echo, Cardiac Doppler and Color Doppler (Both Spectral and Color            Flow Doppler were utilized during procedure). Indications:     Elevated troponin  History:         Patient has prior history of Echocardiogram examinations, most                  recent 10/17/2023. CHF, COPD and Pulmonary HTN; Risk                  Factors:Hypertension.  Sonographer:     Christopher Furnace Referring Phys:  [id number] CORT ONEIDA MANA Diagnosing Phys: Keller Alluri IMPRESSIONS  1. Left ventricular ejection fraction, by estimation, is 40%. The left ventricle has moderately decreased function. The left ventricle demonstrates regional wall motion abnormalities (see scoring diagram/findings for description). There is moderate left  ventricular hypertrophy. Left ventricular diastolic parameters are consistent with Grade I diastolic dysfunction (impaired relaxation).  2. Right ventricular systolic function is normal. The right ventricular size is normal.  3. The mitral valve is normal in structure. Mild mitral valve regurgitation.  4. The aortic valve was not well visualized. Aortic valve regurgitation is mild.  5. The inferior vena cava is normal in size with greater than 50% respiratory variability, suggesting right atrial pressure of 3 mmHg. FINDINGS  Left Ventricle: Left ventricular ejection fraction, by estimation, is 40%. The left ventricle has moderately decreased function. The left ventricle demonstrates regional wall motion abnormalities. The left ventricular internal cavity size was normal in size. There is moderate left ventricular hypertrophy. Left ventricular diastolic parameters are consistent with Grade I diastolic dysfunction (impaired relaxation).  LV Wall Scoring: The mid anteroseptal segment, mid  inferolateral segment, mid anterolateral segment, mid inferoseptal segment, mid anterior segment, and mid inferior segment are hypokinetic. The entire apex, basal anteroseptal segment, basal inferolateral segment, basal anterolateral segment, basal anterior segment, basal inferior segment, and basal inferoseptal segment are normal. Right Ventricle: The right ventricular size is normal. No increase in right ventricular wall thickness. Right ventricular systolic function is normal.  Left Atrium: Left atrial size was normal in size. Right Atrium: Right atrial size was normal in size. Pericardium: There is no evidence of pericardial effusion. Mitral Valve: The mitral valve is normal in structure. Mild mitral valve regurgitation. MV peak gradient, 10.0 mmHg. The mean mitral valve gradient is 4.0 mmHg. Tricuspid Valve: The tricuspid valve is normal in structure. Tricuspid valve regurgitation is mild. Aortic Valve: The aortic valve was not well visualized. Aortic valve regurgitation is mild. Aortic regurgitation PHT measures 445 msec. Aortic valve mean gradient measures 4.3 mmHg. Aortic valve peak gradient measures 8.1 mmHg. Aortic valve area, by VTI measures 3.09 cm. Pulmonic Valve: The pulmonic valve was not well visualized. Pulmonic valve regurgitation is not visualized. Aorta: The aortic root is normal in size and structure. Venous: The inferior vena cava is normal in size with greater than 50% respiratory variability, suggesting right atrial pressure of 3 mmHg. IAS/Shunts: The atrial septum is grossly normal.  LEFT VENTRICLE PLAX 2D LVIDd:         4.70 cm   Diastology LVIDs:         3.10 cm   LV e' medial:    8.38 cm/s LV PW:         1.40 cm   LV E/e' medial:  9.9 LV IVS:        0.90 cm   LV e' lateral:   9.79 cm/s LVOT diam:     2.30 cm   LV E/e' lateral: 8.5 LV SV:         85 LV SV Index:   44 LVOT Area:     4.15 cm  RIGHT VENTRICLE RV Basal diam:  4.90 cm RV Mid diam:    3.80 cm RV S prime:     11.90 cm/s TAPSE  (M-mode): 3.3 cm LEFT ATRIUM             Index        RIGHT ATRIUM           Index LA diam:        2.20 cm 1.13 cm/m   RA Area:     14.10 cm LA Vol (A2C):   45.7 ml 23.46 ml/m  RA Volume:   33.20 ml  17.04 ml/m LA Vol (A4C):   35.6 ml 18.28 ml/m LA Biplane Vol: 41.3 ml 21.20 ml/m  AORTIC VALVE AV Area (Vmax):    3.09 cm AV Area (Vmean):   2.97 cm AV Area (VTI):     3.09 cm AV Vmax:           142.00 cm/s AV Vmean:          98.833 cm/s AV VTI:            0.274 m AV Peak Grad:      8.1 mmHg AV Mean Grad:      4.3 mmHg LVOT Vmax:         105.50 cm/s LVOT Vmean:        70.750 cm/s LVOT VTI:          0.204 m LVOT/AV VTI ratio: 0.74 AI PHT:            445 msec  AORTA Ao Root diam: 2.80 cm MITRAL VALVE                TRICUSPID VALVE MV Area (PHT): 3.89 cm     TR Peak grad:   23.2 mmHg MV Area VTI:   3.22 cm  TR Vmax:        241.00 cm/s MV Peak grad:  10.0 mmHg MV Mean grad:  4.0 mmHg     SHUNTS MV Vmax:       1.58 m/s     Systemic VTI:  0.20 m MV Vmean:      86.7 cm/s    Systemic Diam: 2.30 cm MV Decel Time: 195 msec MV E velocity: 82.80 cm/s MV A velocity: 119.00 cm/s MV E/A ratio:  0.70 Keller Paterson Electronically signed by Keller Paterson Signature Date/Time: 07/22/2024/12:26:42 PM    Final    DG Chest Portable 1 View Result Date: 07/22/2024 EXAM: 1 VIEW(S) XRAY OF THE CHEST 07/22/2024 05:52:00 AM COMPARISON: 04/14/2024 CLINICAL HISTORY: evalutae for pleural edema. Pt to ED via [facility] with c/o resp. Distress. Per EMS pt has hx of COPD, received 2 duonebs and 1.5in nitroglycerin  paste en route, however paste removed due to pt's BP dropping. Per EMS pt refused CPAP and IV access en route. ; Reason for exam: evalutae for pleural edema FINDINGS: LUNGS AND PLEURA: Hyperinflated lungs. Chronic interstitial coarsening. No focal pulmonary opacity. No pulmonary edema. No pleural effusion. No pneumothorax. HEART AND MEDIASTINUM: Surgical clips at thoracic inlet. No acute abnormality of the cardiac and mediastinal  silhouettes. BONES AND SOFT TISSUES: Remote left rib fractures. No acute osseous abnormality. IMPRESSION: 1. No acute findings. 2. Hyperinflated lungs and chronic interstitial coarsening, consistent with history of COPD. Electronically signed by: Evalene Coho MD 07/22/2024 06:25 AM EDT RP Workstation: [id number]    Microbiology: Recent Results (from the past 240 hours)  Resp panel by RT-PCR (RSV, Flu A&B, Covid) Anterior Nasal Swab     Status: None   Collection Time: 07/22/24  5:38 AM   Specimen: Anterior Nasal Swab  Result Value Ref Range Status   SARS Coronavirus 2 by RT PCR NEGATIVE NEGATIVE Final    Comment: (NOTE) SARS-CoV-2 target nucleic acids are NOT DETECTED.  The SARS-CoV-2 RNA is generally detectable in upper respiratory specimens during the acute phase of infection. The lowest concentration of SARS-CoV-2 viral copies this assay can detect is 138 copies/mL. A negative result does not preclude SARS-Cov-2 infection and should not be used as the sole basis for treatment or other patient management decisions. A negative result may occur with  improper specimen collection/handling, submission of specimen other than nasopharyngeal swab, presence of viral mutation(s) within the areas targeted by this assay, and inadequate number of viral copies(<138 copies/mL). A negative result must be combined with clinical observations, patient history, and epidemiological information. The expected result is Negative.  Fact Sheet for Patients:  bloggercourse.com  Fact Sheet for Healthcare Providers:  seriousbroker.it  This test is no t yet approved or cleared by the [place]  FDA and  has been authorized for detection and/or diagnosis of SARS-CoV-2 by FDA under an Emergency Use Authorization (EUA). This EUA will remain  in effect (meaning this test can be used) for the duration of the COVID-19 declaration under Section 564(b)(1) of  the Act, 21 U.S.C.section 360bbb-3(b)(1), unless the authorization is terminated  or revoked sooner.       Influenza A by PCR NEGATIVE NEGATIVE Final   Influenza B by PCR NEGATIVE NEGATIVE Final    Comment: (NOTE) The Xpert Xpress SARS-CoV-2/FLU/RSV plus assay is intended as an aid in the diagnosis of influenza from Nasopharyngeal swab specimens and should not be used as a sole basis for treatment. Nasal washings and aspirates are unacceptable for Xpert Xpress SARS-CoV-2/FLU/RSV testing.  Fact Sheet  for Patients: bloggercourse.com  Fact Sheet for Healthcare Providers: seriousbroker.it  This test is not yet approved or cleared by the [place]  FDA and has been authorized for detection and/or diagnosis of SARS-CoV-2 by FDA under an Emergency Use Authorization (EUA). This EUA will remain in effect (meaning this test can be used) for the duration of the COVID-19 declaration under Section 564(b)(1) of the Act, 21 U.S.C. section 360bbb-3(b)(1), unless the authorization is terminated or revoked.     Resp Syncytial Virus by PCR NEGATIVE NEGATIVE Final    Comment: (NOTE) Fact Sheet for Patients: bloggercourse.com  Fact Sheet for Healthcare Providers: seriousbroker.it  This test is not yet approved or cleared by the [place]  FDA and has been authorized for detection and/or diagnosis of SARS-CoV-2 by FDA under an Emergency Use Authorization (EUA). This EUA will remain in effect (meaning this test can be used) for the duration of the COVID-19 declaration under Section 564(b)(1) of the Act, 21 U.S.C. section 360bbb-3(b)(1), unless the authorization is terminated or revoked.  Performed at [facility], [address] [place], [place] [address]   Blood culture (routine x 2)     Status: None   Collection Time: 07/22/24  5:38 AM   Specimen: BLOOD  Result Value Ref  Range Status   Specimen Description BLOOD BLOOD RIGHT WRIST  Final   Special Requests   Final    BOTTLES DRAWN AEROBIC AND ANAEROBIC Blood Culture adequate volume   Culture   Final    NO GROWTH 5 DAYS Performed at [facility], [address] [place], [place] [address]    Report Status 07/27/2024 FINAL  Final  Blood culture (routine x 2)     Status: None   Collection Time: 07/22/24  5:38 AM   Specimen: BLOOD  Result Value Ref Range Status   Specimen Description BLOOD BLOOD LEFT FOREARM  Final   Special Requests   Final    BOTTLES DRAWN AEROBIC AND ANAEROBIC Blood Culture results may not be optimal due to an inadequate volume of blood received in culture bottles   Culture   Final    NO GROWTH 5 DAYS Performed at [facility], [address] [place], [place] [address]    Report Status 07/27/2024 FINAL  Final     Labs: Basic Metabolic Panel: Recent Labs  Lab 07/23/24 0532 07/24/24 0426 07/25/24 0242 07/26/24 0629 07/27/24 0500  NA 144 146* 145 142 142  K 3.9 4.1 4.0 4.2 4.3  CL 106 103 107 105 103  CO2 22 31 29 30 30   GLUCOSE 174* 137* 116* 87 104*  BUN 16 24* 28* 22 29*  CREATININE 0.69 0.85 0.82 0.68 0.84  CALCIUM  8.6* 8.3* 8.1* 8.3* 8.4*  MG  --  1.8 2.6* 2.7* 2.5*  PHOS  --  4.2 3.5 3.4 3.7   Liver Function Tests: Recent Labs  Lab 07/22/24 0538 07/24/24 0426 07/25/24 0242 07/26/24 0629 07/27/24 0500  AST 16 20 15 15  11*  ALT 10 10 8 11 9   ALKPHOS 80 65 61 53 53  BILITOT 0.4 0.3 0.4 0.4 0.7  PROT 6.5 6.7 5.9* 6.2* 5.5*  ALBUMIN 3.7 3.7 3.2* 3.3* 2.9*   No results for input(s): LIPASE, AMYLASE in the last 168 hours. No results for input(s): AMMONIA in the last 168 hours. CBC: Recent Labs  Lab 07/22/24 0538 07/23/24 0532 07/24/24 0426 07/25/24 0242 07/26/24 0629 07/27/24 0500  WBC 9.4 8.1 12.1* 11.1* 9.3 7.9  NEUTROABS 6.2  --  10.8* 9.0* 6.4 6.0  HGB 12.0 11.5* 11.5* 10.8* 10.7* 10.3*  HCT 38.5 36.9 37.7 35.6* 34.6*  32.7*  MCV 90.0 87.9 90.4 91.3 90.1 90.1  PLT 235 206 246 229 204 154   Cardiac Enzymes: No results for input(s): CKTOTAL, CKMB, CKMBINDEX, TROPONINI in the last 168 hours. BNP: BNP (last 3 results) Recent Labs    04/14/24 2005 04/17/24 0421 07/22/24 0538  BNP 720.4* 651.2* 545.2*    ProBNP (last 3 results) No results for input(s): PROBNP in the last 8760 hours.  CBG: No results for input(s): GLUCAP in the last 168 hours.     Signed:  Devaughn KATHEE Ban MD.  [facility] 07/27/2024, 1:01 PM

## 2024-07-27 NOTE — Progress Notes (Signed)
 [facility] Cardiology    SUBJECTIVE: Patient doing reasonably well denies any chest pain improved shortness of breath resting comfortably feels well enough to go home [facility] and has been encouraged to do either follow-up here or at the [place] or both   Vitals:   07/26/24 2036 07/27/24 0021 07/27/24 0357 07/27/24 0854  BP: (!) 130/46 (!) 128/49 (!) 124/42 (!) 139/52  Pulse: (!) 57 (!) 58 (!) 51 (!) 56  Resp: 20 20 20 17   Temp: 98.5 F (36.9 C) 98.4 F (36.9 C) 98.5 F (36.9 C) 98.4 F (36.9 C)  TempSrc:      SpO2: 95% 98% 97% 98%  Weight:      Height:         Intake/Output Summary (Last 24 hours) at 07/27/2024 1215 Last data filed at 07/27/2024 0900 Gross per 24 hour  Intake 720 ml  Output 1400 ml  Net -680 ml      PHYSICAL EXAM  General: Well developed, well nourished, in no acute distress HEENT:  Normocephalic and atramatic Neck:  No JVD.  Lungs: Clear bilaterally to auscultation and percussion. Heart: HRRR . Normal S1 and S2 without gallops or murmurs.  Abdomen: Bowel sounds are positive, abdomen soft and non-tender  Msk:  Back normal, normal gait. Normal strength and tone for age. Extremities: No clubbing, cyanosis or edema.   Neuro: Alert and oriented X 3. Psych:  Good affect, responds appropriately   LABS: Basic Metabolic Panel: Recent Labs    07/26/24 0629 07/27/24 0500  NA 142 142  K 4.2 4.3  CL 105 103  CO2 30 30  GLUCOSE 87 104*  BUN 22 29*  CREATININE 0.68 0.84  CALCIUM  8.3* 8.4*  MG 2.7* 2.5*  PHOS 3.4 3.7   Liver Function Tests: Recent Labs    07/26/24 0629 07/27/24 0500  AST 15 11*  ALT 11 9  ALKPHOS 53 53  BILITOT 0.4 0.7  PROT 6.2* 5.5*  ALBUMIN 3.3* 2.9*   No results for input(s): LIPASE, AMYLASE in the last 72 hours. CBC: Recent Labs    07/26/24 0629 07/27/24 0500  WBC 9.3 7.9  NEUTROABS 6.4 6.0  HGB 10.7* 10.3*  HCT 34.6* 32.7*  MCV 90.1 90.1  PLT 204 154   Cardiac Enzymes: No results  for input(s): CKTOTAL, CKMB, CKMBINDEX, TROPONINI in the last 72 hours. BNP: Invalid input(s): POCBNP D-Dimer: No results for input(s): DDIMER in the last 72 hours. Hemoglobin A1C: No results for input(s): HGBA1C in the last 72 hours. Fasting Lipid Panel: No results for input(s): CHOL, HDL, LDLCALC, TRIG, CHOLHDL, LDLDIRECT in the last 72 hours. Thyroid  Function Tests: No results for input(s): TSH, T4TOTAL, T3FREE, THYROIDAB in the last 72 hours.  Invalid input(s): FREET3 Anemia Panel: No results for input(s): VITAMINB12, FOLATE, FERRITIN, TIBC, IRON , RETICCTPCT in the last 72 hours.  CARDIAC CATHETERIZATION Result Date: 07/26/2024   There is mild left ventricular systolic dysfunction.   LV end diastolic pressure is mildly elevated.   The left ventricular ejection fraction is 35-45% by visual estimate.   No indication for antiplatelet therapy at this time . Conclusion Inpatient left heart cath right radial approach Left ventriculogram Mildly depressed left ventricular function globally EF around 40% Coronaries Left main large free of disease LAD large free of disease Circumflex large free of disease RCA large free of disease Right dominant system Intervention deferred not indicated Patient tolerated procedure well No complications     Echo reduced  left ventricular function EF around 40%  TELEMETRY: Sinus rhythm rate of 60:  ASSESSMENT AND PLAN:  Principal Problem:   COPD (chronic obstructive pulmonary disease) (HCC) Active Problems:   COPD with acute exacerbation (HCC)   Acute hypoxic respiratory failure (HCC)    Plan Continue current therapy post heart failure admission Cardiomyopathy EF around 40% recommend GDMT BiDil metoprolol  diuretics spironolactone  Jardiance Continue inhalers status post antibiotics steroid therapy for COPD Borderline troponins probably consistent with demand ischemia rather than ACS New HFrEF nonischemic  possibly Takotsubo variant continue cardiomyopathy management consider follow-up with heart failure clinic Recommend referral to cardiac rehab for heart failure Advised patient refrain from tobacco abuse  Cindy JONETTA Lovelace, MD 07/27/2024 12:15 PM

## 2024-07-29 ENCOUNTER — Other Ambulatory Visit: Payer: Self-pay | Admitting: Obstetrics and Gynecology

## 2024-07-29 LAB — LIPOPROTEIN A (LPA): Lipoprotein (a): 13.1 nmol/L (ref <75.0)

## 2024-08-28 ENCOUNTER — Emergency Department

## 2024-08-28 ENCOUNTER — Encounter: Payer: Self-pay | Admitting: Intensive Care

## 2024-08-28 ENCOUNTER — Other Ambulatory Visit: Payer: Self-pay

## 2024-08-28 ENCOUNTER — Emergency Department
Admission: EM | Admit: 2024-08-28 | Discharge: 2024-08-28 | Disposition: A | Discharge status: Home / Self Care | Attending: Emergency Medicine | Admitting: Emergency Medicine

## 2024-08-28 DIAGNOSIS — S52501A Unspecified fracture of the lower end of right radius, initial encounter for closed fracture: Secondary | ICD-10-CM | POA: Insufficient documentation

## 2024-08-28 DIAGNOSIS — W19XXXA Unspecified fall, initial encounter: Secondary | ICD-10-CM

## 2024-08-28 DIAGNOSIS — I110 Hypertensive heart disease with heart failure: Secondary | ICD-10-CM | POA: Insufficient documentation

## 2024-08-28 DIAGNOSIS — W01198A Fall on same level from slipping, tripping and stumbling with subsequent striking against other object, initial encounter: Secondary | ICD-10-CM | POA: Insufficient documentation

## 2024-08-28 DIAGNOSIS — I2510 Atherosclerotic heart disease of native coronary artery without angina pectoris: Secondary | ICD-10-CM | POA: Insufficient documentation

## 2024-08-28 DIAGNOSIS — I509 Heart failure, unspecified: Secondary | ICD-10-CM | POA: Insufficient documentation

## 2024-08-28 MED ORDER — ONDANSETRON 4 MG PO TBDP: 4.0000 mg | ORAL_TABLET | Freq: Three times a day (TID) | ORAL | 0 refills | Status: DC | PRN

## 2024-08-28 MED ORDER — OXYCODONE HCL 5 MG PO TABS: 5.0000 mg | ORAL_TABLET | Freq: Three times a day (TID) | ORAL | 0 refills | Status: DC | PRN

## 2024-08-28 MED ORDER — OXYCODONE HCL 5 MG PO TABS
5.0000 mg | ORAL_TABLET | Freq: Once | ORAL | Status: AC
  Administered 2024-08-28: 5 mg via ORAL
  Filled 2024-08-28: qty 1

## 2024-08-28 MED ORDER — ONDANSETRON 4 MG PO TBDP
4.0000 mg | ORAL_TABLET | Freq: Once | ORAL | Status: AC
  Administered 2024-08-28: 4 mg via ORAL
  Filled 2024-08-28: qty 1

## 2024-08-28 NOTE — Discharge Instructions (Addendum)
 You were evaluated in the ED for a fall.  Your wrist x-ray shows an acute comminuted intra-articular distal right radial fracture with impaction and dorsal angulation we have placed you in a splint and you will need to follow-up with orthopedic on Monday for further evaluation and management.  Call their office to schedule this appointment.  Your head CT, cervical spine CT, right tib-fib x-ray and sacral x-ray is normal.

## 2024-08-28 NOTE — ED Notes (Signed)
 Received verbal orders from provider for O2, 2L via nasal cannula due to COPD.

## 2024-08-28 NOTE — ED Provider Notes (Signed)
 [facility] Emergency Department Provider Note     Event Date/Time   First MD Initiated Contact with Patient 08/28/24 1958     (approximate)   History   Fall   HPI  Cindy Gutierrez is a 63 y.o. female with past medical history of HTN, hepatitis C, thyroid  disease, AVR and MVR, pulmonary nodule, pulmonary HTN, CAD, CHF and neuropathy presents to the ED after mechanical fall.  Patient reports standing on a kitchen chair to reach an item when she lost her balance and fell, landing on her right wrist and then onto her buttocks.  During the fall she bounced backward and struck the back of her head but denies LOC.  She also believes she struck her right shin on the chair, now with swelling and bruising.  Denies anticoagulation use.     Physical Exam   Triage Vital Signs: ED Triage Vitals  Encounter Vitals Group     BP 08/28/24 1853 136/67     Girls Systolic BP Percentile --      Girls Diastolic BP Percentile --      Boys Systolic BP Percentile --      Boys Diastolic BP Percentile --      Pulse Rate 08/28/24 1853 78     Resp 08/28/24 1853 18     Temp 08/28/24 1853 99 F (37.2 C)     Temp Source 08/28/24 1853 Oral     SpO2 08/28/24 1853 95 %     Weight 08/28/24 1849 169 lb 15.6 oz (77.1 kg)     Height 08/28/24 1849 5' 10 (1.778 m)     Head Circumference --      Peak Flow --      Pain Score 08/28/24 1848 8     Pain Loc --      Pain Education --      Exclude from Growth Chart --     Most recent vital signs: Vitals:   08/28/24 1853  BP: 136/67  Pulse: 78  Resp: 18  Temp: 99 F (37.2 C)  SpO2: 95%   General: Well appearing and comfortable. Alert and oriented. INAD.  Skin:  Warm, dry and intact. No rashes or lesions noted.     Head:  NCAT.  No scalp hematoma noted. Eyes:  PERRLA. EOMI.  Ears:  No postauricular ecchymosis Neck:   No midline cervical spine tenderness to palpation. CV:  Good peripheral perfusion. RRR.  RESP:  Normal effort.  LCTAB.  ABD:  No distention. Soft, Non tender.  BACK:  Spinous process is midline without deformity or tenderness. MSK:   Swelling and moderate size ecchymosis noted to right distal anterior shin.  Tenderness to palpation.  Pedal pulses palpated bilaterally 2 +  Right wrist with mild deformity.  Moderate edema.  Limited range of motion secondary to pain and swelling.  Radial pulses palpated.  Full range of motion in all digits.  Good capillary refill.  NEURO: Cranial nerves II-XII intact. No focal deficits. Speech clear. Sensation and motor function intact.   ED Results / Procedures / Treatments   Labs (all labs ordered are listed, but only abnormal results are displayed) Labs Reviewed - No data to display  RADIOLOGY  I personally viewed and evaluated these images as part of my medical decision making, as well as reviewing the written report by the radiologist.  CT Cervical Spine Wo Contrast Result Date: 08/28/2024 EXAM: CT CERVICAL SPINE WITHOUT CONTRAST 08/28/2024 07:39:24 PM TECHNIQUE: CT  of the cervical spine was performed without the administration of intravenous contrast. Multiplanar reformatted images are provided for review. Automated exposure control, iterative reconstruction, and/or weight based adjustment of the mA/kV was utilized to reduce the radiation dose to as low as reasonably achievable. COMPARISON: 09/10/2022. CLINICAL HISTORY: Recent fall with neck pain FINDINGS: CERVICAL SPINE: BONES AND ALIGNMENT: Stable straightening of the normal cervical lordosis is noted. No acute fracture or acute facet abnormality is noted. The odontoid is within normal limits. DEGENERATIVE CHANGES: Multilevel osteophytic change and facet hypertrophic changes are seen. SOFT TISSUES: Surrounding soft tissue structures show prominence of the left lobe of the thyroid  with a heterogeneous appearing nodule measuring up to 3.3 cm. This is stable from a prior ultrasound from 2022 and not felt to warrant  biopsy. The lung apices demonstrate emphysematous changes. IMPRESSION: 1. Multilevel osteophytic change and facet hypertrophic changes without acute abnormality. . 2. Incidental emphysematous changes at the lung apices; for patients 64 years old, consider evaluation for a low-dose CT lung cancer screening program. Electronically signed by: Oneil Devonshire MD 08/28/2024 07:51 PM EST RP Workstation: [id number]   CT Head Wo Contrast Result Date: 08/28/2024 EXAM: CT HEAD WITHOUT CONTRAST 08/28/2024 07:39:24 PM TECHNIQUE: CT of the head was performed without the administration of intravenous contrast. Automated exposure control, iterative reconstruction, and/or weight based adjustment of the mA/kV was utilized to reduce the radiation dose to as low as reasonably achievable. COMPARISON: 01/22/2006 CLINICAL HISTORY: Recent fall with headaches FINDINGS: BRAIN AND VENTRICLES: No acute hemorrhage. No evidence of acute infarct. No extra-axial collection. No mass effect or midline shift. ORBITS: No acute abnormality. SINUSES: No acute abnormality. SOFT TISSUES AND SKULL: No acute soft tissue abnormality. No skull fracture. IMPRESSION: 1. No acute intracranial abnormality. Electronically signed by: Oneil Devonshire MD 08/28/2024 07:48 PM EST RP Workstation: [id number]   DG Sacrum/Coccyx Result Date: 08/28/2024 CLINICAL DATA:  Clemens, pain EXAM: DG SACRUM/COCCYX 1-2+V COMPARISON:  None Available. FINDINGS: Frontal and lateral views of the sacrum and coccyx are obtained on 3 images. There are no acute displaced fractures. Moderate degenerative changes at the lumbosacral junction. The sacroiliac joints are unremarkable. IMPRESSION: 1. No acute displaced fracture. 2. Moderate degenerative changes of the lower lumbar spine. Electronically Signed   By: Ozell Daring M.D.   On: 08/28/2024 19:35   DG Tibia/Fibula Right Result Date: 08/28/2024 CLINICAL DATA:  Clemens, right shin pain EXAM: RIGHT TIBIA AND FIBULA - 2 VIEW COMPARISON:   None Available. FINDINGS: Frontal and lateral views of the right tibia and fibula are obtained on 4 images. There are no acute displaced fractures. Alignment is anatomic. Joint spaces are well preserved. There is subcutaneous edema involving the distal right lower leg. IMPRESSION: 1. Subcutaneous edema of the distal right lower leg. 2. No acute displaced fracture. Electronically Signed   By: Ozell Daring M.D.   On: 08/28/2024 19:34   DG Wrist Complete Right Result Date: 08/28/2024 CLINICAL DATA:  Clemens, pain EXAM: RIGHT WRIST - COMPLETE 3+ VIEW COMPARISON:  02/22/2014 FINDINGS: Frontal, oblique, and lateral views of the right wrist are obtained. There is a comminuted intra-articular distal right radial fracture, with slight impaction and dorsal angulation at the fracture site. The radiocarpal joint remains intact. No other acute displaced fractures. There is eccentric lucency within the distal ulnar diaphysis, with surrounding sclerosis and associated periosteal reaction. The appearance is more suggestive of infection or neoplasm rather than sequela of trauma. Please correlate with clinical symptoms at this location, and further evaluation with nonemergent  MRI may be useful for further characterization. There is diffuse soft tissue swelling of the wrist, greatest dorsally and laterally. IMPRESSION: 1. Acute comminuted intra-articular distal right radial fracture, with impaction and dorsal angulation. 2. Diffuse soft tissue swelling. 3. Eccentric cortical lucency involving the distal ulnar diaphysis, with surrounding sclerosis and evidence of periosteal reaction. This could be infectious or neoplastic, with sequela of prior trauma less likely. Further evaluation with nonemergent MRI may be useful. Electronically Signed   By: Ozell Daring M.D.   On: 08/28/2024 19:33    PROCEDURES:  Critical Care performed: No  Procedures   MEDICATIONS ORDERED IN ED: Medications  oxyCODONE  (Oxy IR/ROXICODONE )  immediate release tablet 5 mg (5 mg Oral Given 08/28/24 2106)  ondansetron  (ZOFRAN -ODT) disintegrating tablet 4 mg (4 mg Oral Given 08/28/24 2106)     IMPRESSION / MDM / ASSESSMENT AND PLAN / ED COURSE  I reviewed the triage vital signs and the nursing notes.                              Clinical Course as of 08/28/24 2238  Wed Aug 28, 2024  2038 Consulted Dr. Davey with orthopedics who reviewed wrist x-ray.  No reduction needed.  Sugar tong splint f/u in office Monday and Tuesday to discuss possible surgical intervention.  [MH]  2203 DG Wrist Complete Right [MH]    Clinical Course User Index [MH] Margrette Monte A, PA-C    63 y.o. female presents to the emergency department for evaluation and treatment of fall. See HPI for further details.   Differential diagnosis includes, but is not limited to fracture, dislocation, hematoma, contusion, ICH, strain  Patient's presentation is most consistent with acute complicated illness / injury requiring diagnostic workup.  Patient with mechanical fall resulting in multiple points of impact.  X-ray of right wrist shows diffuse soft tissue swelling and an acute comminuted distal radial fracture with dorsal displacement.  Consulted with orthopedics who recommends splinting and follow-up in office on Monday or Tuesday for further evaluation.  X-ray of the sacrum and right distal tib-fib show no acute fracture.  CT head and cervical spine are normal with no intracranial injury or cervical spine pathology.  Injury to right shin appears to be soft tissue contusion only.  RICE therapy education provided.  Pain control in ED with oxycodone  and Zofran  with some improvement per patient.  Patient placed in sugar-tong splint.  Neurovascularly intact prior and following splint application.  ED return precautions discussed.  Patient is to follow-up with orthopedic on Monday in which she verbalized understanding.  Patient is in stable condition for discharge  home.   FINAL CLINICAL IMPRESSION(S) / ED DIAGNOSES   Final diagnoses:  Fall, initial encounter  Closed fracture of distal end of right radius, unspecified fracture morphology, initial encounter     Rx / DC Orders   ED Discharge Orders          Ordered    oxyCODONE  (ROXICODONE ) 5 MG immediate release tablet  Every 8 hours PRN        08/28/24 2208    ondansetron  (ZOFRAN -ODT) 4 MG disintegrating tablet  Every 8 hours PRN        08/28/24 2208             Note:  This document was prepared using Dragon voice recognition software and may include unintentional dictation errors.    Margrette, Hatice Bubel A, PA-C 08/28/24 2238  Levander Slate, MD 08/29/24 [phone]

## 2024-08-28 NOTE — ED Triage Notes (Signed)
 Arrived by [facility] from home for mechanical fall. Right shin hematoma present with small cut. C/o right wrist pain, right shin pain, and buttocks pain. Reports hitting back of head.    Wears intermittent oxygen. Denies needing O2 at this time.

## 2024-08-28 NOTE — ED Triage Notes (Signed)
 First Nurse Note:  Pt via [facility] from home. Pt c/o mechanical fall, landed on the R ride, hematoma to the R chin. Denies any LOC. Denies head injury. Denies blood thinners. EMS reports some SOB en route, hx of COPD. EMS report uses O2 PRN. EMS has her on 3L [place] on arrival. Pt took 1g of Tylenol  PTA. Pt is A&Ox4 and NAD

## 2024-08-30 ENCOUNTER — Other Ambulatory Visit: Payer: Self-pay

## 2024-08-30 ENCOUNTER — Other Ambulatory Visit (HOSPITAL_COMMUNITY): Payer: Self-pay

## 2024-08-30 ENCOUNTER — Emergency Department

## 2024-08-30 ENCOUNTER — Observation Stay
Admission: EM | Admit: 2024-08-30 | Discharge: 2024-09-02 | Disposition: A | Discharge status: Home / Self Care | Attending: Internal Medicine | Admitting: Internal Medicine

## 2024-08-30 ENCOUNTER — Encounter: Payer: Self-pay | Admitting: Radiology

## 2024-08-30 DIAGNOSIS — R0789 Other chest pain: Secondary | ICD-10-CM

## 2024-08-30 DIAGNOSIS — I10 Essential (primary) hypertension: Secondary | ICD-10-CM | POA: Insufficient documentation

## 2024-08-30 DIAGNOSIS — W19XXXA Unspecified fall, initial encounter: Secondary | ICD-10-CM | POA: Insufficient documentation

## 2024-08-30 DIAGNOSIS — J439 Emphysema, unspecified: Secondary | ICD-10-CM | POA: Insufficient documentation

## 2024-08-30 DIAGNOSIS — S2242XA Multiple fractures of ribs, left side, initial encounter for closed fracture: Secondary | ICD-10-CM | POA: Insufficient documentation

## 2024-08-30 DIAGNOSIS — R051 Acute cough: Secondary | ICD-10-CM

## 2024-08-30 DIAGNOSIS — Z79899 Other long term (current) drug therapy: Secondary | ICD-10-CM | POA: Insufficient documentation

## 2024-08-30 DIAGNOSIS — I5181 Takotsubo syndrome: Secondary | ICD-10-CM | POA: Diagnosis present

## 2024-08-30 DIAGNOSIS — S2249XA Multiple fractures of ribs, unspecified side, initial encounter for closed fracture: Secondary | ICD-10-CM | POA: Insufficient documentation

## 2024-08-30 DIAGNOSIS — R918 Other nonspecific abnormal finding of lung field: Secondary | ICD-10-CM | POA: Insufficient documentation

## 2024-08-30 DIAGNOSIS — I428 Other cardiomyopathies: Secondary | ICD-10-CM | POA: Insufficient documentation

## 2024-08-30 DIAGNOSIS — E049 Nontoxic goiter, unspecified: Secondary | ICD-10-CM

## 2024-08-30 DIAGNOSIS — K5730 Diverticulosis of large intestine without perforation or abscess without bleeding: Secondary | ICD-10-CM | POA: Insufficient documentation

## 2024-08-30 DIAGNOSIS — Z7982 Long term (current) use of aspirin: Secondary | ICD-10-CM | POA: Insufficient documentation

## 2024-08-30 DIAGNOSIS — J9601 Acute respiratory failure with hypoxia: Secondary | ICD-10-CM | POA: Diagnosis present

## 2024-08-30 DIAGNOSIS — S8011XA Contusion of right lower leg, initial encounter: Secondary | ICD-10-CM | POA: Insufficient documentation

## 2024-08-30 DIAGNOSIS — J189 Pneumonia, unspecified organism: Secondary | ICD-10-CM

## 2024-08-30 DIAGNOSIS — R0602 Shortness of breath: Secondary | ICD-10-CM

## 2024-08-30 DIAGNOSIS — R911 Solitary pulmonary nodule: Secondary | ICD-10-CM

## 2024-08-30 DIAGNOSIS — J441 Chronic obstructive pulmonary disease with (acute) exacerbation: Secondary | ICD-10-CM | POA: Diagnosis not present

## 2024-08-30 DIAGNOSIS — R1012 Left upper quadrant pain: Secondary | ICD-10-CM

## 2024-08-30 DIAGNOSIS — I700 Atherosclerosis of aorta: Secondary | ICD-10-CM | POA: Insufficient documentation

## 2024-08-30 LAB — BASIC METABOLIC PANEL WITH GFR
Anion gap: 11 (ref 5–15)
BUN: 14 mg/dL (ref 8–23)
CO2: 25 mmol/L (ref 22–32)
Calcium: 9.6 mg/dL (ref 8.9–10.3)
Chloride: 105 mmol/L (ref 98–111)
Creatinine, Ser: 0.75 mg/dL (ref 0.44–1.00)
GFR, Estimated: >60 mL/min (ref >60)
Glucose, Bld: 101 mg/dL — ABNORMAL HIGH (ref 70–99)
Potassium: 4.3 mmol/L (ref 3.5–5.1)
Sodium: 142 mmol/L (ref 135–145)

## 2024-08-30 LAB — CBC WITH DIFFERENTIAL/PLATELET
Abs Immature Granulocytes: 0.06 K/uL (ref 0.00–0.07)
Basophils Absolute: 0.1 K/uL (ref 0.0–0.1)
Basophils Relative: 1 %
Eosinophils Absolute: 0.1 K/uL (ref 0.0–0.5)
Eosinophils Relative: 1 %
HCT: 40.9 % (ref 36.0–46.0)
Hemoglobin: 12.4 g/dL (ref 12.0–15.0)
Immature Granulocytes: 1 %
Lymphocytes Relative: 7 %
Lymphs Abs: 0.8 K/uL (ref 0.7–4.0)
MCH: 27.4 pg (ref 26.0–34.0)
MCHC: 30.3 g/dL (ref 30.0–36.0)
MCV: 90.5 fL (ref 80.0–100.0)
Monocytes Absolute: 1 K/uL (ref 0.1–1.0)
Monocytes Relative: 8 %
Neutro Abs: 9.9 K/uL — ABNORMAL HIGH (ref 1.7–7.7)
Neutrophils Relative %: 82 %
Platelets: 215 K/uL (ref 150–400)
RBC: 4.52 MIL/uL (ref 3.87–5.11)
RDW: 13.2 % (ref 11.5–15.5)
WBC: 11.9 K/uL — ABNORMAL HIGH (ref 4.0–10.5)
nRBC: 0 % (ref 0.0–0.2)

## 2024-08-30 LAB — RESPIRATORY PANEL BY PCR

## 2024-08-30 LAB — RESP PANEL BY RT-PCR (RSV, FLU A&B, COVID)  RVPGX2
Influenza A by PCR: NEGATIVE
Influenza B by PCR: NEGATIVE
Resp Syncytial Virus by PCR: NEGATIVE
SARS Coronavirus 2 by RT PCR: NEGATIVE

## 2024-08-30 LAB — PRO BRAIN NATRIURETIC PEPTIDE: Pro Brain Natriuretic Peptide: 824 pg/mL — ABNORMAL HIGH (ref <300.0)

## 2024-08-30 LAB — TROPONIN T, HIGH SENSITIVITY
Troponin T High Sensitivity: 38 ng/L — ABNORMAL HIGH (ref 0–19)
Troponin T High Sensitivity: 35 ng/L — ABNORMAL HIGH (ref 0–19)

## 2024-08-30 MED ORDER — SODIUM CHLORIDE 0.9 % IV SOLN
100.0000 mg | Freq: Once | INTRAVENOUS | Status: AC
  Administered 2024-08-30: 100 mg via INTRAVENOUS
  Filled 2024-08-30: qty 100

## 2024-08-30 MED ORDER — BUDESON-GLYCOPYRROL-FORMOTEROL 160-9-4.8 MCG/ACT IN AERO
2.0000 | INHALATION_SPRAY | Freq: Two times a day (BID) | RESPIRATORY_TRACT | Status: DC
  Administered 2024-08-30: 2 via RESPIRATORY_TRACT
  Filled 2024-08-30 (×2): qty 5.9

## 2024-08-30 MED ORDER — METOPROLOL SUCCINATE ER 25 MG PO TB24
12.5000 mg | ORAL_TABLET | Freq: Every day | ORAL | Status: DC
  Administered 2024-08-31 – 2024-09-02 (×2): 12.5 mg via ORAL
  Filled 2024-08-30 (×2): qty 1

## 2024-08-30 MED ORDER — ENOXAPARIN SODIUM 40 MG/0.4ML IJ SOSY
40.0000 mg | PREFILLED_SYRINGE | INTRAMUSCULAR | Status: DC
  Administered 2024-08-31 – 2024-09-01 (×2): 40 mg via SUBCUTANEOUS
  Filled 2024-08-30 (×3): qty 0.4

## 2024-08-30 MED ORDER — IPRATROPIUM-ALBUTEROL 0.5-2.5 (3) MG/3ML IN SOLN
6.0000 mL | Freq: Once | RESPIRATORY_TRACT | Status: AC
  Administered 2024-08-30: 6 mL via RESPIRATORY_TRACT
  Filled 2024-08-30: qty 3

## 2024-08-30 MED ORDER — LIDOCAINE 5 % EX PTCH
1.0000 | MEDICATED_PATCH | CUTANEOUS | Status: DC
  Administered 2024-08-30 – 2024-09-01 (×2): 1 via TRANSDERMAL
  Filled 2024-08-30 (×2): qty 1

## 2024-08-30 MED ORDER — ONDANSETRON HCL 4 MG/2ML IJ SOLN
4.0000 mg | Freq: Four times a day (QID) | INTRAMUSCULAR | Status: DC | PRN
  Administered 2024-08-30 – 2024-09-01 (×2): 4 mg via INTRAVENOUS
  Filled 2024-08-30 (×3): qty 2

## 2024-08-30 MED ORDER — PREGABALIN 75 MG PO CAPS: 150.0000 mg | ORAL_CAPSULE | Freq: Two times a day (BID) | ORAL | Status: DC

## 2024-08-30 MED ORDER — PREGABALIN 75 MG PO CAPS
150.0000 mg | ORAL_CAPSULE | Freq: Two times a day (BID) | ORAL | Status: DC
  Administered 2024-08-30 – 2024-09-02 (×6): 150 mg via ORAL
  Filled 2024-08-30 (×6): qty 2

## 2024-08-30 MED ORDER — NICOTINE 21 MG/24HR TD PT24
21.0000 mg | MEDICATED_PATCH | Freq: Every day | TRANSDERMAL | Status: DC
  Administered 2024-08-30 – 2024-09-02 (×4): 21 mg via TRANSDERMAL
  Filled 2024-08-30 (×4): qty 1

## 2024-08-30 MED ORDER — SODIUM CHLORIDE 0.9 % IV SOLN
1.0000 g | Freq: Once | INTRAVENOUS | Status: AC
  Administered 2024-08-30: 1 g via INTRAVENOUS
  Filled 2024-08-30: qty 10

## 2024-08-30 MED ORDER — ONDANSETRON HCL 4 MG PO TABS
4.0000 mg | ORAL_TABLET | Freq: Four times a day (QID) | ORAL | Status: DC | PRN
  Administered 2024-08-31: 4 mg via ORAL
  Filled 2024-08-30: qty 1

## 2024-08-30 MED ORDER — IOHEXOL 300 MG/ML  SOLN
100.0000 mL | Freq: Once | INTRAMUSCULAR | Status: AC | PRN
  Administered 2024-08-30: 100 mL via INTRAVENOUS

## 2024-08-30 MED ORDER — SODIUM CHLORIDE 0.9 % IV SOLN
1.0000 g | INTRAVENOUS | Status: DC
  Administered 2024-08-30 – 2024-09-02 (×3): 1 g via INTRAVENOUS
  Filled 2024-08-30 (×3): qty 10

## 2024-08-30 MED ORDER — METOPROLOL SUCCINATE ER 25 MG PO TB24: 12.5000 mg | ORAL_TABLET | Freq: Every day | ORAL | Status: DC

## 2024-08-30 MED ORDER — ACETAMINOPHEN 325 MG PO TABS
650.0000 mg | ORAL_TABLET | Freq: Four times a day (QID) | ORAL | Status: DC | PRN
  Administered 2024-08-30 – 2024-09-01 (×3): 650 mg via ORAL
  Filled 2024-08-30 (×3): qty 2

## 2024-08-30 MED ORDER — ALBUTEROL SULFATE (2.5 MG/3ML) 0.083% IN NEBU
10.0000 mg/h | INHALATION_SOLUTION | Freq: Once | RESPIRATORY_TRACT | Status: AC
  Administered 2024-08-30: 10 mg/h via RESPIRATORY_TRACT
  Filled 2024-08-30: qty 20
  Filled 2024-08-30: qty 3

## 2024-08-30 MED ORDER — TRAMADOL HCL 50 MG PO TABS
50.0000 mg | ORAL_TABLET | Freq: Four times a day (QID) | ORAL | Status: DC | PRN
  Administered 2024-08-30 – 2024-08-31 (×2): 50 mg via ORAL
  Filled 2024-08-30 (×2): qty 1

## 2024-08-30 MED ORDER — MORPHINE SULFATE (PF) 2 MG/ML IV SOLN
2.0000 mg | Freq: Once | INTRAVENOUS | Status: AC
  Administered 2024-08-30: 2 mg via INTRAVENOUS
  Filled 2024-08-30: qty 1

## 2024-08-30 MED ORDER — PREDNISONE 20 MG PO TABS
40.0000 mg | ORAL_TABLET | Freq: Every day | ORAL | Status: DC
  Administered 2024-08-31 – 2024-09-02 (×3): 40 mg via ORAL
  Filled 2024-08-30 (×3): qty 2

## 2024-08-30 MED ORDER — SODIUM CHLORIDE 0.9 % IV SOLN
500.0000 mg | INTRAVENOUS | Status: DC
  Administered 2024-08-30 – 2024-09-01 (×3): 500 mg via INTRAVENOUS
  Filled 2024-08-30 (×3): qty 5

## 2024-08-30 MED ORDER — BISACODYL 10 MG RE SUPP: 10.0000 mg | Freq: Every day | RECTAL | Status: DC | PRN

## 2024-08-30 NOTE — ED Notes (Signed)
Pt ambulated to toilet and back.  

## 2024-08-30 NOTE — ED Notes (Signed)
 Dr Collie notified of temp of 100.26F

## 2024-08-30 NOTE — ED Notes (Signed)
 Requested lab for blood draw

## 2024-08-30 NOTE — ED Provider Notes (Signed)
 Cindy Gutierrez Provider Note    Event Date/Time   First MD Initiated Contact with Patient 08/30/24 [phone]     (approximate)   History   Shortness of Breath   HPI  TAKERIA Gutierrez is a 63 y.o. female with history of COPD on 2 L nasal cannula at baseline, heart failure with preserved ejection fraction, CAD, heart pretension, anxiety, depression, presenting with shortness of breath and cough.  Also complaining about left lower anterior rib pain.  No fever.  States that she had a fall on Wednesday.  Was told that she had a fracture to her arm.  Did sustain bruising to her right leg that she said was given an x-ray and negative for fracture.   Independent history from EMS, she was having increased work of breathing, 88% on room air.  She declined CPAP at the time, was given 2 albuterol , 1 DuoNeb, IM Solu-Medrol , with improvement to her breathing.  Patient states that she is amenable to BiPAP if needed.    On independent chart review, she had an x-ray of her tib-fib on the right that was negative for acute displaced fracture, does show subcu edema.  She did have a right distal radial fracture.  Splinted.  Has CT head and cervical spine is negative for acute traumatic injury.   Physical Exam   Triage Vital Signs: ED Triage Vitals [08/30/24 0953]  Encounter Vitals Group     BP      Girls Systolic BP Percentile      Girls Diastolic BP Percentile      Boys Systolic BP Percentile      Boys Diastolic BP Percentile      Pulse      Resp      Temp      Temp src      SpO2      Weight 169 lb 12.1 oz (77 kg)     Height 5' 10 (1.778 m)     Head Circumference      Peak Flow      Pain Score 7     Pain Loc      Pain Education      Exclude from Growth Chart     Most recent vital signs: Vitals:   08/30/24 1230 08/30/24 1355  BP: (!) 132/48   Pulse: 89   Resp: 18   Temp:  (!) 100.4 F (38 C)  SpO2: 93%      General: Awake, no distress.  CV:  Good peripheral  perfusion.  Resp:  Normal effort.  Tachypneic, diffuse wheezing bilaterally, left lower anterior thoracic cage is tender on palpation Abd:  No distention.  Soft nontender Other:  Right upper extremity in a splint.  Right shin with ecchymoses   ED Results / Procedures / Treatments   Labs (all labs ordered are listed, but only abnormal results are displayed) Labs Reviewed  PRO BRAIN NATRIURETIC PEPTIDE - Abnormal; Notable for the following components:      Result Value   Pro Brain Natriuretic Peptide 824.0 (*)    All other components within normal limits  CBC WITH DIFFERENTIAL/PLATELET - Abnormal; Notable for the following components:   WBC 11.9 (*)    Neutro Abs 9.9 (*)    All other components within normal limits  BASIC METABOLIC PANEL WITH GFR - Abnormal; Notable for the following components:   Glucose, Bld 101 (*)    All other components within normal limits  TROPONIN T, HIGH  SENSITIVITY - Abnormal; Notable for the following components:   Troponin T High Sensitivity 38 (*)    All other components within normal limits  RESP PANEL BY RT-PCR (RSV, FLU A&B, COVID)  RVPGX2  TROPONIN T, HIGH SENSITIVITY     EKG  EKG shows, sinus rhythm, rate 95, normal QS, normal QTc, no obvious ischemic ST elevation, T wave inversion to aVL, T wave changes new compared to prior   RADIOLOGY On my independent interpretation, chest x-ray without obvious consolidation   PROCEDURES:  Critical Care performed: Yes, see critical care procedure note(s)  .Critical Care  Performed by: Waymond Lorelle Cummins, MD Authorized by: Waymond Lorelle Cummins, MD   Critical care provider statement:    Critical care time (minutes):  40   Critical care was necessary to treat or prevent imminent or life-threatening deterioration of the following conditions:  Respiratory failure   Critical care was time spent personally by me on the following activities:  Development of treatment plan with patient or surrogate, discussions with  consultants, evaluation of patient's response to treatment, examination of patient, ordering and review of laboratory studies, ordering and review of radiographic studies, ordering and performing treatments and interventions, pulse oximetry, re-evaluation of patient's condition and review of old charts    MEDICATIONS ORDERED IN ED: Medications  lidocaine  (LIDODERM ) 5 % 1 patch (1 patch Transdermal Patch Applied 08/30/24 1233)  cefTRIAXone  (ROCEPHIN ) 1 g in sodium chloride  0.9 % 100 mL IVPB (1 g Intravenous New Bag/Given 08/30/24 1355)  doxycycline  (VIBRAMYCIN ) 100 mg in sodium chloride  0.9 % 250 mL IVPB (0 mg Intravenous Stopped 08/30/24 1350)  ipratropium-albuterol  (DUONEB) 0.5-2.5 (3) MG/3ML nebulizer solution 6 mL (6 mLs Nebulization Given 08/30/24 1011)  albuterol  (PROVENTIL ) (2.5 MG/3ML) 0.083% nebulizer solution (10 mg/hr Nebulization Given 08/30/24 1236)  iohexol  (OMNIPAQUE ) 300 MG/ML solution 100 mL (100 mLs Intravenous Contrast Given 08/30/24 1205)  morphine  (PF) 2 MG/ML injection 2 mg (2 mg Intravenous Given 08/30/24 1230)     IMPRESSION / MDM / ASSESSMENT AND PLAN / ED COURSE  I reviewed the triage vital signs and the nursing notes.                              Differential diagnosis includes, but is not limited to, COPD exacerbation, pneumonia, viral illness, atypical ACS, rib fracture, contusion, strain, sprain, did consider pneumothorax but unable to hear breath sounds that appear equal bilaterally.  Did consider PE but with the wheezing and cough, PE is lower on the differential.  Will get labs, EKG, troponin, chest x-ray, 2 more DuoNebs, doxycycline  for COPD exacerbation.  Patient's presentation is most consistent with acute presentation with potential threat to life or bodily function.  Independent interpretation of labs and imaging below.  CT imaging did not reveal any acute traumatic injury, did show possible developing bronchopneumonia.  Will have ceftriaxone  to her  antibiotic regiment.  Given her COPD exacerbation and pneumonia.  Will plan to bring her in for further management.  Consult to hospitalist will admit her.  She is admitted.    Clinical Course as of 08/30/24 1402  Fri Aug 30, 2024  1103 DG Chest 1 View IMPRESSION: 1. No acute cardiopulmonary process. 2. Similar prominence of interstitial markings likely related to COPD.   [TT]  1122 Independent review of labs, electrolytes not severely deranged, RSV, COVID, influenza is negative, she has a leukocytosis, BNP is mildly elevated, troponin is mildly elevated. [TT]  1124  On reassessment patient still tachypneic, states that shortness of breath is improving.  Still complaining about left chest wall pain, says that she is also having abdominal pain and is worried about her spleen from the fall.  Will get a CT chest abdomen pelvis.  Will give her some a continuous albuterol . [TT]  1318 CT CHEST ABDOMEN PELVIS W CONTRAST IMPRESSION: 1. No acute, traumatic injury within the chest, abdomen, and pelvis. 2. Confluent centrilobular emphysema throughout the lungs. Small region of dependent, nodular consolidation and reticulation in the right lower lobe with apparent bronchiolectasis, favored to represent atelectasis or scarring. Changes of aspiration or a developing bronchopneumonia could have this appearance in the correct clinical context. 3. Unchanged posterior right apical lung nodule measuring 6.6 mm, likely benign given the relative stability. 4. Similar appearance of the multinodular goiter. 5. Scattered colonic diverticulosis. No changes of acute diverticulitis.  Aortic Atherosclerosis (ICD10-I70.0) and Emphysema (ICD10-J43.9).   [TT]  1402 Troponin T, High Sensitivity(!) Troponin is stable. [TT]    Clinical Course User Index [TT] Waymond Lorelle Cummins, MD     FINAL CLINICAL IMPRESSION(S) / ED DIAGNOSES   Final diagnoses:  COPD exacerbation (HCC)  Chest wall pain  Left upper quadrant  abdominal pain  Shortness of breath  Acute cough  Pneumonia due to infectious organism, unspecified laterality, unspecified part of lung  Lung nodule  Thyroid  goiter     Rx / DC Orders   ED Discharge Orders     None        Note:  This document was prepared using Dragon voice recognition software and may include unintentional dictation errors.    Waymond Lorelle Cummins, MD 08/30/24 [phone]

## 2024-08-30 NOTE — Plan of Care (Signed)
  Problem: Education: Goal: Knowledge of General Education information will improve Description: Including pain rating scale, medication(s)/side effects and non-pharmacologic comfort measures Outcome: Progressing   Problem: Clinical Measurements: Goal: Ability to maintain clinical measurements within normal limits will improve Outcome: Progressing Goal: Will remain free from infection Outcome: Progressing Goal: Diagnostic test results will improve Outcome: Progressing Goal: Respiratory complications will improve Outcome: Progressing Goal: Cardiovascular complication will be avoided Outcome: Progressing   Problem: Activity: Goal: Risk for activity intolerance will decrease Outcome: Progressing   Problem: Nutrition: Goal: Adequate nutrition will be maintained Outcome: Progressing   Problem: Coping: Goal: Level of anxiety will decrease Outcome: Progressing   Problem: Safety: Goal: Ability to remain free from injury will improve Outcome: Progressing

## 2024-08-30 NOTE — ED Triage Notes (Signed)
 Pt arrives via EMS from home for SOB. Pt has COPD and wears oxygen 2lpm at home. Per EMS, pt was 88% at home. Pt has received 2 albuterol , 1 duoneb, and 125mg  solumedrol IM. Pt arrives alert and able to answer questions with improvement in sx. Pt refused IV with EMS

## 2024-08-30 NOTE — H&P (Signed)
 History and Physical    Patient: Cindy Gutierrez FMW:[id number] DOB: 1960-12-02 DOA: 08/30/2024 DOS: the patient was seen and examined on 08/30/2024 PCP: [facility]  Patient coming from: Home  Chief Complaint:  Chief Complaint  Patient presents with   Shortness of Breath   HPI: Cindy Gutierrez is a 63 y.o. female with medical history significant of HFrEF, CAD, Pulmonary HTN, neuropathy, COPD/chronic hypoxic respiratory failure on 2 L nasal cannula presents to the emergency department for evaluation of nonradiating, constant, left-sided rib pain and shortness of breath that started last night.  Pain is worse with breathing, movement, and palpation.  She also reports nonproductive cough and fever.  Of note, she fell backwards onto her bottom while standing on a chair trying to change a light bulb on Wednesday.  She was evaluated in the emergency department at that time and was found to have a right wrist fracture which was splinted.  She reports her pain from the fall is uncontrolled, and she is having difficulty functioning at home.  No focal weakness, numbness, tingling.   Per EMS she was saturating 88% on room air.  She was given albuterol  DuoNebs and Solu-Medrol  and route.  Here she is saturating mid 90% on 3 L nasal cannula.  She did have a fever of 100.4, with a white blood cell count of 11.9.  She is nontachycardic.COVID flu and RSV are negative. Troponin flat.  CT chest abdomen and pelvis noted a small dependent nodular consolidation in the right lower lobe favored to represent atelectasis or scarring, but developing bronchopneumonia is a consideration.    Admission requested for further management of possible pneumonia   She recently quit smoking and requests a nicotine  patch.    Review of Systems: Review of Systems  Constitutional:  Positive for fever and malaise/fatigue. Negative for chills, diaphoresis and weight loss.  Eyes:  Negative for photophobia.  Respiratory:   Positive for cough, shortness of breath and wheezing. Negative for sputum production.   Cardiovascular:  Positive for chest pain. Negative for palpitations, orthopnea, claudication and leg swelling.  Gastrointestinal:  Positive for constipation. Negative for abdominal pain, blood in stool, diarrhea, heartburn, melena, nausea and vomiting.  Genitourinary:  Negative for dysuria and frequency.  Musculoskeletal:  Positive for back pain, falls and joint pain. Negative for neck pain.  Skin:  Negative for rash.  Neurological:  Positive for tingling (chronic neuropathy). Negative for dizziness.  Psychiatric/Behavioral:  Negative for substance abuse.     Past Medical History:  Diagnosis Date   Aortic valve regurgitation    CHF (congestive heart failure) (HCC)    COPD (chronic obstructive pulmonary disease) (HCC)    Coronary artery disease    Depression    Hepatitis C    Hyperparathyroidism    Hypertension    Mitral valve regurgitation    Neuropathy    to R arm only   Non-toxic multinodular goiter    Pulmonary hypertension (HCC) 2022   noted on CT   Pulmonary nodule    Thyroid  disease    Past Surgical History:  Procedure Laterality Date   COLONOSCOPY WITH PROPOFOL  N/A 08/21/2018   Procedure: COLONOSCOPY WITH PROPOFOL ;  Surgeon: Unk Corinn Skiff, MD;  Location: [facility] ENDOSCOPY;  Service: Gastroenterology;  Laterality: N/A;   LEFT HEART CATH AND CORONARY ANGIOGRAPHY N/A 07/26/2024   Procedure: LEFT HEART CATH AND CORONARY ANGIOGRAPHY;  Surgeon: Florencio Cara JONETTA, MD;  Location: [facility] INVASIVE [facility];  Service: Cardiovascular;  Laterality: N/A;   PARATHYROIDECTOMY  Left    TUBAL LIGATION     Social History:  reports that she quit smoking about 30 years ago. Her smoking use included cigarettes. She started smoking about 2 years ago. She has never used smokeless tobacco. She reports that she does not drink alcohol and does not use drugs.  Allergies  Allergen Reactions   Sulfa Antibiotics  Hives and Rash   Bee Pollen Hives   Hydrochlorothiazide      Chest tightness   Lisinopril Nausea Only   Losartan     Penicillins Hives   Tiotropium Bromide     Other reaction(s): Cough   Carvedilol     Family History  Problem Relation Age of Onset   Alzheimer's disease Mother    Heart disease Father     Prior to Admission medications   Medication Sig Start Date End Date Taking? Authorizing Provider  acetaminophen  (TYLENOL ) 325 MG tablet Take 650 mg by mouth 3 (three) times daily as needed for mild pain (pain score 1-3).    [provider]  albuterol  (PROVENTIL ) (2.5 MG/3ML) 0.083% nebulizer solution Take 2.5 mg by nebulization every 4 (four) hours as needed for wheezing or shortness of breath.    [provider]  albuterol  (VENTOLIN  HFA) 108 (90 Base) MCG/ACT inhaler Inhale 2 puffs into the lungs every 6 (six) hours as needed for wheezing or shortness of breath. 10/19/23   Wouk, Devaughn Sayres, MD  aspirin  81 MG chewable tablet Chew 81 mg by mouth daily. 04/13/22   [provider]  budesonide -formoterol  (SYMBICORT ) 160-4.5 MCG/ACT inhaler Inhale 2 puffs into the lungs 2 (two) times daily.    [provider]  calcium  carbonate (TUMS - DOSED IN MG ELEMENTAL CALCIUM ) 500 MG chewable tablet Chew 1 tablet by mouth daily. As needed    [provider]  celecoxib  (CELEBREX ) 200 MG capsule Take 200 mg by mouth 2 (two) times daily.    [provider]  escitalopram  (LEXAPRO ) 10 MG tablet Take 10 mg by mouth daily. 09/25/23   [provider]  furosemide  (LASIX ) 20 MG tablet Take 1 tablet (20 mg total) by mouth daily. 04/18/24   Josette Ade, MD  isosorbide -hydrALAZINE  (BIDIL ) 20-37.5 MG tablet Take 1 tablet by mouth 3 (three) times daily. 07/27/24   Wouk, Devaughn Sayres, MD  metoprolol  succinate (TOPROL -XL) 25 MG 24 hr tablet Take 0.5 tablets (12.5 mg total) by mouth daily. 07/28/24   Wouk, Devaughn Sayres, MD  ondansetron  (ZOFRAN -ODT) 4 MG  disintegrating tablet Take 1 tablet (4 mg total) by mouth every 8 (eight) hours as needed. 08/28/24   Margrette, Myah A, PA-C  oxyCODONE  (ROXICODONE ) 5 MG immediate release tablet Take 1 tablet (5 mg total) by mouth every 8 (eight) hours as needed. 08/28/24 08/28/25  Margrette, Myah A, PA-C  pregabalin  (LYRICA ) 150 MG capsule Take 1 capsule (150 mg total) by mouth 2 (two) times daily. 07/27/24   Wouk, Devaughn Sayres, MD  rosuvastatin  (CRESTOR ) 20 MG tablet Take 20 mg by mouth daily.    [provider]  spironolactone  (ALDACTONE ) 25 MG tablet Take 1 tablet (25 mg total) by mouth daily. 04/19/24   Josette Ade, MD    Physical Exam: Vitals:   08/30/24 1355 08/30/24 1400 08/30/24 1430 08/30/24 1505  BP:  (!) 131/47 (!) 132/101   Pulse:  84 85 91  Resp:  14 13 (!) 22  Temp: (!) 100.4 F (38 C)     TempSrc: Oral     SpO2:  95% 97% 92%  Weight:      Height:       Physical Exam Vitals and nursing note reviewed.  Constitutional:      General: She is not in acute distress.    Appearance: She is not ill-appearing or toxic-appearing.  HENT:     Head: Normocephalic.  Eyes:     Pupils: Pupils are equal, round, and reactive to light.  Cardiovascular:     Rate and Rhythm: Normal rate and regular rhythm.  Pulmonary:     Effort: Pulmonary effort is normal.     Breath sounds: Decreased breath sounds present. No wheezing.  Chest:     Chest wall: Tenderness (TTP left lower lateral rib cage) present.  Abdominal:     Palpations: Abdomen is soft.  Musculoskeletal:     Cervical back: Neck supple.     Right lower leg: No edema.     Left lower leg: No edema.  Skin:    General: Skin is warm and dry.     Comments: Bruising lateral right lower shin above the ankle, no ankle effusion   Neurological:     General: No focal deficit present.     Mental Status: She is alert and oriented to person, place, and time.  Psychiatric:        Mood and Affect: Mood normal.        Behavior: Behavior  normal.     Data Reviewed:   Labs on Admission: I have personally reviewed following labs and imaging studies  CBC: Recent Labs  Lab 08/30/24 1046  WBC 11.9*  NEUTROABS 9.9*  HGB 12.4  HCT 40.9  MCV 90.5  PLT 215   Basic Metabolic Panel: Recent Labs  Lab 08/30/24 1046  NA 142  K 4.3  CL 105  CO2 25  GLUCOSE 101*  BUN 14  CREATININE 0.75  CALCIUM  9.6   GFR: Estimated Creatinine Clearance: 76.8 mL/min (by C-G formula based on SCr of 0.75 mg/dL). Liver Function Tests: No results for input(s): AST, ALT, ALKPHOS, BILITOT, PROT, ALBUMIN in the last 168 hours. No results for input(s): LIPASE, AMYLASE in the last 168 hours. No results for input(s): AMMONIA in the last 168 hours. Coagulation Profile: No results for input(s): INR, PROTIME in the last 168 hours. Cardiac Enzymes: No results for input(s): CKTOTAL, CKMB, CKMBINDEX, TROPONINI in the last 168 hours. BNP (last 3 results) Recent Labs    08/30/24 1046  PROBNP 824.0*   HbA1C: No results for input(s): HGBA1C in the last 72 hours. CBG: No results for input(s): GLUCAP in the last 168 hours. Lipid Profile: No results for input(s): CHOL, HDL, LDLCALC, TRIG, CHOLHDL, LDLDIRECT in the last 72 hours. Thyroid  Function Tests: No results for input(s): TSH, T4TOTAL, FREET4, T3FREE, THYROIDAB in the last 72 hours. Anemia Panel: No results for input(s): VITAMINB12, FOLATE, FERRITIN, TIBC, IRON , RETICCTPCT in the last 72 hours. Urine analysis:    Component Value Date/Time   COLORURINE YELLOW (A) 11/18/2023 1223   APPEARANCEUR CLEAR (A) 11/18/2023 1223   LABSPEC 1.016 11/18/2023 1223   PHURINE 5.0 11/18/2023 1223   GLUCOSEU NEGATIVE 11/18/2023 1223   HGBUR NEGATIVE 11/18/2023 1223   BILIRUBINUR NEGATIVE 11/18/2023 1223   KETONESUR NEGATIVE 11/18/2023 1223   PROTEINUR 100 (A) 11/18/2023 1223   NITRITE NEGATIVE 11/18/2023 1223   LEUKOCYTESUR  NEGATIVE 11/18/2023 1223    Radiological Exams on Admission: CT CHEST ABDOMEN PELVIS W CONTRAST Result Date: 08/30/2024 CLINICAL DATA:  Polytrauma, blunt, COPD exacerbation, chest wall pain, left upper quadrant abdominal  pain EXAM: CT CHEST, ABDOMEN, AND PELVIS WITH CONTRAST TECHNIQUE: Multidetector CT imaging of the chest, abdomen and pelvis was performed following the standard protocol during bolus administration of intravenous contrast. RADIATION DOSE REDUCTION: This exam was performed according to the departmental dose-optimization program which includes automated exposure control, adjustment of the mA and/or kV according to patient size and/or use of iterative reconstruction technique. CONTRAST:  OMNIPAQUE  IOHEXOL  300 MG/ML  SOLN COMPARISON:  08/30/2024, 10/03/2020 FINDINGS: CT CHEST FINDINGS Pulmonary Embolism: While the exam was not optimized for the evaluation of the pulmonary arteries, no central pulmonary embolism visualized. Cardiovascular: No cardiomegaly or pericardial effusion.No aortic aneurysm. Scattered calcified atherosclerosis throughout the aorta. Mediastinum/Nodes: No mediastinal mass.Heterogeneous, almost nodular enlargement of the thyroid  gland, unchanged. Surgical clips along the right inferior thyroid  gland. 1.1 cm precarinal lymph node, likely reactive. No axillary or hilar lymphadenopathy. Lungs/Pleura: Confluent centrilobular emphysema throughout the lungs, with an upper lobe predominance. The trachea and bronchi are otherwise patent. Posterior right lung apical subpleural nodule, measuring 6.6 mm, unchanged. Small region of dependent nodular consolidation and reticulation in the right lower lobe with apparent bronchiolectasis. Curvilinear reticulation in the antero basal left lower lobe with subpleural reticulation posteriorly, likely scarring and atelectasis. No pneumothorax or pleural effusion. CT ABDOMEN PELVIS FINDINGS Hepatobiliary: No mass.No radiopaque stones or wall  thickening of the gallbladder. No intrahepatic or extrahepatic biliary ductal dilation. The portal veins are patent. Pancreas: No mass or main ductal dilation. No peripancreatic inflammation or fluid collection. Spleen: Normal size. No mass. Adrenals/Urinary Tract: No adrenal masses. Subcentimeter hypodensity noted in the right kidney, too small to definitively characterize, but likely a small cyst. no nephrolithiasis or hydronephrosis. The urinary bladder is distended without focal abnormality. Stomach/Bowel: The stomach is decompressed without focal abnormality. No small bowel wall thickening or inflammation. No small bowel obstruction.Normal appendix. Scattered colonic diverticulosis. No changes of acute diverticulitis. Vascular/Lymphatic: No aortic aneurysm. Diffuse aortoiliac atherosclerosis. No intraabdominal or pelvic lymphadenopathy. Reproductive: Age-related atrophy of the uterus and ovaries. No concerning adnexal mass.No free pelvic fluid. Other: No pneumoperitoneum, ascites, or mesenteric inflammation. Musculoskeletal: No acute fracture or destructive lesion. Osteopenia. Multilevel degenerative disc disease of the spine. IMPRESSION: 1. No acute, traumatic injury within the chest, abdomen, and pelvis. 2. Confluent centrilobular emphysema throughout the lungs. Small region of dependent, nodular consolidation and reticulation in the right lower lobe with apparent bronchiolectasis, favored to represent atelectasis or scarring. Changes of aspiration or a developing bronchopneumonia could have this appearance in the correct clinical context. 3. Unchanged posterior right apical lung nodule measuring 6.6 mm, likely benign given the relative stability. 4. Similar appearance of the multinodular goiter. 5. Scattered colonic diverticulosis. No changes of acute diverticulitis. Aortic Atherosclerosis (ICD10-I70.0) and Emphysema (ICD10-J43.9). Electronically Signed   By: Rogelia Myers M.D.   On: 08/30/2024 13:04    DG Chest 1 View Result Date: 08/30/2024 EXAM: 1 VIEW(S) XRAY OF THE CHEST 08/30/2024 10:21:20 AM COMPARISON: 07/23/2024 CLINICAL HISTORY: rib pain anteriorly left FINDINGS: LINES, TUBES AND DEVICES: Vascular clips at base of neck. LUNGS AND PLEURA: Slightly more prominent nonspecific interstitial markings. No pleural effusion. No pneumothorax. HEART AND MEDIASTINUM: Atherosclerotic plaque. No acute abnormality of the cardiac and mediastinal silhouettes. BONES AND SOFT TISSUES: Old healed left rib fractures. No acute osseous abnormality. IMPRESSION: 1. No acute cardiopulmonary process. 2. Similar prominence of interstitial markings likely related to COPD. Electronically signed by: Donnice Mania MD 08/30/2024 10:30 AM EST RP Workstation: [id number]   CT Cervical Spine Wo Contrast Result Date: 08/28/2024  EXAM: CT CERVICAL SPINE WITHOUT CONTRAST 08/28/2024 07:39:24 PM TECHNIQUE: CT of the cervical spine was performed without the administration of intravenous contrast. Multiplanar reformatted images are provided for review. Automated exposure control, iterative reconstruction, and/or weight based adjustment of the mA/kV was utilized to reduce the radiation dose to as low as reasonably achievable. COMPARISON: 09/10/2022. CLINICAL HISTORY: Recent fall with neck pain FINDINGS: CERVICAL SPINE: BONES AND ALIGNMENT: Stable straightening of the normal cervical lordosis is noted. No acute fracture or acute facet abnormality is noted. The odontoid is within normal limits. DEGENERATIVE CHANGES: Multilevel osteophytic change and facet hypertrophic changes are seen. SOFT TISSUES: Surrounding soft tissue structures show prominence of the left lobe of the thyroid  with a heterogeneous appearing nodule measuring up to 3.3 cm. This is stable from a prior ultrasound from 2022 and not felt to warrant biopsy. The lung apices demonstrate emphysematous changes. IMPRESSION: 1. Multilevel osteophytic change and facet hypertrophic  changes without acute abnormality. . 2. Incidental emphysematous changes at the lung apices; for patients 64 years old, consider evaluation for a low-dose CT lung cancer screening program. Electronically signed by: Oneil Devonshire MD 08/28/2024 07:51 PM EST RP Workstation: [id number]   CT Head Wo Contrast Result Date: 08/28/2024 EXAM: CT HEAD WITHOUT CONTRAST 08/28/2024 07:39:24 PM TECHNIQUE: CT of the head was performed without the administration of intravenous contrast. Automated exposure control, iterative reconstruction, and/or weight based adjustment of the mA/kV was utilized to reduce the radiation dose to as low as reasonably achievable. COMPARISON: 01/22/2006 CLINICAL HISTORY: Recent fall with headaches FINDINGS: BRAIN AND VENTRICLES: No acute hemorrhage. No evidence of acute infarct. No extra-axial collection. No mass effect or midline shift. ORBITS: No acute abnormality. SINUSES: No acute abnormality. SOFT TISSUES AND SKULL: No acute soft tissue abnormality. No skull fracture. IMPRESSION: 1. No acute intracranial abnormality. Electronically signed by: Oneil Devonshire MD 08/28/2024 07:48 PM EST RP Workstation: [id number]   DG Sacrum/Coccyx Result Date: 08/28/2024 CLINICAL DATA:  Clemens, pain EXAM: DG SACRUM/COCCYX 1-2+V COMPARISON:  None Available. FINDINGS: Frontal and lateral views of the sacrum and coccyx are obtained on 3 images. There are no acute displaced fractures. Moderate degenerative changes at the lumbosacral junction. The sacroiliac joints are unremarkable. IMPRESSION: 1. No acute displaced fracture. 2. Moderate degenerative changes of the lower lumbar spine. Electronically Signed   By: Ozell Daring M.D.   On: 08/28/2024 19:35   DG Tibia/Fibula Right Result Date: 08/28/2024 CLINICAL DATA:  Clemens, right shin pain EXAM: RIGHT TIBIA AND FIBULA - 2 VIEW COMPARISON:  None Available. FINDINGS: Frontal and lateral views of the right tibia and fibula are obtained on 4 images. There are no acute  displaced fractures. Alignment is anatomic. Joint spaces are well preserved. There is subcutaneous edema involving the distal right lower leg. IMPRESSION: 1. Subcutaneous edema of the distal right lower leg. 2. No acute displaced fracture. Electronically Signed   By: Ozell Daring M.D.   On: 08/28/2024 19:34   DG Wrist Complete Right Result Date: 08/28/2024 CLINICAL DATA:  Clemens, pain EXAM: RIGHT WRIST - COMPLETE 3+ VIEW COMPARISON:  02/22/2014 FINDINGS: Frontal, oblique, and lateral views of the right wrist are obtained. There is a comminuted intra-articular distal right radial fracture, with slight impaction and dorsal angulation at the fracture site. The radiocarpal joint remains intact. No other acute displaced fractures. There is eccentric lucency within the distal ulnar diaphysis, with surrounding sclerosis and associated periosteal reaction. The appearance is more suggestive of infection or neoplasm rather than sequela of trauma. Please correlate  with clinical symptoms at this location, and further evaluation with nonemergent MRI may be useful for further characterization. There is diffuse soft tissue swelling of the wrist, greatest dorsally and laterally. IMPRESSION: 1. Acute comminuted intra-articular distal right radial fracture, with impaction and dorsal angulation. 2. Diffuse soft tissue swelling. 3. Eccentric cortical lucency involving the distal ulnar diaphysis, with surrounding sclerosis and evidence of periosteal reaction. This could be infectious or neoplastic, with sequela of prior trauma less likely. Further evaluation with nonemergent MRI may be useful. Electronically Signed   By: Ozell Daring M.D.   On: 08/28/2024 19:33       Assessment and Plan:  63 y.o. female with medical history significant of HFrEF, CAD, Pulmonary HTN, neuropathy, COPD/chronic hypoxic respiratory failure on 2 L nasal cannula presents to the emergency department for evaluation of left sided rib pain and  shortness of breath. Also c/o diffuse uncontrolled musculoskeletal pain since fall off of a chair on Wednesday.  In the ED she was febrile with evidence of possible pneumonia on CT chest  Admitted for treatment of COPD exacerbation +/- PNA, pain management and PT evaluation    COPD exacerbation  Acute on chronic hypoxic respiratory failure ?RLL pneumonia  - with rib pain from recent fall and fever, possible RLL pneumonia on CT.  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 Observation status is judged to be reasonable and necessary in order to provide the required intensity of service to ensure the patient's safety. The patient's presenting symptoms, physical exam findings, and initial radiographic and laboratory data in the context of their medical condition is felt to place them at decreased risk for further clinical deterioration. Furthermore, it is anticipated that the patient will be medically stable for discharge from the hospital within 2 midnights of admission.   Author: Daved JAYSON Pump, DO 08/30/2024 3:09 PM  For on call review [email or web address].

## 2024-08-30 NOTE — ED Notes (Signed)
 Patient transported to CT

## 2024-08-31 ENCOUNTER — Observation Stay

## 2024-08-31 DIAGNOSIS — J441 Chronic obstructive pulmonary disease with (acute) exacerbation: Secondary | ICD-10-CM | POA: Diagnosis not present

## 2024-08-31 MED ORDER — SIMETHICONE 80 MG PO CHEW
80.0000 mg | CHEWABLE_TABLET | Freq: Four times a day (QID) | ORAL | Status: DC | PRN
  Administered 2024-09-01 – 2024-09-02 (×3): 80 mg via ORAL
  Filled 2024-08-31 (×4): qty 1

## 2024-08-31 MED ORDER — BUDESONIDE-FORMOTEROL FUMARATE 160-4.5 MCG/ACT IN AERO
2.0000 | INHALATION_SPRAY | Freq: Two times a day (BID) | RESPIRATORY_TRACT | Status: DC
  Administered 2024-08-31 – 2024-09-02 (×5): 2 via RESPIRATORY_TRACT
  Filled 2024-08-31: qty 6

## 2024-08-31 MED ORDER — OXYCODONE HCL 5 MG PO TABS
5.0000 mg | ORAL_TABLET | ORAL | Status: DC | PRN
  Administered 2024-08-31 – 2024-09-01 (×3): 5 mg via ORAL
  Filled 2024-08-31 (×3): qty 1

## 2024-08-31 MED ORDER — ALBUTEROL SULFATE HFA 108 (90 BASE) MCG/ACT IN AERS
2.0000 | INHALATION_SPRAY | Freq: Four times a day (QID) | RESPIRATORY_TRACT | Status: DC | PRN
  Administered 2024-08-31: 2 via RESPIRATORY_TRACT
  Filled 2024-08-31: qty 8.5

## 2024-08-31 NOTE — Evaluation (Signed)
 Occupational Therapy Evaluation Patient Details Name: Cindy Gutierrez MRN: [id number] DOB: 1961/03/22 Today's Date: 08/31/2024   History of Present Illness   Cindy Gutierrez is a 63 y.o. female with medical history significant of HFrEF, CAD, Pulmonary HTN, neuropathy, COPD/chronic hypoxic respiratory failure on 2 L nasal cannula presents to the emergency department for evaluation of nonradiating, constant, left-sided rib pain and shortness of breath that started last night.  Pain is worse with breathing, movement, and palpation.  She also reports nonproductive cough and fever. Of note, she fell backwards onto her bottom while standing on a chair trying to change a light bulb on Wednesday.  She was evaluated in the emergency department at that time and was found to have a right wrist fracture which was splinted.  She reports her pain from the fall is uncontrolled, and she is having difficulty functioning at home.  No focal weakness, numbness, tingling.     Clinical Impressions Pt was seen for OT evaluation and co-tx with PT this date. Prior to hospital admission, pt was mod indep with ADL and mobility, limited endurance. Pt lives with 2 of her 4 adult sons. Pt presents with deficits in strength, balance, activity tolerance, cardiopulmonary status (requiring bump from 2L to 4L with moblity to maintain >90% SpO2) and functional RUE use while splinted/immobilized, affecting safe and optimal ADL completion. Pt currently requires MIN A for ADL transfers, HHA MIN A for ADL mobility, MIN A for STS clothing mgt after toileting, and set up + supv for seated LB perineal bathing. Pt edu in ECS including activity pacing, work simplification, PLB, and AE/DME to improve safety/indep with ADL while minimizing SOB and rib pain with bending during ADL. Pt verbalized understanding and would benefit from skilled OT services to address noted impairments and functional limitations (see below for any additional details) in order  to maximize safety and independence while minimizing future risk of falls, injury, and readmission. Anticipate the need for follow up OT services upon acute hospital DC.    If plan is discharge home, recommend the following:   A little help with walking and/or transfers;A lot of help with bathing/dressing/bathroom;Assistance with cooking/housework;Assist for transportation;Help with stairs or ramp for entrance     Functional Status Assessment   Patient has had a recent decline in their functional status and demonstrates the ability to make significant improvements in function in a reasonable and predictable amount of time.     Equipment Recommendations   None recommended by OT     Recommendations for Other Services         Precautions/Restrictions   Precautions Precautions: Fall Recall of Precautions/Restrictions: Intact Required Braces or Orthoses: Splint/Cast Splint/Cast: R UE Restrictions Other Position/Activity Restrictions: none in chart, but treated as NWB     Mobility Bed Mobility Overal bed mobility: Modified Independent                  Transfers Overall transfer level: Needs assistance Equipment used: 1 person hand held assist Transfers: Sit to/from Stand Sit to Stand: Min assist           General transfer comment: requires cues to scoot to edge of bed, required more than one attempt to power up to standing      Balance Overall balance assessment: Needs assistance Sitting-balance support: Feet supported Sitting balance-Leahy Scale: Good     Standing balance support: Single extremity supported, During functional activity, Reliant on assistive device for balance Standing balance-Leahy Scale: Fair  ADL either performed or assessed with clinical judgement   ADL Overall ADL's : Needs assistance/impaired     Grooming: Sitting;Set up       Lower Body Bathing: Set up;Supervison/  safety;Sitting/lateral leans       Lower Body Dressing: Sit to/from stand;Moderate assistance   Toilet Transfer: Contact guard assist;Minimal Holiday Representative;Ambulation   Toileting- Clothing Manipulation and Hygiene: Minimal assistance Toileting - Clothing Manipulation Details (indicate cue type and reason): MIN A for clothing mgt in standing to pull up underwear over R hip     Functional mobility during ADLs: Contact guard assist;Minimal assistance       Vision         Perception         Praxis         Pertinent Vitals/Pain Pain Assessment Pain Assessment: No/denies pain     Extremity/Trunk Assessment Upper Extremity Assessment Upper Extremity Assessment: Left hand dominant;RUE deficits/detail;Generalized weakness RUE Deficits / Details: immobilized in elbow flexion, pt endorses difficulty raising RUE 2/2 weight of the splint/required LUE to assist RUE: Unable to fully assess due to immobilization RUE Sensation: history of peripheral neuropathy   Lower Extremity Assessment Lower Extremity Assessment: Generalized weakness   Cervical / Trunk Assessment Cervical / Trunk Assessment: Normal   Communication Communication Communication: No apparent difficulties   Cognition Arousal: Alert Behavior During Therapy: WFL for tasks assessed/performed Cognition: No apparent impairments                               Following commands: Intact       Cueing  General Comments   Cueing Techniques: Verbal cues      Exercises Other Exercises Other Exercises: Pt edu in ECS including activity pacing, work simplification, PLB, and AE/DME to improve safety/indep with ADL while minimizing SOB and rib pain with bending during ADL.   Shoulder Instructions      Home Living Family/patient expects to be discharged to:: Private residence Living Arrangements: Children Available Help at Discharge: Family;Available 24 hours/day Type of Home:  Apartment Home Access: Stairs to enter [employer] of Steps: flight Entrance Stairs-Rails: Right;Left Home Layout: One level     Bathroom Shower/Tub: Chief Strategy Officer: Standard Bathroom Accessibility: No   Home Equipment: Educational Psychologist (4 wheels)   Additional Comments: no use of AD      Prior Functioning/Environment Prior Level of Function : Independent/Modified Independent;Needs assist;Driving;History of Falls (last six months)             Mobility Comments: indep, may use rollator for sitting on PRN, just the 1 fall (fell trying to replace a lightbulb) ADLs Comments: children help with meal prep; sit for bathing, indep with med mgt    OT Problem List: Decreased strength;Decreased coordination;Cardiopulmonary status limiting activity;Impaired sensation;Decreased range of motion;Decreased activity tolerance;Decreased safety awareness;Decreased knowledge of use of DME or AE;Impaired balance (sitting and/or standing);Impaired UE functional use   OT Treatment/Interventions: Self-care/ADL training;Therapeutic exercise;Therapeutic activities;Energy conservation;DME and/or AE instruction;Patient/family education;Balance training      OT Goals(Current goals can be found in the care plan section)   Acute Rehab OT Goals Patient Stated Goal: get better and go home OT Goal Formulation: With patient Time For Goal Achievement: 09/14/24 Potential to Achieve Goals: Good ADL Goals Pt Will Perform Upper Body Bathing: sitting;standing;with modified independence Pt Will Perform Lower Body Bathing: sitting/lateral leans;sit to/from stand;with modified independence Pt Will Transfer to Toilet: with modified  independence;ambulating (LRAD) Pt Will Perform Toileting - Clothing Manipulation and hygiene: with modified independence;sitting/lateral leans;sit to/from stand Additional ADL Goal #1: Pt will verbalize plan to implement at least 1 learned falls  prevention/ECS to maximize safety with ADL/IADL and minimize falls risk.   OT Frequency:  Min 2X/week    Co-evaluation PT/OT/SLP Co-Evaluation/Treatment: Yes Reason for Co-Treatment: Necessary to address cognition/behavior during functional activity;For patient/therapist safety;To address functional/ADL transfers PT goals addressed during session: Mobility/safety with mobility;Balance;Proper use of DME OT goals addressed during session: ADL's and self-care;Proper use of Adaptive equipment and DME      AM-PAC OT 6 Clicks Daily Activity     Outcome Measure Help from another person eating meals?: None Help from another person taking care of personal grooming?: None Help from another person toileting, which includes using toliet, bedpan, or urinal?: A Little Help from another person bathing (including washing, rinsing, drying)?: A Little Help from another person to put on and taking off regular upper body clothing?: A Little Help from another person to put on and taking off regular lower body clothing?: A Little 6 Click Score: 20   End of Session Nurse Communication: Mobility status  Activity Tolerance: Patient tolerated treatment well Patient left: in bed;with call bell/phone within reach;with bed alarm set  OT Visit Diagnosis: Unsteadiness on feet (R26.81);Muscle weakness (generalized) (M62.81)                Time: 8587-8557 OT Time Calculation (min): 30 min Charges:  OT General Charges $OT Visit: 1 Visit OT Evaluation $OT Eval Low Complexity: 1 Low OT Treatments $Self Care/Home Management : 8-22 mins  Warren SAUNDERS., MPH, MS, OTR/L ascom [phone] 08/31/24, 3:26 PM

## 2024-08-31 NOTE — Progress Notes (Signed)
 PT Cancellation Note  Patient Details Name: Cindy Gutierrez MRN: [id number] DOB: 05-15-1961   Cancelled Treatment:    Reason Eval/Treat Not Completed: Patient declined, no reason specified States she did not sleep at all, wants a bath and a nap prior to PT evaluation. Will return this pm to see patient.   Emonie Espericueta 08/31/2024, 9:50 AM

## 2024-08-31 NOTE — Plan of Care (Signed)

## 2024-08-31 NOTE — Progress Notes (Signed)
 Progress Note   Patient: Cindy Gutierrez FMW:[id number] DOB: 13-Oct-1960 DOA: 08/30/2024     0 DOS: the patient was seen and examined on 08/31/2024   Brief hospital course:  Cindy Gutierrez is a 63 y.o. female with medical history significant of HFrEF, CAD, Pulmonary HTN, neuropathy, COPD/chronic hypoxic respiratory failure on 2 L nasal cannula presents to the emergency department for evaluation of nonradiating, constant, left-sided rib pain and shortness of breath that started last night.  Pain is worse with breathing, movement, and palpation.  She also reports nonproductive cough and fever.  Of note, she fell backwards onto her bottom while standing on a chair trying to change a light bulb on Wednesday.  She was evaluated in the emergency department at that time and was found to have a right wrist fracture which was splinted.  She reports her pain from the fall is uncontrolled, and she is having difficulty functioning at home.  No focal weakness, numbness, tingling.    Per EMS she was saturating 88% on room air.  She was given albuterol  DuoNebs and Solu-Medrol  and route.  Here she is saturating mid 90% on 3 L nasal cannula.  She did have a fever of 100.4, with a white blood cell count of 11.9.  She is nontachycardic.COVID flu and RSV are negative. Troponin flat.  CT chest abdomen and pelvis noted a small dependent nodular consolidation in the right lower lobe favored to represent atelectasis or scarring, but developing bronchopneumonia is a consideration.       Assessment and Plan:  COPD exacerbation  Acute on chronic hypoxic respiratory failure ?RLL pneumonia  - with rib pain from recent fall and fever, possible RLL pneumonia on CT.  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% - New diagnosis last month, underwent left heart cath at that time, no significant CAD - Appears euvolemic - Continue metoprolol , spironolactone  and furosemide    Fall  R Wrist fx Left lateral chest wall pain - PT evaluation  - maintain right wrist splint  - Oxycodone  as needed for pain - Left rib series to rule out rib fractures       Subjective: Continues to complain of pain in the left lateral chest wall and left upper quadrant  Physical Exam: Vitals:   08/30/24 1638 08/30/24 2008 08/31/24 0406 08/31/24 0822  BP: (!) 158/55 (!) 133/50 (!) 143/50 (!) 132/48  Pulse: 98 78 72 69  Resp: 17 16 16 20   Temp: 98.3 F (36.8 C) 98.2 F (36.8 C) 97.9 F (36.6 C) 100 F (37.8 C)  TempSrc:    Oral  SpO2: 96% 96% 98% 98%  Weight:      Height:       Constitutional:      General: She is not in acute distress.    Appearance: She is not ill-appearing or toxic-appearing.  HENT:     Head: Normocephalic.  Eyes:     Pupils: Pupils are equal, round, and reactive to light.  Cardiovascular:     Rate and Rhythm: Normal rate and regular rhythm.  Pulmonary:     Effort: Pulmonary effort is normal.  Breath sounds: Decreased breath sounds present. No wheezing.  Chest:     Chest wall: Tenderness (TTP left lower lateral rib cage) present.  Abdominal:     Palpations: Abdomen is soft.  Musculoskeletal:     Cervical back: Neck supple.     Right lower leg: No edema.     Left lower leg: No edema.  Skin:    General: Skin is warm and dry.     Comments: Bruising lateral right lower shin above the ankle, no ankle effusion   Neurological:     General: No focal deficit present.     Mental Status: She is alert and oriented to person, place, and time.  Psychiatric:        Mood and Affect: Mood normal.        Behavior: Behavior normal.    Data  Reviewed: Labs reviewed.  Troponin 38 >> 35 BNP 824,, white count 11.9 Labs reviewed  Family Communication: Plan of care discussed with patient at the bedside.  Disposition: Status is: Observation The patient remains OBS appropriate and will d/c before 2 midnights.  Planned Discharge Destination: TBD    Time spent: 45  minutes  Author: Aimee Somerset, MD 08/31/2024 12:19 PM  For on call review [email or web address].

## 2024-08-31 NOTE — Evaluation (Signed)
 Physical Therapy Evaluation Patient Details Name: Cindy Gutierrez MRN: [id number] DOB: 11-08-1960 Today's Date: 08/31/2024  History of Present Illness  Cindy Gutierrez is a 63 y.o. female with medical history significant of HFrEF, CAD, Pulmonary HTN, neuropathy, COPD/chronic hypoxic respiratory failure on 2 L nasal cannula presents to the emergency department for evaluation of nonradiating, constant, left-sided rib pain and shortness of breath that started last night.  Pain is worse with breathing, movement, and palpation.  She also reports nonproductive cough and fever. Of note, she fell backwards onto her bottom while standing on a chair trying to change a light bulb on Wednesday.  She was evaluated in the emergency department at that time and was found to have a right wrist fracture which was splinted.  She reports her pain from the fall is uncontrolled, and she is having difficulty functioning at home.  No focal weakness, numbness, tingling.  Clinical Impression  Patient received in bed, she is agreeable to PT/OT assessment. Patient is limited by endurance throughout session. Received on 2 liters, but increased to 4 liters with mobility to maintain O2 sats > 90%. Patient requires cues for breathing throughout session. She needs min A for bed mobility and to stand with cues and increased effort. SHe was able to ambulate ~ 40 feet with hand held assist. She will continue to benefit from skilled PT to improve endurance, strength and safety with mobility.                                If plan is discharge home, recommend the following: A little help with walking and/or transfers;A little help with bathing/dressing/bathroom;Help with stairs or ramp for entrance;Assist for transportation   Can travel by private vehicle    Yes with assist    Equipment Recommendations Cane  Recommendations for Other Services       Functional Status Assessment Patient has had a recent decline in their functional  status and demonstrates the ability to make significant improvements in function in a reasonable and predictable amount of time.     Precautions / Restrictions Precautions Precautions: Fall Recall of Precautions/Restrictions: Intact Required Braces or Orthoses: Splint/Cast Splint/Cast: R UE Restrictions Other Position/Activity Restrictions: none in chart, but treated as NWB      Mobility  Bed Mobility Overal bed mobility: Modified Independent                  Transfers Overall transfer level: Needs assistance Equipment used: 1 person hand held assist Transfers: Sit to/from Stand Sit to Stand: Min assist           General transfer comment: requires cues to scoot to edge of bed, required more than one attempt to power up to standing    Ambulation/Gait Ambulation/Gait assistance: Contact guard assist, Min assist Gait Distance (Feet): 40 Feet Assistive device: 1 person hand held assist Gait Pattern/deviations: Step-through pattern, Decreased step length - right, Decreased step length - left, Decreased stride length Gait velocity: decr     General Gait Details: hesitancy and min A needed with ambulation. Patient sob during mobility with O2 sats down to 85% on 3 liters. Bumped up to 4 liters with ambulation and maintained around 90%.  Stairs            Wheelchair Mobility     Tilt Bed    Modified Rankin (Stroke Patients Only)       Balance Overall balance assessment:  Needs assistance Sitting-balance support: Feet supported Sitting balance-Leahy Scale: Good     Standing balance support: Single extremity supported, During functional activity, Reliant on assistive device for balance Standing balance-Leahy Scale: Fair                               Pertinent Vitals/Pain Pain Assessment Pain Assessment: No/denies pain    Home Living Family/patient expects to be discharged to:: Private residence Living Arrangements: Children Available  Help at Discharge: Family;Available 24 hours/day Type of Home: Apartment Home Access: Stairs to enter Entrance Stairs-Rails: Doctor, General Practice of Steps: flight   Home Layout: One level Home Equipment: Educational Psychologist (4 wheels) Additional Comments: no use of AD    Prior Function Prior Level of Function : Independent/Modified Independent;Needs assist;Driving;History of Falls (last six months)             Mobility Comments: indep, may use rollator for sitting on PRN, just the 1 fall (fell trying to replace a lightbulb) ADLs Comments: children help with meal prep; sit for bathing, indep with med mgt     Extremity/Trunk Assessment   Upper Extremity Assessment Upper Extremity Assessment: Defer to OT evaluation RUE Deficits / Details: immobilized in elbow flexion RUE Sensation: history of peripheral neuropathy    Lower Extremity Assessment Lower Extremity Assessment: Generalized weakness    Cervical / Trunk Assessment Cervical / Trunk Assessment: Normal  Communication   Communication Communication: No apparent difficulties    Cognition Arousal: Alert Behavior During Therapy: WFL for tasks assessed/performed   PT - Cognitive impairments: No apparent impairments                         Following commands: Intact       Cueing Cueing Techniques: Verbal cues     General Comments      Exercises     Assessment/Plan    PT Assessment Patient needs continued PT services  PT Problem List Decreased strength;Decreased activity tolerance;Decreased balance;Decreased mobility;Decreased knowledge of precautions;Decreased knowledge of use of DME;Decreased skin integrity;Impaired sensation       PT Treatment Interventions DME instruction;Gait training;Stair training;Functional mobility training;Therapeutic activities;Therapeutic exercise;Balance training;Neuromuscular re-education;Patient/family education    PT Goals (Current goals can be found  in the Care Plan section)  Acute Rehab PT Goals Patient Stated Goal: return home PT Goal Formulation: With patient Time For Goal Achievement: 09/13/24 Potential to Achieve Goals: Good    Frequency Min 2X/week     Co-evaluation PT/OT/SLP Co-Evaluation/Treatment: Yes Reason for Co-Treatment: Necessary to address cognition/behavior during functional activity;For patient/therapist safety;To address functional/ADL transfers PT goals addressed during session: Mobility/safety with mobility;Balance;Proper use of DME         AM-PAC PT 6 Clicks Mobility  Outcome Measure Help needed turning from your back to your side while in a flat bed without using bedrails?: A Little Help needed moving from lying on your back to sitting on the side of a flat bed without using bedrails?: A Little Help needed moving to and from a bed to a chair (including a wheelchair)?: A Little Help needed standing up from a chair using your arms (e.g., wheelchair or bedside chair)?: A Lot Help needed to walk in hospital room?: A Little Help needed climbing 3-5 steps with a railing? : A Lot 6 Click Score: 16    End of Session Equipment Utilized During Treatment: Oxygen Activity Tolerance: Patient limited by fatigue;Other (comment) (sob)  Patient left: in bed;with call bell/phone within reach;with bed alarm set Nurse Communication: Mobility status PT Visit Diagnosis: Other abnormalities of gait and mobility (R26.89);Unsteadiness on feet (R26.81);History of falling (Z91.81);Difficulty in walking, not elsewhere classified (R26.2);Muscle weakness (generalized) (M62.81)    Time: 8582-8557 PT Time Calculation (min) (ACUTE ONLY): 25 min   Charges:   PT Evaluation $PT Eval Moderate Complexity: 1 Mod   PT General Charges $$ ACUTE PT VISIT: 1 Visit         Brandilyn Nanninga, PT, GCS 08/31/24,3:17 PM

## 2024-09-01 DIAGNOSIS — S2249XA Multiple fractures of ribs, unspecified side, initial encounter for closed fracture: Secondary | ICD-10-CM | POA: Insufficient documentation

## 2024-09-01 DIAGNOSIS — J441 Chronic obstructive pulmonary disease with (acute) exacerbation: Secondary | ICD-10-CM | POA: Diagnosis not present

## 2024-09-01 LAB — BASIC METABOLIC PANEL WITH GFR
Anion gap: 7 (ref 5–15)
BUN: 24 mg/dL — ABNORMAL HIGH (ref 8–23)
CO2: 28 mmol/L (ref 22–32)
Calcium: 8.6 mg/dL — ABNORMAL LOW (ref 8.9–10.3)
Chloride: 108 mmol/L (ref 98–111)
Creatinine, Ser: 0.8 mg/dL (ref 0.44–1.00)
GFR, Estimated: >60 mL/min (ref >60)
Glucose, Bld: 107 mg/dL — ABNORMAL HIGH (ref 70–99)
Potassium: 4.9 mmol/L (ref 3.5–5.1)
Sodium: 143 mmol/L (ref 135–145)

## 2024-09-01 MED ORDER — POLYETHYLENE GLYCOL 3350 17 G PO PACK
17.0000 g | PACK | Freq: Every day | ORAL | Status: DC
  Administered 2024-09-02: 17 g via ORAL
  Filled 2024-09-01 (×2): qty 1

## 2024-09-01 MED ORDER — TRAMADOL HCL 50 MG PO TABS
100.0000 mg | ORAL_TABLET | Freq: Four times a day (QID) | ORAL | Status: DC | PRN
  Administered 2024-09-01 – 2024-09-02 (×5): 100 mg via ORAL
  Filled 2024-09-01 (×5): qty 2

## 2024-09-01 MED ORDER — DIPHENHYDRAMINE HCL 50 MG/ML IJ SOLN: 12.5000 mg | Freq: Once | INTRAMUSCULAR | Status: AC

## 2024-09-01 MED ORDER — SENNOSIDES-DOCUSATE SODIUM 8.6-50 MG PO TABS
2.0000 | ORAL_TABLET | Freq: Every day | ORAL | Status: DC
  Administered 2024-09-01: 2 via ORAL
  Filled 2024-09-01: qty 2

## 2024-09-01 MED ORDER — DIPHENHYDRAMINE HCL 25 MG PO CAPS
25.0000 mg | ORAL_CAPSULE | Freq: Once | ORAL | Status: AC
  Administered 2024-09-01: 25 mg via ORAL
  Filled 2024-09-01: qty 1

## 2024-09-01 NOTE — Progress Notes (Signed)
 Progress Note   Patient: Cindy Gutierrez FMW:[id number] DOB: [birth year] DOA: 08/30/2024     0 DOS: the patient was seen and examined on 09/01/2024   Brief hospital course:  Cindy Gutierrez is a 63 y.o. female with medical history significant of HFrEF, CAD, Pulmonary HTN, neuropathy, COPD/chronic hypoxic respiratory failure on 2 L nasal cannula presents to the emergency department for evaluation of nonradiating, constant, left-sided rib pain and shortness of breath that started last night.  Pain is worse with breathing, movement, and palpation.  She also reports nonproductive cough and fever.  Of note, she fell backwards onto her bottom while standing on a chair trying to change a light bulb on Wednesday.  She was evaluated in the emergency department at that time and was found to have a right wrist fracture which was splinted.  She reports her pain from the fall is uncontrolled, and she is having difficulty functioning at home.  No focal weakness, numbness, tingling.    Per EMS she was saturating 88% on room air.  She was given albuterol  DuoNebs and Solu-Medrol  and route.  Here she is saturating mid 90% on 3 L nasal cannula.  She did have a fever of 100.4, with a white blood cell count of 11.9.  She is nontachycardic.COVID flu and RSV are negative. Troponin flat.  CT chest abdomen and pelvis noted a small dependent nodular consolidation in the right lower lobe favored to represent atelectasis or scarring, but developing bronchopneumonia is a consideration.      Assessment and Plan:  COPD exacerbation  Acute on chronic hypoxic respiratory failure ?RLL pneumonia  - with rib pain from recent fall and fever, possible RLL pneumonia on CT.  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  Orthopedic surgery consulted - Tramadol as needed for pain - Left rib series shows acute minimally displaced left anterolateral 8th and 9th rib fracture.  Chronic left posterior lateral 6-8 rib fractures --Incentive spirometry --Lidoderm  patch          Subjective: Continues to complain of pain in the left lateral chest wall  Physical Exam: Vitals:   08/31/24 2029 09/01/24 0337 09/01/24 0420 09/01/24 0801  BP: (!) 121/52 (!) 127/49 (!) 155/54 (!) 140/48  Pulse: 64 (!) 57 64 (!) 51  Resp: 16 14 16 19   Temp: 98.4 F (36.9 C) 98.1 F (36.7 C) 98 F (36.7 C) 98.3 F (36.8 C)  TempSrc:      SpO2: 96% 98% 100% 100%  Weight:      Height:       Constitutional:      General: She is not in acute distress.    Appearance: She is not ill-appearing or toxic-appearing.  HENT:     Head: Normocephalic.  Eyes:     Pupils: Pupils are equal, round, and reactive to light.  Cardiovascular:     Rate and Rhythm: Normal rate and regular rhythm.  Pulmonary:     Effort: Pulmonary effort is normal.     Breath sounds: Decreased breath sounds present. No wheezing.  Chest:     Chest wall: Tenderness (TTP left lower lateral rib cage) present.  Abdominal:     Palpations: Abdomen is soft.  Musculoskeletal:     Cervical back: Neck supple.     Right lower leg: No edema.     Left lower leg: No edema.  Skin:    General: Skin is warm and dry.     Comments: Bruising lateral right lower shin above the ankle, no ankle effusion   Neurological:     General: No focal  deficit present.     Mental Status: She is alert and oriented to person, place, and time.  Psychiatric:        Mood and Affect: Mood normal.        Behavior: Behavior normal.         Data Reviewed:  There are no new results to review at this time.  Family Communication: Plan of care discussed with patient in detail.  She verbalizes understanding and agrees with the plan  Disposition: Status is: Observation The patient remains OBS appropriate and will d/c before 2 midnights.  Planned Discharge Destination: Home with Home Health    Time spent: 40 minutes  Author: Aimee Somerset, MD 09/01/2024 10:14 AM  For on call review [email or web address].

## 2024-09-01 NOTE — TOC Initial Note (Signed)
 Transition of Care [facility]) - Initial/Assessment Note    Patient Details  Name: Cindy Gutierrez MRN: [id number] Date of Birth: [birth year]  Transition [facility] Care [facility]) CM/SW Contact:    Victory Jackquline RAMAN, RN Phone Number: 09/01/2024, 3:27 PM  Clinical Narrative:   RNCM met with patient at bedside. RNCM introduced role and explained that discharge planning would be discussed. PT is recommending HH/PT. Pt in agreement with HH/PT. Patient asked if the MD would also order RN and Aide. States that she needs help with bathing and getting dressed. Can't do much reaching or bending has a few broken ribs.  MD made aware. HH/PT referral sent via EPIC. 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 One son is disable.Her son takes her to her appointments and will be picking her up at the time of discharge. RNCM will continue to follow for discharge planning needs.    Expected Discharge Plan: Home w Home Health Services Barriers to Discharge: Continued Medical Work up   Patient Goals and CMS Choice            Expected Discharge Plan and Services       Living arrangements for the past 2 months: Single Family Home                           HH Arranged: OT HH Agency: [facility] Date [facility] Agency Contacted: 09/01/24   Representative spoke with at [facility] Agency: Referral sent via EPIC  Prior Living Arrangements/Services Living arrangements for the past 2 months: Single Family Home Lives with:: Self, Adult Children Patient language and need for interpreter reviewed:: Yes Do you feel safe going back to the place where you live?: Yes      Need for Family Participation in Patient Care: Yes (Comment) Care giver support system in place?: Yes (comment) Current home services: DME Criminal Activity/Legal Involvement Pertinent to Current Situation/Hospitalization: No - Comment as needed  Activities of Daily Living   ADL Screening (condition at time of  admission) Independently performs ADLs?: Yes (appropriate for developmental age) Is the patient deaf or have difficulty hearing?: No Does the patient have difficulty seeing, even when wearing glasses/contacts?: No Does the patient have difficulty concentrating, remembering, or making decisions?: No  Permission Sought/Granted                  Emotional Assessment Appearance:: Appears stated age, Well-Groomed Attitude/Demeanor/Rapport: Engaged, Self-Confident, Gracious Affect (typically observed): Accepting, Quiet, Pleasant, Calm Orientation: : Oriented to Self, Oriented to Place, Oriented to  Time, Oriented to Situation Alcohol / Substance Use: Not Applicable Psych Involvement: No (comment)  Admission diagnosis:  Shortness of breath [R06.02] Chest wall pain [R07.89] Lung nodule [R91.1] Thyroid  goiter [E04.9] COPD exacerbation (HCC) [J44.1] Pneumonia due to infectious organism, unspecified laterality, unspecified part of lung [J18.9] Left upper quadrant abdominal pain [R10.12] Acute cough [R05.1] Patient Active Problem List   Diagnosis Date Noted   Multiple rib fractures 09/01/2024   COPD exacerbation (HCC) 08/30/2024   Stress-induced cardiomyopathy 07/27/2024   HFrEF (heart failure with reduced ejection fraction) (HCC) 07/27/2024   COPD (chronic obstructive pulmonary disease) (HCC) 07/22/2024   Overweight (BMI 25.0-29.9) 04/17/2024   Hypertensive urgency 04/15/2024   Acute on chronic diastolic CHF (congestive heart failure) (HCC) 04/15/2024   Acute respiratory failure with hypoxia and hypercarbia (HCC) 04/14/2024   COVID-19 virus infection 12/05/2023   Cough 12/05/2023   Elevated  troponin 12/05/2023   Acute hypoxic respiratory failure (HCC) 11/18/2023   Acute respiratory failure (HCC) 10/16/2023   NSTEMI (non-ST elevated myocardial infarction) (HCC) 10/16/2023   SIRS (systemic inflammatory response syndrome) (HCC) 10/16/2023   Hypertensive emergency 10/16/2023    Dyslipidemia 09/18/2023   Peripheral neuropathy 09/18/2023   Hypokalemia 09/18/2023   Severe sepsis (HCC) 09/24/2022   Influenza A with pneumonia 09/23/2022   Nicotine  dependence 09/23/2022   Onychomycosis 09/23/2022   Hyperglycemia 09/23/2022   Lactic acidosis 09/23/2022   Acute on chronic respiratory failure with hypoxia (HCC) 05/23/2021   Aortic valve regurgitation 05/19/2021   Depression 05/19/2021   Primary hyperparathyroidism 05/19/2021   Lymphedema, not elsewhere classified 05/19/2021   Major depressive disorder, single episode, 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/19/2021   Other chest pain 05/19/2021   Pulmonary nodule 05/19/2021   Viral hepatitis C 04/02/2021   Colon cancer screening    Heart murmur 10/20/2017   COPD with acute exacerbation (HCC) 06/13/2017   Essential hypertension 06/13/2017   Tobacco use 06/13/2017   Herpes simplex 05/31/2017   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   Lipoma 05/30/2017   PCP:  Administration, [facility]:   [id number] - [place], [place] - [address] [place] [address] Phone: [phone] Fax: [phone]  [facility] [place], [place] - [address] [place] [address] Phone: [phone] Fax: [phone]  [id number], [place] - [address] AT [facility] [address] [place] [address] Phone: [phone] Fax: [phone]  [facility] [address] [place] [address] Phone: [phone] Fax: [phone]     Social Drivers of Health (SDOH) Social History: SDOH Screenings   Food Insecurity: No Food Insecurity (08/30/2024)  Housing: Low Risk  (08/30/2024)  Transportation Needs: No Transportation Needs (08/30/2024)  Utilities: Not At Risk (08/30/2024)  Depression (PHQ2-9): High Risk (09/08/2022)  Financial Resource Strain: High Risk (04/25/2024)   Received from [facility]  Social Connections: Moderately Isolated (07/22/2024)  Tobacco Use: Medium Risk (08/30/2024)   SDOH Interventions:     Readmission Risk Interventions    07/24/2024   12:15 PM 04/18/2024   10:30 AM  Readmission Risk Prevention Plan  Transportation Screening Complete Complete  PCP or Specialist Appt within 3-5 Days Complete Complete  Social Work Consult for Recovery Care Planning/Counseling Complete   Palliative Care Screening Not Applicable   Medication Review Oceanographer) Complete Complete

## 2024-09-01 NOTE — Consult Note (Signed)
 ORTHOPAEDIC CONSULTATION  REQUESTING PHYSICIAN: Lanetta Lingo, MD  Chief Complaint:   Right wrist pain.  History of Present Illness: Cindy Gutierrez is a 63 y.o. female with a history of congestive heart failure, COPD, coronary artery disease, aortic valve regurgitation, mitral valve regurgitation, hep C, hyperparathyroidism, hypertension, and pulmonary hypertension who normally lives independently.  Apparently she was in her usual state of health 4 days ago when she stood up on a kitchen chair to try to change a light bulb then lost her balance and fell backwards onto her outstretched right hand.  She presented to the emergency room where x-rays demonstrated an impacted right distal radius fracture with overall satisfactory alignment.  The patient was placed into a sugar-tong splint and encouraged to follow-up with orthopedics.  Apparently, the patient also sustained several left lower rib fractures.  Over the next several days, she had progressive difficulty with her breathing, so she returned to the emergency room yesterday afternoon and subsequently was admitted for a COPD exacerbation.  The patient continues to note discomfort in her right wrist.  However, her more pressing concern is the bulkiness of her sugar-tong splint which is preventing her from mobilizing well.  She denies any new numbness or paresthesias to her fingers, but notes that she has been diagnosed with neuropathy  of her right upper extremity and hand.  She is left hand dominant.  Past Medical History:  Diagnosis Date   Aortic valve regurgitation    CHF (congestive heart failure) (HCC)    COPD (chronic obstructive pulmonary disease) (HCC)    Coronary artery disease    Depression    Hepatitis C    Hyperparathyroidism    Hypertension    Mitral valve regurgitation    Neuropathy    to R arm only   Non-toxic multinodular goiter    Pulmonary hypertension (HCC)  2022   noted on CT   Pulmonary nodule    Thyroid  disease    Past Surgical History:  Procedure Laterality Date   COLONOSCOPY WITH PROPOFOL  N/A 08/21/2018   Procedure: COLONOSCOPY WITH PROPOFOL ;  Surgeon: Unk Corinn Skiff, MD;  Location: [facility] ENDOSCOPY;  Service: Gastroenterology;  Laterality: N/A;   LEFT HEART CATH AND CORONARY ANGIOGRAPHY N/A 07/26/2024   Procedure: LEFT HEART CATH AND CORONARY ANGIOGRAPHY;  Surgeon: Florencio Cara JONETTA, MD;  Location: [facility] INVASIVE CV LAB;  Service: Cardiovascular;  Laterality: N/A;   PARATHYROIDECTOMY Left    TUBAL LIGATION     Social History   Socioeconomic History   Marital status: Single    Spouse name: Not on file   Number of children: Not on file   Years of education: Not on file   Highest education level: Not on file  Occupational History   Not on file  Tobacco Use   Smoking status: Former    Types: Cigarettes    Start date: 2023    Quit date: 12/04/1993    Years since quitting: 30.7   Smokeless tobacco: Never  Vaping Use   Vaping status: Never Used  Substance and Sexual Activity   Alcohol use: Never   Drug use: Never   Sexual activity: Not Currently  Other Topics Concern   Not on file  Social History Narrative   ** Merged History Encounter **       Social Drivers of Health   Financial Resource Strain: High Risk (04/25/2024)   [facility]   Overall Financial Resource Strain (CARDIA)    Difficulty of Paying  Living Expenses: Hard  Food Insecurity: No Food Insecurity (08/30/2024)   Hunger Vital Sign    Worried About Running Out of Food in the Last Year: Never true    Ran Out of Food in the Last Year: Never true  Transportation Needs: No Transportation Needs (08/30/2024)   PRAPARE - Administrator, Civil Service (Medical): No    Lack of Transportation (Non-Medical): No  Physical Activity: Not on file  Stress: Not on file  Social Connections: Moderately Isolated (07/22/2024)    Social Connection and Isolation Panel    Frequency of Communication with Friends and Family: More than three times a week    Frequency of Social Gatherings with Friends and Family: More than three times a week    Attends Religious Services: More than 4 times per year    Active Member of [employer] or Organizations: No    Attends Engineer, Structural: Never    Marital Status: Never married   Family History  Problem Relation Age of Onset   Alzheimer's disease Mother    Heart disease Father    Allergies  Allergen Reactions   Sulfa Antibiotics Hives and Rash   Bee Pollen Hives   Hydrochlorothiazide      Chest tightness   Lisinopril Nausea Only   Losartan     Penicillins Hives   Tiotropium Bromide     Other reaction(s): Cough   Carvedilol    Prior to Admission medications   Medication Sig Start Date End Date Taking? Authorizing Provider  acetaminophen  (TYLENOL ) 325 MG tablet Take 650 mg by mouth 3 (three) times daily as needed for mild pain (pain score 1-3).   Yes [provider]  albuterol  (PROVENTIL ) (2.5 MG/3ML) 0.083% nebulizer solution Take 2.5 mg by nebulization every 4 (four) hours as needed for wheezing or shortness of breath.   Yes [provider]  albuterol  (VENTOLIN  HFA) 108 (90 Base) MCG/ACT inhaler Inhale 2 puffs into the lungs every 6 (six) hours as needed for wheezing or shortness of breath. 10/19/23  Yes Wouk, Devaughn Sayres, MD  aspirin  81 MG chewable tablet Chew 81 mg by mouth daily. 04/13/22  Yes [provider]  budesonide -formoterol  (SYMBICORT ) 160-4.5 MCG/ACT inhaler Inhale 2 puffs into the lungs 2 (two) times daily.   Yes [provider]  calcium  carbonate (TUMS - DOSED IN MG ELEMENTAL CALCIUM ) 500 MG chewable tablet Chew 1 tablet by mouth daily. As needed   Yes [provider]  celecoxib  (CELEBREX ) 200 MG capsule Take 200 mg by mouth 2 (two) times daily.   Yes [provider]  escitalopram  (LEXAPRO ) 10 MG tablet  Take 10 mg by mouth daily. 09/25/23  Yes [provider]  furosemide  (LASIX ) 20 MG tablet Take 1 tablet (20 mg total) by mouth daily. 04/18/24  Yes Wieting, Richard, MD  metoprolol  succinate (TOPROL -XL) 25 MG 24 hr tablet Take 0.5 tablets (12.5 mg total) by mouth daily. 07/28/24  Yes Wouk, Devaughn Sayres, MD  pregabalin  (LYRICA ) 150 MG capsule Take 1 capsule (150 mg total) by mouth 2 (two) times daily. 07/27/24  Yes Wouk, Devaughn Sayres, MD  spironolactone  (ALDACTONE ) 25 MG tablet Take 1 tablet (25 mg total) by mouth daily. 04/19/24  Yes Wieting, Richard, MD  isosorbide -hydrALAZINE  (BIDIL ) 20-37.5 MG tablet Take 1 tablet by mouth 3 (three) times daily. Patient not taking: Reported on 08/30/2024 07/27/24   Kandis Devaughn Sayres, MD  ondansetron  (ZOFRAN -ODT) 4 MG disintegrating tablet Take 1 tablet (4 mg total) by mouth every 8 (  eight) hours as needed. Patient not taking: Reported on 08/30/2024 08/28/24   Margrette Monte A, PA-C  oxyCODONE  (ROXICODONE ) 5 MG immediate release tablet Take 1 tablet (5 mg total) by mouth every 8 (eight) hours as needed. Patient not taking: Reported on 08/30/2024 08/28/24 08/28/25  Margrette Monte A, PA-C  rosuvastatin  (CRESTOR ) 20 MG tablet Take 20 mg by mouth daily. Patient not taking: Reported on 08/30/2024    [provider]   DG Ribs Unilateral Left Result Date: 08/31/2024 EXAM: 1 VIEW XRAY OF THE LEFT RIBS 08/31/2024 10:49:00 AM COMPARISON: None available. CLINICAL HISTORY: Pain FINDINGS: BONES: Acute minimally displaced left anterolateral 8th and 9th rib fractures. Chronic left posterolateral 6th-8th rib fractures. LUNGS AND PLEURA: Visualized lungs demonstrate no acute abnormality. HEART AND MEDIASTINUM: Cardiomegaly. SOFT TISSUES: Surgical clips in neck. IMPRESSION: 1. Acute minimally displaced  left anterolateral 8th and 9th rib fractures. Electronically signed by: Norman Gatlin MD 08/31/2024 06:54 PM EST RP Workstation: [id number]    Positive ROS: All  other systems have been reviewed and were otherwise negative with the exception of those mentioned in the HPI and as above.  Physical Exam: General:  Alert, no acute distress Psychiatric:  Patient is competent for consent with normal mood and affect   Cardiovascular:  No pedal edema Respiratory:  No wheezing, non-labored breathing GI:  Abdomen is soft and non-tender Skin:  No lesions in the area of chief complaint Neurologic:  Sensation intact distally Lymphatic:  No axillary or cervical lymphadenopathy  Orthopedic Exam:  Orthopedic examination is limited to the right wrist and hand. There is moderate swelling around the wrist and hane with evolving ecchymosis extending to the index and long fingers.  She experiences moderate to severe pain to palpation over the distal radius, as well as with any active or passive motion of the wrist.  She is grossly neurovascularly intact to the right hand.  X-rays:  Recent AP, lateral, and oblique views of the right wrist are available for review and have been reviewed by myself.  These films demonstrate an impacted right distal radius fracture with intra-articular extension but overall satisfactory maintenance of reduction.  The radial inclination and radial length appear to be well-maintained, although she has lost some volar tilt.  No significant degenerative changes of the wrist are noted at this time.  Assessment: Impacted intra-articular right distal radius fracture.  Plan: The treatment options have been discussed with the patient.  At this point, I feel that the patient's wrist is sufficiently well aligned that no surgical intervention is presently required.  For her comfort, the patient's sugar-tong splint is removed and she is placed into a Velcro wrist immobilizer.  She is to wear this at all times, removing it only for bathing purposes.  She is to avoid any weightbearing through the right wrist or hand, but may otherwise be mobilized with physical  therapy using a cane in her left hand or a platform walker if necessary for balance.  Thank you for asking me to participate in the care of this most pleasant and unfortunate woman.  I will be happy to follow her with you.    Cindy Reyes Maltos, MD  Beeper #:  [phone]  09/01/2024 4:43 PM

## 2024-09-01 NOTE — Plan of Care (Signed)
  Problem: Education: Goal: Knowledge of General Education information will improve Description: Including pain rating scale, medication(s)/side effects and non-pharmacologic comfort measures Outcome: Progressing   Problem: Nutrition: Goal: Adequate nutrition will be maintained Outcome: Progressing   Problem: Coping: Goal: Level of anxiety will decrease Outcome: Progressing   Problem: Safety: Goal: Ability to remain free from injury will improve Outcome: Progressing   Problem: Respiratory: Goal: Ability to maintain a clear airway will improve Outcome: Progressing

## 2024-09-02 ENCOUNTER — Other Ambulatory Visit: Payer: Self-pay

## 2024-09-02 DIAGNOSIS — S2249XS Multiple fractures of ribs, unspecified side, sequela: Secondary | ICD-10-CM | POA: Diagnosis not present

## 2024-09-02 DIAGNOSIS — I5181 Takotsubo syndrome: Secondary | ICD-10-CM

## 2024-09-02 DIAGNOSIS — J441 Chronic obstructive pulmonary disease with (acute) exacerbation: Secondary | ICD-10-CM | POA: Diagnosis not present

## 2024-09-02 MED ORDER — CALCIUM CARBONATE ANTACID 500 MG PO CHEW
1.0000 | CHEWABLE_TABLET | Freq: Every day | ORAL | Status: DC
  Administered 2024-09-02: 200 mg via ORAL
  Filled 2024-09-02: qty 1

## 2024-09-02 MED ORDER — FUROSEMIDE 20 MG PO TABS
20.0000 mg | ORAL_TABLET | Freq: Every day | ORAL | Status: DC
  Administered 2024-09-02: 20 mg via ORAL
  Filled 2024-09-02: qty 1

## 2024-09-02 MED ORDER — TRAMADOL HCL 50 MG PO TABS
100.0000 mg | ORAL_TABLET | Freq: Three times a day (TID) | ORAL | 0 refills | Status: AC | PRN
  Filled 2024-09-02: qty 20, 4d supply, fill #0

## 2024-09-02 MED ORDER — CELECOXIB 200 MG PO CAPS
200.0000 mg | ORAL_CAPSULE | Freq: Two times a day (BID) | ORAL | Status: DC
  Administered 2024-09-02: 200 mg via ORAL
  Filled 2024-09-02: qty 1

## 2024-09-02 MED ORDER — PREDNISONE 20 MG PO TABS
20.0000 mg | ORAL_TABLET | Freq: Every day | ORAL | 0 refills | Status: AC
  Filled 2024-09-02: qty 3, 3d supply, fill #0

## 2024-09-02 MED ORDER — LIDOCAINE 5 % EX PTCH
1.0000 | MEDICATED_PATCH | CUTANEOUS | 0 refills | Status: AC
  Filled 2024-09-02: qty 30, 30d supply, fill #0

## 2024-09-02 MED ORDER — AZITHROMYCIN 250 MG PO TABS
250.0000 mg | ORAL_TABLET | Freq: Every day | ORAL | Status: DC
  Administered 2024-09-02: 250 mg via ORAL
  Filled 2024-09-02 (×2): qty 1

## 2024-09-02 MED ORDER — NICOTINE 21 MG/24HR TD PT24
21.0000 mg | MEDICATED_PATCH | Freq: Every day | TRANSDERMAL | 0 refills | Status: AC
  Filled 2024-09-02: qty 28, 28d supply, fill #0

## 2024-09-02 MED ORDER — SPIRONOLACTONE 25 MG PO TABS
25.0000 mg | ORAL_TABLET | Freq: Every day | ORAL | Status: DC
  Administered 2024-09-02: 25 mg via ORAL
  Filled 2024-09-02: qty 1

## 2024-09-02 MED ORDER — AZITHROMYCIN 250 MG PO TABS
ORAL_TABLET | ORAL | 0 refills | Status: AC
  Filled 2024-09-02: qty 3, 3d supply, fill #0

## 2024-09-02 MED ORDER — ESCITALOPRAM OXALATE 10 MG PO TABS
10.0000 mg | ORAL_TABLET | Freq: Every day | ORAL | Status: DC
  Administered 2024-09-02: 10 mg via ORAL
  Filled 2024-09-02: qty 1

## 2024-09-02 NOTE — Plan of Care (Signed)
  Problem: Health Behavior/Discharge Planning: Goal: Ability to manage health-related needs will improve Outcome: Progressing   Problem: Clinical Measurements: Goal: Ability to maintain clinical measurements within normal limits will improve Outcome: Progressing   Problem: Activity: Goal: Risk for activity intolerance will decrease Outcome: Progressing   Problem: Coping: Goal: Level of anxiety will decrease Outcome: Progressing   

## 2024-09-02 NOTE — Discharge Instructions (Signed)
 Smoking cessation advised Use your oxygen as instructed Use your inhalers as before

## 2024-09-02 NOTE — Discharge Summary (Signed)
 Physician Discharge Summary   Patient: Cindy Gutierrez MRN: [id number] DOB: April 01, 1961  Admit date:     08/30/2024  Discharge date: 09/02/24  Discharge Physician: Leita Blanch   [facility]P: [facility]   Recommendations at discharge:   follow-up PCP in 1 to 2 weeks follow-up orthopedic Dr.Poggi in 2-3 weeks for Right Wrist fracture  Discharge Diagnoses: Principal Problem:   COPD exacerbation (HCC) Active Problems:   Acute hypoxic respiratory failure (HCC)   Stress-induced cardiomyopathy   Multiple rib fractures  From H and P Cindy Gutierrez is a 63 y.o. female with medical history significant of HFrEF, CAD, Pulmonary HTN, neuropathy, COPD/chronic hypoxic respiratory failure on 2 L nasal cannula presents to the emergency department for evaluation of nonradiating, constant, left-sided rib pain and shortness of breath that started last night.  Pain is worse with breathing, movement, and palpation.  She also reports nonproductive cough and fever.  Of note, she fell backwards onto her bottom while standing on a chair trying to change a light bulb on Wednesday.  She was evaluated in the emergency department at that time and was found to have a right wrist fracture which was splinted.  She reports her pain from the fall is uncontrolled, and she is having difficulty functioning at home.  No focal weakness, numbness, tingling.   SABRACOVID flu and RSV are negative. Troponin flat.   CT chest abdomen and pelvis noted a small dependent nodular consolidation in the right lower lobe favored to represent atelectasis or scarring, but developing bronchopneumonia is a consideration.    COPD exacerbation  Acute on chronic hypoxic respiratory failure/ ?RLL pneumonia --clinically does not seem like PNA - with rib pain from recent fall and fever, possible RLL pneumonia on CT.  - Patient was noted to be hypoxic on admission with room air pulse oximetry of 88% -Currently  requiring 3 L nasal cannula, her  baseline is 2 L nasal cannula at rest and 3-4 with exertion  - Continue empiric antibiotic therapy  to complete a 5-day course of therapy with po zithromax  - Continue prednisone  and inhaled steroids - Continue scheduled and as needed nebs - Respiratory viral panel is negative -- patient's respiratory status is at baseline   Stress cardiomyopathy HFrEF 40% - New diagnosis last month, underwent left heart cath at that time, no significant CAD - Appears euvolemic - Continue metoprolol , spironolactone  and furosemide    Fall accidental R Wrist fx Left rib fractures - PT evaluation -- home health PT arrange - maintain right wrist splint.  Orthopedic surgery consulted-- patient to follow-up with Dr.Poggi as out pt - Tramadol as needed for pain - Left rib series shows acute minimally displaced left anterolateral 8th and 9th rib fracture.  Chronic left posterior lateral 6-8 rib fractures --Incentive spirometry --Lidoderm  patch  Right Leg blister post fall/bruises diff stages in right LE --Blister bursted --apply foam padding --no s/o cellulitis   overall hemodynamically stable. Patient lives with two sons. Will discharge to home with home health       Pain control - [place]  Controlled Substance Reporting System database was reviewed. and patient was instructed, not to drive, operate heavy machinery, perform activities at heights, swimming or participation in water  activities or provide baby-sitting services while on Pain, Sleep and Anxiety Medications; until their outpatient Physician has advised to do so again. Also recommended to not to take more than prescribed Pain, Sleep and Anxiety Medications.  Consultants: Dr Edie Disposition: Home health Diet recommendation:  Discharge Diet  Orders (From admission, onward)     Start     Ordered   09/02/24 0000  Diet - low sodium heart healthy        09/02/24 0943           Cardiac diet DISCHARGE MEDICATION: Allergies as of  09/02/2024       Reactions   Sulfa Antibiotics Hives, Rash   Bee Pollen Hives   Hydrochlorothiazide     Chest tightness   Lisinopril Nausea Only   Losartan     Penicillins Hives   Tiotropium Bromide    Other reaction(s): Cough   Carvedilol         Medication List     TAKE these medications    acetaminophen  325 MG tablet Commonly known as: TYLENOL  Take 650 mg by mouth 3 (three) times daily as needed for mild pain (pain score 1-3).   albuterol  (2.5 MG/3ML) 0.083% nebulizer solution Commonly known as: PROVENTIL  Take 2.5 mg by nebulization every 4 (four) hours as needed for wheezing or shortness of breath.   albuterol  108 (90 Base) MCG/ACT inhaler Commonly known as: VENTOLIN  HFA Inhale 2 puffs into the lungs every 6 (six) hours as needed for wheezing or shortness of breath.   aspirin  81 MG chewable tablet Chew 81 mg by mouth daily.   azithromycin  250 MG tablet Commonly known as: ZITHROMAX  Take daily as directed   budesonide -formoterol  160-4.5 MCG/ACT inhaler Commonly known as: SYMBICORT  Inhale 2 puffs into the lungs 2 (two) times daily.   calcium  carbonate 500 MG chewable tablet Commonly known as: TUMS - dosed in mg elemental calcium  Chew 1 tablet by mouth daily. As needed   celecoxib  200 MG capsule Commonly known as: CELEBREX  Take 200 mg by mouth 2 (two) times daily.   escitalopram  10 MG tablet Commonly known as: LEXAPRO  Take 10 mg by mouth daily.   furosemide  20 MG tablet Commonly known as: LASIX  Take 1 tablet (20 mg total) by mouth daily.   lidocaine  5 % Commonly known as: LIDODERM  Place 1 patch onto the skin daily. Remove & Discard patch within 12 hours or as directed by MD   metoprolol  succinate 25 MG 24 hr tablet Commonly known as: TOPROL -XL Take 0.5 tablets (12.5 mg total) by mouth daily.   nicotine  21 mg/24hr patch Commonly known as: NICODERM CQ  - dosed in mg/24 hours Place 1 patch (21 mg total) onto the skin daily. Start taking on: September 03, 2024   predniSONE  20 MG tablet Commonly known as: DELTASONE  Take 1 tablet (20 mg total) by mouth daily with breakfast. Start taking on: September 03, 2024   pregabalin  150 MG capsule Commonly known as: LYRICA  Take 1 capsule (150 mg total) by mouth 2 (two) times daily.   spironolactone  25 MG tablet Commonly known as: ALDACTONE  Take 1 tablet (25 mg total) by mouth daily.   traMADol 50 MG tablet Commonly known as: ULTRAM Take 2 tablets (100 mg total) by mouth every 8 (eight) hours as needed for severe pain (pain score 7-10).        Follow-up Information     [facility]. Schedule an appointment as soon as possible for a visit today.   Contact information: [address] [place] [address] [phone]                 Filed Weights   08/30/24 0953  Weight: 77 kg     Condition at discharge: fair  The results of significant diagnostics from this hospitalization (including imaging,  microbiology, ancillary and laboratory) are listed below for reference.   Imaging Studies: DG Ribs Unilateral Left Result Date: 08/31/2024 EXAM: 1 VIEW XRAY OF THE LEFT RIBS 08/31/2024 10:49:00 AM COMPARISON: None available. CLINICAL HISTORY: Pain FINDINGS: BONES: Acute minimally displaced left anterolateral 8th and 9th rib fractures. Chronic left posterolateral 6th-8th rib fractures. LUNGS AND PLEURA: Visualized lungs demonstrate no acute abnormality. HEART AND MEDIASTINUM: Cardiomegaly. SOFT TISSUES: Surgical clips in neck. IMPRESSION: 1. Acute minimally displaced  left anterolateral 8th and 9th rib fractures. Electronically signed by: Norman Gatlin MD 08/31/2024 06:54 PM EST RP Workstation: [id number]   CT CHEST ABDOMEN PELVIS W CONTRAST Result Date: 08/30/2024 CLINICAL DATA:  Polytrauma, blunt, COPD exacerbation, chest wall pain, left upper quadrant abdominal pain EXAM: CT CHEST, ABDOMEN, AND PELVIS WITH CONTRAST TECHNIQUE: Multidetector CT imaging of the chest, abdomen and  pelvis was performed following the standard protocol during bolus administration of intravenous contrast. RADIATION DOSE REDUCTION: This exam was performed according to the departmental dose-optimization program which includes automated exposure control, adjustment of the mA and/or kV according to patient size and/or use of iterative reconstruction technique. CONTRAST:  OMNIPAQUE  IOHEXOL  300 MG/ML  SOLN COMPARISON:  08/30/2024, 10/03/2020 FINDINGS: CT CHEST FINDINGS Pulmonary Embolism: While the exam was not optimized for the evaluation of the pulmonary arteries, no central pulmonary embolism visualized. Cardiovascular: No cardiomegaly or pericardial effusion.No aortic aneurysm. Scattered calcified atherosclerosis throughout the aorta. Mediastinum/Nodes: No mediastinal mass.Heterogeneous, almost nodular enlargement of the thyroid  gland, unchanged. Surgical clips along the right inferior thyroid  gland. 1.1 cm precarinal lymph node, likely reactive. No axillary or hilar lymphadenopathy. Lungs/Pleura: Confluent centrilobular emphysema throughout the lungs, with an upper lobe predominance. The trachea and bronchi are otherwise patent. Posterior right lung apical subpleural nodule, measuring 6.6 mm, unchanged. Small region of dependent nodular consolidation and reticulation in the right lower lobe with apparent bronchiolectasis. Curvilinear reticulation in the antero basal left lower lobe with subpleural reticulation posteriorly, likely scarring and atelectasis. No pneumothorax or pleural effusion. CT ABDOMEN PELVIS FINDINGS Hepatobiliary: No mass.No radiopaque stones or wall thickening of the gallbladder. No intrahepatic or extrahepatic biliary ductal dilation. The portal veins are patent. Pancreas: No mass or main ductal dilation. No peripancreatic inflammation or fluid collection. Spleen: Normal size. No mass. Adrenals/Urinary Tract: No adrenal masses. Subcentimeter hypodensity noted in the right kidney, too  small to definitively characterize, but likely a small cyst. no nephrolithiasis or hydronephrosis. The urinary bladder is distended without focal abnormality. Stomach/Bowel: The stomach is decompressed without focal abnormality. No small bowel wall thickening or inflammation. No small bowel obstruction.Normal appendix. Scattered colonic diverticulosis. No changes of acute diverticulitis. Vascular/Lymphatic: No aortic aneurysm. Diffuse aortoiliac atherosclerosis. No intraabdominal or pelvic lymphadenopathy. Reproductive: Age-related atrophy of the uterus and ovaries. No concerning adnexal mass.No free pelvic fluid. Other: No pneumoperitoneum, ascites, or mesenteric inflammation. Musculoskeletal: No acute fracture or destructive lesion. Osteopenia. Multilevel degenerative disc disease of the spine. IMPRESSION: 1. No acute, traumatic injury within the chest, abdomen, and pelvis. 2. Confluent centrilobular emphysema throughout the lungs. Small region of dependent, nodular consolidation and reticulation in the right lower lobe with apparent bronchiolectasis, favored to represent atelectasis or scarring. Changes of aspiration or a developing bronchopneumonia could have this appearance in the correct clinical context. 3. Unchanged posterior right apical lung nodule measuring 6.6 mm, likely benign given the relative stability. 4. Similar appearance of the multinodular goiter. 5. Scattered colonic diverticulosis. No changes of acute diverticulitis. Aortic Atherosclerosis (ICD10-I70.0) and Emphysema (ICD10-J43.9). Electronically Signed   By: Rogelia  Carlean M.D.   On: 08/30/2024 13:04   DG Chest 1 View Result Date: 08/30/2024 EXAM: 1 VIEW(S) XRAY OF THE CHEST 08/30/2024 10:21:20 AM COMPARISON: 07/23/2024 CLINICAL HISTORY: rib pain anteriorly left FINDINGS: LINES, TUBES AND DEVICES: Vascular clips at base of neck. LUNGS AND PLEURA: Slightly more prominent nonspecific interstitial markings. No pleural effusion. No  pneumothorax. HEART AND MEDIASTINUM: Atherosclerotic plaque. No acute abnormality of the cardiac and mediastinal silhouettes. BONES AND SOFT TISSUES: Old healed left rib fractures. No acute osseous abnormality. IMPRESSION: 1. No acute cardiopulmonary process. 2. Similar prominence of interstitial markings likely related to COPD. Electronically signed by: Donnice Mania MD 08/30/2024 10:30 AM EST RP Workstation: [id number]   CT Cervical Spine Wo Contrast Result Date: 08/28/2024 EXAM: CT CERVICAL SPINE WITHOUT CONTRAST 08/28/2024 07:39:24 PM TECHNIQUE: CT of the cervical spine was performed without the administration of intravenous contrast. Multiplanar reformatted images are provided for review. Automated exposure control, iterative reconstruction, and/or weight based adjustment of the mA/kV was utilized to reduce the radiation dose to as low as reasonably achievable. COMPARISON: 09/10/2022. CLINICAL HISTORY: Recent fall with neck pain FINDINGS: CERVICAL SPINE: BONES AND ALIGNMENT: Stable straightening of the normal cervical lordosis is noted. No acute fracture or acute facet abnormality is noted. The odontoid is within normal limits. DEGENERATIVE CHANGES: Multilevel osteophytic change and facet hypertrophic changes are seen. SOFT TISSUES: Surrounding soft tissue structures show prominence of the left lobe of the thyroid  with a heterogeneous appearing nodule measuring up to 3.3 cm. This is stable from a prior ultrasound from 2022 and not felt to warrant biopsy. The lung apices demonstrate emphysematous changes. IMPRESSION: 1. Multilevel osteophytic change and facet hypertrophic changes without acute abnormality. . 2. Incidental emphysematous changes at the lung apices; for patients 64 years old, consider evaluation for a low-dose CT lung cancer screening program. Electronically signed by: Oneil Devonshire MD 08/28/2024 07:51 PM EST RP Workstation: [id number]   CT Head Wo Contrast Result Date: 08/28/2024 EXAM: CT  HEAD WITHOUT CONTRAST 08/28/2024 07:39:24 PM TECHNIQUE: CT of the head was performed without the administration of intravenous contrast. Automated exposure control, iterative reconstruction, and/or weight based adjustment of the mA/kV was utilized to reduce the radiation dose to as low as reasonably achievable. COMPARISON: 01/22/2006 CLINICAL HISTORY: Recent fall with headaches FINDINGS: BRAIN AND VENTRICLES: No acute hemorrhage. No evidence of acute infarct. No extra-axial collection. No mass effect or midline shift. ORBITS: No acute abnormality. SINUSES: No acute abnormality. SOFT TISSUES AND SKULL: No acute soft tissue abnormality. No skull fracture. IMPRESSION: 1. No acute intracranial abnormality. Electronically signed by: Oneil Devonshire MD 08/28/2024 07:48 PM EST RP Workstation: [id number]   DG Sacrum/Coccyx Result Date: 08/28/2024 CLINICAL DATA:  Clemens, pain EXAM: DG SACRUM/COCCYX 1-2+V COMPARISON:  None Available. FINDINGS: Frontal and lateral views of the sacrum and coccyx are obtained on 3 images. There are no acute displaced fractures. Moderate degenerative changes at the lumbosacral junction. The sacroiliac joints are unremarkable. IMPRESSION: 1. No acute displaced fracture. 2. Moderate degenerative changes of the lower lumbar spine. Electronically Signed   By: Ozell Daring M.D.   On: 08/28/2024 19:35   DG Tibia/Fibula Right Result Date: 08/28/2024 CLINICAL DATA:  Clemens, right shin pain EXAM: RIGHT TIBIA AND FIBULA - 2 VIEW COMPARISON:  None Available. FINDINGS: Frontal and lateral views of the right tibia and fibula are obtained on 4 images. There are no acute displaced fractures. Alignment is anatomic. Joint spaces are well preserved. There is subcutaneous edema involving the distal right lower leg. IMPRESSION:  1. Subcutaneous edema of the distal right lower leg. 2. No acute displaced fracture. Electronically Signed   By: Ozell Daring M.D.   On: 08/28/2024 19:34   DG Wrist Complete  Right Result Date: 08/28/2024 CLINICAL DATA:  Clemens, pain EXAM: RIGHT WRIST - COMPLETE 3+ VIEW COMPARISON:  02/22/2014 FINDINGS: Frontal, oblique, and lateral views of the right wrist are obtained. There is a comminuted intra-articular distal right radial fracture, with slight impaction and dorsal angulation at the fracture site. The radiocarpal joint remains intact. No other acute displaced fractures. There is eccentric lucency within the distal ulnar diaphysis, with surrounding sclerosis and associated periosteal reaction. The appearance is more suggestive of infection or neoplasm rather than sequela of trauma. Please correlate with clinical symptoms at this location, and further evaluation with nonemergent MRI may be useful for further characterization. There is diffuse soft tissue swelling of the wrist, greatest dorsally and laterally. IMPRESSION: 1. Acute comminuted intra-articular distal right radial fracture, with impaction and dorsal angulation. 2. Diffuse soft tissue swelling. 3. Eccentric cortical lucency involving the distal ulnar diaphysis, with surrounding sclerosis and evidence of periosteal reaction. This could be infectious or neoplastic, with sequela of prior trauma less likely. Further evaluation with nonemergent MRI may be useful. Electronically Signed   By: Ozell Daring M.D.   On: 08/28/2024 19:33    Microbiology: Results for orders placed or performed during the hospital encounter of 08/30/24  Resp panel by RT-PCR (RSV, Flu A&B, Covid) Anterior Nasal Swab     Status: None   Collection Time: 08/30/24 10:30 AM   Specimen: Anterior Nasal Swab  Result Value Ref Range Status   SARS Coronavirus 2 by RT PCR NEGATIVE NEGATIVE Final    Comment: (NOTE) SARS-CoV-2 target nucleic acids are NOT DETECTED.  The SARS-CoV-2 RNA is generally detectable in upper respiratory specimens during the acute phase of infection. The lowest concentration of SARS-CoV-2 viral copies this assay can detect  is 138 copies/mL. A negative result does not preclude SARS-Cov-2 infection and should not be used as the sole basis for treatment or other patient management decisions. A negative result may occur with  improper specimen collection/handling, submission of specimen other than nasopharyngeal swab, presence of viral mutation(s) within the areas targeted by this assay, and inadequate number of viral copies(<138 copies/mL). A negative result must be combined with clinical observations, patient history, and epidemiological information. The expected result is Negative.  Fact Sheet for Patients:  bloggercourse.com  Fact Sheet for Healthcare Providers:  seriousbroker.it  This test is no t yet approved or cleared by the [place]  FDA and  has been authorized for detection and/or diagnosis of SARS-CoV-2 by FDA under an Emergency Use Authorization (EUA). This EUA will remain  in effect (meaning this test can be used) for the duration of the COVID-19 declaration under Section 564(b)(1) of the Act, 21 U.S.C.section 360bbb-3(b)(1), unless the authorization is terminated  or revoked sooner.       Influenza A by PCR NEGATIVE NEGATIVE Final   Influenza B by PCR NEGATIVE NEGATIVE Final    Comment: (NOTE) The Xpert Xpress SARS-CoV-2/FLU/RSV plus assay is intended as an aid in the diagnosis of influenza from Nasopharyngeal swab specimens and should not be used as a sole basis for treatment. Nasal washings and aspirates are unacceptable for Xpert Xpress SARS-CoV-2/FLU/RSV testing.  Fact Sheet for Patients: bloggercourse.com  Fact Sheet for Healthcare Providers: seriousbroker.it  This test is not yet approved or cleared by the [place]  FDA and has been  authorized for detection and/or diagnosis of SARS-CoV-2 by FDA under an Emergency Use Authorization (EUA). This EUA will remain in effect  (meaning this test can be used) for the duration of the COVID-19 declaration under Section 564(b)(1) of the Act, 21 U.S.C. section 360bbb-3(b)(1), unless the authorization is terminated or revoked.     Resp Syncytial Virus by PCR NEGATIVE NEGATIVE Final    Comment: (NOTE) Fact Sheet for Patients: bloggercourse.com  Fact Sheet for Healthcare Providers: seriousbroker.it  This test is not yet approved or cleared by the [place]  FDA and has been authorized for detection and/or diagnosis of SARS-CoV-2 by FDA under an Emergency Use Authorization (EUA). This EUA will remain in effect (meaning this test can be used) for the duration of the COVID-19 declaration under Section 564(b)(1) of the Act, 21 U.S.C. section 360bbb-3(b)(1), unless the authorization is terminated or revoked.  Performed at [facility], [address] [place], [place] [address]   Respiratory (~20 pathogens) panel by PCR     Status: None   Collection Time: 08/30/24  5:15 PM   Specimen: Nasopharyngeal Swab; Respiratory  Result Value Ref Range Status   Adenovirus NOT DETECTED NOT DETECTED Final   Coronavirus 229E NOT DETECTED NOT DETECTED Final    Comment: (NOTE) The Coronavirus on the Respiratory Panel, DOES NOT test for the novel  Coronavirus (2019 nCoV)    Coronavirus HKU1 NOT DETECTED NOT DETECTED Final   Coronavirus NL63 NOT DETECTED NOT DETECTED Final   Coronavirus OC43 NOT DETECTED NOT DETECTED Final   Metapneumovirus NOT DETECTED NOT DETECTED Final   Rhinovirus / Enterovirus NOT DETECTED NOT DETECTED Final   Influenza A NOT DETECTED NOT DETECTED Final   Influenza B NOT DETECTED NOT DETECTED Final   Parainfluenza Virus 1 NOT DETECTED NOT DETECTED Final   Parainfluenza Virus 2 NOT DETECTED NOT DETECTED Final   Parainfluenza Virus 3 NOT DETECTED NOT DETECTED Final   Parainfluenza Virus 4 NOT DETECTED NOT DETECTED Final   Respiratory Syncytial  Virus NOT DETECTED NOT DETECTED Final   Bordetella pertussis NOT DETECTED NOT DETECTED Final   Bordetella Parapertussis NOT DETECTED NOT DETECTED Final   Chlamydophila pneumoniae NOT DETECTED NOT DETECTED Final   Mycoplasma pneumoniae NOT DETECTED NOT DETECTED Final    Comment: Performed at [facility], [address] [place], [place] [address]    Labs: CBC: Recent Labs  Lab 08/30/24 1046  WBC 11.9*  NEUTROABS 9.9*  HGB 12.4  HCT 40.9  MCV 90.5  PLT 215   Basic Metabolic Panel: Recent Labs  Lab 08/30/24 1046 09/01/24 1004  NA 142 143  K 4.3 4.9  CL 105 108  CO2 25 28  GLUCOSE 101* 107*  BUN 14 24*  CREATININE 0.75 0.80  CALCIUM  9.6 8.6*    Discharge time spent: greater than 30 minutes.  Signed: Leita Blanch, MD [facility] 09/02/2024

## 2024-09-02 NOTE — Progress Notes (Signed)
 Physical Therapy Treatment Patient Details Name: Cindy Gutierrez MRN: [id number] DOB: 13-May-1961 Today's Date: 09/02/2024   History of Present Illness Cindy Gutierrez is a 63 y.o. female with medical history significant of HFrEF, CAD, Pulmonary HTN, neuropathy, COPD/chronic hypoxic respiratory failure on 2 L nasal cannula presents to the emergency department for evaluation of nonradiating, constant, left-sided rib pain and shortness of breath that started last night.  Pain is worse with breathing, movement, and palpation.  She also reports nonproductive cough and fever. Of note, she fell backwards onto her bottom while standing on a chair trying to change a light bulb on Wednesday.  She was evaluated in the emergency department at that time and was found to have a right wrist fracture which was splinted.  She reports her pain from the fall is uncontrolled, and she is having difficulty functioning at home.  No focal weakness, numbness, tingling.    PT Comments  Patient seen for PT session focused on functional mobility. Main limiting factors today were unsteadiness and motivation for progressive mobility. Pt educated on the importance and safety concerns for use of SPC; pt declined. Interventions aimed at improving functional mobility. Patient shows good potential to make progress with continued acute level rehab. Patient continues to demonstrate moderate activity restrictions and poor tolerance for progressive mobility. Continued skilled PT recommended to progress toward functional goals and support discharge readiness. Pt making good progress toward goals, will continue to follow POC. Discharge recommendation remains appropriate      If plan is discharge home, recommend the following: A little help with walking and/or transfers;A little help with bathing/dressing/bathroom;Help with stairs or ramp for entrance;Assist for transportation   Can travel by private vehicle        Equipment Recommendations   Cane    Recommendations for Other Services       Precautions / Restrictions Precautions Precautions: Fall Recall of Precautions/Restrictions: Intact Required Braces or Orthoses: Splint/Cast Splint/Cast: R UE Restrictions Weight Bearing Restrictions Per Provider Order: Yes Other Position/Activity Restrictions: none in chart, but treated as NWB     Mobility  Bed Mobility Overal bed mobility: Modified Independent                  Transfers Overall transfer level: Needs assistance Equipment used: 1 person hand held assist Transfers: Sit to/from Stand Sit to Stand: Min assist           General transfer comment: requires cues to scoot to edge of bed, required more than one attempt to power up to standing; very unsteady upon initial standing    Ambulation/Gait Ambulation/Gait assistance: Contact guard assist, Min assist Gait Distance (Feet): 30 Feet Assistive device: 1 person hand held assist Gait Pattern/deviations: Step-through pattern, Decreased step length - right, Decreased step length - left, Decreased stride length Gait velocity: decr     General Gait Details: hesitancy and min A needed with ambulation. and very unsteady destat to 78% 2 L/min. applied pt recovered to 90%   Stairs             Wheelchair Mobility     Tilt Bed    Modified Rankin (Stroke Patients Only)       Balance Overall balance assessment: Needs assistance Sitting-balance support: Feet supported Sitting balance-Leahy Scale: Good     Standing balance support: Single extremity supported, During functional activity, Reliant on assistive device for balance Standing balance-Leahy Scale: Fair  Communication Communication Communication: No apparent difficulties  Cognition Arousal: Alert Behavior During Therapy: WFL for tasks assessed/performed                             Following commands: Intact      Cueing Cueing  Techniques: Verbal cues  Exercises      General Comments        Pertinent Vitals/Pain Pain Assessment Pain Assessment: No/denies pain    Home Living                          Prior Function            PT Goals (current goals can now be found in the care plan section) Acute Rehab PT Goals Patient Stated Goal: return home PT Goal Formulation: With patient Time For Goal Achievement: 09/13/24 Potential to Achieve Goals: Good Progress towards PT goals: Progressing toward goals    Frequency    Min 2X/week      PT Plan      Co-evaluation              AM-PAC PT 6 Clicks Mobility   Outcome Measure  Help needed turning from your back to your side while in a flat bed without using bedrails?: None Help needed moving from lying on your back to sitting on the side of a flat bed without using bedrails?: None Help needed moving to and from a bed to a chair (including a wheelchair)?: A Little Help needed standing up from a chair using your arms (e.g., wheelchair or bedside chair)?: A Lot Help needed to walk in hospital room?: A Little Help needed climbing 3-5 steps with a railing? : A Lot 6 Click Score: 18    End of Session Equipment Utilized During Treatment: Oxygen Activity Tolerance: Patient limited by fatigue;Other (comment) Patient left: in bed;with call bell/phone within reach;with bed alarm set Nurse Communication: Mobility status PT Visit Diagnosis: Other abnormalities of gait and mobility (R26.89);Unsteadiness on feet (R26.81);History of falling (Z91.81);Difficulty in walking, not elsewhere classified (R26.2);Muscle weakness (generalized) (M62.81)     Time: 8956-8944 PT Time Calculation (min) (ACUTE ONLY): 12 min  Charges:    $Therapeutic Activity: 8-22 mins PT General Charges $$ ACUTE PT VISIT: 1 Visit                     Sherlean Lesches DPT, PT     Sherlean A Dillion Stowers 09/02/2024, 11:22 AM

## 2024-09-05 ENCOUNTER — Other Ambulatory Visit: Payer: Self-pay

## 2024-09-05 DIAGNOSIS — Z1231 Encounter for screening mammogram for malignant neoplasm of breast: Secondary | ICD-10-CM

## 2024-10-14 ENCOUNTER — Inpatient Hospital Stay: Admission: RE | Admit: 2024-10-14 | Discharge: 2024-10-14 | Disposition: A | Payer: Self-pay | Source: Ambulatory Visit

## 2024-10-14 ENCOUNTER — Other Ambulatory Visit: Payer: Self-pay | Admitting: *Deleted

## 2024-10-14 DIAGNOSIS — Z1231 Encounter for screening mammogram for malignant neoplasm of breast: Secondary | ICD-10-CM

## 2024-11-01 ENCOUNTER — Other Ambulatory Visit: Payer: Self-pay | Admitting: Internal Medicine

## 2024-11-01 DIAGNOSIS — Z1231 Encounter for screening mammogram for malignant neoplasm of breast: Secondary | ICD-10-CM

## 2024-11-01 DIAGNOSIS — N63 Unspecified lump in unspecified breast: Secondary | ICD-10-CM

## 2024-11-01 DIAGNOSIS — N6489 Other specified disorders of breast: Secondary | ICD-10-CM

## 2024-11-11 ENCOUNTER — Other Ambulatory Visit

## 2024-11-11 ENCOUNTER — Inpatient Hospital Stay: Admission: RE | Admit: 2024-11-11
